# Patient Record
Sex: Male | Born: 1945
Health system: Southern US, Community
[De-identification: ages and names within clinical notes are randomized; demographics above are authoritative.]

## PROBLEM LIST (undated history)

## (undated) DIAGNOSIS — I10 Essential (primary) hypertension: Secondary | ICD-10-CM

## (undated) DIAGNOSIS — R079 Chest pain, unspecified: Secondary | ICD-10-CM

## (undated) DIAGNOSIS — E119 Type 2 diabetes mellitus without complications: Secondary | ICD-10-CM

## (undated) DIAGNOSIS — N529 Male erectile dysfunction, unspecified: Secondary | ICD-10-CM

## (undated) HISTORY — PX: ROTATOR CUFF REPAIR: SHX139

## (undated) HISTORY — DX: Essential (primary) hypertension: I10

## (undated) HISTORY — DX: Chest pain, unspecified: R07.9

## (undated) HISTORY — DX: Male erectile dysfunction, unspecified: N52.9

---
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
## 2011-11-28 DIAGNOSIS — E782 Mixed hyperlipidemia: Secondary | ICD-10-CM | POA: Insufficient documentation

## 2011-11-28 DIAGNOSIS — I10 Essential (primary) hypertension: Secondary | ICD-10-CM | POA: Insufficient documentation

## 2011-11-28 DIAGNOSIS — E1169 Type 2 diabetes mellitus with other specified complication: Secondary | ICD-10-CM | POA: Insufficient documentation

## 2011-11-28 DIAGNOSIS — N529 Male erectile dysfunction, unspecified: Secondary | ICD-10-CM | POA: Insufficient documentation

## 2011-11-28 DIAGNOSIS — E785 Hyperlipidemia, unspecified: Secondary | ICD-10-CM | POA: Insufficient documentation

## 2013-10-21 LAB — HM COLONOSCOPY

## 2013-10-21 LAB — FECAL OCCULT BLOOD, GUAIAC: FECAL OCCULT BLD: NEGATIVE

## 2014-02-04 DIAGNOSIS — Z9181 History of falling: Secondary | ICD-10-CM | POA: Diagnosis not present

## 2014-02-04 DIAGNOSIS — Z1389 Encounter for screening for other disorder: Secondary | ICD-10-CM | POA: Diagnosis not present

## 2014-02-04 DIAGNOSIS — E119 Type 2 diabetes mellitus without complications: Secondary | ICD-10-CM | POA: Diagnosis not present

## 2014-02-04 DIAGNOSIS — Z7189 Other specified counseling: Secondary | ICD-10-CM | POA: Diagnosis not present

## 2014-02-04 DIAGNOSIS — N529 Male erectile dysfunction, unspecified: Secondary | ICD-10-CM | POA: Diagnosis not present

## 2014-03-03 DIAGNOSIS — I10 Essential (primary) hypertension: Secondary | ICD-10-CM | POA: Diagnosis not present

## 2014-03-18 DIAGNOSIS — R079 Chest pain, unspecified: Secondary | ICD-10-CM | POA: Diagnosis not present

## 2014-03-18 DIAGNOSIS — E782 Mixed hyperlipidemia: Secondary | ICD-10-CM | POA: Diagnosis not present

## 2014-03-18 DIAGNOSIS — E119 Type 2 diabetes mellitus without complications: Secondary | ICD-10-CM | POA: Diagnosis not present

## 2014-03-18 DIAGNOSIS — I10 Essential (primary) hypertension: Secondary | ICD-10-CM | POA: Diagnosis not present

## 2014-03-18 LAB — HEMOGLOBIN A1C: HEMOGLOBIN A1C: 8.9 % — AB (ref 4.0–6.0)

## 2014-03-23 ENCOUNTER — Encounter: Payer: Self-pay | Admitting: *Deleted

## 2014-03-24 ENCOUNTER — Ambulatory Visit: Payer: Self-pay | Admitting: Cardiovascular Disease

## 2014-06-23 ENCOUNTER — Ambulatory Visit: Payer: Self-pay | Admitting: Family Medicine

## 2014-07-14 ENCOUNTER — Telehealth: Payer: Self-pay | Admitting: Family Medicine

## 2014-07-14 DIAGNOSIS — E11329 Type 2 diabetes mellitus with mild nonproliferative diabetic retinopathy without macular edema: Secondary | ICD-10-CM | POA: Diagnosis not present

## 2014-07-19 ENCOUNTER — Ambulatory Visit (INDEPENDENT_AMBULATORY_CARE_PROVIDER_SITE_OTHER): Payer: Commercial Managed Care - HMO | Admitting: Family Medicine

## 2014-07-19 ENCOUNTER — Encounter: Payer: Self-pay | Admitting: Family Medicine

## 2014-07-19 VITALS — BP 139/73 | HR 74 | Temp 98.7°F | Resp 16 | Ht 71.0 in | Wt 201.8 lb

## 2014-07-19 DIAGNOSIS — E1165 Type 2 diabetes mellitus with hyperglycemia: Secondary | ICD-10-CM

## 2014-07-19 DIAGNOSIS — E1169 Type 2 diabetes mellitus with other specified complication: Secondary | ICD-10-CM | POA: Insufficient documentation

## 2014-07-19 DIAGNOSIS — I10 Essential (primary) hypertension: Secondary | ICD-10-CM | POA: Diagnosis not present

## 2014-07-19 DIAGNOSIS — IMO0002 Reserved for concepts with insufficient information to code with codable children: Secondary | ICD-10-CM

## 2014-07-19 DIAGNOSIS — E11319 Type 2 diabetes mellitus with unspecified diabetic retinopathy without macular edema: Secondary | ICD-10-CM | POA: Insufficient documentation

## 2014-07-19 DIAGNOSIS — N528 Other male erectile dysfunction: Secondary | ICD-10-CM | POA: Diagnosis not present

## 2014-07-19 DIAGNOSIS — N529 Male erectile dysfunction, unspecified: Secondary | ICD-10-CM

## 2014-07-19 DIAGNOSIS — E785 Hyperlipidemia, unspecified: Secondary | ICD-10-CM

## 2014-07-19 LAB — POCT GLYCOSYLATED HEMOGLOBIN (HGB A1C): Hemoglobin A1C: 9.3

## 2014-07-19 MED ORDER — SILDENAFIL CITRATE 20 MG PO TABS: 20.0000 mg | ORAL_TABLET | Freq: Three times a day (TID) | ORAL | Status: DC

## 2014-07-19 MED ORDER — METFORMIN HCL 1000 MG PO TABS: 1000.0000 mg | ORAL_TABLET | Freq: Two times a day (BID) | ORAL | Status: DC

## 2014-07-19 MED ORDER — PRAVASTATIN SODIUM 10 MG PO TABS: 10.0000 mg | ORAL_TABLET | Freq: Every day | ORAL | Status: DC

## 2014-07-19 MED ORDER — LISINOPRIL-HYDROCHLOROTHIAZIDE 20-12.5 MG PO TABS: 1.0000 | ORAL_TABLET | Freq: Every day | ORAL | Status: DC

## 2014-07-19 MED ORDER — SITAGLIPTIN PHOSPHATE 100 MG PO TABS: 100.0000 mg | ORAL_TABLET | Freq: Every day | ORAL | Status: DC

## 2014-07-19 NOTE — Assessment & Plan Note (Signed)
Pt requesting generic sildenafil. Pt denies CP or shortness of breath. Pt is aware to alert HCP to viagra use if he experiences chest pain.

## 2014-07-19 NOTE — Assessment & Plan Note (Signed)
Check lipid panel today 

## 2014-07-19 NOTE — Progress Notes (Signed)
Subjective:    Patient ID: [name], male    DOB: 1945/09/04, 69 y.o.   MRN: [id number]  HPI: [name] is a 69 y.o. male presenting on 07/19/2014 for Diabetes; Hypertension; and Hyperlipidemia   Diabetes He presents for his follow-up diabetic visit. He has type 2 diabetes mellitus. His disease course has been worsening. There are no hypoglycemic associated symptoms. Pertinent negatives for hypoglycemia include no headaches. Pertinent negatives for diabetes include no blurred vision, no chest pain, no foot paresthesias, no polydipsia, no polyphagia, no polyuria and no visual change. There are no hypoglycemic complications. He is compliant with treatment most of the time. He has not had a previous visit with a dietitian. He monitors blood glucose at home 3-4 x per week. His overall blood glucose range is 140-180 mg/dl. An ACE inhibitor/angiotensin II receptor blocker is being taken. He does not see a podiatrist.Eye exam is current.  Hypertension This is a chronic problem. The problem is controlled. Pertinent negatives include no blurred vision, chest pain, headaches, palpitations, peripheral edema or shortness of breath. Risk factors for coronary artery disease include male gender, diabetes mellitus and dyslipidemia. Past treatments include ACE inhibitors and diuretics. The current treatment provides moderate improvement.  Hyperlipidemia This is a chronic problem. Exacerbating diseases include diabetes. Pertinent negatives include no chest pain, leg pain, myalgias or shortness of breath. Risk factors for coronary artery disease include diabetes mellitus.    Past Medical History  Diagnosis Date  . Hyperlipidemia   . Diabetes mellitus without complication   . Hypertension   . Right-sided chest pain   . Erectile dysfunction     No current outpatient prescriptions on file prior to visit.   No current facility-administered medications on file prior to visit.    Review of Systems   Constitutional: Negative for fever and chills.  Eyes: Negative for blurred vision.  Respiratory: Negative for chest tightness, shortness of breath and wheezing.   Cardiovascular: Negative for chest pain and palpitations.  Gastrointestinal: Negative.   Endocrine: Negative for cold intolerance, heat intolerance, polydipsia, polyphagia and polyuria.  Musculoskeletal: Negative for myalgias.  Neurological: Negative for light-headedness, numbness and headaches.  Psychiatric/Behavioral: Negative.    Per HPI unless specifically indicated above     Objective:    BP 139/73 mmHg  Pulse 74  Temp(Src) 98.7 F (37.1 C) (Oral)  Resp 16  Ht 5\' 11"  (1.803 m)  Wt 201 lb 12.8 oz (91.536 kg)  BMI 28.16 kg/m2  Wt Readings from Last 3 Encounters:  07/19/14 201 lb 12.8 oz (91.536 kg)  03/18/14 204 lb 8 oz (92.761 kg)    Physical Exam  Constitutional: He is oriented to person, place, and time. He appears well-developed and well-nourished. No distress.  Neck: Normal range of motion. Neck supple. No thyromegaly present.  Cardiovascular: Normal rate and regular rhythm.  Exam reveals no gallop and no friction rub.   No murmur heard. Pulmonary/Chest: Effort normal and breath sounds normal.  Abdominal: Soft. Bowel sounds are normal. There is no tenderness. There is no rebound.  Musculoskeletal: Normal range of motion. He exhibits no edema or tenderness.  Lymphadenopathy:    He has no cervical adenopathy.  Neurological: He is alert and oriented to person, place, and time.  Skin: Skin is warm and dry. He is not diaphoretic.   Diabetic Foot Exam - Simple   Simple Foot Form  Diabetic Foot exam was performed with the following findings:  Yes 07/19/2014  4:09 PM  Visual Inspection  No deformities, no ulcerations, no other skin breakdown bilaterally:  Yes  Sensation Testing  Intact to touch and monofilament testing bilaterally:  Yes  Pulse Check  Posterior Tibialis and Dorsalis pulse intact bilaterally:   Yes  Comments      Results for orders placed or performed in visit on 07/19/14  POCT HgB A1C  Result Value Ref Range   Hemoglobin A1C 9.3       Assessment & Plan:   Problem List Items Addressed This Visit      Cardiovascular and Mediastinum   BP (high blood pressure)    Controlled. Continue current regimen. DASH diet reviewed.       Relevant Medications   lisinopril-hydrochlorothiazide (PRINZIDE,ZESTORETIC) 20-12.5 MG per tablet   pravastatin (PRAVACHOL) 10 MG tablet   sildenafil (REVATIO) 20 MG tablet   Other Relevant Orders   Comprehensive Metabolic Panel (CMET)     Genitourinary   ED (erectile dysfunction) of organic origin    Pt requesting generic sildenafil. Pt denies CP or shortness of breath. Pt is aware to alert HCP to viagra use if he experiences chest pain.       Relevant Medications   sildenafil (REVATIO) 20 MG tablet     Other   HLD (hyperlipidemia)    Check lipid panel today.       Relevant Medications   lisinopril-hydrochlorothiazide (PRINZIDE,ZESTORETIC) 20-12.5 MG per tablet   pravastatin (PRAVACHOL) 10 MG tablet   sildenafil (REVATIO) 20 MG tablet   Other Relevant Orders   Lipid Profile   DM (diabetes mellitus), type 2, uncontrolled - Primary    Stop glimepiride, restart Januvia. Pt was controlled in the past. Check BG 2-3 times weekly to gain feedback on how sugars are doing. Encouraged continued lifestyle and diet modifications. Pt had eye exam last week- he will sign records release for the office.  Foot exam done. Check CMP and urine micro      Relevant Medications   lisinopril-hydrochlorothiazide (PRINZIDE,ZESTORETIC) 20-12.5 MG per tablet   metFORMIN (GLUCOPHAGE) 1000 MG tablet   pravastatin (PRAVACHOL) 10 MG tablet   sitaGLIPtin (JANUVIA) 100 MG tablet   Other Relevant Orders   POCT HgB A1C (Completed)   Comprehensive Metabolic Panel (CMET)   Urine Microalbumin w/creat. ratio      Meds ordered this encounter  Medications  .  DISCONTD: naproxen (NAPROSYN) 500 MG tablet    Sig: Take by mouth.  . selenium sulfide (SELSUN) 2.5 % shampoo    Sig: Apply topically.  [name] DISCONTD: glimepiride (AMARYL) 1 MG tablet    Sig: Take by mouth.  . DISCONTD: metFORMIN (GLUCOPHAGE) 1000 MG tablet    Sig: Take by mouth.  . DISCONTD: pravastatin (PRAVACHOL) 40 MG tablet    Sig: Take by mouth.  . DISCONTD: lisinopril-hydrochlorothiazide (PRINZIDE,ZESTORETIC) 10-12.5 MG per tablet    Sig: Take 1 tablet by mouth.  [name] lisinopril-hydrochlorothiazide (PRINZIDE,ZESTORETIC) 20-12.5 MG per tablet    Sig: Take 1 tablet by mouth daily.    Dispense:  90 tablet    Refill:  3    Order Specific Question:  Supervising Provider    Answer:  Janeann Forehand [phone]  . metFORMIN (GLUCOPHAGE) 1000 MG tablet    Sig: Take 1 tablet (1,000 mg total) by mouth 2 (two) times daily with a meal.    Dispense:  90 tablet    Refill:  3    Order Specific Question:  Supervising Provider    Answer:  Janeann Forehand [phone]  .  pravastatin (PRAVACHOL) 10 MG tablet    Sig: Take 1 tablet (10 mg total) by mouth daily.    Dispense:  90 tablet    Refill:  3    Order Specific Question:  Supervising Provider    Answer:  Janeann Forehand [phone]  . sitaGLIPtin (JANUVIA) 100 MG tablet    Sig: Take 1 tablet (100 mg total) by mouth daily.    Dispense:  90 tablet    Refill:  3    Order Specific Question:  Supervising Provider    Answer:  Janeann Forehand [phone]  . sildenafil (REVATIO) 20 MG tablet    Sig: Take 1 tablet (20 mg total) by mouth 3 (three) times daily. Take 1-5 tablets as needed 30 minutes prior to sex.    Dispense:  30 tablet    Refill:  11    Order Specific Question:  Supervising Provider    Answer:  Janeann Forehand [phone]      Follow up plan: Return in about 20 weeks (around 12/06/2014).

## 2014-07-19 NOTE — Assessment & Plan Note (Signed)
Controlled. Continue current regimen. DASH diet reviewed.

## 2014-07-19 NOTE — Patient Instructions (Signed)
Please check your blood glucose Once  daily. If your glucose is < 70 mg/dl or you have symptoms of hypoglycemia confusion, dizziness, jitteriness and sweating please drink 4 oz of juice or soda.  Check blood glucose 15 minutes later. If it has not risen to >100, please seek medical attention. If > 100 please eat a snack containing protein such as peanut butter and crackers.  Please make diet changes to control DM. Watch the carbs in your diet- reduce breads, rice, pasta. Avoid sugar sweetened beverages.  Your goal blood pressure is 140/90. Work on low salt/sodium diet - goal <1.5gm (1,500mg ) per day. Eat a diet high in fruits/vegetables and whole grains.  Look into mediterranean and DASH diet. Goal activity is 16750min/wk of moderate intensity exercise.  This can be split into 30 minute chunks.  If you are not at this level, you can start with smaller 10-15 min increments and slowly build up activity. Look at www.heart.org for more resources

## 2014-07-19 NOTE — Assessment & Plan Note (Signed)
Stop glimepiride, restart Januvia. Pt was controlled in the past. Check BG 2-3 times weekly to gain feedback on how sugars are doing. Encouraged continued lifestyle and diet modifications. Pt had eye exam last week- he will sign records release for the office.  Foot exam done. Check CMP and urine micro

## 2014-08-04 ENCOUNTER — Telehealth: Payer: Self-pay | Admitting: Family Medicine

## 2014-08-04 DIAGNOSIS — IMO0002 Reserved for concepts with insufficient information to code with codable children: Secondary | ICD-10-CM

## 2014-08-04 DIAGNOSIS — E1165 Type 2 diabetes mellitus with hyperglycemia: Secondary | ICD-10-CM

## 2014-08-04 MED ORDER — ONETOUCH DELICA LANCETS 33G MISC: 1.0000 | Freq: Three times a day (TID) | Status: DC

## 2014-08-04 MED ORDER — GLUCOSE BLOOD VI STRP: ORAL_STRIP | Status: DC

## 2014-08-04 NOTE — Telephone Encounter (Signed)
Called pt to inform him that strips and lancets were sent to his pharmacy. The Glimepiride  was d/c'ed at his last visit.

## 2014-11-02 NOTE — Telephone Encounter (Signed)
Error

## 2014-11-14 ENCOUNTER — Other Ambulatory Visit: Payer: Self-pay | Admitting: Family Medicine

## 2014-11-14 DIAGNOSIS — IMO0001 Reserved for inherently not codable concepts without codable children: Secondary | ICD-10-CM

## 2014-11-14 DIAGNOSIS — E1165 Type 2 diabetes mellitus with hyperglycemia: Principal | ICD-10-CM

## 2014-11-14 MED ORDER — METFORMIN HCL 1000 MG PO TABS: 1000.0000 mg | ORAL_TABLET | Freq: Two times a day (BID) | ORAL | Status: DC

## 2014-12-08 ENCOUNTER — Ambulatory Visit (INDEPENDENT_AMBULATORY_CARE_PROVIDER_SITE_OTHER): Payer: Commercial Managed Care - HMO | Admitting: Family Medicine

## 2014-12-08 ENCOUNTER — Encounter: Payer: Self-pay | Admitting: Family Medicine

## 2014-12-08 VITALS — BP 130/72 | HR 76 | Temp 98.2°F | Resp 16 | Ht 71.0 in | Wt 200.6 lb

## 2014-12-08 DIAGNOSIS — E119 Type 2 diabetes mellitus without complications: Secondary | ICD-10-CM | POA: Diagnosis not present

## 2014-12-08 DIAGNOSIS — E785 Hyperlipidemia, unspecified: Secondary | ICD-10-CM

## 2014-12-08 DIAGNOSIS — IMO0001 Reserved for inherently not codable concepts without codable children: Secondary | ICD-10-CM

## 2014-12-08 DIAGNOSIS — I10 Essential (primary) hypertension: Secondary | ICD-10-CM

## 2014-12-08 DIAGNOSIS — Z23 Encounter for immunization: Secondary | ICD-10-CM

## 2014-12-08 DIAGNOSIS — N528 Other male erectile dysfunction: Secondary | ICD-10-CM | POA: Diagnosis not present

## 2014-12-08 DIAGNOSIS — E1165 Type 2 diabetes mellitus with hyperglycemia: Secondary | ICD-10-CM | POA: Diagnosis not present

## 2014-12-08 DIAGNOSIS — E1169 Type 2 diabetes mellitus with other specified complication: Secondary | ICD-10-CM

## 2014-12-08 DIAGNOSIS — N529 Male erectile dysfunction, unspecified: Secondary | ICD-10-CM

## 2014-12-08 LAB — POCT GLYCOSYLATED HEMOGLOBIN (HGB A1C): HEMOGLOBIN A1C: 7.5

## 2014-12-08 MED ORDER — METFORMIN HCL 1000 MG PO TABS: 1000.0000 mg | ORAL_TABLET | Freq: Two times a day (BID) | ORAL | Status: DC

## 2014-12-08 NOTE — Assessment & Plan Note (Signed)
Controlled in office today. DASH diet reviewed.  CMP ordered. ACE for renal protection.  RTC 3 mos.

## 2014-12-08 NOTE — Assessment & Plan Note (Signed)
Continue sildenafil PRN. 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

## 2014-12-08 NOTE — Assessment & Plan Note (Signed)
Lipid panel ordered today.  Encouraged diet and lifestyle changes.

## 2014-12-08 NOTE — Patient Instructions (Signed)
Keep up the good work with your diabetes!  Your A1c is down a lot today. We will re-check in 3 mos.   Your goal blood pressure is 140/90 Work on low salt/sodium diet - goal <2.5gm (2,500mg ) per day. Eat a diet high in fruits/vegetables and whole grains.  Look into mediterranean and DASH diet. Goal activity is 14950min/wk of moderate intensity exercise.  This can be split into 30 minute chunks.  If you are not at this level, you can start with smaller 10-15 min increments and slowly build up activity. Look at www.heart.org for more resources

## 2014-12-08 NOTE — Assessment & Plan Note (Signed)
A1c decreased to 7.5%. Continue Januiva. Continue diet and lifestyle changes. Encouraged regular exercise.  Eye exam UTD. Foot Exam UTD.

## 2014-12-08 NOTE — Progress Notes (Signed)
Subjective:    Patient ID: [name], male    DOB: 09/03/1945, 69 y.o.   MRN: [id number]  HPI: [name] is a 69 y.o. male presenting on 12/08/2014 for Diabetes; Hyperlipidemia; and Hypertension   Diabetes He presents for his follow-up diabetic visit. He has type 2 diabetes mellitus. His disease course has been worsening. There are no hypoglycemic associated symptoms. Pertinent negatives for hypoglycemia include no headaches. Pertinent negatives for diabetes include no chest pain, no fatigue, no foot paresthesias, no polydipsia, no polyphagia, no polyuria and no weakness. There are no diabetic complications. He has not had a previous visit with a dietitian. He participates in exercise intermittently. His overall blood glucose range is 110-130 mg/dl. An ACE inhibitor/angiotensin II receptor blocker is being taken. He does not see a podiatrist.Eye exam is current.    Pt presents for DM follow-up.  CHanged from glimepiride to Januvia last visit for increasing A1c. Not really exercising. 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     Past Medical History  Diagnosis Date  . Hyperlipidemia   . Diabetes mellitus without complication (HCC)   . Hypertension   . Right-sided chest pain   . Erectile dysfunction     Current Outpatient Prescriptions on File Prior to Visit  Medication Sig  . glucose blood test strip Please check blood glucose twice daily  . lisinopril-hydrochlorothiazide (PRINZIDE,ZESTORETIC) 20-12.5 MG per tablet Take 1 tablet by mouth daily.  Letta Pate. ONETOUCH DELICA LANCETS 33G MISC 1 each by Does not apply route 3 (three) times daily.  . pravastatin (PRAVACHOL) 10 MG tablet Take 1 tablet (10 mg total) by mouth daily.  [name]. selenium sulfide (SELSUN) 2.5 % shampoo Apply topically.  . sildenafil (REVATIO) 20 MG tablet Take 1 tablet (20 mg total) by mouth 3 (three) times daily. Take 1-5 tablets as needed 30 minutes prior to sex.  . sitaGLIPtin (JANUVIA) 100 MG tablet Take 1 tablet (100 mg  total) by mouth daily.   No current facility-administered medications on file prior to visit.    Review of Systems  Constitutional: Negative for fever, chills and fatigue.  Respiratory: Negative for chest tightness, shortness of breath and wheezing.   Cardiovascular: Negative for chest pain.  Gastrointestinal: Negative.   Endocrine: Negative for cold intolerance, heat intolerance, polydipsia, polyphagia and polyuria.  Neurological: Negative for weakness, light-headedness, numbness and headaches.  Psychiatric/Behavioral: Negative.    Per HPI unless specifically indicated above     Objective:    BP 130/72 mmHg  Pulse 76  Temp(Src) 98.2 F (36.8 C) (Oral)  Resp 16  Ht 5\' 11"  (1.803 m)  Wt 200 lb 9.6 oz (90.992 kg)  BMI 27.99 kg/m2  Wt Readings from Last 3 Encounters:  12/08/14 200 lb 9.6 oz (90.992 kg)  07/19/14 201 lb 12.8 oz (91.536 kg)  03/18/14 204 lb 8 oz (92.761 kg)    Physical Exam  Constitutional: He is oriented to person, place, and time. He appears well-developed and well-nourished. No distress.  Neck: Normal range of motion. Neck supple. No thyromegaly present.  Cardiovascular: Normal rate and regular rhythm.  Exam reveals no gallop and no friction rub.   No murmur heard. Pulmonary/Chest: Effort normal and breath sounds normal.  Abdominal: Soft. Bowel sounds are normal. There is no tenderness. There is no rebound.  Musculoskeletal: Normal range of motion. He exhibits no edema or tenderness.  Lymphadenopathy:    He has no cervical adenopathy.  Neurological: He is alert and oriented to person, place, and time.  Skin: Skin is warm and dry. He is not diaphoretic.  Skin tag on upper bag. Benign. No bleeding or irritation.    Results for orders placed or performed in visit on 12/08/14  POCT HgB A1C  Result Value Ref Range   Hemoglobin A1C 7.5       Assessment & Plan:   Problem List Items Addressed This Visit      Cardiovascular and Mediastinum   BP (high  blood pressure)    Controlled in office today. DASH diet reviewed.  CMP ordered. ACE for renal protection.  RTC 3 mos.         Endocrine   DM (diabetes mellitus), type 2, uncontrolled (HCC)   Relevant Medications   metFORMIN (GLUCOPHAGE) 1000 MG tablet   Diabetes mellitus (HCC) - Primary    A1c decreased to 7.5%. Continue Januiva. Continue diet and lifestyle changes. Encouraged regular exercise.  Eye exam UTD. Foot Exam UTD.        Relevant Medications   metFORMIN (GLUCOPHAGE) 1000 MG tablet   Other Relevant Orders   Comprehensive metabolic panel   Lipid panel   POCT HgB A1C (Completed)     Genitourinary   ED (erectile dysfunction) of organic origin    Continue sildenafil PRN. 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         Other   HLD (hyperlipidemia)    Lipid panel ordered today.  Encouraged diet and lifestyle changes.        Other Visit Diagnoses    Need for Streptococcus pneumoniae vaccination        Relevant Orders    Pneumococcal conjugate vaccine 13-valent (Completed)       Meds ordered this encounter  Medications  . metFORMIN (GLUCOPHAGE) 1000 MG tablet    Sig: Take 1 tablet (1,000 mg total) by mouth 2 (two) times daily with a meal.    Dispense:  90 tablet    Refill:  3    Patient needs appt before any future refills.    Order Specific Question:  Supervising Provider    Answer:  Janeann Forehand [[id number]]      Follow up plan: Return in about 3 months (around 03/10/2015) for Diabetes.

## 2015-02-22 DIAGNOSIS — E119 Type 2 diabetes mellitus without complications: Secondary | ICD-10-CM | POA: Diagnosis not present

## 2015-02-22 LAB — HM DIABETES EYE EXAM

## 2015-03-16 ENCOUNTER — Ambulatory Visit: Payer: Commercial Managed Care - HMO | Admitting: Family Medicine

## 2015-04-04 ENCOUNTER — Encounter: Payer: Self-pay | Admitting: Family Medicine

## 2015-04-04 ENCOUNTER — Ambulatory Visit (INDEPENDENT_AMBULATORY_CARE_PROVIDER_SITE_OTHER): Payer: Commercial Managed Care - HMO | Admitting: Family Medicine

## 2015-04-04 VITALS — BP 140/76 | HR 68 | Temp 98.5°F | Resp 16 | Ht 71.0 in | Wt 197.0 lb

## 2015-04-04 DIAGNOSIS — E119 Type 2 diabetes mellitus without complications: Secondary | ICD-10-CM | POA: Diagnosis not present

## 2015-04-04 DIAGNOSIS — I10 Essential (primary) hypertension: Secondary | ICD-10-CM | POA: Diagnosis not present

## 2015-04-04 DIAGNOSIS — E785 Hyperlipidemia, unspecified: Secondary | ICD-10-CM

## 2015-04-04 DIAGNOSIS — Z125 Encounter for screening for malignant neoplasm of prostate: Secondary | ICD-10-CM

## 2015-04-04 LAB — POCT GLYCOSYLATED HEMOGLOBIN (HGB A1C): HEMOGLOBIN A1C: 7.8

## 2015-04-04 MED ORDER — GLIMEPIRIDE 1 MG PO TABS: 1.0000 mg | ORAL_TABLET | Freq: Every day | ORAL | Status: DC

## 2015-04-04 NOTE — Assessment & Plan Note (Signed)
-   Recheck lipid panel today

## 2015-04-04 NOTE — Progress Notes (Signed)
Subjective:    Patient ID: [name], male    DOB: October 10, 1945, 70 y.o.   MRN: [id number]  HPI: [name] is a 70 y.o. male presenting on 04/04/2015 for Diabetes   HPI  Pt present for diabetes follow-up.  Started [place] at last visit. Last A1c was 7.5%. Not checking sugars at home. Exercise- refs basketball. Diet has not changed. Eats lot of fish. Eats a little of everything. Tried to avoids sweet. Occasional alcohol and juice HTN: Denies chest pain, SOB, visual changes. Takes BP meds regularly. Does not check blood pressure.  Hyperlipidemia: Taking pravastatin. No chest pain. No muscle aches or cramping. Tries to adhere to heart healthy diet.  Pt desires to screen for prostate cancer today.   Past Medical History  Diagnosis Date  . Hyperlipidemia   . Diabetes mellitus without complication (HCC)   . Hypertension   . Right-sided chest pain   . Erectile dysfunction     Current Outpatient Prescriptions on File Prior to Visit  Medication Sig  . glucose blood test strip Please check blood glucose twice daily  . lisinopril-hydrochlorothiazide (PRINZIDE,ZESTORETIC) 20-12.5 MG per tablet Take 1 tablet by mouth daily.  . metFORMIN (GLUCOPHAGE) 1000 MG tablet Take 1 tablet (1,000 mg total) by mouth 2 (two) times daily with a meal.  . ONETOUCH DELICA LANCETS 33G MISC 1 each by Does not apply route 3 (three) times daily.  . pravastatin (PRAVACHOL) 10 MG tablet Take 1 tablet (10 mg total) by mouth daily.  [name]. selenium sulfide (SELSUN) 2.5 % shampoo Apply topically.  . sildenafil (REVATIO) 20 MG tablet Take 1 tablet (20 mg total) by mouth 3 (three) times daily. Take 1-5 tablets as needed 30 minutes prior to sex.  . sitaGLIPtin (JANUVIA) 100 MG tablet Take 1 tablet (100 mg total) by mouth daily.   No current facility-administered medications on file prior to visit.    Review of Systems  Constitutional: Negative for fever and chills.  HENT: Negative.   Respiratory: Negative for chest  tightness, shortness of breath and wheezing.   Cardiovascular: Negative for chest pain, palpitations and leg swelling.  Gastrointestinal: Negative for nausea, vomiting and abdominal pain.  Endocrine: Negative.  Negative for polydipsia, polyphagia and polyuria.  Genitourinary: Negative for dysuria, urgency, discharge, penile pain and testicular pain.  Musculoskeletal: Negative for back pain, joint swelling and arthralgias.  Skin: Negative.   Neurological: Negative for dizziness, weakness, numbness and headaches.  Psychiatric/Behavioral: Negative for sleep disturbance and dysphoric mood.   Per HPI unless specifically indicated above     Objective:    BP 140/76 mmHg  Pulse 68  Temp(Src) 98.5 F (36.9 C) (Oral)  Resp 16  Ht 5\' 11"  (1.803 m)  Wt 197 lb (89.359 kg)  BMI 27.49 kg/m2  Wt Readings from Last 3 Encounters:  04/04/15 197 lb (89.359 kg)  12/08/14 200 lb 9.6 oz (90.992 kg)  07/19/14 201 lb 12.8 oz (91.536 kg)    Physical Exam  Constitutional: He is oriented to person, place, and time. He appears well-developed and well-nourished. No distress.  HENT:  Head: Normocephalic and atraumatic.  Neck: Neck supple. No thyromegaly present.  Cardiovascular: Normal rate, regular rhythm and normal heart sounds.  Exam reveals no gallop and no friction rub.   No murmur heard. Pulmonary/Chest: Effort normal and breath sounds normal. He has no wheezes.  Abdominal: Soft. Bowel sounds are normal. He exhibits no distension. There is no tenderness. There is no rebound.  Musculoskeletal: Normal range of motion.  He exhibits no edema or tenderness.  Neurological: He is alert and oriented to person, place, and time. He has normal reflexes.  Skin: Skin is warm and dry. No rash noted. No erythema.  Sebaceous cyst on mid back. Non-tender or inflamed.   Psychiatric: He has a normal mood and affect. His behavior is normal. Thought content normal.   Results for orders placed or performed in visit on  04/04/15  POCT HgB A1C  Result Value Ref Range   Hemoglobin A1C 7.8       Assessment & Plan:   Problem List Items Addressed This Visit      Cardiovascular and Mediastinum   BP (high blood pressure)    Controlled continue current regimen. Pt encouraged to get his labwork done to monitor kidney function. Check CMET. On ACE for renal protection. Encouraged dash diet.       Relevant Orders   Comprehensive Metabolic Panel (CMET)     Endocrine   Diabetes mellitus (HCC) - Primary    A1c elevated from last visit. Add back glimepiride for better sugar control. Risks of hypoglycemia reviewed. Encouraged pt to check sugar at least 3 times weekly and monitor when he might feel low. Hypoglycemia protocol reviewed. Encouraged to keep juice/candy with him at all times. Recheck sugars 1 mos.  Foot exam due 3 mos. UA micro done. Eye exam UTD.       Relevant Medications   glimepiride (AMARYL) 1 MG tablet   Other Relevant Orders   POCT HgB A1C (Completed)   POCT UA - Microalbumin     Other   HLD (hyperlipidemia)    Recheck lipid panel today.       Relevant Orders   Lipid Profile    Other Visit Diagnoses    Screening for prostate cancer        Pt would like to screen for prostate cancer. Risks and benefits reviewed.     Relevant Orders    PSA       Meds ordered this encounter  Medications  . glimepiride (AMARYL) 1 MG tablet    Sig: Take 1 tablet (1 mg total) by mouth daily with breakfast.    Dispense:  90 tablet    Refill:  3    Order Specific Question:  Supervising Provider    Answer:  Janeann Forehand [[id number]]      Follow up plan: Return in about 4 weeks (around 05/02/2015) for sugar check.

## 2015-04-04 NOTE — Assessment & Plan Note (Signed)
A1c elevated from last visit. Add back glimepiride for better sugar control. Risks of hypoglycemia reviewed. Encouraged pt to check sugar at least 3 times weekly and monitor when he might feel low. Hypoglycemia protocol reviewed. Encouraged to keep juice/candy with him at all times. Recheck sugars 1 mos.  Foot exam due 3 mos. UA micro done. Eye exam UTD.

## 2015-04-04 NOTE — Patient Instructions (Addendum)
Diabetes- We will add back the glimepiride to see if we can get your A1c closer to 7.0%. This can cause some low blood sugars. Check your blood sugar at least 3 times per week and check if you feel you might be low. Be sure to take the glimepiride with a meal- preferably breakfast.   Avoid sugar sweetened beverages.  I would try to avoid alcohol as well.  Please check your blood glucose 3 times per week. If your glucose is < 70 mg/dl or you have symptoms of hypoglycemia headache, hunger, jitteriness and sweating please drink 4 oz of juice or soda.  Check blood glucose 15 minutes later. If it has not risen to >100, please seek medical attention. If > 100 please eat a snack containing protein such as peanut butter and crackers. 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

## 2015-04-04 NOTE — Assessment & Plan Note (Signed)
Controlled continue current regimen. Pt encouraged to get his labwork done to monitor kidney function. Check CMET. On ACE for renal protection. Encouraged dash diet.

## 2015-04-05 LAB — POCT UA - MICROALBUMIN
ALBUMIN/CREATININE RATIO, URINE, POC: 0
CREATININE, POC: 0 mg/dL
MICROALBUMIN (UR) POC: 0 mg/L

## 2015-05-10 DIAGNOSIS — E785 Hyperlipidemia, unspecified: Secondary | ICD-10-CM | POA: Diagnosis not present

## 2015-05-10 DIAGNOSIS — E1165 Type 2 diabetes mellitus with hyperglycemia: Secondary | ICD-10-CM | POA: Diagnosis not present

## 2015-05-10 DIAGNOSIS — E1142 Type 2 diabetes mellitus with diabetic polyneuropathy: Secondary | ICD-10-CM | POA: Diagnosis not present

## 2015-05-11 ENCOUNTER — Encounter: Payer: Self-pay | Admitting: Family Medicine

## 2015-05-11 ENCOUNTER — Ambulatory Visit (INDEPENDENT_AMBULATORY_CARE_PROVIDER_SITE_OTHER): Payer: Commercial Managed Care - HMO | Admitting: Family Medicine

## 2015-05-11 VITALS — BP 138/80 | HR 71 | Temp 98.7°F | Resp 16 | Ht 71.0 in | Wt 194.6 lb

## 2015-05-11 DIAGNOSIS — T24231A Burn of second degree of right lower leg, initial encounter: Secondary | ICD-10-CM | POA: Diagnosis not present

## 2015-05-11 DIAGNOSIS — E1165 Type 2 diabetes mellitus with hyperglycemia: Secondary | ICD-10-CM

## 2015-05-11 DIAGNOSIS — E1142 Type 2 diabetes mellitus with diabetic polyneuropathy: Secondary | ICD-10-CM

## 2015-05-11 DIAGNOSIS — IMO0002 Reserved for concepts with insufficient information to code with codable children: Secondary | ICD-10-CM

## 2015-05-11 LAB — COMPREHENSIVE METABOLIC PANEL
ALBUMIN: 4.2 g/dL (ref 3.6–4.8)
ALT: 17 IU/L (ref 0–44)
AST: 18 IU/L (ref 0–40)
Albumin/Globulin Ratio: 1.4 (ref 1.2–2.2)
Alkaline Phosphatase: 69 IU/L (ref 39–117)
BUN/Creatinine Ratio: 12 (ref 10–24)
BUN: 11 mg/dL (ref 8–27)
Bilirubin Total: 0.6 mg/dL (ref 0.0–1.2)
CO2: 23 mmol/L (ref 18–29)
Calcium: 9.4 mg/dL (ref 8.6–10.2)
Chloride: 100 mmol/L (ref 96–106)
Creatinine, Ser: 0.95 mg/dL (ref 0.76–1.27)
GFR, EST AFRICAN AMERICAN: 94 mL/min/{1.73_m2} (ref >59)
GFR, EST NON AFRICAN AMERICAN: 81 mL/min/{1.73_m2} (ref >59)
GLOBULIN, TOTAL: 3 g/dL (ref 1.5–4.5)
Glucose: 123 mg/dL — ABNORMAL HIGH (ref 65–99)
POTASSIUM: 4.3 mmol/L (ref 3.5–5.2)
SODIUM: 141 mmol/L (ref 134–144)
Total Protein: 7.2 g/dL (ref 6.0–8.5)

## 2015-05-11 LAB — LIPID PANEL
CHOL/HDL RATIO: 2 ratio (ref 0.0–5.0)
Cholesterol, Total: 204 mg/dL — ABNORMAL HIGH (ref 100–199)
HDL: 101 mg/dL (ref >39)
LDL Calculated: 88 mg/dL (ref 0–99)
Triglycerides: 74 mg/dL (ref 0–149)
VLDL Cholesterol Cal: 15 mg/dL (ref 5–40)

## 2015-05-11 MED ORDER — BACITRACIN 500 UNIT/GM EX OINT: 1.0000 "application " | TOPICAL_OINTMENT | Freq: Two times a day (BID) | CUTANEOUS | Status: DC

## 2015-05-11 MED ORDER — NAPROXEN 500 MG PO TABS: 500.0000 mg | ORAL_TABLET | Freq: Two times a day (BID) | ORAL | Status: DC

## 2015-05-11 NOTE — Patient Instructions (Addendum)
Change the dressing on your leg once daily. Apply bacitracin ointment to the gauze and lay flat on wound.   Apply vaseline gauze daily and wrap with Kerlex and ace bandage. Go to ER for increased redness, drainage, or swelling in the leg.   I want to see you back on Monday to take a look a your leg.

## 2015-05-11 NOTE — Assessment & Plan Note (Signed)
No evidence of hypoglycemia. Pt is not checking sugars regularly. Encouraged to do so. Reviewed S/s hypoglycemia.Encouraged pt to eat regular meals to help control sugars. Recheck 2 mos.
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
## 2015-05-15 ENCOUNTER — Ambulatory Visit (INDEPENDENT_AMBULATORY_CARE_PROVIDER_SITE_OTHER): Payer: Commercial Managed Care - HMO | Admitting: Family Medicine

## 2015-05-15 ENCOUNTER — Encounter: Payer: Self-pay | Admitting: Family Medicine

## 2015-05-15 VITALS — BP 138/85 | HR 62 | Temp 98.6°F | Resp 16 | Ht 71.0 in | Wt 194.0 lb

## 2015-05-15 DIAGNOSIS — T24231A Burn of second degree of right lower leg, initial encounter: Secondary | ICD-10-CM

## 2015-05-15 MED ORDER — SILVER SULFADIAZINE 1 % EX CREA: TOPICAL_CREAM | Freq: Once | CUTANEOUS | Status: DC
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
## 2015-05-15 NOTE — Patient Instructions (Signed)
Keep vaseline gauze on the wound. Apply cream to wound. Wrap in in gauze and keep cover. Call for spreading redness, fevers, foul drainage, or swelling of the leg.   The wound care center will give you a call to set up an appt to take a look at your leg.

## 2015-05-16 ENCOUNTER — Encounter: Payer: Self-pay | Admitting: Family Medicine

## 2015-05-16 ENCOUNTER — Other Ambulatory Visit: Payer: Self-pay | Admitting: Family Medicine

## 2015-05-16 DIAGNOSIS — IMO0002 Reserved for concepts with insufficient information to code with codable children: Secondary | ICD-10-CM

## 2015-05-16 DIAGNOSIS — E1165 Type 2 diabetes mellitus with hyperglycemia: Principal | ICD-10-CM

## 2015-05-16 DIAGNOSIS — E1142 Type 2 diabetes mellitus with diabetic polyneuropathy: Secondary | ICD-10-CM

## 2015-05-16 MED ORDER — SELENIUM SULFIDE 2.5 % EX LOTN: TOPICAL_LOTION | Freq: Every day | CUTANEOUS | Status: DC | PRN

## 2015-05-16 NOTE — Progress Notes (Signed)
FMLA forms completed

## 2015-05-19 ENCOUNTER — Telehealth: Payer: Self-pay | Admitting: Family Medicine

## 2015-05-19 NOTE — Telephone Encounter (Signed)
Wound  Clinic called  [phone]   Pt have appt  Monday at 8:00 am    DX: T24.231A  DR. Britto   NPI # [id number]

## 2015-05-19 NOTE — Telephone Encounter (Signed)
Approved. JH  

## 2015-05-22 ENCOUNTER — Encounter: Payer: Commercial Managed Care - HMO | Attending: Surgery | Admitting: Surgery

## 2015-05-22 DIAGNOSIS — Z79899 Other long term (current) drug therapy: Secondary | ICD-10-CM | POA: Diagnosis not present

## 2015-05-22 DIAGNOSIS — Z7984 Long term (current) use of oral hypoglycemic drugs: Secondary | ICD-10-CM | POA: Insufficient documentation

## 2015-05-22 DIAGNOSIS — I10 Essential (primary) hypertension: Secondary | ICD-10-CM | POA: Diagnosis not present

## 2015-05-22 DIAGNOSIS — T24331A Burn of third degree of right lower leg, initial encounter: Secondary | ICD-10-CM | POA: Diagnosis not present

## 2015-05-22 DIAGNOSIS — T24301A Burn of third degree of unspecified site of right lower limb, except ankle and foot, initial encounter: Secondary | ICD-10-CM | POA: Diagnosis not present

## 2015-05-22 DIAGNOSIS — E11622 Type 2 diabetes mellitus with other skin ulcer: Secondary | ICD-10-CM | POA: Diagnosis not present

## 2015-05-22 DIAGNOSIS — L97212 Non-pressure chronic ulcer of right calf with fat layer exposed: Secondary | ICD-10-CM | POA: Diagnosis not present
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
## 2015-05-22 NOTE — Progress Notes (Signed)
[name], [name] ([id number]) Visit Report for 05/22/2015 Abuse/Suicide Risk Screen Details Patient Name: [name], [name] Date of Service: 05/22/2015 8:00 AM Medical Record Number: [id number] Patient Account Number: [id number] Date of Birth/Sex: 04/26/1945 [age] y.o. Male) Treating RN: Curtis Sitesorthy, Joanna Primary Care Physician: Bjorn PippinKrebs, Amy Other Clinician: Referring Physician: Bjorn PippinKrebs, Amy Treating Physician/Extender: Rudene ReBritto, Errol Weeks in Treatment: 0 Abuse/Suicide Risk Screen Items Answer ABUSE/SUICIDE RISK SCREEN: Has anyone close to you tried to hurt or harm you recentlyo No Do you feel uncomfortable with anyone in your familyo No Has anyone forced you do things that you didnot want to doo No Do you have any thoughts of harming yourselfo No Patient displays signs or symptoms of abuse and/or neglect. No Electronic Signature(s) Signed: 05/22/2015 4:21:47 PM By: Curtis Sitesorthy, Joanna Entered By: Curtis Sitesorthy, Joanna on 05/22/2015 08:20:45 [name], [name] ([id number]) -------------------------------------------------------------------------------- Activities of Daily Living Details Patient Name: [name], [name] Date of Service: 05/22/2015 8:00 AM Medical Record Number: [id number] Patient Account Number: [id number] Date of Birth/Sex: 04/26/1945 [age] y.o. Male) Treating RN: Curtis Sitesorthy, Joanna Primary Care Physician: Bjorn PippinKrebs, Amy Other Clinician: Referring Physician: Bjorn PippinKrebs, Amy Treating Physician/Extender: Rudene ReBritto, Errol Weeks in Treatment: 0 Activities of Daily Living Items Answer Activities of Daily Living (Please select one for each item) Drive Automobile Completely Able Take Medications Completely Able Use Telephone Completely Able Care for Appearance Completely Able Use Toilet Completely Able Bath / Shower Completely Able Dress Self Completely Able Feed Self Completely Able Walk Completely Able Get In / Out Bed Completely Able Housework Completely Able Prepare Meals Completely Able Handle Money Completely  Able Shop for Self Completely Able Electronic Signature(s) Signed: 05/22/2015 4:21:47 PM By: Curtis Sitesorthy, Joanna Entered By: Curtis Sitesorthy, Joanna on 05/22/2015 08:20:59 [name], [name] ([id number]) -------------------------------------------------------------------------------- Education Assessment Details Patient Name: [name], [name] Date of Service: 05/22/2015 8:00 AM Medical Record Number: [id number] Patient Account Number: [id number] Date of Birth/Sex: 04/26/1945 [age] y.o. Male) Treating RN: Curtis Sitesorthy, Joanna Primary Care Physician: Bjorn PippinKrebs, Amy Other Clinician: Referring Physician: Bjorn PippinKrebs, Amy Treating Physician/Extender: Rudene ReBritto, Errol Weeks in Treatment: 0 Primary Learner Assessed: Patient Learning Preferences/Education Level/Primary Language Learning Preference: Explanation, Demonstration Highest Education Level: College or Above Preferred Language: English Cognitive Barrier Assessment/Beliefs Language Barrier: No Translator Needed: No Memory Deficit: No Emotional Barrier: No Cultural/Religious Beliefs Affecting Medical No Care: Physical Barrier Assessment Impaired Vision: No Impaired Hearing: No Decreased Hand dexterity: No Knowledge/Comprehension Assessment Knowledge Level: Medium Comprehension Level: Medium Ability to understand written Medium instructions: Ability to understand verbal Medium instructions: Motivation Assessment Anxiety Level: Calm Cooperation: Cooperative Education Importance: Acknowledges Need Interest in Health Problems: Asks Questions Perception: Coherent Willingness to Engage in Self- Medium Management Activities: Readiness to Engage in Self- Medium Management Activities: Electronic Signature(s) [name], [name] ([id number]) Signed: 05/22/2015 4:21:47 PM By: Curtis Sitesorthy, Joanna Entered By: Curtis Sitesorthy, Joanna on 05/22/2015 08:21:20 [name], [name] ([id number]) -------------------------------------------------------------------------------- Fall Risk Assessment  Details Patient Name: [name], Daymien Date of Service: 05/22/2015 8:00 AM Medical Record Number: [id number] Patient Account Number: [id number] Date of Birth/Sex: 04/26/1945 [age] y.o. Male) Treating RN: Curtis Sitesorthy, Joanna Primary Care Physician: Bjorn PippinKrebs, Amy Other Clinician: Referring Physician: Bjorn PippinKrebs, Amy Treating Physician/Extender: Rudene ReBritto, Errol Weeks in Treatment: 0 Fall Risk Assessment Items Have you had 2 or more falls in the last 12 monthso 0 No Have you had any fall that resulted in injury in the last 12 monthso 0 No FALL RISK ASSESSMENT: History of falling - immediate or within 3 months 0 No Secondary diagnosis 0 No Ambulatory aid None/bed rest/wheelchair/nurse 0 Yes Crutches/cane/walker 0 No Furniture 0 No IV Access/Saline Lock 0 No Gait/Training Normal/bed  rest/immobile 0 Yes Weak 0 No Impaired 0 No Mental Status Oriented to own ability 0 Yes Electronic Signature(s) Signed: 05/22/2015 4:21:47 PM By: Curtis Sites Entered By: Curtis Sites on 05/22/2015 08:21:34 [name] ([id number]) -------------------------------------------------------------------------------- Foot Assessment Details Patient Name: [name] Date of Service: 05/22/2015 8:00 AM Medical Record Number: [id number] Patient Account Number: [id number] Date of Birth/Sex: Oct 08, 1945 (70 y.o. Male) Treating RN: Curtis Sites Primary Care Physician: Bjorn Pippin Other Clinician: Referring Physician: Bjorn Pippin Treating Physician/Extender: Rudene Re in Treatment: 0 Foot Assessment Items Site Locations + = Sensation present, - = Sensation absent, C = Callus, U = Ulcer R = Redness, W = Warmth, M = Maceration, PU = Pre-ulcerative lesion F = Fissure, S = Swelling, D = Dryness Assessment Right: Left: Other Deformity: No No Prior Foot Ulcer: No No Prior Amputation: No No Charcot Joint: No No Ambulatory Status: Ambulatory Without Help Gait: Steady Electronic Signature(s) Signed: 05/22/2015 4:21:47 PM  By: Curtis Sites Entered By: Curtis Sites on 05/22/2015 08:21:58 [name] ([id number]) -------------------------------------------------------------------------------- Nutrition Risk Assessment Details Patient Name: [name] Date of Service: 05/22/2015 8:00 AM Medical Record Number: [id number] Patient Account Number: [id number] Date of Birth/Sex: [birth year] (70 y.o. Male) Treating RN: Curtis Sites Primary Care Physician: Bjorn Pippin Other Clinician: Referring Physician: Bjorn Pippin Treating Physician/Extender: Rudene Re in Treatment: 0 Height (in): Weight (lbs): Body Mass Index (BMI): Nutrition Risk Assessment Items NUTRITION RISK SCREEN: I have an illness or condition that made me change the kind and/or 0 No amount of food I eat I eat fewer than two meals per day 0 No I eat few fruits and vegetables, or milk products 0 No I have three or more drinks of beer, liquor or wine almost every day 0 No I have tooth or mouth problems that make it hard for me to eat 0 No I don't always have enough money to buy the food I need 0 No I eat alone most of the time 0 No I take three or more different prescribed or over-the-counter drugs a 1 Yes day Without wanting to, I have lost or gained 10 pounds in the last six 0 No months I am not always physically able to shop, cook and/or feed myself 0 No Nutrition Protocols Good Risk Protocol 0 No interventions needed Moderate Risk Protocol Electronic Signature(s) Signed: 05/22/2015 4:21:47 PM By: Curtis Sites Entered By: Curtis Sites on 05/22/2015 08:21:44

## 2015-05-25 ENCOUNTER — Telehealth: Payer: Self-pay | Admitting: Family Medicine

## 2015-05-25 MED ORDER — IBUPROFEN 600 MG PO TABS: 600.0000 mg | ORAL_TABLET | Freq: Three times a day (TID) | ORAL | Status: DC | PRN

## 2015-05-25 NOTE — Telephone Encounter (Signed)
I don't know what MG is?  Please call to determine what patient needs. Thank you!
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
## 2015-05-25 NOTE — Telephone Encounter (Signed)
As per Fleet Contrasachel MG is mg (miligrams).

## 2015-05-25 NOTE — Telephone Encounter (Signed)
Pt stopped by states that the medicine naproxen ws not helping for the pain he wanted to know if you would increase the MG. Pt call back # is [phone]

## 2015-05-26 NOTE — Telephone Encounter (Signed)
Patient picked up Ibuprofen on yesterday. He did go to wound care appointment this week, and has another one on Monday of next week. Thanks.

## 2015-05-26 NOTE — Telephone Encounter (Signed)
Left message for patient to return call.

## 2015-05-29 ENCOUNTER — Encounter: Payer: Commercial Managed Care - HMO | Admitting: Surgery

## 2015-05-29 DIAGNOSIS — T24301A Burn of third degree of unspecified site of right lower limb, except ankle and foot, initial encounter: Secondary | ICD-10-CM | POA: Diagnosis not present

## 2015-05-29 DIAGNOSIS — E11622 Type 2 diabetes mellitus with other skin ulcer: Secondary | ICD-10-CM | POA: Diagnosis not present

## 2015-05-29 DIAGNOSIS — L97211 Non-pressure chronic ulcer of right calf limited to breakdown of skin: Secondary | ICD-10-CM | POA: Diagnosis not present

## 2015-05-29 DIAGNOSIS — I10 Essential (primary) hypertension: Secondary | ICD-10-CM | POA: Diagnosis not present

## 2015-05-29 DIAGNOSIS — L97212 Non-pressure chronic ulcer of right calf with fat layer exposed: Secondary | ICD-10-CM | POA: Diagnosis not present

## 2015-05-29 DIAGNOSIS — Z7984 Long term (current) use of oral hypoglycemic drugs: Secondary | ICD-10-CM | POA: Diagnosis not present

## 2015-05-29 DIAGNOSIS — Z79899 Other long term (current) drug therapy: Secondary | ICD-10-CM | POA: Diagnosis not present

## 2015-05-29 NOTE — Progress Notes (Signed)
[name], [name] ([id number]) Visit Report for 05/29/2015 Arrival Information Details Patient Name: [name], [name] Date of Service: 05/29/2015 2:15 PM Medical Record Number: [id number] Patient Account Number: [id number] Date of Birth/Sex: [birth year](70 y.o. Male) Treating RN: Leonard Downing[place], Sendra Primary Care Physician: Bjorn PippinKrebs, Amy Other Clinician: Referring Physician: Bjorn PippinKrebs, Amy Treating Physician/Extender: Rudene ReBritto, Errol Weeks in Treatment: 1 Visit Information History Since Last Visit All ordered tests and consults were completed: No Patient Arrived: Ambulatory Added or deleted any medications: No Arrival Time: 14:11 Any new allergies or adverse reactions: No Accompanied By: self Had a fall or experienced change in No Transfer Assistance: None activities of daily living that may affect Patient Identification Verified: Yes risk of falls: Secondary Verification Process Yes Signs or symptoms of abuse/neglect since last No Completed: visito Patient Has Alerts: Yes Hospitalized since last visit: No Patient Alerts: DMII Has Dressing in Place as Prescribed: Yes Pain Present Now: Yes Electronic Signature(s) Signed: 05/29/2015 3:24:57 PM By: Lucrezia Starch[place], RN, Sendra Entered By: Lucrezia [place]arch[place], RN, Sendra on 05/29/2015 14:12:14 [name], [name] ([id number]) -------------------------------------------------------------------------------- Clinic Level of Care Assessment Details Patient Name: [name], [name] Date of Service: 05/29/2015 2:15 PM Medical Record Number: [id number] Patient Account Number: [id number] Date of Birth/Sex: [birth year](70 y.o. Male) Treating RN: Leonard Downing[place], Sendra Primary Care Physician: Bjorn PippinKrebs, Amy Other Clinician: Referring Physician: Bjorn PippinKrebs, Amy Treating Physician/Extender: Rudene ReBritto, Errol Weeks in Treatment: 1 Clinic Level of Care Assessment Items TOOL 4 Quantity Score X - Use when only an EandM is performed on FOLLOW-UP visit 1 0 ASSESSMENTS - Nursing Assessment / Reassessment X -  Reassessment of Co-morbidities (includes updates in patient status) 1 10 X - Reassessment of Adherence to Treatment Plan 1 5 ASSESSMENTS - Wound and Skin Assessment / Reassessment []  - Simple Wound Assessment / Reassessment - one wound 0 X - Complex Wound Assessment / Reassessment - multiple wounds 2 5 []  - Dermatologic / Skin Assessment (not related to wound area) 0 ASSESSMENTS - Focused Assessment []  - Circumferential Edema Measurements - multi extremities 0 []  - Nutritional Assessment / Counseling / Intervention 0 X - Lower Extremity Assessment (monofilament, tuning fork, pulses) 1 5 []  - Peripheral Arterial Disease Assessment (using hand held doppler) 0 ASSESSMENTS - Ostomy and/or Continence Assessment and Care []  - Incontinence Assessment and Management 0 []  - Ostomy Care Assessment and Management (repouching, etc.) 0 PROCESS - Coordination of Care X - Simple Patient / Family Education for ongoing care 1 15 []  - Complex (extensive) Patient / Family Education for ongoing care 0 X - Staff obtains ChiropractorConsents, Records, Test Results / Process Orders 1 10 []  - Staff telephones HHA, Nursing Homes / Clarify orders / etc 0 []  - Routine Transfer to another Facility (non-emergent condition) 0 [name], [name] ([id number]) []  - Routine Hospital Admission (non-emergent condition) 0 []  - New Admissions / Manufacturing engineernsurance Authorizations / Ordering NPWT, Apligraf, etc. 0 []  - Emergency Hospital Admission (emergent condition) 0 X - Simple Discharge Coordination 1 10 []  - Complex (extensive) Discharge Coordination 0 PROCESS - Special Needs []  - Pediatric / Minor Patient Management 0 []  - Isolation Patient Management 0 []  - Hearing / Language / Visual special needs 0 []  - Assessment of Community assistance (transportation, D/C planning, etc.) 0 []  - Additional assistance / Altered mentation 0 []  - Support Surface(s) Assessment (bed, cushion, seat, etc.) 0 INTERVENTIONS - Wound Cleansing / Measurement []  -  Simple Wound Cleansing - one wound 0 X - Complex Wound Cleansing - multiple wounds 2 5 X - Wound Imaging (photographs - any number of  wounds) 1 5 []  - Wound Tracing (instead of photographs) 0 []  - Simple Wound Measurement - one wound 0 X - Complex Wound Measurement - multiple wounds 2 5 INTERVENTIONS - Wound Dressings X - Small Wound Dressing one or multiple wounds 2 10 []  - Medium Wound Dressing one or multiple wounds 0 []  - Large Wound Dressing one or multiple wounds 0 X - Application of Medications - topical 1 5 []  - Application of Medications - injection 0 INTERVENTIONS - Miscellaneous []  - External ear exam 0 [name], [name] ([id number]) []  - Specimen Collection (cultures, biopsies, blood, body fluids, etc.) 0 []  - Specimen(s) / Culture(s) sent or taken to Lab for analysis 0 []  - Patient Transfer (multiple staff / Michiel Sites Lift / Similar devices) 0 []  - Simple Staple / Suture removal (25 or less) 0 []  - Complex Staple / Suture removal (26 or more) 0 []  - Hypo / Hyperglycemic Management (close monitor of Blood Glucose) 0 []  - Ankle / Brachial Index (ABI) - do not check if billed separately 0 X - Vital Signs 1 5 Has the patient been seen at the hospital within the last three years: Yes Total Score: 120 Level Of Care: New/Established - Level 4 Electronic Signature(s) Signed: 05/29/2015 3:24:57 PM By: Lucrezia Starch, RN, Sendra Entered By: Lucrezia Starch RN, Sendra on 05/29/2015 14:59:40 [name] ([id number]) -------------------------------------------------------------------------------- Encounter Discharge Information Details Patient Name: [name] Date of Service: 05/29/2015 2:15 PM Medical Record Number: [id number] Patient Account Number: [id number] Date of Birth/Sex: 1945-05-07 (70 y.o. Male) Treating RN: Leonard Downing Primary Care Physician: Bjorn Pippin Other Clinician: Referring Physician: Bjorn Pippin Treating Physician/Extender: Rudene Re in Treatment: 1 Encounter  Discharge Information Items Discharge Pain Level: 4 Discharge Condition: Stable Ambulatory Status: Ambulatory Discharge Destination: Home Transportation: Private Auto Accompanied By: self Schedule Follow-up Appointment: Yes Medication Reconciliation completed and provided to Patient/Care Yes [name]: Provided on Clinical Summary of Care: 05/29/2015 Form Type Recipient Paper Patient RM Electronic Signature(s) Signed: 05/29/2015 2:39:09 PM By: Gwenlyn Perking Entered By: Gwenlyn Perking on 05/29/2015 14:39:09 [name] ([id number]) -------------------------------------------------------------------------------- Lower Extremity Assessment Details Patient Name: [name] Date of Service: 05/29/2015 2:15 PM Medical Record Number: [id number] Patient Account Number: [id number] Date of Birth/Sex: [birth year] (70 y.o. Male) Treating RN: Leonard Downing Primary Care Physician: Bjorn Pippin Other Clinician: Referring Physician: Bjorn Pippin Treating Physician/Extender: Rudene Re in Treatment: 1 Edema Assessment Assessed: [Left: No] [Right: No] Edema: [Left: N] [Right: o] Vascular Assessment Pulses: Posterior Tibial Dorsalis Pedis Palpable: [Right:Yes] Extremity colors, hair growth, and conditions: Extremity Color: [Right:Normal] Hair Growth on Extremity: [Right:Yes] Temperature of Extremity: [Right:Warm] Capillary Refill: [Right:< 3 seconds] Dependent Rubor: [Right:No] Blanched when Elevated: [Right:No] Lipodermatosclerosis: [Right:No] Toe Nail Assessment Left: Right: Thick: Yes Discolored: No Deformed: No Improper Length and Hygiene: No Electronic Signature(s) Signed: 05/29/2015 3:24:57 PM By: Lucrezia Starch, RN, Sendra Entered By: Lucrezia Starch RN, Sendra on 05/29/2015 14:26:30 [name] ([id number]) -------------------------------------------------------------------------------- Pain Assessment Details Patient Name: [name] Date of Service: 05/29/2015 2:15  PM Medical Record Number: [id number] Patient Account Number: [id number] Date of Birth/Sex: 1946/01/08 (70 y.o. Male) Treating RN: Leonard Downing Primary Care Physician: Bjorn Pippin Other Clinician: Referring Physician: Bjorn Pippin Treating Physician/Extender: Rudene Re in Treatment: 1 Active Problems Location of Pain Severity and Description of Pain Patient Has Paino Yes Site Locations Pain Location: Pain in Ulcers Rate the pain. Current Pain Level: 5 Pain Management and Medication Current Pain Management: Electronic Signature(s) Signed: 05/29/2015 3:24:57 PM By: Lucrezia Starch, RN, Sendra Entered By: Lucrezia Starch RN, Sendra on  05/29/2015 14:12:33 [name], [name] ([id number]) -------------------------------------------------------------------------------- Patient/Caregiver Education Details Patient Name: [name], [name] Date of Service: 05/29/2015 2:15 PM Medical Record Number: [id number] Patient Account Number: [id number] Date of Birth/Gender: Apr 01, 1945 (70 y.o. Male) Treating RN: Leonard Downing Primary Care Physician: Bjorn Pippin Other Clinician: Referring Physician: Bjorn Pippin Treating Physician/Extender: Rudene Re in Treatment: 1 Education Assessment Education Provided To: Patient Education Topics Provided Wound/Skin Impairment: Handouts: Caring for Your Ulcer, Skin Care Do's and Dont's Methods: Explain/Verbal Responses: State content correctly Electronic Signature(s) Signed: 05/29/2015 3:24:57 PM By: Lucrezia Starch, RN, Sendra Entered By: Lucrezia Starch RN, Sendra on 05/29/2015 14:32:50 [name] ([id number]) -------------------------------------------------------------------------------- Wound Assessment Details Patient Name: [name] Date of Service: 05/29/2015 2:15 PM Medical Record Number: [id number] Patient Account Number: [id number] Date of Birth/Sex: [birth year] (70 y.o. Male) Treating RN: Leonard Downing Primary Care Physician: Bjorn Pippin Other  Clinician: Referring Physician: Bjorn Pippin Treating Physician/Extender: Rudene Re in Treatment: 1 Wound Status Wound Number: 1 Primary Etiology: 3rd degree Burn Wound Location: Right Lower Leg - Medial, Wound Status: Open Proximal Comorbid History: Hypertension, Type II Diabetes Wounding Event: Thermal Burn Date Acquired: 05/06/2015 Weeks Of Treatment: 1 Clustered Wound: No Wound Measurements Length: (cm) 2 Width: (cm) 2 Depth: (cm) 0.1 Area: (cm) 3.142 Volume: (cm) 0.314 % Reduction in Area: 44.8% % Reduction in Volume: 44.8% Epithelialization: Medium (34-66%) Tunneling: No Undermining: No Wound Description Full Thickness Without Classification: Exposed Support Structures Diabetic Severity Grade 1 (Wagner): Wound Margin: Flat and Intact Exudate Amount: Medium Exudate Type: Serous Exudate Color: amber Foul Odor After Cleansing: No Wound Bed Granulation Amount: Large (67-100%) Exposed Structure Granulation Quality: Red, Pink Fascia Exposed: No Necrotic Amount: Small (1-33%) Fat Layer Exposed: No Necrotic Quality: Adherent Slough Tendon Exposed: No Muscle Exposed: No Joint Exposed: No Bone Exposed: No Limited to Skin Breakdown Periwound Skin Texture Texture Color No Abnormalities Noted: No No Abnormalities Noted: No [name], [name] ([id number]) Callus: No Atrophie Blanche: No Crepitus: No Cyanosis: No Excoriation: No Ecchymosis: No Fluctuance: No Erythema: No Friable: No Hemosiderin Staining: No Induration: No Mottled: No Localized Edema: No Pallor: No Rash: No Rubor: No Scarring: No Temperature / Pain Moisture Tenderness on Palpation: Yes No Abnormalities Noted: No Dry / Scaly: No Maceration: No Moist: No Wound Preparation Ulcer Cleansing: Rinsed/Irrigated with Saline Topical Anesthetic Applied: Other: lidocaine 4%, Treatment Notes Wound #1 (Right, Proximal, Medial Lower Leg) 1. Cleansed with: Clean wound with Normal  Saline 2. Anesthetic Topical Lidocaine 4% cream to wound bed prior to debridement 4. Dressing Applied: Prisma Ag 5. Secondary Dressing Applied Gauze and Kerlix/Conform Electronic Signature(s) Signed: 05/29/2015 3:24:57 PM By: Lucrezia Starch, RN, Sendra Entered By: Lucrezia Starch RN, Sendra on 05/29/2015 14:19:56 [name], [name] ([id number]) -------------------------------------------------------------------------------- Wound Assessment Details Patient Name: [name] Date of Service: 05/29/2015 2:15 PM Medical Record Number: [id number] Patient Account Number: [id number] Date of Birth/Sex: November 24, 1945 (70 y.o. Male) Treating RN: Leonard Downing Primary Care Physician: Bjorn Pippin Other Clinician: Referring Physician: Bjorn Pippin Treating Physician/Extender: Rudene Re in Treatment: 1 Wound Status Wound Number: 2 Primary Etiology: 3rd degree Burn Wound Location: Right Lower Leg - Medial, Wound Status: Open Distal Comorbid History: Hypertension, Type II Diabetes Wounding Event: Thermal Burn Date Acquired: 05/06/2015 Weeks Of Treatment: 1 Clustered Wound: No Wound Measurements Length: (cm) 10 Width: (cm) 2 Depth: (cm) 0.1 Area: (cm) 15.708 Volume: (cm) 1.571 % Reduction in Area: -16.5% % Reduction in Volume: -16.5% Epithelialization: Small (1-33%) Tunneling: No Undermining: No Wound Description Full Thickness Without Classification: Exposed Support Structures Diabetic Severity Grade 1 (Wagner): Wound  Margin: Flat and Intact Exudate Amount: Medium Exudate Type: Serous Exudate Color: amber Foul Odor After Cleansing: No Wound Bed Granulation Amount: Large (67-100%) Exposed Structure Granulation Quality: Red, Pink Fascia Exposed: No Necrotic Amount: Small (1-33%) Fat Layer Exposed: No Necrotic Quality: Adherent Slough Tendon Exposed: No Muscle Exposed: No Joint Exposed: No Bone Exposed: No Limited to Skin Breakdown Periwound Skin Texture Texture Color No  Abnormalities Noted: No No Abnormalities Noted: No [name], SLINEY ([id number]) Callus: No Atrophie Blanche: No Crepitus: No Cyanosis: No Excoriation: No Ecchymosis: No Fluctuance: No Erythema: No Friable: No Hemosiderin Staining: No Induration: No Mottled: No Localized Edema: No Pallor: No Rash: No Rubor: No Scarring: No Temperature / Pain Moisture Tenderness on Palpation: Yes No Abnormalities Noted: No Dry / Scaly: No Maceration: No Moist: Yes Wound Preparation Ulcer Cleansing: Rinsed/Irrigated with Saline Topical Anesthetic Applied: Other: lidocaine 4%, Treatment Notes Wound #2 (Right, Distal, Medial Lower Leg) 1. Cleansed with: Clean wound with Normal Saline 2. Anesthetic Topical Lidocaine 4% cream to wound bed prior to debridement 4. Dressing Applied: Prisma Ag 5. Secondary Dressing Applied Gauze and Kerlix/Conform Electronic Signature(s) Signed: 05/29/2015 3:24:57 PM By: Lucrezia Starch, RN, Sendra Entered By: Lucrezia Starch RN, Sendra on 05/29/2015 14:21:11 DESTIN, VINSANT ([id number]) -------------------------------------------------------------------------------- Vitals Details Patient Name: [name] Date of Service: 05/29/2015 2:15 PM Medical Record Number: [id number] Patient Account Number: [id number] Date of Birth/Sex: 06-09-1945 (70 y.o. Male) Treating RN: Leonard Downing Primary Care Physician: Bjorn Pippin Other Clinician: Referring Physician: Bjorn Pippin Treating Physician/Extender: Rudene Re in Treatment: 1 Vital Signs Time Taken: 14:15 Temperature (F): 98.1 Height (in): 71 Pulse (bpm): 63 Weight (lbs): 194 Blood Pressure (mmHg): 130/64 Body Mass Index (BMI): 27.1 Reference Range: 80 - 120 mg / dl Electronic Signature(s) Signed: 05/29/2015 3:24:57 PM By: Lucrezia Starch RN, Sendra Entered By: Lucrezia Starch RN, Sendra on 05/29/2015 14:14:58
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
## 2015-06-05 ENCOUNTER — Encounter: Payer: Commercial Managed Care - HMO | Admitting: Surgery

## 2015-06-05 DIAGNOSIS — L97211 Non-pressure chronic ulcer of right calf limited to breakdown of skin: Secondary | ICD-10-CM | POA: Diagnosis not present

## 2015-06-05 DIAGNOSIS — I10 Essential (primary) hypertension: Secondary | ICD-10-CM | POA: Diagnosis not present

## 2015-06-05 DIAGNOSIS — Z7984 Long term (current) use of oral hypoglycemic drugs: Secondary | ICD-10-CM | POA: Diagnosis not present

## 2015-06-05 DIAGNOSIS — Z79899 Other long term (current) drug therapy: Secondary | ICD-10-CM | POA: Diagnosis not present

## 2015-06-05 DIAGNOSIS — L97212 Non-pressure chronic ulcer of right calf with fat layer exposed: Secondary | ICD-10-CM | POA: Diagnosis not present

## 2015-06-05 DIAGNOSIS — E11622 Type 2 diabetes mellitus with other skin ulcer: Secondary | ICD-10-CM | POA: Diagnosis not present
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
## 2015-06-12 ENCOUNTER — Encounter: Payer: Commercial Managed Care - HMO | Admitting: Surgery

## 2015-06-12 DIAGNOSIS — Z7984 Long term (current) use of oral hypoglycemic drugs: Secondary | ICD-10-CM | POA: Diagnosis not present

## 2015-06-12 DIAGNOSIS — T24331A Burn of third degree of right lower leg, initial encounter: Secondary | ICD-10-CM | POA: Diagnosis not present

## 2015-06-12 DIAGNOSIS — E11622 Type 2 diabetes mellitus with other skin ulcer: Secondary | ICD-10-CM | POA: Diagnosis not present

## 2015-06-12 DIAGNOSIS — L97212 Non-pressure chronic ulcer of right calf with fat layer exposed: Secondary | ICD-10-CM | POA: Diagnosis not present

## 2015-06-12 DIAGNOSIS — I10 Essential (primary) hypertension: Secondary | ICD-10-CM | POA: Diagnosis not present

## 2015-06-12 DIAGNOSIS — Z79899 Other long term (current) drug therapy: Secondary | ICD-10-CM | POA: Diagnosis not present
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
## 2015-06-21 ENCOUNTER — Telehealth: Payer: Self-pay | Admitting: Family Medicine

## 2015-06-21 NOTE — Telephone Encounter (Signed)
Pt called requesting  Refill on ibuprofen 600 pt  Call back # is  [phone]

## 2015-06-22 MED ORDER — IBUPROFEN 600 MG PO TABS: 600.0000 mg | ORAL_TABLET | Freq: Three times a day (TID) | ORAL | Status: DC | PRN

## 2015-06-22 NOTE — Telephone Encounter (Signed)
Sent to pharmacy on file 

## 2015-06-23 ENCOUNTER — Encounter: Payer: Commercial Managed Care - HMO | Attending: Surgery | Admitting: Surgery

## 2015-06-23 DIAGNOSIS — Z7984 Long term (current) use of oral hypoglycemic drugs: Secondary | ICD-10-CM | POA: Insufficient documentation

## 2015-06-23 DIAGNOSIS — T24331D Burn of third degree of right lower leg, subsequent encounter: Secondary | ICD-10-CM | POA: Diagnosis not present

## 2015-06-23 DIAGNOSIS — Z79899 Other long term (current) drug therapy: Secondary | ICD-10-CM | POA: Insufficient documentation

## 2015-06-23 DIAGNOSIS — I10 Essential (primary) hypertension: Secondary | ICD-10-CM | POA: Diagnosis not present

## 2015-06-23 DIAGNOSIS — E11622 Type 2 diabetes mellitus with other skin ulcer: Secondary | ICD-10-CM | POA: Insufficient documentation

## 2015-06-23 DIAGNOSIS — L97212 Non-pressure chronic ulcer of right calf with fat layer exposed: Secondary | ICD-10-CM | POA: Insufficient documentation

## 2015-07-03 ENCOUNTER — Ambulatory Visit: Payer: Commercial Managed Care - HMO | Admitting: Surgery

## 2015-09-19 NOTE — Progress Notes (Signed)
[name] ([id number]) Visit Report for 06/23/2015 Arrival Information Details Patient Name: [name] Date of Service: 06/23/2015 2:15 PM Medical Record Number: [id number] Patient Account Number: [id number] Date of Birth/Sex: Jul 09, 1945 (70 y.o. Male) Treating RN: Huel Coventry Primary Care Physician: Bjorn Pippin Other Clinician: Referring Physician: Bjorn Pippin Treating Physician/Extender: Rudene Re in Treatment: 4 Visit Information History Since Last Visit Added or deleted any medications: No Patient Arrived: Ambulatory Any new allergies or adverse reactions: No Arrival Time: 14:27 Had a fall or experienced change in No Accompanied By: self activities of daily living that may affect Transfer Assistance: None risk of falls: Patient Identification Verified: Yes Signs or symptoms of abuse/neglect since last No Secondary Verification Process Yes visito Completed: Hospitalized since last visit: No Patient Has Alerts: Yes Has Dressing in Place as Prescribed: Yes Patient Alerts: DMII Pain Present Now: No Electronic Signature(s) Signed: 06/23/2015 5:29:00 PM By: Elliot Gurney, RN, BSN, Kim RN, BSN Entered By: Elliot Gurney, RN, BSN, Kim on 06/23/2015 14:27:58 [name] ([id number]) -------------------------------------------------------------------------------- Clinic Level of Care Assessment Details Patient Name: [name] Date of Service: 06/23/2015 2:15 PM Medical Record Number: [id number] Patient Account Number: [id number] Date of Birth/Sex: 1945/10/27 (70 y.o. Male) Treating RN: Huel Coventry Primary Care Physician: Bjorn Pippin Other Clinician: Referring Physician: Bjorn Pippin Treating Physician/Extender: Rudene Re in Treatment: 4 Clinic Level of Care Assessment Items TOOL 4 Quantity Score []  - Use when only an EandM is performed on FOLLOW-UP visit 0 ASSESSMENTS - Nursing Assessment / Reassessment []  - Reassessment of Co-morbidities (includes updates in patient  status) 0 X - Reassessment of Adherence to Treatment Plan 1 5 ASSESSMENTS - Wound and Skin Assessment / Reassessment []  - Simple Wound Assessment / Reassessment - one wound 0 []  - Complex Wound Assessment / Reassessment - multiple wounds 0 []  - Dermatologic / Skin Assessment (not related to wound area) 0 ASSESSMENTS - Focused Assessment []  - Circumferential Edema Measurements - multi extremities 0 []  - Nutritional Assessment / Counseling / Intervention 0 []  - Lower Extremity Assessment (monofilament, tuning fork, pulses) 0 []  - Peripheral Arterial Disease Assessment (using hand held doppler) 0 ASSESSMENTS - Ostomy and/or Continence Assessment and Care []  - Incontinence Assessment and Management 0 []  - Ostomy Care Assessment and Management (repouching, etc.) 0 PROCESS - Coordination of Care []  - Simple Patient / Family Education for ongoing care 0 []  - Complex (extensive) Patient / Family Education for ongoing care 0 []  - Staff obtains Chiropractor, Records, Test Results / Process Orders 0 []  - Staff telephones HHA, Nursing Homes / Clarify orders / etc 0 []  - Routine Transfer to another Facility (non-emergent condition) 0 [name], [name] ([id number]) []  - Routine Hospital Admission (non-emergent condition) 0 []  - New Admissions / Manufacturing engineer / Ordering NPWT, Apligraf, etc. 0 []  - Emergency Hospital Admission (emergent condition) 0 X - Simple Discharge Coordination 1 10 []  - Complex (extensive) Discharge Coordination 0 PROCESS - Special Needs []  - Pediatric / Minor Patient Management 0 []  - Isolation Patient Management 0 []  - Hearing / Language / Visual special needs 0 []  - Assessment of Community assistance (transportation, D/C planning, etc.) 0 []  - Additional assistance / Altered mentation 0 []  - Support Surface(s) Assessment (bed, cushion, seat, etc.) 0 INTERVENTIONS - Wound Cleansing / Measurement X - Simple Wound Cleansing - one wound 1 5 []  - Complex Wound Cleansing -  multiple wounds 0 X - Wound Imaging (photographs - any number of wounds) 1 5 []  - Wound Tracing (instead of photographs)  0 X - Simple Wound Measurement - one wound 1 5 []  - Complex Wound Measurement - multiple wounds 0 INTERVENTIONS - Wound Dressings []  - Small Wound Dressing one or multiple wounds 0 []  - Medium Wound Dressing one or multiple wounds 0 []  - Large Wound Dressing one or multiple wounds 0 []  - Application of Medications - topical 0 []  - Application of Medications - injection 0 INTERVENTIONS - Miscellaneous []  - External ear exam 0 [name], [name] ([id number]) []  - Specimen Collection (cultures, biopsies, blood, body fluids, etc.) 0 []  - Specimen(s) / Culture(s) sent or taken to Lab for analysis 0 []  - Patient Transfer (multiple staff / Michiel Sites Lift / Similar devices) 0 []  - Simple Staple / Suture removal (25 or less) 0 []  - Complex Staple / Suture removal (26 or more) 0 []  - Hypo / Hyperglycemic Management (close monitor of Blood Glucose) 0 []  - Ankle / Brachial Index (ABI) - do not check if billed separately 0 X - Vital Signs 1 5 Has the patient been seen at the hospital within the last three years: Yes Total Score: 35 Level Of Care: New/Established - Level 1 Electronic Signature(s) Signed: 06/23/2015 5:29:00 PM By: Elliot Gurney, RN, BSN, Kim RN, BSN Entered By: Elliot Gurney, RN, BSN, Kim on 06/23/2015 14:48:08 [name] ([id number]) -------------------------------------------------------------------------------- Encounter Discharge Information Details Patient Name: [name] Date of Service: 06/23/2015 2:15 PM Medical Record Number: [id number] Patient Account Number: [id number] Date of Birth/Sex: 03-10-1945 (70 y.o. Male) Treating RN: Huel Coventry Primary Care Physician: Bjorn Pippin Other Clinician: Referring Physician: Bjorn Pippin Treating Physician/Extender: Rudene Re in Treatment: 4 Encounter Discharge Information Items Discharge Pain Level: 0 Discharge Condition:  Stable Ambulatory Status: Ambulatory Discharge Destination: Home Transportation: Private Auto Accompanied By: self Schedule Follow-up Appointment: Yes Medication Reconciliation completed and provided to Patient/Care Yes Maryalice Pasley: Provided on Clinical Summary of Care: 06/23/2015 Form Type Recipient Paper Patient RM Electronic Signature(s) Signed: 06/23/2015 5:29:00 PM By: Elliot Gurney, RN, BSN, Kim RN, BSN Previous Signature: 06/23/2015 2:46:48 PM Version By: Gwenlyn Perking Entered By: Elliot Gurney RN, BSN, Kim on 06/23/2015 14:48:48 [name] ([id number]) -------------------------------------------------------------------------------- Lower Extremity Assessment Details Patient Name: [name] Date of Service: 06/23/2015 2:15 PM Medical Record Number: [id number] Patient Account Number: [id number] Date of Birth/Sex: [birth year] (70 y.o. Male) Treating RN: Huel Coventry Primary Care Physician: Bjorn Pippin Other Clinician: Referring Physician: Bjorn Pippin Treating Physician/Extender: Rudene Re in Treatment: 4 Vascular Assessment Pulses: Posterior Tibial Dorsalis Pedis Palpable: [Left:Yes] Extremity colors, hair growth, and conditions: Extremity Color: [Left:Hyperpigmented] Hair Growth on Extremity: [Left:No] Temperature of Extremity: [Left:Warm] Capillary Refill: [Left:< 3 seconds] Toe Nail Assessment Left: Right: Thick: Yes Discolored: Yes Deformed: No Improper Length and Hygiene: No Electronic Signature(s) Signed: 06/23/2015 5:29:00 PM By: Elliot Gurney, RN, BSN, Kim RN, BSN Entered By: Elliot Gurney, RN, BSN, Kim on 06/23/2015 14:32:03 [name] ([id number]) -------------------------------------------------------------------------------- Multi Wound Chart Details Patient Name: [name] Date of Service: 06/23/2015 2:15 PM Medical Record Number: [id number] Patient Account Number: [id number] Date of Birth/Sex: 04-02-1945 (70 y.o. Male) Treating RN: Huel Coventry Primary Care Physician: Bjorn Pippin Other Clinician: Referring Physician: Bjorn Pippin Treating Physician/Extender: Rudene Re in Treatment: 4 Vital Signs Height(in): 71 Pulse(bpm): 56 Weight(lbs): 194 Blood Pressure 132/76 (mmHg): Body Mass Index(BMI): 27 Temperature(F): 98.3 Respiratory Rate 18 (breaths/min): Photos: [N/A:N/A] Wound Location: Right Lower Leg - Medial, N/A N/A Distal Wounding Event: Thermal Burn N/A N/A Primary Etiology: 3rd degree Burn N/A N/A Comorbid History: Hypertension, Type II N/A N/A Diabetes Date Acquired: 05/06/2015 N/A N/A  Weeks of Treatment: 4 N/A N/A Wound Status: Open N/A N/A Measurements L x W x D 0.2x0.1x0.1 N/A N/A (cm) Area (cm) : 0.016 N/A N/A Volume (cm) : 0.002 N/A N/A % Reduction in Area: 99.90% N/A N/A % Reduction in Volume: 99.90% N/A N/A Classification: Full Thickness Without N/A N/A Exposed Support Structures HBO Classification: Grade 1 N/A N/A Exudate Amount: Medium N/A N/A Exudate Type: Serous N/A N/A Exudate Color: amber N/A N/A Wound Margin: Flat and Intact N/A N/A Christopher PorterMOORE, [name] ([id number]) Granulation Amount: Large (67-100%) N/A N/A Granulation Quality: Red, Pink N/A N/A Necrotic Amount: Small (1-33%) N/A N/A Exposed Structures: Fascia: No N/A N/A Fat: No Tendon: No Muscle: No Joint: No Bone: No Limited to Skin Breakdown Epithelialization: Small (1-33%) N/A N/A Periwound Skin Texture: Edema: No N/A N/A Excoriation: No Induration: No Callus: No Crepitus: No Fluctuance: No Friable: No Rash: No Scarring: No Periwound Skin Dry/Scaly: Yes N/A N/A Moisture: Maceration: No Moist: No Periwound Skin Color: Atrophie Blanche: No N/A N/A Cyanosis: No Ecchymosis: No Erythema: No Hemosiderin Staining: No Mottled: No Pallor: No Rubor: No Tenderness on Yes N/A N/A Palpation: Wound Preparation: Ulcer Cleansing: N/A N/A Rinsed/Irrigated with Saline Topical Anesthetic Applied: Other: lidocaine 4% Treatment Notes Electronic  Signature(s) Signed: 06/23/2015 5:29:00 PM By: Elliot GurneyWoody, RN, BSN, Kim RN, BSN Entered By: Elliot GurneyWoody, RN, BSN, Kim on 06/23/2015 14:47:45 Christopher PorterMOORE, [name] ([id number]) -------------------------------------------------------------------------------- Multi-Disciplinary Care Plan Details Patient Name: Christopher PorterMOORE, [name] Date of Service: 06/23/2015 2:15 PM Medical Record Number: [id number] Patient Account Number: [id number] Date of Birth/Sex: [birth year](70 y.o. Male) Treating RN: Huel CoventryWoody, Kim Primary Care Physician: Bjorn PippinKrebs, Amy Other Clinician: Referring Physician: Bjorn PippinKrebs, Amy Treating Physician/Extender: Rudene ReBritto, Errol Weeks in Treatment: 4 Active Inactive Electronic Signature(s) Signed: 06/23/2015 5:29:00 PM By: Elliot GurneyWoody, RN, BSN, Kim RN, BSN Entered By: Elliot GurneyWoody, RN, BSN, Kim on 06/23/2015 14:46:51 Christopher PorterMOORE, [name] ([id number]) -------------------------------------------------------------------------------- Pain Assessment Details Patient Name: Christopher PorterMOORE, [name] Date of Service: 06/23/2015 2:15 PM Medical Record Number: [id number] Patient Account Number: [id number] Date of Birth/Sex: [birth year](70 y.o. Male) Treating RN: Huel CoventryWoody, Kim Primary Care Physician: Bjorn PippinKrebs, Amy Other Clinician: Referring Physician: Bjorn PippinKrebs, Amy Treating Physician/Extender: Rudene ReBritto, Errol Weeks in Treatment: 4 Active Problems Location of Pain Severity and Description of Pain Patient Has Paino No Site Locations With Dressing Change: No Pain Management and Medication Current Pain Management: Electronic Signature(s) Signed: 06/23/2015 5:29:00 PM By: Elliot GurneyWoody, RN, BSN, Kim RN, BSN Entered By: Elliot GurneyWoody, RN, BSN, Kim on 06/23/2015 14:28:42 Christopher PorterMOORE, [name] ([id number]) -------------------------------------------------------------------------------- Patient/Caregiver Education Details Patient Name: Christopher PorterMOORE, [name] Date of Service: 06/23/2015 2:15 PM Medical Record Number: [id number] Patient Account Number: [id number] Date of Birth/Gender: [birth year](70 y.o.  Male) Treating RN: Huel CoventryWoody, Kim Primary Care Physician: Bjorn PippinKrebs, Amy Other Clinician: Referring Physician: Bjorn PippinKrebs, Amy Treating Physician/Extender: Rudene ReBritto, Errol Weeks in Treatment: 4 Education Assessment Education Provided To: Patient Education Topics Provided Wound/Skin Impairment: Handouts: Caring for Your Ulcer Methods: Demonstration Responses: State content correctly Electronic Signature(s) Signed: 06/23/2015 5:29:00 PM By: Elliot GurneyWoody, RN, BSN, Kim RN, BSN Entered By: Elliot GurneyWoody, RN, BSN, Kim on 06/23/2015 14:48:59 Christopher PorterMOORE, [name] ([id number]) -------------------------------------------------------------------------------- Wound Assessment Details Patient Name: Christopher PorterMOORE, [name] Date of Service: 06/23/2015 2:15 PM Medical Record Number: [id number] Patient Account Number: [id number] Date of Birth/Sex: [birth year](70 y.o. Male) Treating RN: Huel CoventryWoody, Kim Primary Care Physician: Bjorn PippinKrebs, Amy Other Clinician: Referring Physician: Bjorn PippinKrebs, Amy Treating Physician/Extender: Rudene ReBritto, Errol Weeks in Treatment: 4 Wound Status Wound Number: 2 Primary Etiology: 3rd degree Burn Wound Location: Right Lower Leg - Medial, Wound Status: Healed - Epithelialized Distal Comorbid History: Hypertension, Type II  Diabetes Wounding Event: Thermal Burn Date Acquired: 05/06/2015 Weeks Of Treatment: 4 Clustered Wound: No Photos Wound Measurements Length: (cm) 0 % Reduction Width: (cm) 0 % Reduction Depth: (cm) 0 Epithelializ Area: (cm) 0 Tunneling: Volume: (cm) 0 Undermining in Area: 100% in Volume: 100% ation: Small (1-33%) No : No Wound Description Full Thickness Without Classification: Exposed Support Structures Diabetic Severity Grade 1 (Wagner): Wound Margin: Flat and Intact Exudate Amount: Medium Exudate Type: Serous Exudate Color: amber Foul Odor After Cleansing: No Wound Bed Granulation Amount: Large (67-100%) Exposed Structure Granulation Quality: Red, Pink Fascia Exposed: No [name], [name]  ([id number]) Necrotic Amount: Small (1-33%) Fat Layer Exposed: No Tendon Exposed: No Muscle Exposed: No Joint Exposed: No Bone Exposed: No Limited to Skin Breakdown Periwound Skin Texture Texture Color No Abnormalities Noted: No No Abnormalities Noted: No Callus: No Atrophie Blanche: No Crepitus: No Cyanosis: No Excoriation: No Ecchymosis: No Fluctuance: No Erythema: No Friable: No Hemosiderin Staining: No Induration: No Mottled: No Localized Edema: No Pallor: No Rash: No Rubor: No Scarring: No Temperature / Pain Moisture Tenderness on Palpation: Yes No Abnormalities Noted: No Dry / Scaly: Yes Maceration: No Moist: No Wound Preparation Ulcer Cleansing: Rinsed/Irrigated with Saline Topical Anesthetic Applied: Other: lidocaine 4%, Treatment Notes Wound #2 (Right, Distal, Medial Lower Leg) 7. Secured with Self adhesive bandage Electronic Signature(s) Signed: 09/19/2015 1:42:50 PM By: Elliot Gurney, RN, BSN, Kim RN, BSN Previous Signature: 06/23/2015 5:29:00 PM Version By: Elliot Gurney RN, BSN, Kim RN, BSN Entered By: Elliot Gurney, RN, BSN, Kim on 06/30/2015 16:25:19 [name], [name] ([id number]) -------------------------------------------------------------------------------- Vitals Details Patient Name: [name] Date of Service: 06/23/2015 2:15 PM Medical Record Number: [id number] Patient Account Number: [id number] Date of Birth/Sex: [birth year] (70 y.o. Male) Treating RN: Huel Coventry Primary Care Physician: Bjorn Pippin Other Clinician: Referring Physician: Bjorn Pippin Treating Physician/Extender: Rudene Re in Treatment: 4 Vital Signs Time Taken: 14:28 Temperature (F): 98.3 Height (in): 71 Pulse (bpm): 56 Weight (lbs): 194 Respiratory Rate (breaths/min): 18 Body Mass Index (BMI): 27.1 Blood Pressure (mmHg): 132/76 Reference Range: 80 - 120 mg / dl Electronic Signature(s) Signed: 06/23/2015 5:29:00 PM By: Elliot Gurney, RN, BSN, Kim RN, BSN Entered By: Elliot Gurney, RN, BSN, Kim on  06/23/2015 14:29:01

## 2015-10-26 ENCOUNTER — Telehealth: Payer: Self-pay

## 2015-10-26 DIAGNOSIS — N529 Male erectile dysfunction, unspecified: Secondary | ICD-10-CM

## 2015-10-26 MED ORDER — SILDENAFIL CITRATE 100 MG PO TABS: 100.0000 mg | ORAL_TABLET | Freq: Every day | ORAL | 11 refills | Status: DC | PRN

## 2015-10-26 NOTE — Telephone Encounter (Signed)
Patient called and is requesting Viagra called into [facility] [place].

## 2015-10-26 NOTE — Telephone Encounter (Signed)
Sent!

## 2015-10-31 ENCOUNTER — Ambulatory Visit (INDEPENDENT_AMBULATORY_CARE_PROVIDER_SITE_OTHER): Payer: Commercial Managed Care - HMO | Admitting: Family Medicine

## 2015-10-31 ENCOUNTER — Encounter: Payer: Self-pay | Admitting: Family Medicine

## 2015-10-31 ENCOUNTER — Other Ambulatory Visit: Payer: Self-pay | Admitting: Family Medicine

## 2015-10-31 VITALS — BP 139/86 | HR 57 | Temp 98.1°F | Resp 16 | Ht 71.0 in | Wt 200.0 lb

## 2015-10-31 DIAGNOSIS — E1165 Type 2 diabetes mellitus with hyperglycemia: Secondary | ICD-10-CM

## 2015-10-31 DIAGNOSIS — N529 Male erectile dysfunction, unspecified: Secondary | ICD-10-CM | POA: Diagnosis not present

## 2015-10-31 DIAGNOSIS — I10 Essential (primary) hypertension: Secondary | ICD-10-CM

## 2015-10-31 DIAGNOSIS — E785 Hyperlipidemia, unspecified: Secondary | ICD-10-CM

## 2015-10-31 DIAGNOSIS — Z125 Encounter for screening for malignant neoplasm of prostate: Secondary | ICD-10-CM | POA: Diagnosis not present

## 2015-10-31 DIAGNOSIS — E1169 Type 2 diabetes mellitus with other specified complication: Secondary | ICD-10-CM

## 2015-10-31 DIAGNOSIS — Z9189 Other specified personal risk factors, not elsewhere classified: Secondary | ICD-10-CM

## 2015-10-31 DIAGNOSIS — Z1211 Encounter for screening for malignant neoplasm of colon: Secondary | ICD-10-CM | POA: Diagnosis not present

## 2015-10-31 DIAGNOSIS — E1142 Type 2 diabetes mellitus with diabetic polyneuropathy: Secondary | ICD-10-CM

## 2015-10-31 DIAGNOSIS — IMO0002 Reserved for concepts with insufficient information to code with codable children: Secondary | ICD-10-CM

## 2015-10-31 LAB — BASIC METABOLIC PANEL WITH GFR
BUN: 10 mg/dL (ref 7–25)
CALCIUM: 9.4 mg/dL (ref 8.6–10.3)
CO2: 24 mmol/L (ref 20–31)
CREATININE: 0.97 mg/dL (ref 0.70–1.25)
Chloride: 103 mmol/L (ref 98–110)
GFR, EST NON AFRICAN AMERICAN: 79 mL/min (ref >=60)
Glucose, Bld: 141 mg/dL — ABNORMAL HIGH (ref 65–99)
Potassium: 4.2 mmol/L (ref 3.5–5.3)
SODIUM: 138 mmol/L (ref 135–146)

## 2015-10-31 LAB — POCT GLYCOSYLATED HEMOGLOBIN (HGB A1C): HEMOGLOBIN A1C: 8.3

## 2015-10-31 MED ORDER — SILDENAFIL CITRATE 20 MG PO TABS: 20.0000 mg | ORAL_TABLET | Freq: Three times a day (TID) | ORAL | 11 refills | Status: DC

## 2015-10-31 MED ORDER — CANAGLIFLOZIN 100 MG PO TABS: 100.0000 mg | ORAL_TABLET | Freq: Every day | ORAL | 2 refills | Status: DC

## 2015-10-31 NOTE — Assessment & Plan Note (Signed)
Continue statin. LDL at 88. Pt is unwilling to increase strength of statin at this time due to cost concerns- however consider change to moderate intensity for LDL goal <70 in the future.

## 2015-10-31 NOTE — Assessment & Plan Note (Signed)
Pt is concerned about ED. Concerned his diabetes medications could be causing symptoms- reviewed that cause is likely multi-factorial. Will check PSA today. Renewed generic sildenafil. Reviewed diet and lifestyle modifications to help with ED. Consider urology if persistent.

## 2015-10-31 NOTE — Assessment & Plan Note (Signed)
A1c increasing despite medication changes. 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 At this time we will try an SGLT2 as the patient prefers oral medications. Start 100mg  invokana once daily- recheck kidney function in 1 mos and increase to 300mg  dosing. Reviewed risks vs. Benefits.  Stressed the importance of diet and lifestyle changes- encouraged avoidance of high carb foods- specifically fries- which seem a staple in the diet. Offered to refer to Diabetes education- pt declined. Reviewed dietary recommendations. Encouraged a formal exercise program. UA microalbmin is UTD. Foot exam done today.  Eye exam due Feb 2018.  Recheck 1 mos.

## 2015-10-31 NOTE — Progress Notes (Signed)
Subjective:    Patient ID: [name], male    DOB: [birth year], 70 y.o.   MRN: [id number]  HPI: [name] is a 71 y.o. male presenting on 10/31/2015 for Diabetes (highest 170 and lowest 70)   HPI  Pt presents for diabetes follow-up. Last A1c was 7.8%. Currently taking Januvia, Metformin, and Amaryl. Is not checking his sugars at home. Has had a 6lb weight gain since his last visit. 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 Drinks diet soda. Eats mainly fish mainly with hush puppies and slaw on the side. Not eating regular meals. Does not have a regular exercise program. No numbness or tingling in his feet. Checks them regularly. Eye exam Feb 2017. Urine microablumin was March of 2017.  Cologuard: Has never screened for colon cancer.  PSA: Needs today.  Would like to screen for hepatitis C and HIV.  Has had >2 sexual partners in the past year. Would like screening for STI's today. Recent partner reported vaginal discharge after sex but unclear if she was tested for any STIs. Offered GC chlamydia screening but patient was concerned about cost today.  Flu done at the drug store.    Past Medical History:  Diagnosis Date  . Diabetes mellitus without complication (HCC)   . Erectile dysfunction   . Hyperlipidemia   . Hypertension   . Right-sided chest pain     Current Outpatient Prescriptions on File Prior to Visit  Medication Sig  . glucose blood test strip Please check blood glucose twice daily  . ibuprofen (ADVIL,MOTRIN) 600 MG tablet Take 1 tablet (600 mg total) by mouth every 8 (eight) hours as needed.  [name] lisinopril-hydrochlorothiazide (PRINZIDE,ZESTORETIC) 20-12.5 MG per tablet Take 1 tablet by mouth daily.  . metFORMIN (GLUCOPHAGE) 1000 MG tablet Take 1 tablet (1,000 mg total) by mouth 2 (two) times daily with a meal.  . ONETOUCH DELICA LANCETS 33G MISC 1 each by Does not apply route 3 (three) times daily.  . pravastatin (PRAVACHOL) 10 MG tablet Take 1 tablet (10  mg total) by mouth daily.  [name] selenium sulfide (SELSUN) 2.5 % shampoo Apply topically daily as needed for irritation.  . sildenafil (VIAGRA) 100 MG tablet Take 1 tablet (100 mg total) by mouth daily as needed for erectile dysfunction.   No current facility-administered medications on file prior to visit.     Review of Systems  Constitutional: Negative for chills and fever.  HENT: Negative.   Respiratory: Negative for chest tightness, shortness of breath and wheezing.   Cardiovascular: Negative for chest pain, palpitations and leg swelling.  Gastrointestinal: Negative for abdominal pain, nausea and vomiting.  Endocrine: Negative.  Negative for polydipsia, polyphagia and polyuria.  Genitourinary: Negative for discharge, dysuria, penile pain, testicular pain and urgency.       Difficulty getting and maintaining erections.   Musculoskeletal: Negative for arthralgias, back pain and joint swelling.  Skin: Negative.   Neurological: Negative for dizziness, weakness, numbness and headaches.  Psychiatric/Behavioral: Negative for dysphoric mood and sleep disturbance.   Per HPI unless specifically indicated above     Objective:    BP 139/86   Pulse (!) 57   Temp 98.1 F (36.7 C) (Oral)   Resp 16   Ht 5\' 11"  (1.803 m)   Wt 200 lb (90.7 kg)   BMI 27.89 kg/m   Wt Readings from Last 3 Encounters:  10/31/15 200 lb (90.7 kg)  05/15/15 194 lb (88 kg)  05/11/15 194  lb 9.6 oz (88.3 kg)    Physical Exam  Constitutional: He is oriented to person, place, and time. He appears well-developed and well-nourished. No distress.  HENT:  Head: Normocephalic and atraumatic.  Neck: Neck supple. No thyromegaly present.  Cardiovascular: Normal rate, regular rhythm and normal heart sounds.  Exam reveals no gallop and no friction rub.   No murmur heard. Pulmonary/Chest: Effort normal and breath sounds normal. He has no wheezes.  Abdominal: Soft. Bowel sounds are normal. He exhibits no distension. There is  no tenderness. There is no rebound.  Musculoskeletal: Normal range of motion. He exhibits no edema or tenderness.  Neurological: He is alert and oriented to person, place, and time. He has normal reflexes.  Skin: Skin is warm and dry. No rash noted. No erythema.  Psychiatric: He has a normal mood and affect. His behavior is normal. Thought content normal.     Diabetic Foot Exam - Simple   Simple Foot Form Diabetic Foot exam was performed with the following findings:  Yes 10/31/2015  1:31 PM  Visual Inspection No deformities, no ulcerations, no other skin breakdown bilaterally:  Yes Sensation Testing Intact to touch and monofilament testing bilaterally:  Yes Pulse Check Posterior Tibialis and Dorsalis pulse intact bilaterally:  Yes Comments     Results for orders placed or performed in visit on 10/31/15  POCT HgB A1C  Result Value Ref Range   Hemoglobin A1C 8.3       Assessment & Plan:   Problem List Items Addressed This Visit      Cardiovascular and Mediastinum   BP (high blood pressure)    Controlled. Continue current regimen. Will check BMP today.       Relevant Medications   sildenafil (REVATIO) 20 MG tablet     Endocrine   Hyperlipidemia associated with type 2 diabetes mellitus (HCC)    Continue statin. LDL at 88. Pt is unwilling to increase strength of statin at this time due to cost concerns- however consider change to moderate intensity for LDL goal <70 in the future.       Relevant Medications   sildenafil (REVATIO) 20 MG tablet   canagliflozin (INVOKANA) 100 MG TABS tablet   DM (diabetes mellitus), type 2, uncontrolled (HCC) - Primary    A1c increasing despite medication changes. 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 At this time we will try an SGLT2 as the patient prefers oral medications. Start 100mg  invokana once daily- recheck kidney function in 1 mos and increase to 300mg   dosing. Reviewed risks vs. Benefits.  Stressed the importance of diet and lifestyle changes- encouraged avoidance of high carb foods- specifically fries- which seem a staple in the diet. Offered to refer to Diabetes education- pt declined. Reviewed dietary recommendations. Encouraged a formal exercise program. UA microalbmin is UTD. Foot exam done today.  Eye exam due Feb 2018.  Recheck 1 mos.       Relevant Medications   canagliflozin (INVOKANA) 100 MG TABS tablet   Other Relevant Orders   POCT HgB A1C (Completed)   BASIC METABOLIC PANEL WITH GFR     Genitourinary   ED (erectile dysfunction) of organic origin    Pt is concerned about ED. Concerned his diabetes medications could be causing symptoms- reviewed that cause is likely multi-factorial. Will check PSA today. Renewed generic sildenafil. Reviewed diet and lifestyle modifications to help with ED. Consider urology  if persistent.       Relevant Medications   sildenafil (REVATIO) 20 MG tablet    Other Visit Diagnoses    At risk for sexually transmitted disease due to partner with multiple partners       Pt concerned about possible STI. Check Hep C and HIV today. Pt wanted to ensure his insurance would cover GC/chlamydia prior to initiating testing.    Relevant Orders   Hepatitis C Ab Reflex HCV RNA, QUANT   HIV antibody   Screening for prostate cancer       Relevant Orders   PSA   Screening for colon cancer       Relevant Orders   Cologuard      Meds ordered this encounter  Medications  . sildenafil (REVATIO) 20 MG tablet    Sig: Take 1 tablet (20 mg total) by mouth 3 (three) times daily. Take 1-5 tablets as needed 30 minutes prior to sex.    Dispense:  30 tablet    Refill:  11    Order Specific Question:   Supervising Provider    Answer:   Janeann ForehandHAWKINS JR, JAMES H [phone][[id number]]  . canagliflozin (INVOKANA) 100 MG TABS tablet    Sig: Take 1 tablet (100 mg total) by mouth daily before breakfast.    Dispense:  30 tablet     Refill:  2    Order Specific Question:   Supervising Provider    Answer:   Janeann ForehandHAWKINS JR, JAMES H [[id number]]      Follow up plan: Return in about 4 weeks (around 11/28/2015), or if symptoms worsen or fail to improve.

## 2015-10-31 NOTE — Patient Instructions (Signed)
Your A1c was increased. We will plan to change your medications today. Continue to take metformin twice daily. Add invokana 100mg  once daily to your regimen. Please stop the Amaryl (glimepiride) and Januvia.   Really work on reducing the carbs in your diet. Drink only water.  Exercise 10 minutes after each meal.   Please try to meet the goal of 150 minutes of exercise per week.  This is generally 30-40 minutes of moderate activity 3-4 times per week. Consider a calorie goal of 1800 per day. Eat mainly fruits, vegetables, and lean proteins (chicken or fish).  A great resource for healthy living and weight loss is https://hernandez-anderson.info/Choosemyplate.gov   We will check your labwork in 1 mos.

## 2015-10-31 NOTE — Assessment & Plan Note (Signed)
Controlled. Continue current regimen. Will check BMP today.

## 2015-11-01 LAB — HEPATITIS C ANTIBODY: HCV Ab: NEGATIVE

## 2015-11-01 LAB — HIV ANTIBODY (ROUTINE TESTING W REFLEX): HIV 1&2 Ab, 4th Generation: NONREACTIVE

## 2015-11-01 LAB — PSA: PSA: 0.3 ng/mL (ref <=4.0)

## 2015-12-01 ENCOUNTER — Ambulatory Visit: Payer: Commercial Managed Care - HMO | Admitting: Family Medicine

## 2015-12-05 ENCOUNTER — Ambulatory Visit: Payer: Commercial Managed Care - HMO | Admitting: Family Medicine

## 2015-12-06 ENCOUNTER — Ambulatory Visit (INDEPENDENT_AMBULATORY_CARE_PROVIDER_SITE_OTHER): Payer: Commercial Managed Care - HMO | Admitting: Family Medicine

## 2015-12-06 ENCOUNTER — Encounter: Payer: Self-pay | Admitting: Family Medicine

## 2015-12-06 VITALS — BP 136/88 | HR 70 | Temp 98.3°F | Resp 16 | Ht 71.0 in | Wt 194.0 lb

## 2015-12-06 DIAGNOSIS — E1165 Type 2 diabetes mellitus with hyperglycemia: Secondary | ICD-10-CM

## 2015-12-06 DIAGNOSIS — E1142 Type 2 diabetes mellitus with diabetic polyneuropathy: Secondary | ICD-10-CM

## 2015-12-06 DIAGNOSIS — IMO0002 Reserved for concepts with insufficient information to code with codable children: Secondary | ICD-10-CM

## 2015-12-06 DIAGNOSIS — L700 Acne vulgaris: Secondary | ICD-10-CM | POA: Diagnosis not present

## 2015-12-06 NOTE — Assessment & Plan Note (Signed)
Gradual increased A1c over past 1 yr, from 7.8 to 8.3, recent med change to Metformin and added Invokana, now off Januvia and sulfonylurea - Complication with DM neuropathy - Seems resistant to some lifestyle modifications, non-adherent  Plan: 1. No CBG readings today, and no additional feedback, will continue current dose and regimen of Invokana 100mg  daily and Metformin max dose 2. Check BMET to monitor Cr on SGLT2 3. Reviewed lifestyle modification recommendations 4. Continue ACEi, statin 5. Follow-up 2 months for A1c, DM follow-up, if needed will titrate up Invokana at that time

## 2015-12-06 NOTE — Assessment & Plan Note (Signed)
Mild facial nodular acne, seems worse in areas of shaving - Recommendations for OTC trial benzoyl peroxide containing medicine, follow-up as needed, handout on routine acne skin care

## 2015-12-06 NOTE — Patient Instructions (Addendum)
Thank you for coming in to clinic today.  1. Keep taking Invokana, same as you were. No medication changes today. - They had asked you to come back to check chemistry blood work for kidney function on new medication, not the A1c - Please schedule non fasting Lab Only visit anytime in next 1-2 weeks to get blood drawn in morning, we will notify you with results  Try to work on improving diet and lifestyle with regular exercise to control blood sugar  You have refills to last 3 months on new medication  For Acne:  General Recommendations: - While using topical medication, it may take 4 - 6 weeks before you see improvement. - Not every medicine works the same for everyone (different skin types and different acne). If this does not work, at future follow-up apt will discuss alternative options. - Use a mild soap on your face, such as Dove or [place]vory (goal to remove oils and topical medicines, but avoid harsh cleansers that irritate skin) - Recommend to reduce sugar in your diet (less chocolate, candies, sodas) will help clear your face more quickly and keep it clear.  Benzoyl Peroxide (topical): Use once daily, after you have washed your face with soap and water. - It may cause some minor skin irritation initially.    Please schedule a follow-up appointment with Dr. Althea CharonKaramalegos in 2 months after 01/31/16 for Diabetes, A1c  If you have any other questions or concerns, please feel free to call the clinic or send a message through MyChart. You may also schedule an earlier appointment if necessary.  Saralyn PilarAlexander Yasmen Cortner, DO [facility] [place] [facility]

## 2015-12-06 NOTE — Progress Notes (Signed)
Subjective:    Patient ID: [name], male    DOB: [birth year], 70 y.o.   MRN: [id number]  [name] is a 70 y.o. male presenting on 12/06/2015 for Diabetes   HPI   CHRONIC DM, Type 2: Reports last visit 1 month ago for physical and DM, had increased A1c from 7.8 up to 8.3, his medications were changed from Januvia and Amaryl to Invokana, asked to return in 1 month for labs - He has no additional concerns, thinks he is doing well on new med, but does not check CBG regularly and has no additional information, he was expecting A1c but not due since only 1 month later Meds: Metformin 1000mg  BID, Invokana 100mg  daily Reports good compliance. 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  ACNE - Reports concern with recent bumps on face, worse after shaving. He describes occasional pimple with some drainage of pus and resolution. Not using any topical treatments. Never followed by dermatology. - No facial soap cleansing regimen  Social History  Substance Use Topics  . Smoking status: Former Games developermoker  . Smokeless tobacco: Not on file  . Alcohol use Yes    Review of Systems Per HPI unless specifically indicated above     Objective:    BP 136/88   Pulse 70   Temp 98.3 F (36.8 C) (Oral)   Resp 16   Ht 5\' 11"  (1.803 m)   Wt 194 lb (88 kg)   BMI 27.06 kg/m   Wt Readings from Last 3 Encounters:  12/06/15 194 lb (88 kg)  10/31/15 200 lb (90.7 kg)  05/15/15 194 lb (88 kg)    Physical Exam  Constitutional: He appears well-developed and well-nourished. No distress.  Well-appearing, comfortable, cooperative  HENT:  Head: Normocephalic and atraumatic.  Mouth/Throat: Oropharynx is clear and moist.  Cardiovascular: Normal rate, regular rhythm,  normal heart sounds and intact distal pulses.   No murmur heard. Pulmonary/Chest: Effort normal and breath sounds normal. No respiratory distress. He has no wheezes. He has no rales.  Neurological: He is alert.  Skin: Skin is warm and dry. He is not diaphoretic.  Very few small nodular acne bumps on face, mostly on cheeks, one active with minor erythema, no pustules or significant scarring.  Nursing note and vitals reviewed.      Assessment & Plan:   Problem List Items Addressed This Visit    DM (diabetes mellitus), type 2, uncontrolled (HCC) - Primary    Gradual increased A1c over past 1 yr, from 7.8 to 8.3, recent med change to Metformin and added Invokana, now off Januvia and sulfonylurea - Complication with DM neuropathy - Seems resistant to some lifestyle modifications, non-adherent  Plan: 1. No CBG readings today, and no additional feedback, will continue current dose and regimen of Invokana 100mg  daily and Metformin max dose 2. Check BMET to monitor Cr on SGLT2 3. Reviewed lifestyle modification recommendations 4. Continue ACEi, statin 5. Follow-up 2 months for A1c, DM follow-up, if needed will titrate up Invokana at that time      Relevant Orders   BASIC METABOLIC PANEL WITH GFR   Acne vulgaris    Mild facial nodular acne, seems worse in areas of shaving - Recommendations for OTC trial benzoyl peroxide containing medicine, follow-up as needed, handout on routine  acne skin care         No orders of the defined types were placed in this encounter.     Follow up plan: Return in about 2 months (around 02/05/2016) for diabetes.  Saralyn PilarAlexander [name], DO [facility] 12/06/2015, 11:30 PM

## 2016-01-08 ENCOUNTER — Other Ambulatory Visit: Payer: Self-pay | Admitting: Family Medicine

## 2016-01-08 DIAGNOSIS — E785 Hyperlipidemia, unspecified: Secondary | ICD-10-CM

## 2016-01-08 MED ORDER — PRAVASTATIN SODIUM 10 MG PO TABS: 10.0000 mg | ORAL_TABLET | Freq: Every day | ORAL | 3 refills | Status: DC

## 2016-01-09 ENCOUNTER — Other Ambulatory Visit: Payer: Self-pay | Admitting: Family Medicine

## 2016-01-09 DIAGNOSIS — I10 Essential (primary) hypertension: Secondary | ICD-10-CM

## 2016-01-18 ENCOUNTER — Other Ambulatory Visit: Payer: Self-pay | Admitting: Family Medicine

## 2016-01-18 DIAGNOSIS — IMO0001 Reserved for inherently not codable concepts without codable children: Secondary | ICD-10-CM

## 2016-01-18 DIAGNOSIS — E1165 Type 2 diabetes mellitus with hyperglycemia: Principal | ICD-10-CM

## 2016-01-18 MED ORDER — METFORMIN HCL 1000 MG PO TABS: 1000.0000 mg | ORAL_TABLET | Freq: Two times a day (BID) | ORAL | 3 refills | Status: DC

## 2016-02-07 ENCOUNTER — Ambulatory Visit: Payer: Commercial Managed Care - HMO | Admitting: Family Medicine

## 2016-02-09 ENCOUNTER — Encounter: Payer: Self-pay | Admitting: *Deleted

## 2016-02-09 ENCOUNTER — Ambulatory Visit (INDEPENDENT_AMBULATORY_CARE_PROVIDER_SITE_OTHER): Payer: Commercial Managed Care - HMO | Admitting: Family Medicine

## 2016-02-09 VITALS — BP 129/70 | HR 66 | Temp 98.3°F | Resp 16 | Ht 71.0 in | Wt 197.6 lb

## 2016-02-09 DIAGNOSIS — N529 Male erectile dysfunction, unspecified: Secondary | ICD-10-CM | POA: Diagnosis not present

## 2016-02-09 DIAGNOSIS — E785 Hyperlipidemia, unspecified: Secondary | ICD-10-CM | POA: Diagnosis not present

## 2016-02-09 DIAGNOSIS — E1165 Type 2 diabetes mellitus with hyperglycemia: Secondary | ICD-10-CM

## 2016-02-09 DIAGNOSIS — L700 Acne vulgaris: Secondary | ICD-10-CM

## 2016-02-09 DIAGNOSIS — IMO0002 Reserved for concepts with insufficient information to code with codable children: Secondary | ICD-10-CM

## 2016-02-09 DIAGNOSIS — E1169 Type 2 diabetes mellitus with other specified complication: Secondary | ICD-10-CM

## 2016-02-09 DIAGNOSIS — E1142 Type 2 diabetes mellitus with diabetic polyneuropathy: Secondary | ICD-10-CM | POA: Diagnosis not present

## 2016-02-09 LAB — POCT GLYCOSYLATED HEMOGLOBIN (HGB A1C): Hemoglobin A1C: 8.1

## 2016-02-09 MED ORDER — DULAGLUTIDE 0.75 MG/0.5ML ~~LOC~~ SOAJ: 0.7500 mg | SUBCUTANEOUS | 2 refills | Status: DC

## 2016-02-09 NOTE — Patient Instructions (Addendum)
Thank you for coming in to clinic today.  1.  Diabetes, A1c 8.1, last time 8.3, slightly better - Start checking blood sugar more regularly, at least once a day fasting before breakfast, write down numbers, also several times a week, not everyday, check 2 hours after LUNCH OR DINNER, to get more numbers - Bring this sheet to next visit  Medication options: - Farxiga (generic name Dapagliflozin) - pill form same class as Invokana   INJECTABLE once weekly - Trulicity (generic name Dulaglutide) - Bydureon (generic name Exentide)  INJECTABLE once DAILY - same class of medication - Victoza (generic name Liraglutide)  With all the injectables you can get nausea and rarely vomiting early in course, usually first few weeks, if unable to tolerate it then we can try to switch.  ASK PHARMACIST WHICH OF THESE OPTIONS IS MOST COVERED BY INS AND WHAT COST IS, OR YOU CAN CALL [employer] TO CHECK WITH THEM TO SEE COVERAGE   Try to work on improving diet and lifestyle with regular exercise to control blood sugar  If acne is not improved, let me know and we can send in Differin gel topical, anti acne medication. There are not too many other options, and these bumps can be hard to heal. Next step we could try topical antibiotic ointment, and then lastly would be Dermatologist.  You will be due for FASTING BLOOD WORK (no food or drink after midnight before, only water or coffee without cream/sugar on the morning of)  - Please go ahead and schedule a "Lab Only" visit in the morning at the clinic for lab draw in 3 months (after 05/09/16) - Make sure Lab Only appointment is at least 1-2 weeks before your next appointment, so that results will be available  For Lab Results, once available within 2-3 days of blood draw, you can can log in to MyChart online to view your results and a brief explanation. Also, we can discuss results at next follow-up visit.  Please schedule a follow-up appointment with Dr.  Althea CharonKaramalegos in 3 months for Annual Physical, lab results  If you have any other questions or concerns, please feel free to call the clinic or send a message through MyChart. You may also schedule an earlier appointment if necessary.  Saralyn PilarAlexander [name], DO [facility], [place]

## 2016-02-09 NOTE — Progress Notes (Signed)
Subjective:    Patient ID: [name], male    DOB: [birth year], 71 y.o.   MRN: [id number]  [name] is a 71 y.o. male presenting on 02/09/2016 for Diabetes (highest BS 180 and lowest 131)   HPI   CHRONIC DM, Type 2: Last visit with me 3 months ago 11/2015, had increased A1c, he was started on Invokana by prior PCP in 10/2015, and taken off Januvia and sulfonylurea. Last visit he did not have CBG logs, had limited input on his blood sugar readings. 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 - Today reports no significant changes since last visit. He never arrived to get lab only blood draw to check chemistry for Cr while on Invokana prior to potentially increasing dose today. - He states no longer able to afford Invokana now in 2018. First rx would cost >$240, and then it would be back to $47 monthly, he has stopped this med 1 week ago and resume glipzide, he is unsure dose. CBGs: Checks CBG 2-3x weekly, ranging 130-180, variable results Meds: Metformin 1000mg  BID, Glipizide 10mg  Reports good compliance. 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  ACNE, Facial - Last visit with concern of facial acne with bumps along beard line, jaw and chin, has occasional pustule, he was not using any treatment previous. Recommended OTC Benzoyl Peroxide, he tried this without significant improvement.  HYPERLIPIDEMIA: - Reports no concerns. Last lipid panel 04/2015, controlled on pravastatin, tolerating well without myalgias  Erectile Dysfunction: - Chronic problem, worse more recently on multiple  medications for DM / BP over past few years, describes difficulty obtaining / maintaining erection. He is currently taking generic Sildenafil 100mg  tabs, without great improvement. Requesting branded Viagra. Has not seen Urologist. - Admits good sexual desire   Social History  Substance Use Topics  . Smoking status: Former Games developermoker  . Smokeless tobacco: Not on file  . Alcohol use Yes    Review of Systems Per HPI unless specifically indicated above     Objective:    BP 129/70   Pulse 66   Temp 98.3 F (36.8 C) (Oral)   Resp 16   Ht 5\' 11"  (1.803 m)   Wt 197 lb 9.6 oz (89.6 kg)   BMI 27.56 kg/m   Wt Readings from Last 3 Encounters:  02/09/16 197 lb 9.6 oz (89.6 kg)  12/06/15 194 lb (88 kg)  10/31/15 200 lb (90.7 kg)    Physical Exam  Constitutional: He is oriented to person, place, and time. He appears well-developed and well-nourished. No distress.  Well-appearing, comfortable, cooperative  HENT:  Head: Normocephalic and atraumatic.  Mouth/Throat: Oropharynx is clear and moist.  Cardiovascular: Normal rate and intact distal pulses.   Pulmonary/Chest: Effort normal.  Musculoskeletal: He exhibits no edema.  Neurological: He is alert and oriented to person, place, and time.  Skin: Skin is warm and dry. No rash noted. He is not diaphoretic. No erythema.  Stable unchanged, very few small nodular acne bumps on face, mostly on jawline cheeks, R>L without active pustular lesions or erythema, some nodular scarring present  Psychiatric: His behavior is normal.  Nursing  note and vitals reviewed.      Assessment & Plan:   Problem List Items Addressed This Visit    Hyperlipidemia associated with type 2 diabetes mellitus (HCC)    Stable lipids last checked 04/2015 on pravastatin - Re-check future lipids at annual physical 3 mo      Relevant Medications   Dulaglutide (TRULICITY) 0.75 MG/0.5ML SOPN   Other Relevant Orders   Lipid panel   ED (erectile dysfunction) of organic  origin    Stable ED without significant improvement on generic Sildenafil 100mg  PRN. Likely secondary to organic etiology with DM, HTN and medications also likely age related. - Expressing interest in branded Viagra that may work better, advised him today that this is unlikely if not responding to Sildenafil 100mg , and explained it would be very costly, given he has discontinued DM medication recently due to cost, this option is not a good idea - Continue generic PRN, focus on treating DM and if not improved in future, consider Urology referral      DM (diabetes mellitus), type 2, uncontrolled (HCC) - Primary    Stable to slightly improved A1c from 8.3 to 8.1, however multiple med changes has been off [place] 3 months, and tried low dose invokana for few months but now off for 1 week due to cost, and back on glipizide with metformin. 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 Stop Invokana due to cost. Stop Glipizide for now. 2. Continue Metformin 1000mg  BID 3. Given list of alternative medications such as GLP1 injectables and Farxiga other SGLT2, and advised patient to check with [facility] medicare ins to find coverage options. Expressed need to make a significant change with new medication to better control DM - *UPDATE* after visit patient called office with name of med, Trulicity best coverage Tier 3, initial rx $100 then $47 monthly, he likes the weekly injectable option. Start new rx Dulaglutide (Trulicity) 0.75mg  SQ inj weekly, dispense 4 pens + 2 refills 4. Advised dietary modification low carb diet if he cannot exercise regularly 5. Handout given for CBGs, needs to check more often bring log to next visit 6.  Follow-up 3 months for Annual Physical, will get fasting labs and A1c, consider increase Trulicity to 1.5mg  weekly      Relevant Medications   Dulaglutide (TRULICITY) 0.75 MG/0.5ML SOPN   Other Relevant Orders   POCT HgB A1C (Completed)   COMPLETE METABOLIC PANEL WITH GFR   Hemoglobin A1c   Acne vulgaris    Stable mild facial acne with some cystic lesions without active pustules. - Not improved on benozyl peroxide OTC - Consider topical differin gel vs benzaclin antibiotic topical, otherwise future consider dermatology if unsuccessful, counseled today due to cost of topical meds advised start with DM rx first to make sure he can afford then in future notify me if want to try new topical         Meds ordered this encounter  Medications  . Dulaglutide (TRULICITY) 0.75 MG/0.5ML SOPN    Sig: Inject 0.75 mg into the skin once a week. Rotate injection sites weekly.    Dispense:  4 pen    Refill:  2      Follow up plan:  Return in about 3 months (around 05/09/2016) for Annual Physical.  Saralyn Pilar, DO [facility] 02/10/2016, 9:57 AM

## 2016-02-10 ENCOUNTER — Encounter: Payer: Self-pay | Admitting: Family Medicine

## 2016-02-10 NOTE — Assessment & Plan Note (Signed)
Stable mild facial acne with some cystic lesions without active pustules. - Not improved on benozyl peroxide OTC - Consider topical differin gel vs benzaclin antibiotic topical, otherwise future consider dermatology if unsuccessful, counseled today due to cost of topical meds advised start with DM rx first to make sure he can afford then in future notify me if want to try new topical

## 2016-02-10 NOTE — Assessment & Plan Note (Signed)
Stable ED without significant improvement on generic Sildenafil 100mg  PRN. Likely secondary to organic etiology with DM, HTN and medications also likely age related. - Expressing interest in branded Viagra that may work better, advised him today that this is unlikely if not responding to Sildenafil 100mg , and explained it would be very costly, given he has discontinued DM medication recently due to cost, this option is not a good idea - Continue generic PRN, focus on treating DM and if not improved in future, consider Urology referral

## 2016-02-10 NOTE — Assessment & Plan Note (Signed)
Stable to slightly improved A1c from 8.3 to 8.1, however multiple med changes has been off [place] 3 months, and tried low dose invokana for few months but now off for 1 week due to cost, and back on glipizide with metformin. 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 Stop Invokana due to cost. Stop Glipizide for now. 2. Continue Metformin 1000mg  BID 3. Given list of alternative medications such as GLP1 injectables and Farxiga other SGLT2, and advised patient to check with [facility] medicare ins to find coverage options. Expressed need to make a significant change with new medication to better control DM - *UPDATE* after visit patient called office with name of med, Trulicity best coverage Tier 3, initial rx $100 then $47 monthly, he likes the weekly injectable option. Start new rx Dulaglutide (Trulicity) 0.75mg  SQ inj weekly, dispense 4 pens + 2 refills 4. Advised dietary modification low carb diet if he cannot exercise regularly 5. Handout given for CBGs, needs to check more often bring log to next visit 6. Follow-up 3 months for Annual Physical, will get fasting labs and A1c, consider increase Trulicity to 1.5mg  weekly

## 2016-02-10 NOTE — Assessment & Plan Note (Signed)
Stable lipids last checked 04/2015 on pravastatin - Re-check future lipids at annual physical 3 mo

## 2016-02-12 ENCOUNTER — Encounter: Payer: Self-pay | Admitting: Family Medicine

## 2016-02-15 NOTE — Progress Notes (Signed)
FMLA Information Received FMLA paperwork for patient on 02/13/16 for renewal. Last completed by previous PCP Amy Krebs FNP 05/12/2015.  Requested FMLA for intermittent leave for office visit / follow-up for chronic medical condition.  Diagnosis / Indication: Uncontrolled Type 2 Diabetes, with DM neuropathy Symptoms: No active acute symptoms Job limitations: None Most recent office visit: 02/09/16 Hospitalizations: n/a Incapacitated dates: n/a  Intermittent Leave: - Start date 02/09/16 - 01/23/2017 as requested / [x]  condition is lifelong  Additional appointments necessary for treatment: Yes - Estimated treatment schedule, ferquency up to 6 times per year, 1 day per appointment  Flare-ups/Episodes - No  Dates treated - 12/06/15, 02/09/16  Completed, signed, and dated FMLA paperwork 02/13/16 to be faxed and to be scanned into chart.

## 2016-02-20 ENCOUNTER — Other Ambulatory Visit: Payer: Self-pay | Admitting: Family Medicine

## 2016-02-20 DIAGNOSIS — IMO0002 Reserved for concepts with insufficient information to code with codable children: Secondary | ICD-10-CM

## 2016-02-20 DIAGNOSIS — E1165 Type 2 diabetes mellitus with hyperglycemia: Secondary | ICD-10-CM

## 2016-05-21 ENCOUNTER — Encounter: Payer: Commercial Managed Care - HMO | Admitting: Family Medicine

## 2016-05-22 ENCOUNTER — Ambulatory Visit (INDEPENDENT_AMBULATORY_CARE_PROVIDER_SITE_OTHER): Payer: Commercial Managed Care - HMO | Admitting: Family Medicine

## 2016-05-22 ENCOUNTER — Encounter: Payer: Self-pay | Admitting: Family Medicine

## 2016-05-22 VITALS — BP 146/84 | HR 60 | Temp 98.3°F | Resp 16 | Ht 71.5 in | Wt 196.0 lb

## 2016-05-22 DIAGNOSIS — E1169 Type 2 diabetes mellitus with other specified complication: Secondary | ICD-10-CM | POA: Diagnosis not present

## 2016-05-22 DIAGNOSIS — IMO0002 Reserved for concepts with insufficient information to code with codable children: Secondary | ICD-10-CM

## 2016-05-22 DIAGNOSIS — Z0000 Encounter for general adult medical examination without abnormal findings: Secondary | ICD-10-CM

## 2016-05-22 DIAGNOSIS — Z113 Encounter for screening for infections with a predominantly sexual mode of transmission: Secondary | ICD-10-CM | POA: Diagnosis not present

## 2016-05-22 DIAGNOSIS — E785 Hyperlipidemia, unspecified: Secondary | ICD-10-CM

## 2016-05-22 DIAGNOSIS — E1165 Type 2 diabetes mellitus with hyperglycemia: Secondary | ICD-10-CM | POA: Diagnosis not present

## 2016-05-22 DIAGNOSIS — Z125 Encounter for screening for malignant neoplasm of prostate: Secondary | ICD-10-CM | POA: Diagnosis not present

## 2016-05-22 DIAGNOSIS — I10 Essential (primary) hypertension: Secondary | ICD-10-CM

## 2016-05-22 DIAGNOSIS — N529 Male erectile dysfunction, unspecified: Secondary | ICD-10-CM

## 2016-05-22 DIAGNOSIS — E1142 Type 2 diabetes mellitus with diabetic polyneuropathy: Secondary | ICD-10-CM | POA: Diagnosis not present

## 2016-05-22 NOTE — Assessment & Plan Note (Signed)
Stable ED, some improvement on other generic / OTC pills given to him by outside source (3rd party company), unsure name, has pills but no identify on them. - Continue current med PRN, focus on treating DM and if not improved in future, consider Urology referral

## 2016-05-22 NOTE — Assessment & Plan Note (Signed)
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 Discontinued Glipizide  Plan: 1. Continue meds - Trulicity 0.75mg  weekly inj (tolerating well) - await A1c result to see if effective and may need to double dose to 1.5mg  daily as discussed today 2. Continue Metformin  BID 3. Contiue check CBGs, bring record to upcoming lab visit for A1c, chemistry, lipids (did not draw labs before physical) 4. Advised due for DM eye exam, he will schedule with [facility], and have them fax [place] result 5. Continue ACEi, Statin 6. Follow-up 3 months for Annual Physical, will get fasting labs and A1c, consider increase Trulicity to 1.5mg  weekly

## 2016-05-22 NOTE — Progress Notes (Signed)
Subjective:    Patient ID: [name], male    DOB: 04/11/1945, 71 y.o.   MRN: [id number]  [name] is a 71 y.o. male presenting on 05/22/2016 for Annual Exam (Patient here for CPE reports feeling fairly well. Patient report being very active and sleeping well. )   HPI   He has no new concerns today. Here for Annual Physical. However he did not schedule a Lab Only visit ahead of time and has not had labs drawn before visit today.  CHRONIC DM, Type 2: Last visit 01/2016, he had persistently elevated A1c >8, despite several med changes in past few months, failed Januvia, Invokana. His Glipizide was discontinued by me and then he was started on Trulicity - Today he reports doing well on Trulicity 0.75mg  weekly injections, he is getting used to the injections and does not have complications, no local reaction or issues, only minor bleeding. Tolerating it well without nausea vomiting or GI upset CBGs: Does not have log today, left it at home, will drop it off next time for labs. Checks CBG 2-3x weekly, ranging around 130s now. Improved. Meds: Metformin  BID, Trulicity 0.75mg  weekly inj Reports good compliance. Tolerating well w/o side-effects Currently on ACEi, Statin Lifestyle: no significant chances since last visit - Diet (reduced portion size) - Exercise (has not started regular now, but has gym membership, limited time as bus driver) - Last DM eye exam 2017, he plans to go to Dr Clydene Pugh optometry in [place], will send [place] the record Denies hypoglycemia  CHRONIC HTN: Reports does not check BP at home, occasionally. He ran out of meds for 1 week, going today to pick up refill. Current Meds - Lisinopril-HCTZ 20-12.5mg  daily Reports good compliance, did NOT take meds today. Tolerating well, w/o complaints.  HYPERLIPIDEMIA: - Reports no concerns. Last lipid panel 04/2015, controlled on pravastatin, tolerating well without myalgias. - he did not return to get Labs drawn for  cholesterol, no new lab values - Has gained +2 lbs in 6 months, overall stable weight  Erectile Dysfunction: - Reports stable chronic problem. He was unable to afford the brand name Viagra, or generic. He actually received some samples by calling into an add on a radio station, of different medication, has some pills today does not know name of it. - Has not seen Urologist. - Admits good sexual desire  Health Maintenance: - No known personal or family history of prostate cancer. Last PSA 0.3 (10/2015), has not had DRE before by report, also denies history of BPH, and he is asymptomatic - No known personal or family history of colon cancer. Has done prior fecal occult stool cards by chart review, health maintenance incorrectly displaying requirement today, will discuss at future appointment - Due for TDAP declined today  Past Medical History:  Diagnosis Date  . Diabetes mellitus without complication (HCC)   . Erectile dysfunction   . Hyperlipidemia   . Hypertension   . Right-sided chest pain    Past Surgical History:  Procedure Laterality Date  . ROTATOR CUFF REPAIR     Social History   Social History  . Marital status: Single    Spouse name: N/A  . Number of children: N/A  . Years of education: N/A   Occupational History  . Not on file.   Social History Main Topics  . Smoking status: Former Games developer  . Smokeless tobacco: Never Used  . Alcohol use 0.6 oz/week    1 Cans of beer per week  .  Drug use: No  . Sexual activity: Not on file   Other Topics Concern  . Not on file   Social History Narrative  . No narrative on file   Family History  Problem Relation Age of Onset  . Cancer Brother     Throat   Current Outpatient Prescriptions on File Prior to Visit  Medication Sig  . Dulaglutide (TRULICITY) 0.75 MG/0.5ML SOPN Inject 0.75 mg into the skin once a week. Rotate injection sites weekly.  [name] glucose blood (ONE TOUCH ULTRA TEST) test strip Check blood sugar twice  daily. Dx:E11.9  . ibuprofen (ADVIL,MOTRIN) 600 MG tablet Take 1 tablet (600 mg total) by mouth every 8 (eight) hours as needed.  [name] lisinopril-hydrochlorothiazide (PRINZIDE,ZESTORETIC) 20-12.5 MG tablet take 1 tablet by mouth once daily  . metFORMIN (GLUCOPHAGE) 1000 MG tablet Take 1 tablet (1,000 mg total) by mouth 2 (two) times daily with a meal.  . ONETOUCH DELICA LANCETS 33G MISC 1 each by Does not apply route 3 (three) times daily.  . pravastatin (PRAVACHOL) 10 MG tablet Take 1 tablet (10 mg total) by mouth daily.  [name] selenium sulfide (SELSUN) 2.5 % shampoo Apply topically daily as needed for irritation.   No current facility-administered medications on file prior to visit.     Review of Systems  Constitutional: Negative for activity change, appetite change, chills, diaphoresis, fatigue, fever and unexpected weight change.  HENT: Negative for congestion, hearing loss and sinus pressure.   Eyes: Negative for visual disturbance.  Respiratory: Negative for cough, chest tightness, shortness of breath and wheezing.   Cardiovascular: Negative for chest pain, palpitations and leg swelling.  Gastrointestinal: Negative for abdominal pain, constipation, diarrhea, nausea and vomiting.  Endocrine: Negative for cold intolerance and polyuria.  Genitourinary: Negative for decreased urine volume, difficulty urinating, dysuria, frequency, hematuria, penile swelling and testicular pain.  Musculoskeletal: Negative for arthralgias and neck pain.  Skin: Negative for rash.  Allergic/Immunologic: Negative for environmental allergies.  Neurological: Negative for dizziness, weakness, light-headedness, numbness and headaches.  Hematological: Negative for adenopathy.  Psychiatric/Behavioral: Negative for behavioral problems, dysphoric mood and sleep disturbance. The patient is not nervous/anxious.    Per HPI unless specifically indicated above     Objective:    BP (!) 146/84 (BP Location: Left Arm, Cuff  Size: Normal)   Pulse 60   Temp 98.3 F (36.8 C) (Oral)   Resp 16   Ht 5' 11.5" (1.816 m)   Wt 196 lb (88.9 kg)   SpO2 100%   BMI 26.96 kg/m   Wt Readings from Last 3 Encounters:  05/22/16 196 lb (88.9 kg)  02/09/16 197 lb 9.6 oz (89.6 kg)  12/06/15 194 lb (88 kg)    Physical Exam  Constitutional: He is oriented to person, place, and time. He appears well-developed and well-nourished. No distress.  Well-appearing, comfortable, cooperative  HENT:  Head: Normocephalic and atraumatic.  Mouth/Throat: Oropharynx is clear and moist.  Eyes: Conjunctivae and EOM are normal. Pupils are equal, round, and reactive to light.  Neck: Normal range of motion. Neck supple. No thyromegaly present.  No carotid bruits  Cardiovascular: Normal rate, regular rhythm, normal heart sounds and intact distal pulses.   No murmur heard. Pulmonary/Chest: Effort normal and breath sounds normal. No respiratory distress. He has no wheezes. He has no rales.  Abdominal: Soft. Bowel sounds are normal. He exhibits no distension and no mass. There is no tenderness.  Genitourinary:  Genitourinary Comments: Patient declined DRE today  Musculoskeletal: Normal range of motion.  He exhibits no edema or tenderness.  Lymphadenopathy:    He has no cervical adenopathy.  Neurological: He is alert and oriented to person, place, and time.  Distal sensation to light touch intact  Skin: Skin is warm and dry. No rash noted. He is not diaphoretic. No erythema.  Psychiatric: He has a normal mood and affect. His behavior is normal.  Well groomed, good eye contact, normal speech and thoughts  Nursing note and vitals reviewed.  Results for orders placed or performed in visit on 02/09/16  POCT HgB A1C  Result Value Ref Range   Hemoglobin A1C 8.1       Assessment & Plan:   Problem List Items Addressed This Visit    Hyperlipidemia associated with type 2 diabetes mellitus (HCC)    No recent changes, did not come in for lab only  for new fasting lipids as asked. Last lipids controlled on pravastatin Awaiting fasting lab results, should return 1-2 weeks Continue pravastatin, no changes today      Essential hypertension    Mild elevated BP today, out of BP meds for past 1 week. Slight improvement on manual re-check. No known complications  Plan: 1. Continue current med - Lisinopril-HCTZ 20-12.5mg  daily, pick up refill today 2. Encourage start regular exercise, low sodium diet 3. Start checking BP at home with cuff or pharmacy, keep track of readings, if >140/90 notify office 4. Advised him to schedule Lab Only apt 1-2 weeks for labs, did not get chemistry for this annual physical today 5. Follow-up 3 months HTN      ED (erectile dysfunction) of organic origin    Stable ED, some improvement on other generic / OTC pills given to him by outside source (3rd party company), unsure name, has pills but no identify on them. 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 Discontinued Glipizide  Plan: 1. Continue meds - Trulicity 0.75mg  weekly inj (tolerating well) - await A1c result to see if effective and may need to double dose to 1.5mg  daily as discussed today 2. Continue Metformin  BID 3. Contiue check CBGs, bring record to upcoming lab visit for A1c, chemistry,  lipids (did not draw labs before physical) 4. Advised due for DM eye exam, he will schedule with [facility], and have them fax [place] result 5. Continue ACEi, Statin 6. Follow-up 3 months for Annual Physical, will get fasting labs and A1c, consider increase Trulicity to 1.5mg  weekly       Other Visit Diagnoses    Annual physical exam    -  Primary   Relevant Orders   PSA, Total with Reflex to PSA, Free   Screening PSA (prostate specific antigen)       Add PSA screening to upcoming labs. Last 0.3 (10/2015).   Relevant Orders   PSA,  Total with Reflex to PSA, Free   Screen for STD (sexually transmitted disease)       Patient request for routine STD screening via bloodwork, will add urine GC/Chlamydia next visit if requests, currently asymptomatic   Relevant Orders   HIV antibody   RPR      No orders of the defined types were placed in this encounter.    Follow up plan: Return in about 3 months (around 08/22/2016) for diabetes, blood pressure.  Saralyn Pilar, DO [facility] 05/22/2016, 3:00 PM

## 2016-05-22 NOTE — Patient Instructions (Signed)
Thank you for coming to the clinic today.  1. No changes today Keep up good work with medicines Plan to start regular exercise at gym Improve hydration and continue reduced portion size of food, limit sugar and carbs  2. You will be due for FASTING BLOOD WORK (no food or drink after midnight before, only water or coffee without cream/sugar on the morning of)  - Please go ahead and schedule a "Lab Only" visit in the morning at the clinic for lab draw in 1-2 weeks  Please schedule a follow-up appointment with Dr. Althea Charon in 3 months DM A1c, HTN  If you have any other questions or concerns, please feel free to call the clinic or send a message through MyChart. You may also schedule an earlier appointment if necessary.  Saralyn Pilar, DO [facility], [place]

## 2016-05-22 NOTE — Assessment & Plan Note (Signed)
Mild elevated BP today, out of BP meds for past 1 week. Slight improvement on manual re-check. No known complications  Plan: 1. Continue current med - Lisinopril-HCTZ 20-12.5mg  daily, pick up refill today 2. Encourage start regular exercise, low sodium diet 3. Start checking BP at home with cuff or pharmacy, keep track of readings, if >140/90 notify office 4. Advised him to schedule Lab Only apt 1-2 weeks for labs, did not get chemistry for this annual physical today 5. Follow-up 3 months HTN

## 2016-05-22 NOTE — Assessment & Plan Note (Signed)
No recent changes, did not come in for lab only for new fasting lipids as asked. Last lipids controlled on pravastatin Awaiting fasting lab results, should return 1-2 weeks Continue pravastatin, no changes today

## 2016-06-03 ENCOUNTER — Other Ambulatory Visit: Payer: Commercial Managed Care - HMO

## 2016-06-03 DIAGNOSIS — Z125 Encounter for screening for malignant neoplasm of prostate: Secondary | ICD-10-CM | POA: Diagnosis not present

## 2016-06-03 DIAGNOSIS — E113293 Type 2 diabetes mellitus with mild nonproliferative diabetic retinopathy without macular edema, bilateral: Secondary | ICD-10-CM | POA: Diagnosis not present

## 2016-06-03 DIAGNOSIS — Z113 Encounter for screening for infections with a predominantly sexual mode of transmission: Secondary | ICD-10-CM | POA: Diagnosis not present

## 2016-06-03 DIAGNOSIS — H018 Other specified inflammations of eyelid: Secondary | ICD-10-CM | POA: Diagnosis not present

## 2016-06-03 DIAGNOSIS — H2513 Age-related nuclear cataract, bilateral: Secondary | ICD-10-CM | POA: Diagnosis not present

## 2016-06-03 DIAGNOSIS — Z0000 Encounter for general adult medical examination without abnormal findings: Secondary | ICD-10-CM | POA: Diagnosis not present

## 2016-06-03 LAB — HM DIABETES EYE EXAM

## 2016-06-04 LAB — PSA, TOTAL WITH REFLEX TO PSA, FREE: PSA, Total: 0.3 ng/mL (ref <=4.0)

## 2016-06-04 LAB — RPR

## 2016-06-04 LAB — HIV ANTIBODY (ROUTINE TESTING W REFLEX): HIV: NONREACTIVE

## 2016-06-05 ENCOUNTER — Encounter: Payer: Commercial Managed Care - HMO | Admitting: Family Medicine

## 2016-06-18 ENCOUNTER — Encounter: Payer: Self-pay | Admitting: Family Medicine

## 2016-06-18 ENCOUNTER — Telehealth: Payer: Self-pay | Admitting: Family Medicine

## 2016-06-18 NOTE — Telephone Encounter (Signed)
Pt.called states that trulicity 0.75 was costing 2? ? that was to expensive  Wanted to  Know if something else cold be called in.

## 2016-06-18 NOTE — Telephone Encounter (Signed)
Called patient today 5/29, to review the following with him, he understands and is a little confused by the medicare coverage, he will call insurance next, will send him mychart message with this in writing first. ------------------  Patient has been on Trulicity since 01/2016, after he checked with his insurance to see which of these medicines was the preferred option. Initially the price was affordable.  I called the [facility] pharmacy to clarify this, and they report that he is in the Medicare "Coverage Gap" or "donut hole", this is reason his med is not being covered and cost is >$300. There are no other medication options in this class of meds that will be cheaper.  I do not have many alternative options. I do know that he was unable to get a different medication covered in the past (Invokana). Perhaps, he can call his insurance company to check the coverage and cost of the following other diabetes meds:  1. Farxiga (Dapagliflozin) 2. Jardiance (Empagliflozin)  Otherwise, one other option may be to refer him to an Endocrinologist office that offers samples or other medication assistance. I do not know if [facility] or [facility] offers this option.  Saralyn PilarAlexander [name], DO [facility] 06/18/2016, 5:35 PM

## 2016-07-22 ENCOUNTER — Other Ambulatory Visit: Payer: Self-pay | Admitting: Family Medicine

## 2016-07-22 ENCOUNTER — Telehealth: Payer: Self-pay | Admitting: Family Medicine

## 2016-07-22 DIAGNOSIS — IMO0002 Reserved for concepts with insufficient information to code with codable children: Secondary | ICD-10-CM

## 2016-07-22 DIAGNOSIS — E1142 Type 2 diabetes mellitus with diabetic polyneuropathy: Secondary | ICD-10-CM

## 2016-07-22 DIAGNOSIS — E1165 Type 2 diabetes mellitus with hyperglycemia: Principal | ICD-10-CM

## 2016-07-22 NOTE — Telephone Encounter (Signed)
I am not familiar with this product. If he finds any specific brand or version of this from pharmacy, they could send us a prescription request, or he may contact insurance to determine the cost.  Saralyn PilarAlexander Christena Sunderlin, DO [facility] 07/22/2016, 5:09 PM

## 2016-07-22 NOTE — Telephone Encounter (Signed)
Pt asked about glucose meter that just touch your skin and not prick finger.  His call back number is [phone]

## 2016-07-23 NOTE — Telephone Encounter (Signed)
Pt.notified

## 2016-08-02 ENCOUNTER — Encounter: Payer: Self-pay | Admitting: Family Medicine

## 2016-08-02 DIAGNOSIS — E11319 Type 2 diabetes mellitus with unspecified diabetic retinopathy without macular edema: Secondary | ICD-10-CM | POA: Insufficient documentation

## 2016-08-07 ENCOUNTER — Other Ambulatory Visit: Payer: Self-pay | Admitting: Family Medicine

## 2016-08-07 MED ORDER — NAPROXEN 500 MG PO TABS: 500.0000 mg | ORAL_TABLET | Freq: Two times a day (BID) | ORAL | 1 refills | Status: DC

## 2016-08-07 NOTE — Telephone Encounter (Signed)
Received fax on prior naproxen, request refill, [facility]  Saralyn PilarAlexander [name], DO [facility] 08/07/2016, 5:21 PM

## 2016-08-14 ENCOUNTER — Telehealth: Payer: Self-pay | Admitting: Family Medicine

## 2016-08-14 DIAGNOSIS — E1142 Type 2 diabetes mellitus with diabetic polyneuropathy: Secondary | ICD-10-CM

## 2016-08-14 DIAGNOSIS — E1165 Type 2 diabetes mellitus with hyperglycemia: Principal | ICD-10-CM

## 2016-08-14 DIAGNOSIS — IMO0002 Reserved for concepts with insufficient information to code with codable children: Secondary | ICD-10-CM

## 2016-08-14 MED ORDER — DULAGLUTIDE 0.75 MG/0.5ML ~~LOC~~ SOAJ: SUBCUTANEOUS | 5 refills | Status: DC

## 2016-08-14 NOTE — Telephone Encounter (Signed)
Pt needs a refill on trulicity sent to [facility] [place].  Please note the pharmacy change.  His call back number is [phone]

## 2016-08-14 NOTE — Telephone Encounter (Signed)
[facility] to [facility] as [facility]  Saralyn PilarAlexander Aiven Kampe, DO [facility] 08/14/2016, 12:02 PM

## 2016-08-15 ENCOUNTER — Other Ambulatory Visit: Payer: Self-pay

## 2016-08-15 ENCOUNTER — Telehealth: Payer: Self-pay | Admitting: Family Medicine

## 2016-08-15 DIAGNOSIS — E1142 Type 2 diabetes mellitus with diabetic polyneuropathy: Secondary | ICD-10-CM

## 2016-08-15 DIAGNOSIS — E1165 Type 2 diabetes mellitus with hyperglycemia: Principal | ICD-10-CM

## 2016-08-15 DIAGNOSIS — IMO0002 Reserved for concepts with insufficient information to code with codable children: Secondary | ICD-10-CM

## 2016-08-15 MED ORDER — DULAGLUTIDE 0.75 MG/0.5ML ~~LOC~~ SOAJ: SUBCUTANEOUS | 5 refills | Status: DC

## 2016-08-15 NOTE — Telephone Encounter (Signed)
Send Rx pt is aware.

## 2016-08-15 NOTE — Telephone Encounter (Signed)
Pt called requesting a refill on trulicity called into   [facility] in [place]

## 2016-08-30 ENCOUNTER — Ambulatory Visit: Payer: Commercial Managed Care - HMO | Admitting: Family Medicine

## 2016-08-30 ENCOUNTER — Ambulatory Visit (INDEPENDENT_AMBULATORY_CARE_PROVIDER_SITE_OTHER): Payer: Medicare HMO | Admitting: Family Medicine

## 2016-08-30 ENCOUNTER — Encounter: Payer: Self-pay | Admitting: Family Medicine

## 2016-08-30 ENCOUNTER — Other Ambulatory Visit: Payer: Self-pay | Admitting: Family Medicine

## 2016-08-30 VITALS — BP 135/78 | HR 59 | Temp 98.4°F | Resp 16 | Ht 71.0 in | Wt 194.0 lb

## 2016-08-30 DIAGNOSIS — E1142 Type 2 diabetes mellitus with diabetic polyneuropathy: Secondary | ICD-10-CM | POA: Diagnosis not present

## 2016-08-30 DIAGNOSIS — E1165 Type 2 diabetes mellitus with hyperglycemia: Principal | ICD-10-CM

## 2016-08-30 DIAGNOSIS — L918 Other hypertrophic disorders of the skin: Secondary | ICD-10-CM

## 2016-08-30 DIAGNOSIS — E785 Hyperlipidemia, unspecified: Secondary | ICD-10-CM

## 2016-08-30 DIAGNOSIS — I10 Essential (primary) hypertension: Secondary | ICD-10-CM | POA: Diagnosis not present

## 2016-08-30 DIAGNOSIS — E1169 Type 2 diabetes mellitus with other specified complication: Secondary | ICD-10-CM

## 2016-08-30 DIAGNOSIS — N529 Male erectile dysfunction, unspecified: Secondary | ICD-10-CM

## 2016-08-30 DIAGNOSIS — R799 Abnormal finding of blood chemistry, unspecified: Secondary | ICD-10-CM

## 2016-08-30 DIAGNOSIS — IMO0002 Reserved for concepts with insufficient information to code with codable children: Secondary | ICD-10-CM

## 2016-08-30 DIAGNOSIS — E113293 Type 2 diabetes mellitus with mild nonproliferative diabetic retinopathy without macular edema, bilateral: Secondary | ICD-10-CM

## 2016-08-30 LAB — POCT GLYCOSYLATED HEMOGLOBIN (HGB A1C): Hemoglobin A1C: 7.8 — AB (ref ?–5.7)

## 2016-08-30 MED ORDER — DULAGLUTIDE 1.5 MG/0.5ML ~~LOC~~ SOAJ: 1.5000 mg | SUBCUTANEOUS | 11 refills | Status: DC

## 2016-08-30 NOTE — Assessment & Plan Note (Signed)
Stable ED, seems dependent on generic Viagra 100mg  (mail order, no active rx) Offered other generic PDE5 inhibitor options, due to cost he will continue with mail order Offer urology referral, declined at this time

## 2016-08-30 NOTE — Progress Notes (Signed)
Subjective:    Patient ID: [name], male    DOB: Sep 27, 1945, 71 y.o.   MRN: [id number]  [name] is a 71 y.o. male presenting on 08/30/2016 for Hypertension and Diabetes (highest 200 and lowest 124 )   HPI   CHRONIC DM, Type 2: Last visit 05/22/16, he had improved A1c, he has been adhering mostly to Trulicity. Explains financial difficulty with limited cost/coverage from insurance [employer] medicare due to "donut hole", he was paying up to $250 per 1 month supply and then he had to skip some months but is back on it. CBGs: Does not have log today. Checks CBG 2-3x weekly, ranging around 120-140s Meds: Metformin 9379KW BID, Trulicity 4.09BD weekly inj - has 3-4 more pens left on current supply Reports good compliance. Tolerating well w/o side-effects Currently on ACEi, Statin Lifestyle: no significant chances since last visit - Diet (tries to eat healthier, limit portions, not always following DM diet) - Exercise (has free gym membership but not going regularly, limited time as bus driver) - Followed by Dr [name] for routine DM Eye Exams, has history of retinopathy Denies hypoglycemia, worsening burning numbness or tingling in feet  CHRONIC HTN: Reports no concerns, has not run out of meds Current Meds - Lisinopril-HCTZ 20-12.17m daily   Reports good compliance, took meds today. Tolerating well, w/o complaints. Denies CP, dyspnea, HA, edema, dizziness / lightheadedness  Erectile Dysfunction: - Last visit with me 05/22/16, for discussed ED, he has continued with mail order generic Viagra, see prior notes for background information. - Today patient reports concern that if he does not take Viagra (sildenafil 1012m he has erectile problems, does not seem improved, he is asking about other options and cost. He is not interested in other generic viagra since his cost is fine with the mail order supply. - Has never seen Urologist - Admits good sexual desire  Health Maintenance: - Due  for colon CA screening. Last had fecal occult cards done in 2015. Previously with prior PCP Amy Krebs 10/2015, ordered Cologuard and he has kit at home but did not collect sample, he is not sure if it is still good. He can contact company. Denies any new GI symptoms - Due for TDap, defers - Due for Flu Shot in Fall 2018  Social History  Substance Use Topics  . Smoking status: Current Some Day Smoker  . Smokeless tobacco: Current User  . Alcohol use 0.6 oz/week    1 Cans of beer per week    Review of Systems Per HPI unless specifically indicated above     Objective:    BP 135/78   Pulse (!) 59   Temp 98.4 F (36.9 C) (Oral)   Resp 16   Ht 5' 11"  (1.803 m)   Wt 194 lb (88 kg)   BMI 27.06 kg/m   Wt Readings from Last 3 Encounters:  08/30/16 194 lb (88 kg)  05/22/16 196 lb (88.9 kg)  02/09/16 197 lb 9.6 oz (89.6 kg)    Physical Exam  Constitutional: He is oriented to person, place, and time. He appears well-developed and well-nourished. No distress.  Well-appearing, comfortable, cooperative  HENT:  Head: Normocephalic and atraumatic.  Mouth/Throat: Oropharynx is clear and moist.  Eyes: Conjunctivae are normal. Right eye exhibits no discharge. Left eye exhibits no discharge.  Neck: Normal range of motion. Neck supple. No thyromegaly present.  Cardiovascular: Normal rate, regular rhythm, normal heart sounds and intact distal pulses.   No murmur heard. Pulmonary/Chest: Effort  normal and breath sounds normal. No respiratory distress. He has no wheezes. He has no rales.  Musculoskeletal: Normal range of motion. He exhibits no edema.  Lymphadenopathy:    He has no cervical adenopathy.  Neurological: He is alert and oriented to person, place, and time.  Skin: Skin is warm and dry. No rash noted. He is not diaphoretic. No erythema.  Upper back midline 1 cm soft mobile skin tag, non tender  Psychiatric: He has a normal mood and affect. His behavior is normal.  Well groomed, good  eye contact, normal speech and thoughts  Nursing note and vitals reviewed.   Recent Labs  10/31/15 1429 02/09/16 1400 08/30/16 1434  HGBA1C 8.3 8.1 7.8*    Results for orders placed or performed in visit on 08/30/16  POCT HgB A1C  Result Value Ref Range   Hemoglobin A1C 7.8 (A) 5.7      Assessment & Plan:   Problem List Items Addressed This Visit    Essential hypertension    Improved BP control, no adhering to meds - Home BP readings none  No known complications   Plan:  1. Continue current BP regimen - Lisinopril-HCTZ 20-12.62m daily 2. Encourage improved lifestyle - low sodium diet, needs to restart regular exercise 3. Start monitor BP outside office, bring readings to next visit, if persistently >140/90 or new symptoms notify office sooner 4. Follow-up 3 months, labs      ED (erectile dysfunction) of organic origin    Stable ED, seems dependent on generic Viagra 1022m(mail order, no active rx) Offered other generic PDE5 inhibitor options, due to cost he will continue with mail order Offer urology referral, declined at this time      DM (diabetes mellitus), type 2, uncontrolled ([place] [facility]    Improved control DM with A1c 7.8 (from prior 8.5.1>8.8Complications - DM retinopathy  Plan: 1. Increase Trulicity from 0.4.16SAeekly to 1.75m80meekly inj - printed rx, cost should be same patient to check before filling, also can check with ins for other GLP1 options for better cost option, he is financially limited and in medicare donut hole, also given handout info for MAP (through [facility]) for financial assistance 2. Encourage improved lifestyle - low carb, low sugar diet, reduce portion size, start regular exercise 3. Check CBG, bring log to next visit for review 4. Continue ASA, ACEi, Statin 5. Next visit due for DM Foot exam done / UTD Dm eye exam 6. Follow-up 3 months - labs including A1c      Relevant Medications   Dulaglutide 1.5 MG/0.5ML SOPN   Other Relevant  Orders   POCT HgB A1C (Completed)   Diabetic retinopathy (HCC)    Improved A1c Followed by Dr WooEllin Mayhewr retinopathy monitoring      Relevant Medications   Dulaglutide 1.5 MG/0.5ML SOPN    Other Visit Diagnoses    Skin tag       Benign appearing upper midline back skin tag, no history of ulceratino or bleeding. Monitor for now, consider shave biopsy in future and send specimen if needed      Meds ordered this encounter  Medications  . Dulaglutide 1.5 MG/0.5ML SOPN    Sig: Inject 1.5 mg into the skin once a week.    Dispense:  4 pen    Refill:  11   Follow up plan: Return in about 3 months (around 11/30/2016) for Medicare Physical CPE.  AleNobie PutnamO [facility]  08/30/2016, 2:56 PM

## 2016-08-30 NOTE — Assessment & Plan Note (Signed)
Improved A1c Followed by Dr Clydene PughWoodard for retinopathy monitoring

## 2016-08-30 NOTE — Patient Instructions (Addendum)
Thank you for coming to the clinic today.  1.  Printed rx for STRONGER dose Trulicity 1.5 (instead of 0.75) same instructions, once weekly, finish old medicine first. Cost should not be more, if it is and unaffordable, then notify office if you need a new refill on the [place] ask about coverage for the following 2 injectable meds INSTEAD of Trulcity  1. Bydureon BCise (Exenatide ER) - once weekly - this is my preference, very good medicine well tolerated, less side effects of nausea, upset stomach. No dose changes. Cost and coverage is the problem, but we may be able to get it with the coupon card  2. Victoza (Liraglutide) - once DAILY - 3 dose changes 0.6, 1.2 and 1.8, side effects nausea, upset stomach higher on this one but it is still very effective medicine  ------------------------------------------- You may meet the criteria for the Medication Assistance Program (MAP) through [facility]. Here is the website to read more about it:   F[employer]de   [address], [place], [place] [address] Phone: [phone]  Try reaching out to the [facility] through [facility] first, and complete their application, discuss your options. If this works, you can notify me when ready and usually there is some paperwork for [place] to complete and we re-print your prescriptions and submit them back, there is some delay with this, but if it works they may be covered.   DUE for FASTING BLOOD WORK (no food or drink after midnight before the lab appointment, only water or coffee without cream/sugar on the morning of)  SCHEDULE "Lab Only" visit in the morning at the clinic for lab draw in 3 months  - Make sure Lab Only appointment is at about 1 week before your next appointment, so that results will be  available  For Lab Results, once available within 2-3 days of blood draw, you can can log in to MyChart online to view your results and a brief explanation. Also, we can discuss results at next follow-up visit.  Colon Cancer Screening: - For all adults age 49+ routine colon cancer screening is highly recommended.     - Recent guidelines from [place] recommend starting age of 18 - Early detection of colon cancer is important, because often there are no warning signs or symptoms, also if found early usually it can be cured. Late stage is hard to treat.  - If you are not interested in Colonoscopy screening (if done and normal you could be cleared for 5 to 10 years until next due), then Cologuard is an excellent alternative for screening test for Colon Cancer. It is highly sensitive for detecting DNA of colon cancer from even the earliest stages. Also, there is NO bowel prep required. - If Cologuard is NEGATIVE, then it is good for 3 years before next due - If Cologuard is POSITIVE, then it is strongly advised to get a Colonoscopy, which allows the GI doctor to locate the source of the cancer or polyp (even very early stage) and treat it by removing it. ------------------------- If you would like to proceed with Cologuard (stool DNA test) - FIRST, call your insurance company and tell them you want to check cost of Cologuard tell them CPT Code [phone] (it may be completely covered and you could get for no cost, OR max cost without any coverage is about $600). Also, keep in mind if you do  NOT open the kit, and decide not to do the test, you will NOT be charged, you should contact the company if you decide not to do the test. - If you want to proceed, you can notify us (phone message, [place], or at next visit) and we will order it for you. The test kit will be delivered to you house within about 1 week. Follow instructions to collect sample, you may call the company for any help or  questions, 24/7 telephone support at [phone].  Please schedule a Follow-up Appointment to: Return in about 3 months (around 11/30/2016) for Medicare Physical CPE.  If you have any other questions or concerns, please feel free to call the clinic or send a message through [place]. You may also schedule an earlier appointment if necessary.  Additionally, you may be receiving a survey about your experience at our clinic within a few days to 1 week by e-mail or mail. We value your feedback.  Nobie Putnam, DO [place]

## 2016-08-30 NOTE — Assessment & Plan Note (Addendum)
Improved BP control, no adhering to meds - Home BP readings none  No known complications   Plan:  1. Continue current BP regimen - Lisinopril-HCTZ 20-12.5mg  daily 2. Encourage improved lifestyle - low sodium diet, needs to restart regular exercise 3. Start monitor BP outside office, bring readings to next visit, if persistently >140/90 or new symptoms notify office sooner 4. Follow-up 3 months, labs

## 2016-08-30 NOTE — Assessment & Plan Note (Signed)
Improved control DM with A1c 7.8 (from prior 8.3>8.1) Complications - DM retinopathy  Plan: 1. Increase Trulicity from 0.75mg  weekly to 1.5mg  weekly inj - printed rx, cost should be same patient to check before filling, also can check with ins for other GLP1 options for better cost option, he is financially limited and in medicare donut hole, also given handout info for MAP (through [facility]) for financial assistance 2. Encourage improved lifestyle - low carb, low sugar diet, reduce portion size, start regular exercise 3. Check CBG, bring log to next visit for review 4. Continue ASA, ACEi, Statin 5. Next visit due for DM Foot exam done / UTD Dm eye exam 6. Follow-up 3 months - labs including A1c

## 2016-10-24 ENCOUNTER — Other Ambulatory Visit: Payer: Self-pay | Admitting: Family Medicine

## 2016-10-24 DIAGNOSIS — E11319 Type 2 diabetes mellitus with unspecified diabetic retinopathy without macular edema: Secondary | ICD-10-CM

## 2016-10-24 DIAGNOSIS — IMO0002 Reserved for concepts with insufficient information to code with codable children: Secondary | ICD-10-CM

## 2016-10-24 DIAGNOSIS — E1165 Type 2 diabetes mellitus with hyperglycemia: Principal | ICD-10-CM

## 2016-10-24 MED ORDER — GLIMEPIRIDE 2 MG PO TABS: 2.0000 mg | ORAL_TABLET | Freq: Every day | ORAL | 3 refills | Status: DC

## 2016-11-07 ENCOUNTER — Other Ambulatory Visit: Payer: Self-pay | Admitting: Family Medicine

## 2016-11-07 ENCOUNTER — Telehealth: Payer: Self-pay

## 2016-11-07 DIAGNOSIS — E1165 Type 2 diabetes mellitus with hyperglycemia: Principal | ICD-10-CM

## 2016-11-07 DIAGNOSIS — IMO0001 Reserved for inherently not codable concepts without codable children: Secondary | ICD-10-CM

## 2016-11-07 NOTE — Telephone Encounter (Signed)
Patient called requesting a Freestyle Libre be [facility] [place].

## 2016-11-08 ENCOUNTER — Other Ambulatory Visit: Payer: Self-pay | Admitting: Family Medicine

## 2016-11-08 NOTE — Telephone Encounter (Signed)
Called patient advised that verbal was given to [facility] pharmacy.

## 2016-11-08 NOTE — Telephone Encounter (Signed)
Patient called back wanting to know if this was completed.  Please call patient when done.  Thank you

## 2016-12-02 ENCOUNTER — Encounter: Payer: Self-pay | Admitting: Family Medicine

## 2016-12-02 ENCOUNTER — Ambulatory Visit (INDEPENDENT_AMBULATORY_CARE_PROVIDER_SITE_OTHER): Payer: Medicare HMO | Admitting: Family Medicine

## 2016-12-02 VITALS — BP 130/80 | HR 55 | Temp 97.5°F | Resp 16 | Ht 71.5 in | Wt 205.0 lb

## 2016-12-02 DIAGNOSIS — I10 Essential (primary) hypertension: Secondary | ICD-10-CM

## 2016-12-02 DIAGNOSIS — E1165 Type 2 diabetes mellitus with hyperglycemia: Secondary | ICD-10-CM

## 2016-12-02 DIAGNOSIS — E1169 Type 2 diabetes mellitus with other specified complication: Secondary | ICD-10-CM

## 2016-12-02 DIAGNOSIS — IMO0002 Reserved for concepts with insufficient information to code with codable children: Secondary | ICD-10-CM

## 2016-12-02 DIAGNOSIS — E11319 Type 2 diabetes mellitus with unspecified diabetic retinopathy without macular edema: Secondary | ICD-10-CM | POA: Diagnosis not present

## 2016-12-02 DIAGNOSIS — E785 Hyperlipidemia, unspecified: Secondary | ICD-10-CM | POA: Diagnosis not present

## 2016-12-02 MED ORDER — PRAVASTATIN SODIUM 10 MG PO TABS: 10.0000 mg | ORAL_TABLET | Freq: Every day | ORAL | 3 refills | Status: DC

## 2016-12-02 NOTE — Patient Instructions (Addendum)
Thank you for coming to the clinic today.  1.   CALL [employer] ask about coverage for the following  - Farxiga - Jardiance - Invokana  COLONOSCOPY  Let me know if covered. These are 1 pill daily meds to lower sugar out in the urine. Need to stay well hydrated if taking these pills  For now continue Glimepiride 69m once daily and Metformin 10062mtwice daily  2.  Please check status of your existing Cologuard and if it still is good then proceed with the test  Colon Cancer Screening: - For all adults age 70+outine colon cancer screening is highly recommended.     - Recent guidelines [place] age [place] 70 Early detection of colon cancer is important, because often there are no warning signs or symptoms, also if found early usually it can be cured. Late stage is hard to treat. ------------------------- If you would like to proceed with Cologuard (stool DNA test) - FIRST, call your insurance company and tell them you want to check cost of Cologuard tell them CPT Code [phone] may be completely covered and you could get for no cost, OR max cost without any coverage is about $600). Also, keep in mind if you do NOT open the kit, and decide not to do the test, you will NOT be charged, you should contact the company if you decide not to do the test. - If you want to proceed, you can notify [place] message, [place] at next visit) and we will order it for you. The test kit will be delivered to you house within about 1 week. Follow instructions to collect sample, you may call the company for any help or questions, 24/7 telephone support at [phone] DUE for FASTING BLOOD WORK (no food or drink after midnight before the lab appointment, only water or coffee without cream/sugar on the morning of)  SCHEDULE "Lab Only" visit in the morning at the clinic for lab draw in 1 WEEKS  - Make sure Lab Only appointment is at about 1 week before your  next appointment, so that results will be available  For Lab Results, once available within 2-3 days of blood draw, you can can log in to MyChart online to view your results and a brief explanation. Also, we can discuss results at next follow-up visit.  Please schedule a Follow-up Appointment to: Return in about 3 months (around 03/04/2017) for DM A1c.  If you have any other questions or concerns, please feel free to call the clinic or send a message through [place] may also schedule an earlier appointment if necessary.  Additionally, you may be receiving a survey about your experience at our clinic within a few days to 1 week by e-mail or mail. We value your feedback.  AlNobie PutnamDO [facility]

## 2016-12-02 NOTE — Progress Notes (Signed)
Subjective:    Patient ID: [name], male    DOB: April 17, 1945, 71 y.o.   MRN: [id number]  [name] is a 71 y.o. male presenting on 12/02/2016 for Annual Exam and Diabetes   HPI   Here for Annual Physical. He did not get lab work completed before visit.  CHRONIC DM, Type 2: Reports concern cannot afford Trulicity in donut hole through medicare, has been off for few months (>2-3) cannot pay $250, he is unsure if will be covered in 2019. No other GLP1 were option last time he checked. Also looked into MAP but states he "does not qualify" due to income. CBGs: Avg 140, Low 120, High < 175. Checks CBGs most days Meds: Metformin 1020m BID, Glimepiride 274mdaily (he had additional from before) - HOLDING Trulicity 1.5 [place] weekly Reports good compliance. Tolerating well w/o side-effects Currently on ACEi Lifestyle: - Diet (Trying to improve diet, not always following)  - Exercise (Limited exercise due to time, has gym membership but not going regularly) - Dr WoEllin Mayhewast DM Eye exam 05/2016, history of DM retinopathy Denies hypoglycemia  CHRONIC HTN: Reports no new concern. Has some fluctuating BP Current Meds - Lisinopril-HCTZ 20-12.2m33maily Reports good compliance, took meds today. Tolerating well, w/o complaints.  HYPERLIPIDEMIA: - Reports concern that he ran out of Pravastatin 51m84mily despite having refill listed. Last lipid panel 04/2015, - Previously taking Pravastatin 51mg59mly without myalgia  Sebaceous Cyst, Back Reports additional concern today with "knot" on middle of low back, unsure duration, wanted it checked out, not followed by Dermatologist. Has skin tag of upper back  Health Maintenance: - UTD Flu Shot 11/07/16 - UTD Prostate CA Screening, last PSA 05/2016, negative. - Colon CA Screening: Never had colonoscopy. He was given Cologuard order before, but non adherent to it and has kit at home just never sent it. Currently asymptomatic. No known family history of  colon CA. Last screening was FOBT cards in 2015 negative. Due for screening test he will reconsider cologuard or check cost coverage of colonoscopy with insurance and notify us [place] ready for GI referral  Depression screen [facility]11/12/2016 05/22/2016 02/09/2016  Decreased Interest 0 0 0  Down, Depressed, Hopeless 0 0 0  PHQ - 2 Score 0 0 0  Altered sleeping - 0 -  Tired, decreased energy - 0 -  Change in appetite - 0 -  Feeling bad or failure about yourself  - 0 -  Trouble concentrating - 0 -  Moving slowly or fidgety/restless - 0 -  Suicidal thoughts - 0 -  PHQ-9 Score - 0 -  Difficult doing work/chores - Not difficult at all -    Past Medical History:  Diagnosis Date  . Erectile dysfunction   . Hyperlipidemia   . Hypertension   . Right-sided chest pain    Past Surgical History:  Procedure Laterality Date  . ROTATOR CUFF REPAIR     Social History   Socioeconomic History  . Marital status: Single    Spouse name: Not on file  . Number of children: Not on file  . Years of education: Not on file  . Highest education level: Not on file  Social Needs  . Financial resource strain: Not on file  . Food insecurity - worry: Not on file  . Food insecurity - inability: Not on file  . Transportation needs - medical: Not on file  . Transportation needs - non-medical: Not on file  Occupational History  .  Not on file  Tobacco Use  . Smoking status: Current Some Day Smoker  . Smokeless tobacco: Current User  Substance and Sexual Activity  . Alcohol use: Yes    Alcohol/week: 0.6 oz    Types: 1 Cans of beer per week  . Drug use: No  . Sexual activity: Not on file  Other Topics Concern  . Not on file  Social History Narrative  . Not on file   Family History  Problem Relation Age of Onset  . Cancer Brother        Throat   Current Outpatient Medications on File Prior to Visit  Medication Sig  . glimepiride (AMARYL) 2 MG tablet Take 1 tablet (2 mg total) by mouth daily before  breakfast.  . ibuprofen (ADVIL,MOTRIN) 600 MG tablet Take 1 tablet (600 mg total) by mouth every 8 (eight) hours as needed.  [name] lisinopril-hydrochlorothiazide (PRINZIDE,ZESTORETIC) 20-12.5 MG tablet take 1 tablet by mouth once daily  . metFORMIN (GLUCOPHAGE) 1000 MG tablet TAKE 1 TABLET BY MOUTH TWICE A DAY WITH MEALS  . naproxen (NAPROSYN) 500 MG tablet Take 1 tablet (500 mg total) by mouth 2 (two) times daily with a meal. For 2-4 weeks then as needed  . selenium sulfide (SELSUN) 2.5 % shampoo Apply topically daily as needed for irritation.  [name] glucose blood (ONE TOUCH ULTRA TEST) test strip Check blood sugar twice daily. Dx:E11.9 (Patient not taking: Reported on 12/02/2016)  . ONETOUCH DELICA LANCETS 00P MISC 1 each by Does not apply route 3 (three) times daily. (Patient not taking: Reported on 12/02/2016)   No current facility-administered medications on file prior to visit.     Review of Systems  Constitutional: Negative for activity change, appetite change, chills, diaphoresis, fatigue and fever.  HENT: Negative for congestion and hearing loss.   Eyes: Negative for visual disturbance.  Respiratory: Negative for apnea, cough, choking, chest tightness, shortness of breath and wheezing.   Cardiovascular: Negative for chest pain, palpitations and leg swelling.  Gastrointestinal: Negative for abdominal distention, abdominal pain, anal bleeding, blood in stool, constipation, diarrhea, nausea and vomiting.  Endocrine: Negative for cold intolerance and polyuria.  Genitourinary: Positive for urgency (occasional). Negative for decreased urine volume, difficulty urinating, dysuria, frequency, hematuria, scrotal swelling and testicular pain.  Musculoskeletal: Negative for arthralgias, back pain and neck pain.  Skin: Negative for rash.  Allergic/Immunologic: Negative for environmental allergies.  Neurological: Negative for dizziness, weakness, light-headedness, numbness and headaches.  Hematological:  Negative for adenopathy.  Psychiatric/Behavioral: Negative for behavioral problems, dysphoric mood and sleep disturbance.   Per HPI unless specifically indicated above     Objective:    BP 130/80   Pulse (!) 55   Temp (!) 97.5 F (36.4 C) (Oral)   Resp 16   Ht 5' 11.5" (1.816 m)   Wt 205 lb (93 kg)   BMI 28.19 kg/m   Wt Readings from Last 3 Encounters:  12/02/16 205 lb (93 kg)  08/30/16 194 lb (88 kg)  05/22/16 196 lb (88.9 kg)    Physical Exam  Constitutional: He is oriented to person, place, and time. He appears well-developed and well-nourished. No distress.  Well-appearing, comfortable, cooperative  HENT:  Head: Normocephalic and atraumatic.  Mouth/Throat: Oropharynx is clear and moist.  Eyes: Conjunctivae and EOM are normal. Pupils are equal, round, and reactive to light. Right eye exhibits no discharge. Left eye exhibits no discharge.  Neck: Normal range of motion. Neck supple. No thyromegaly present.  Cardiovascular: Normal rate, regular rhythm,  normal heart sounds and intact distal pulses.  No murmur heard. Pulmonary/Chest: Effort normal and breath sounds normal. No respiratory distress. He has no wheezes. He has no rales.  Abdominal: Soft. Bowel sounds are normal. He exhibits no distension and no mass. There is no tenderness.  Musculoskeletal: Normal range of motion. He exhibits no edema or tenderness.  Upper / Lower Extremities: - Normal muscle tone, strength bilateral upper extremities 5/5, lower extremities 5/5  Lymphadenopathy:    He has no cervical adenopathy.  Neurological: He is alert and oriented to person, place, and time.  Distal sensation intact to light touch all extremities  Skin: Skin is warm and dry. No rash noted. He is not diaphoretic. No erythema.  Some thickening of bilateral great toenails  Midline lower thoracic spine with central approx 2 x 2 cm raised firm but mobile sebaceous cyst with central pore, non tender, no drainage, no erythema    Psychiatric: He has a normal mood and affect. His behavior is normal.  Well groomed, good eye contact, normal speech and thoughts  Nursing note and vitals reviewed.  Diabetic Foot Exam - Simple   Simple Foot Form Diabetic Foot exam was performed with the following findings:  Yes 12/02/2016  6:42 PM  Visual Inspection No deformities, no ulcerations, no other skin breakdown bilaterally:  Yes Sensation Testing Intact to touch and monofilament testing bilaterally:  Yes Pulse Check Posterior Tibialis and Dorsalis pulse intact bilaterally:  Yes Comments     Results for orders placed or performed in visit on 08/30/16  POCT HgB A1C  Result Value Ref Range   Hemoglobin A1C 7.8 (A) 5.7      Assessment & Plan:   Problem List Items Addressed This Visit    Essential hypertension    Mild elevated BP but improved on manual re-check - Home BP readings none No known complications   Plan:  1. Continue current BP regimen - Lisinopril-HCTZ 20-12.'5mg'$  daily 2. Encourage improved lifestyle - low sodium diet, needs to restart regular exercise 3. Start monitor BP outside office, bring readings to next visit, if persistently >140/90 or new symptoms notify office sooner 4. Follow-up 3 months      Relevant Medications   pravastatin (PRAVACHOL) 10 MG tablet   Hyperlipidemia associated with type 2 diabetes mellitus ([place])    Off Statin for few weeks ran out, despite having refills New rx sent Pravastatin '10mg'$  daily Due for repeat lipids, last 04/2015, orders placed      Relevant Medications   pravastatin (PRAVACHOL) 10 MG tablet   Uncontrolled type 2 diabetes mellitus with diabetic retinopathy ([place]) - Primary    Due for repeat A1c, previously improved control DM with A1c 7.8 (from prior 1.0>6.2) Complications - DM retinopathy - Limited due to cost off Trulicity x 2-3 months (donut hole)  Plan: 1. HOLD Trulicity temporarily still while in donut hole - Switch back to Glimepiride '2mg'$  daily -  may need to adjust dose - Continue Metformin '1000mg'$  BID - Handout given patient to check cost coverage of SGLT2 meds per AVS, notify office sooner if able to get covered, can switch instead of GLP1 2. Encourage improved lifestyle - low carb, low sugar diet, reduce portion size, start regular exercise 3. Check CBG, bring log to next visit for review 4. Continue ASA, ACEi, resumed Statin 5. Completed DM Foot / UTD on DM EYe 6. Follow-up 3 months - labs including A1c  Future considerations - not qualify for MAP, may be able to get  Trulicity covered early next year 2019 if out of donut hole, and may have to take it only 6 months out of the year. Otherwise, possibly SGLT2 may be better. Or can consider switch to local Endocrinology if have samples or other program      Relevant Medications   pravastatin (PRAVACHOL) 10 MG tablet      Meds ordered this encounter  Medications  . pravastatin (PRAVACHOL) 10 MG tablet    Sig: Take 1 tablet (10 mg total) daily by mouth.    Dispense:  90 tablet    Refill:  3   Follow up plan: Return in about 3 months (around 03/04/2017) for DM A1c.  Future fasting labs ordered for 1 week, since he did not schedule last lab apt. Then return in 3 months.  Nobie Putnam, [facility] 12/03/2016, 12:17 AM

## 2016-12-03 ENCOUNTER — Other Ambulatory Visit: Payer: Self-pay | Admitting: Family Medicine

## 2016-12-03 ENCOUNTER — Encounter: Payer: Self-pay | Admitting: Family Medicine

## 2016-12-03 DIAGNOSIS — I10 Essential (primary) hypertension: Secondary | ICD-10-CM

## 2016-12-03 DIAGNOSIS — E11319 Type 2 diabetes mellitus with unspecified diabetic retinopathy without macular edema: Secondary | ICD-10-CM

## 2016-12-03 DIAGNOSIS — E1169 Type 2 diabetes mellitus with other specified complication: Secondary | ICD-10-CM

## 2016-12-03 DIAGNOSIS — E1165 Type 2 diabetes mellitus with hyperglycemia: Principal | ICD-10-CM

## 2016-12-03 DIAGNOSIS — E785 Hyperlipidemia, unspecified: Secondary | ICD-10-CM

## 2016-12-03 DIAGNOSIS — IMO0002 Reserved for concepts with insufficient information to code with codable children: Secondary | ICD-10-CM

## 2016-12-03 NOTE — Assessment & Plan Note (Signed)
Due for repeat A1c, previously improved control DM with A1c 7.8 (from prior 8.3>8.1) Complications - DM retinopathy - Limited due to cost off Trulicity x 2-3 months (donut hole)  Plan: 1. HOLD Trulicity temporarily still while in donut hole - Switch back to Glimepiride 2mg  daily - may need to adjust dose - Continue Metformin 1000mg  BID - Handout given patient to check cost coverage of SGLT2 meds per AVS, notify office sooner if able to get covered, can switch instead of GLP1 2. Encourage improved lifestyle - low carb, low sugar diet, reduce portion size, start regular exercise 3. Check CBG, bring log to next visit for review 4. Continue ASA, ACEi, resumed Statin 5. Completed DM Foot / UTD on DM EYe 6. Follow-up 3 months - labs including A1c  Future considerations - not qualify for MAP, may be able to get Trulicity covered early next year 2019 if out of donut hole, and may have to take it only 6 months out of the year. Otherwise, possibly SGLT2 may be better. Or can consider switch to local Endocrinology if have samples or other program

## 2016-12-03 NOTE — Assessment & Plan Note (Signed)
Mild elevated BP but improved on manual re-check - Home BP readings none No known complications   Plan:  1. Continue current BP regimen - Lisinopril-HCTZ 20-12.5mg  daily 2. Encourage improved lifestyle - low sodium diet, needs to restart regular exercise 3. Start monitor BP outside office, bring readings to next visit, if persistently >140/90 or new symptoms notify office sooner 4. Follow-up 3 months

## 2016-12-03 NOTE — Assessment & Plan Note (Signed)
Off Statin for few weeks ran out, despite having refills New rx sent Pravastatin 10mg  daily Due for repeat lipids, last 04/2015, orders placed

## 2016-12-11 DIAGNOSIS — Z0000 Encounter for general adult medical examination without abnormal findings: Secondary | ICD-10-CM | POA: Diagnosis not present

## 2016-12-11 DIAGNOSIS — Z125 Encounter for screening for malignant neoplasm of prostate: Secondary | ICD-10-CM | POA: Diagnosis not present

## 2016-12-13 LAB — PSA, TOTAL WITH REFLEX TO PSA, FREE: PSA, TOTAL: 0.3 ng/mL (ref <=4.0)

## 2016-12-24 ENCOUNTER — Telehealth: Payer: Self-pay | Admitting: Family Medicine

## 2016-12-24 NOTE — Telephone Encounter (Signed)
Pt advised.

## 2016-12-24 NOTE — Telephone Encounter (Signed)
Last seen in 11/2016, patient was asked to check insurance coverage of SGLT2 medications, and he had previously checked coverage of GLP1 in past after no longer able to afford Trulicity due to medicare donut hole. He states now that all SGLT2 meds are >$300 cost jardiance, invokana, farxiga. Same with GLP1.  He is asking what else can he do.  I would recommend continue with Metformin and INCREASE Glimepiride from 2mg  daily before breakfast to 2 pills or 4mg  daily, at next visit we can adjust dose further if needed.  Lastly if still unable to get him any medication assistance, we may have to consider referral to Endocrinology Clinic to see if they have any med assistance, or study he can enroll in for covered medications.  Saralyn PilarAlexander Jaree Dwight, DO [facility] 12/24/2016, 11:57 AM

## 2017-01-01 ENCOUNTER — Other Ambulatory Visit: Payer: Self-pay

## 2017-01-01 ENCOUNTER — Telehealth: Payer: Self-pay | Admitting: Family Medicine

## 2017-01-01 DIAGNOSIS — E11319 Type 2 diabetes mellitus with unspecified diabetic retinopathy without macular edema: Secondary | ICD-10-CM

## 2017-01-01 DIAGNOSIS — IMO0002 Reserved for concepts with insufficient information to code with codable children: Secondary | ICD-10-CM

## 2017-01-01 DIAGNOSIS — E1165 Type 2 diabetes mellitus with hyperglycemia: Principal | ICD-10-CM

## 2017-01-01 MED ORDER — GLIMEPIRIDE 2 MG PO TABS: 4.0000 mg | ORAL_TABLET | Freq: Every day | ORAL | 3 refills | Status: DC

## 2017-01-01 NOTE — Telephone Encounter (Signed)
error 

## 2017-02-28 ENCOUNTER — Other Ambulatory Visit: Payer: Self-pay | Admitting: Family Medicine

## 2017-02-28 ENCOUNTER — Ambulatory Visit: Payer: Medicare HMO | Admitting: Family Medicine

## 2017-02-28 DIAGNOSIS — I10 Essential (primary) hypertension: Secondary | ICD-10-CM

## 2017-03-05 ENCOUNTER — Encounter: Payer: Self-pay | Admitting: Family Medicine

## 2017-03-05 ENCOUNTER — Ambulatory Visit (INDEPENDENT_AMBULATORY_CARE_PROVIDER_SITE_OTHER): Payer: Medicare HMO | Admitting: Family Medicine

## 2017-03-05 ENCOUNTER — Other Ambulatory Visit: Payer: Self-pay | Admitting: Family Medicine

## 2017-03-05 VITALS — BP 148/82 | HR 58 | Temp 98.6°F | Resp 16 | Ht 71.5 in | Wt 213.6 lb

## 2017-03-05 DIAGNOSIS — I10 Essential (primary) hypertension: Secondary | ICD-10-CM

## 2017-03-05 DIAGNOSIS — Z0000 Encounter for general adult medical examination without abnormal findings: Secondary | ICD-10-CM

## 2017-03-05 DIAGNOSIS — E785 Hyperlipidemia, unspecified: Secondary | ICD-10-CM | POA: Diagnosis not present

## 2017-03-05 DIAGNOSIS — E113293 Type 2 diabetes mellitus with mild nonproliferative diabetic retinopathy without macular edema, bilateral: Secondary | ICD-10-CM

## 2017-03-05 DIAGNOSIS — E1169 Type 2 diabetes mellitus with other specified complication: Secondary | ICD-10-CM

## 2017-03-05 DIAGNOSIS — Z125 Encounter for screening for malignant neoplasm of prostate: Secondary | ICD-10-CM

## 2017-03-05 DIAGNOSIS — N529 Male erectile dysfunction, unspecified: Secondary | ICD-10-CM

## 2017-03-05 LAB — POCT GLYCOSYLATED HEMOGLOBIN (HGB A1C): HEMOGLOBIN A1C: 7.4 — AB (ref ?–5.7)

## 2017-03-05 MED ORDER — LISINOPRIL-HYDROCHLOROTHIAZIDE 20-12.5 MG PO TABS: 1.0000 | ORAL_TABLET | Freq: Every day | ORAL | 3 refills | Status: DC

## 2017-03-05 MED ORDER — DULAGLUTIDE 0.75 MG/0.5ML ~~LOC~~ SOAJ: 0.7500 mg | SUBCUTANEOUS | 2 refills | Status: DC

## 2017-03-05 MED ORDER — GLIMEPIRIDE 4 MG PO TABS: 4.0000 mg | ORAL_TABLET | Freq: Every day | ORAL | 3 refills | Status: DC

## 2017-03-05 NOTE — Assessment & Plan Note (Signed)
Mild elevated BP but improved on manual re-check still elevated - off BP med today - Home BP readings none No known complications    Plan:  1. Continue current BP regimen - Lisinopril-HCTZ 20-12.5mg  daily - refilled 2. Encourage improved lifestyle - low sodium diet, needs to restart regular exercise - again reviewed importance of lifestyle 3. Again start monitor BP outside office, bring readings to next visit, if persistently >140/90 or new symptoms notify office sooner 4. Follow-up 3 months

## 2017-03-05 NOTE — Assessment & Plan Note (Signed)
Improved A1c control 7.4 from prior 7.8, improved lifestyle and adherence to meds Remains off Trulicity (d/t cost, coverage, donut hole) Complications - DM retinopathy - SGLT2 meds are same cost as GLP1  Plan: 1. Discussion on benefits of GLP1 medicine vs sulfonylurea - advised that since now new year 2019 he may have better coverage on Trulicity, agree to send rx again to see if he can afford w/ coverage, Trulicity 0.75mg  [place] weekly inj - reduce back to starting dose - advised if sugar runs stable, perhaps he can hold off on it but purchase it now while he can afford it and save if needed - otherwise at next visit if able to get med may discuss reducing Glimepiride again and resuming Trulicity, would help weight and Bp as well - Reorder Glimepiride 4mg  pills once daily (instead of x 2 of 2mg ) - Continue Metformin 1000mg  BID 2. Encourage improved lifestyle - low carb, low sugar diet, reduce portion size, start regular exercise 3. Check CBG, bring log to next visit for review 4. Continue ASA, ACEi, resumed Statin 5. Due for DM Eye Dr Clydene PughWoodard 05/2017 6. Follow-up 3 months - Annual + labs A1c

## 2017-03-05 NOTE — Patient Instructions (Addendum)
Thank you for coming to the office today.   1. A1c 7.4 improved from 7.8  CONTINUE same regimen - sent in Glimepiride 20m daily one pill 90 day supply instead of 2 of the 2 mg tabs  Continue Metformin 10010mBID  If you can get Trulicity covered I would pick this med up for future use  2.   Cologuard kit at home - call for help or questions, 24/7 telephone support at [phone] Ask if still good. Then see if you can send it in to complete screening  DUE for FASTING BLOOD WORK (no food or drink after midnight before the lab appointment, only water or coffee without cream/sugar on the morning of)  SCHEDULE "Lab Only" visit in the morning at the clinic for lab draw in 3 MONTHS   - Make sure Lab Only appointment is at about 1 week before your next appointment, so that results will be available  For Lab Results, once available within 2-3 days of blood draw, you can can log in to MyChart online to view your results and a brief explanation. Also, we can discuss results at next follow-up visit.   Please schedule a Follow-up Appointment to: Return in about 3 months (around 06/02/2017) for Annual Physical.    If you have any other questions or concerns, please feel free to call the office or send a message through MyResacaYou may also schedule an earlier appointment if necessary.  Additionally, you may be receiving a survey about your experience at our office within a few days to 1 week by e-mail or mail. We value your feedback.  AlNobie PutnamDO [facility]

## 2017-03-05 NOTE — Progress Notes (Signed)
Subjective:    Patient ID: [name], male    DOB: November 02, 1945, 72 y.o.   MRN: [id number]  [name] is a 72 y.o. male presenting on 03/05/2017 for Diabetes (highest 200 and lowest 98)   HPI   CHRONIC DM, Type 2: Last visit 12/02/16, prior A1c 7.8, interval update he was not able to afford Trulicity 1.5 when in donut hole end of last year due to medicare, and he stopped this, and resumed Glimepiride. Today reports doing well back on Glimepiride CBGs: Avg 140, Low 120, High < 175. Checks CBGs most days Meds: Metformin 1048m BID, Glimepiride 425mdaily (taking 49m59m 2 tabs) - Remains off Trulicity 1.56.2MB weekly inj Reports good compliance. Tolerating well w/o side-effects Currently on ACEi Lifestyle: - Diet (Trying to improve diet) - Exercise (Limited exercise due to time, has gym membership but still not regularly going) - Christopher WooEllin Mayhewst DM Eye exam 05/2016, history of DM retinopathy Denies hypoglycemia, weakness, tingling, numbness  CHRONIC HTN: Reports has some fluctuating BP, but not checking regularly. Ran out of BP med for 1-2 weeks, due to pick up new med refill Current Meds - Lisinopril-HCTZ 20-12.5mg26mily Reports good compliance, took meds today. Tolerating well, w/o complaints Denies CP, dyspnea, HA, edema, dizziness / lightheadedness  HYPERLIPIDEMIA: - Reports no concerns. Last lipid panel 2018,  - Currently taking Pravastatin 10mg51mlerating well without side effects or myalgias   Health Maintenance: Due colon cancer screening - he has cologuard kit at home, unopened, he was told insurance covers 100%, has not submitted yet.  Depression screen PHQ 2[facility]2/13/2019 12/02/2016 05/22/2016  Decreased Interest 0 0 0  Down, Depressed, Hopeless 0 0 0  PHQ - 2 Score 0 0 0  Altered sleeping - - 0  Tired, decreased energy - - 0  Change in appetite - - 0  Feeling bad or failure about yourself  - - 0  Trouble concentrating - - 0  Moving slowly or fidgety/restless - -  0  Suicidal thoughts - - 0  PHQ-9 Score - - 0  Difficult doing work/chores - - Not difficult at all    Social History   Tobacco Use  . Smoking status: Current Some Day Smoker  . Smokeless tobacco: Current User  Substance Use Topics  . Alcohol use: Yes    Alcohol/week: 0.6 oz    Types: 1 Cans of beer per week  . Drug use: No    Review of Systems Per HPI unless specifically indicated above     Objective:    BP (!) 148/82 (BP Location: Left Arm, Cuff Size: Normal)   Pulse (!) 58   Temp 98.6 F (37 C) (Oral)   Resp 16   Ht 5' 11.5" (1.816 m)   Wt 213 lb 9.6 oz (96.9 kg)   BMI 29.38 kg/m   Wt Readings from Last 3 Encounters:  03/05/17 213 lb 9.6 oz (96.9 kg)  12/02/16 205 lb (93 kg)  08/30/16 194 lb (88 kg)    Physical Exam  Constitutional: He is oriented to person, place, and time. He appears well-developed and well-nourished. No distress.  Well-appearing, comfortable, cooperative  HENT:  Head: Normocephalic and atraumatic.  Mouth/Throat: Oropharynx is clear and moist.  Eyes: Conjunctivae are normal. Right eye exhibits no discharge. Left eye exhibits no discharge.  Neck: Normal range of motion. Neck supple. No thyromegaly present.  Cardiovascular: Normal rate, regular rhythm, normal heart sounds and intact distal pulses.  No murmur heard. Pulmonary/Chest:  Effort normal and breath sounds normal. No respiratory distress. He has no wheezes. He has no rales.  Musculoskeletal: Normal range of motion. He exhibits no edema.  Lymphadenopathy:    He has no cervical adenopathy.  Neurological: He is alert and oriented to person, place, and time.  Skin: Skin is warm and dry. No rash noted. He is not diaphoretic. No erythema.  Psychiatric: He has a normal mood and affect. His behavior is normal.  Well groomed, good eye contact, normal speech and thoughts  Nursing note and vitals reviewed.  Results for orders placed or performed in visit on 03/05/17  POCT HgB A1C  Result  Value Ref Range   Hemoglobin A1C 7.4 (A) 5.7   Recent Labs    08/30/16 1434 03/05/17 0818  HGBA1C 7.8* 7.4*       Assessment & Plan:   Problem List Items Addressed This Visit    Controlled type 2 diabetes mellitus with retinopathy ([place]) - Primary    Improved A1c control 7.4 from prior 7.8, improved lifestyle and adherence to meds Remains off Trulicity (d/t cost, coverage, donut hole) Complications - DM retinopathy - SGLT2 meds are same cost as GLP1  Plan: 1. Discussion on benefits of GLP1 medicine vs sulfonylurea - advised that since now new year 2019 he may have better coverage on Trulicity, agree to send rx again to see if he can afford w/ coverage, Trulicity 0.35KK [place] weekly inj - reduce back to starting dose - advised if sugar runs stable, perhaps he can hold off on it but purchase it now while he can afford it and save if needed - otherwise at next visit if able to get med may discuss reducing Glimepiride again and resuming Trulicity, would help weight and Bp as well - Reorder Glimepiride 1m pills once daily (instead of x 2 of 212m - Continue Metformin 100039mID 2. Encourage improved lifestyle - low carb, low sugar diet, reduce portion size, start regular exercise 3. Check CBG, bring log to next visit for review 4. Continue ASA, ACEi, resumed Statin 5. Due for DM Eye Christopher WooEllin Mayhew2019 6. Follow-up 3 months - Annual + labs A1c      Relevant Medications   Dulaglutide (TRULICITY) 0.79.38/HW/2.9HBPN   lisinopril-hydrochlorothiazide (PRINZIDE,ZESTORETIC) 20-12.5 MG tablet   glimepiride (AMARYL) 4 MG tablet   Other Relevant Orders   POCT HgB A1C (Completed)   Essential hypertension    Mild elevated BP but improved on manual re-check still elevated - off BP med today - Home BP readings none No known complications    Plan:  1. Continue current BP regimen - Lisinopril-HCTZ 20-12.5mg28mily - refilled 2. Encourage improved lifestyle - low sodium diet, needs to restart regular  exercise - again reviewed importance of lifestyle 3. Again start monitor BP outside office, bring readings to next visit, if persistently >140/90 or new symptoms notify office sooner 4. Follow-up 3 months      Relevant Medications   lisinopril-hydrochlorothiazide (PRINZIDE,ZESTORETIC) 20-12.5 MG tablet   Hyperlipidemia associated with type 2 diabetes mellitus (HCC)    Stable Back on Pravastatin 10mg53mly Re-check lipids in 3 months with annual      Relevant Medications   Dulaglutide (TRULICITY) 0.75 7.16.RC/7.8LF   lisinopril-hydrochlorothiazide (PRINZIDE,ZESTORETIC) 20-12.5 MG tablet   glimepiride (AMARYL) 4 MG tablet      Meds ordered this encounter  Medications  . Dulaglutide (TRULICITY) 0.75 8.10.FB/5.1WC    Sig: Inject 0.75 mg into the skin once a week.  Dispense:  4 pen    Refill:  2  . lisinopril-hydrochlorothiazide (PRINZIDE,ZESTORETIC) 20-12.5 MG tablet    Sig: Take 1 tablet by mouth daily.    Dispense:  90 tablet    Refill:  3    Keep refills on file  . glimepiride (AMARYL) 4 MG tablet    Sig: Take 1 tablet (4 mg total) by mouth daily before breakfast.    Dispense:  90 tablet    Refill:  3      Follow up plan: Return in about 3 months (around 06/02/2017) for Annual Physical.  Future labs ordered for 05/29/17  Recently completed FMLA paperwork, he has picked this up today.  Nobie Putnam, [facility] 03/05/2017, 1:01 PM

## 2017-03-05 NOTE — Assessment & Plan Note (Signed)
Stable Back on Pravastatin 10mg  daily Re-check lipids in 3 months with annual

## 2017-04-09 DIAGNOSIS — H5203 Hypermetropia, bilateral: Secondary | ICD-10-CM | POA: Diagnosis not present

## 2017-04-09 DIAGNOSIS — H524 Presbyopia: Secondary | ICD-10-CM | POA: Diagnosis not present

## 2017-04-09 DIAGNOSIS — Z0100 Encounter for examination of eyes and vision without abnormal findings: Secondary | ICD-10-CM | POA: Diagnosis not present

## 2017-04-15 ENCOUNTER — Encounter: Payer: Self-pay | Admitting: Family Medicine

## 2017-04-15 ENCOUNTER — Telehealth: Payer: Self-pay | Admitting: Family Medicine

## 2017-04-15 NOTE — Telephone Encounter (Signed)
Letter written as requested, if needs to be changed let me know. He may pick it up whenever starting 04/16/17. Or can be mailed, please notify patient.  Saralyn PilarAlexander Dravon Nott, DO [facility] 04/15/2017, 5:14 PM

## 2017-04-15 NOTE — Telephone Encounter (Signed)
Pt. Called wanting to know if you would write a letter stating those shoes( I emailed )you, states they are comfortable on his feet for work. Pt. Call back # is [phone]

## 2017-05-15 ENCOUNTER — Other Ambulatory Visit: Payer: Self-pay | Admitting: Family Medicine

## 2017-05-15 DIAGNOSIS — E1165 Type 2 diabetes mellitus with hyperglycemia: Principal | ICD-10-CM

## 2017-05-15 DIAGNOSIS — IMO0001 Reserved for inherently not codable concepts without codable children: Secondary | ICD-10-CM

## 2017-05-15 MED ORDER — METFORMIN HCL 1000 MG PO TABS: 1000.0000 mg | ORAL_TABLET | Freq: Two times a day (BID) | ORAL | 3 refills | Status: DC

## 2017-05-15 NOTE — Telephone Encounter (Signed)
Pt. Called requesting a refill on metformin  [facility]ed into [facility]  [place]

## 2017-05-16 ENCOUNTER — Telehealth: Payer: Self-pay | Admitting: Family Medicine

## 2017-05-16 MED ORDER — SELENIUM SULFIDE 2.5 % EX LOTN: TOPICAL_LOTION | Freq: Every day | CUTANEOUS | 11 refills | Status: DC | PRN

## 2017-05-16 NOTE — Telephone Encounter (Signed)
Pt. Called requesting a refill on  Selenium 2.[facility]  [place]

## 2017-05-29 ENCOUNTER — Other Ambulatory Visit: Payer: Medicare HMO

## 2017-05-29 DIAGNOSIS — Z125 Encounter for screening for malignant neoplasm of prostate: Secondary | ICD-10-CM

## 2017-05-29 DIAGNOSIS — N529 Male erectile dysfunction, unspecified: Secondary | ICD-10-CM

## 2017-05-29 DIAGNOSIS — E1169 Type 2 diabetes mellitus with other specified complication: Secondary | ICD-10-CM | POA: Diagnosis not present

## 2017-05-29 DIAGNOSIS — I10 Essential (primary) hypertension: Secondary | ICD-10-CM | POA: Diagnosis not present

## 2017-05-29 DIAGNOSIS — Z0000 Encounter for general adult medical examination without abnormal findings: Secondary | ICD-10-CM | POA: Diagnosis not present

## 2017-05-29 DIAGNOSIS — E113293 Type 2 diabetes mellitus with mild nonproliferative diabetic retinopathy without macular edema, bilateral: Secondary | ICD-10-CM | POA: Diagnosis not present

## 2017-05-29 DIAGNOSIS — E785 Hyperlipidemia, unspecified: Secondary | ICD-10-CM

## 2017-05-30 LAB — COMPLETE METABOLIC PANEL WITH GFR
AG RATIO: 1.4 (calc) (ref 1.0–2.5)
ALBUMIN MSPROF: 4.2 g/dL (ref 3.6–5.1)
ALT: 22 U/L (ref 9–46)
AST: 22 U/L (ref 10–35)
Alkaline phosphatase (APISO): 60 U/L (ref 40–115)
BUN: 14 mg/dL (ref 7–25)
CALCIUM: 9.4 mg/dL (ref 8.6–10.3)
CHLORIDE: 101 mmol/L (ref 98–110)
CO2: 26 mmol/L (ref 20–32)
CREATININE: 1.17 mg/dL (ref 0.70–1.18)
GFR, Est African American: 72 mL/min/{1.73_m2} (ref >=60)
GFR, Est Non African American: 62 mL/min/{1.73_m2} (ref >=60)
GLOBULIN: 3.1 g/dL (ref 1.9–3.7)
Glucose, Bld: 123 mg/dL — ABNORMAL HIGH (ref 65–99)
Potassium: 4.2 mmol/L (ref 3.5–5.3)
Sodium: 136 mmol/L (ref 135–146)
TOTAL PROTEIN: 7.3 g/dL (ref 6.1–8.1)
Total Bilirubin: 0.6 mg/dL (ref 0.2–1.2)

## 2017-05-30 LAB — PSA, TOTAL WITH REFLEX TO PSA, FREE: PSA, Total: 0.2 ng/mL (ref <=4.0)

## 2017-05-30 LAB — LIPID PANEL
CHOL/HDL RATIO: 2.6 (calc) (ref <5.0)
CHOLESTEROL: 184 mg/dL (ref <200)
HDL: 71 mg/dL (ref >40)
LDL Cholesterol (Calc): 92 mg/dL (calc)
Non-HDL Cholesterol (Calc): 113 mg/dL (calc) (ref <130)
Triglycerides: 112 mg/dL (ref <150)

## 2017-05-30 LAB — CBC WITH DIFFERENTIAL/PLATELET
BASOS ABS: 42 {cells}/uL (ref 0–200)
Basophils Relative: 1 %
EOS ABS: 59 {cells}/uL (ref 15–500)
EOS PCT: 1.4 %
HCT: 44.6 % (ref 38.5–50.0)
HEMOGLOBIN: 15.2 g/dL (ref 13.2–17.1)
Lymphs Abs: 1945 cells/uL (ref 850–3900)
MCH: 30.6 pg (ref 27.0–33.0)
MCHC: 34.1 g/dL (ref 32.0–36.0)
MCV: 89.7 fL (ref 80.0–100.0)
MPV: 9.3 fL (ref 7.5–12.5)
Monocytes Relative: 10.5 %
NEUTROS ABS: 1714 {cells}/uL (ref 1500–7800)
Neutrophils Relative %: 40.8 %
Platelets: 197 10*3/uL (ref 140–400)
RBC: 4.97 10*6/uL (ref 4.20–5.80)
RDW: 12.2 % (ref 11.0–15.0)
TOTAL LYMPHOCYTE: 46.3 %
WBC mixed population: 441 cells/uL (ref 200–950)
WBC: 4.2 10*3/uL (ref 3.8–10.8)

## 2017-05-30 LAB — HEMOGLOBIN A1C
HEMOGLOBIN A1C: 6.7 %{Hb} — AB (ref <5.7)
Mean Plasma Glucose: 146 (calc)
eAG (mmol/L): 8.1 (calc)

## 2017-06-04 ENCOUNTER — Encounter: Payer: Self-pay | Admitting: Family Medicine

## 2017-06-04 ENCOUNTER — Ambulatory Visit (INDEPENDENT_AMBULATORY_CARE_PROVIDER_SITE_OTHER): Payer: Medicare HMO | Admitting: Family Medicine

## 2017-06-04 VITALS — BP 130/78 | HR 58 | Temp 98.4°F | Resp 16 | Ht 71.5 in | Wt 207.0 lb

## 2017-06-04 DIAGNOSIS — N529 Male erectile dysfunction, unspecified: Secondary | ICD-10-CM

## 2017-06-04 DIAGNOSIS — I10 Essential (primary) hypertension: Secondary | ICD-10-CM | POA: Diagnosis not present

## 2017-06-04 DIAGNOSIS — E785 Hyperlipidemia, unspecified: Secondary | ICD-10-CM

## 2017-06-04 DIAGNOSIS — E1169 Type 2 diabetes mellitus with other specified complication: Secondary | ICD-10-CM

## 2017-06-04 DIAGNOSIS — E113293 Type 2 diabetes mellitus with mild nonproliferative diabetic retinopathy without macular edema, bilateral: Secondary | ICD-10-CM | POA: Diagnosis not present

## 2017-06-04 MED ORDER — DULAGLUTIDE 1.5 MG/0.5ML ~~LOC~~ SOAJ: 1.5000 mg | SUBCUTANEOUS | 5 refills | Status: DC

## 2017-06-04 NOTE — Progress Notes (Signed)
Subjective:    Patient ID: [name], male    DOB: March 18, 1945, 72 y.o.   MRN: [id number]  [name] is a 72 y.o. male presenting on 06/04/2017 for Diabetes and Hypertension   HPI   CHRONIC DM, Type 2 w/ Retinopathy Last visit 02/2017, he was restarted back on Trulicity 1.43 after he was off for few months due to medicare donut hole unable to afford. He had elevated A1c, now it is improved again back on injection medicine Today reports doing well back on both meds CBGs: Avg 120-140, Low 89 (asymptomatic), High< 175. Checks CBGsmost days Meds:Trulicity 8.88LN weekly injection, Metformin 1049m BID, Glimepiride 45mdaily Reports good compliance. Tolerating well w/o side-effects Currently on ACEi Lifestyle: - Diet (Trying to improve diet - not always adhering) - Exercise (Limited exercise due to time, plans to resume some gym exercise) - Dr WoEllin Mayhewast DM Eye exam 05/2016, history of DM retinopathy - he has not scheduled apt for 05/2017 yet Denies hypoglycemia  Erectile Dysfunction History of ED, he has tried medicines before some mixed result, he has recently ordered Sildenafil 10074mhas not tried it yet. He is asking about injections for erectile function and how these work, has not seen [facility].  CHRONIC HTN: Reports no new concern, he checks BP occasionally usually it is better, it is higher in office. Current Meds -Lisinopril-HCTZ 20-12.5mg24mily Reports good compliance, took meds today. Tolerating well, w/o complaints  HYPERLIPIDEMIA: - Reports no concerns. Last lipid panel 05/2017 - normal results - Currently taking Pravastatin 10mg91mlerating well without side effects or myalgias   Health Maintenance: Due colon cancer screening - he has cologuard kit at home, unopened, he was told insurance covers 100%, has not submitted yet - he is still waiting on this one and has not completed it yet, he will again try to call to make sure kit is still good and send  it in as requested  Prostate CA Screening: Last PSA 0.2 (05/2017). Currently asymptomatic. No known family history of prostate CA.   Depression screen PHQ 2[facility]/15/2019 03/05/2017 12/02/2016  Decreased Interest 0 0 0  Down, Depressed, Hopeless 0 0 0  PHQ - 2 Score 0 0 0  Altered sleeping - - -  Tired, decreased energy - - -  Change in appetite - - -  Feeling bad or failure about yourself  - - -  Trouble concentrating - - -  Moving slowly or fidgety/restless - - -  Suicidal thoughts - - -  PHQ-9 Score - - -  Difficult doing work/chores - - -    Past Medical History:  Diagnosis Date  . Erectile dysfunction   . Hypertension   . Right-sided chest pain    Past Surgical History:  Procedure Laterality Date  . ROTATOR CUFF REPAIR     Social History   Socioeconomic History  . Marital status: Single    Spouse name: Not on file  . Number of children: Not on file  . Years of education: Not on file  . Highest education level: Not on file  Occupational History  . Not on file  Social Needs  . Financial resource strain: Not on file  . Food insecurity:    Worry: Not on file    Inability: Not on file  . Transportation needs:    Medical: Not on file    Non-medical: Not on file  Tobacco Use  . Smoking status: Current Some Day Smoker  . Smokeless tobacco: Current  User  Substance and Sexual Activity  . Alcohol use: Yes    Alcohol/week: 0.6 oz    Types: 1 Cans of beer per week  . Drug use: No  . Sexual activity: Not on file  Lifestyle  . Physical activity:    Days per week: Not on file    Minutes per session: Not on file  . Stress: Not on file  Relationships  . Social connections:    Talks on phone: Not on file    Gets together: Not on file    Attends religious service: Not on file    Active member of club or organization: Not on file    Attends meetings of clubs or organizations: Not on file    Relationship status: Not on file  . Intimate partner violence:    Fear of  current or ex partner: Not on file    Emotionally abused: Not on file    Physically abused: Not on file    Forced sexual activity: Not on file  Other Topics Concern  . Not on file  Social History Narrative  . Not on file   Family History  Problem Relation Age of Onset  . Cancer Brother        Throat   Current Outpatient Medications on File Prior to Visit  Medication Sig  . glucose blood (ONE TOUCH ULTRA TEST) test strip Check blood sugar twice daily. Dx:E11.9  . lisinopril-hydrochlorothiazide (PRINZIDE,ZESTORETIC) 20-12.5 MG tablet Take 1 tablet by mouth daily.  . metFORMIN (GLUCOPHAGE) 1000 MG tablet Take 1 tablet (1,000 mg total) by mouth 2 (two) times daily with a meal.  . naproxen (NAPROSYN) 500 MG tablet Take 1 tablet (500 mg total) by mouth 2 (two) times daily with a meal. For 2-4 weeks then as needed  . ONETOUCH DELICA LANCETS 76E MISC 1 each by Does not apply route 3 (three) times daily.  . pravastatin (PRAVACHOL) 10 MG tablet Take 1 tablet (10 mg total) daily by mouth.  . selenium sulfide (SELSUN) 2.5 % shampoo Apply topically daily as needed for irritation.  . sildenafil (VIAGRA) 100 MG tablet Take 100 mg by mouth daily as needed.   No current facility-administered medications on file prior to visit.     Review of Systems  Constitutional: Negative for activity change, appetite change, chills, diaphoresis, fatigue and fever.  HENT: Negative for congestion and hearing loss.   Eyes: Negative for visual disturbance.  Respiratory: Negative for apnea, cough, choking, chest tightness, shortness of breath and wheezing.   Cardiovascular: Negative for chest pain, palpitations and leg swelling.  Gastrointestinal: Negative for abdominal pain, anal bleeding, blood in stool, constipation, diarrhea, nausea and vomiting.  Endocrine: Negative for cold intolerance and polyuria.  Genitourinary: Negative for decreased urine volume, difficulty urinating, dysuria, frequency, hematuria,  testicular pain and urgency.       Erectile dysfunction  Musculoskeletal: Negative for arthralgias, back pain and neck pain.  Skin: Negative for rash.  Allergic/Immunologic: Negative for environmental allergies.  Neurological: Negative for dizziness, weakness, light-headedness, numbness and headaches.  Hematological: Negative for adenopathy.  Psychiatric/Behavioral: Negative for behavioral problems, dysphoric mood and sleep disturbance. The patient is not nervous/anxious.    Per HPI unless specifically indicated above     Objective:    BP 130/78 (BP Location: Left Arm, Cuff Size: Normal)   Pulse (!) 58   Temp 98.4 F (36.9 C) (Oral)   Resp 16   Ht 5' 11.5" (1.816 m)   Wt 207 lb (93.9  kg)   BMI 28.47 kg/m   Wt Readings from Last 3 Encounters:  06/04/17 207 lb (93.9 kg)  03/05/17 213 lb 9.6 oz (96.9 kg)  12/02/16 205 lb (93 kg)    Physical Exam  Constitutional: He is oriented to person, place, and time. He appears well-developed and well-nourished. No distress.  Well-appearing, comfortable, cooperative  HENT:  Head: Normocephalic and atraumatic.  Mouth/Throat: Oropharynx is clear and moist.  Eyes: Pupils are equal, round, and reactive to light. Conjunctivae and EOM are normal. Right eye exhibits no discharge. Left eye exhibits no discharge.  Neck: Normal range of motion. Neck supple. No thyromegaly present.  Cardiovascular: Normal rate, regular rhythm, normal heart sounds and intact distal pulses.  No murmur heard. Pulmonary/Chest: Effort normal and breath sounds normal. No respiratory distress. He has no wheezes. He has no rales.  Abdominal: Soft. Bowel sounds are normal. He exhibits no distension and no mass. There is no tenderness.  Musculoskeletal: Normal range of motion. He exhibits no edema or tenderness.  Upper / Lower Extremities: - Normal muscle tone, strength bilateral upper extremities 5/5, lower extremities 5/5  Lymphadenopathy:    He has no cervical adenopathy.    Neurological: He is alert and oriented to person, place, and time.  Distal sensation intact to light touch all extremities  Skin: Skin is warm and dry. No rash noted. He is not diaphoretic. No erythema.  Psychiatric: He has a normal mood and affect. His behavior is normal.  Well groomed, good eye contact, normal speech and thoughts  Nursing note and vitals reviewed.    Recent Labs    08/30/16 1434 03/05/17 0818 05/29/17 0812  HGBA1C 7.8* 7.4* 6.7*    Results for orders placed or performed in visit on 05/29/17  PSA, Total with Reflex to PSA, Free  Result Value Ref Range   PSA, Total 0.2 < OR = 4.0 ng/mL  CBC with Differential/Platelet  Result Value Ref Range   WBC 4.2 3.8 - 10.8 Thousand/uL   RBC 4.97 4.20 - 5.80 Million/uL   Hemoglobin 15.2 13.2 - 17.1 g/dL   HCT 44.6 38.5 - 50.0 %   MCV 89.7 80.0 - 100.0 fL   MCH 30.6 27.0 - 33.0 pg   MCHC 34.1 32.0 - 36.0 g/dL   RDW 12.2 11.0 - 15.0 %   Platelets 197 140 - 400 Thousand/uL   MPV 9.3 7.5 - 12.5 fL   Neutro Abs 1,714 1,500 - 7,800 cells/uL   Lymphs Abs 1,945 850 - 3,900 cells/uL   WBC mixed population 441 200 - 950 cells/uL   Eosinophils Absolute 59 15 - 500 cells/uL   Basophils Absolute 42 0 - 200 cells/uL   Neutrophils Relative % 40.8 %   Total Lymphocyte 46.3 %   Monocytes Relative 10.5 %   Eosinophils Relative 1.4 %   Basophils Relative 1.0 %  Hemoglobin A1c  Result Value Ref Range   Hgb A1c MFr Bld 6.7 (H) <5.7 % of total Hgb   Mean Plasma Glucose 146 (calc)   eAG (mmol/L) 8.1 (calc)  Lipid panel  Result Value Ref Range   Cholesterol 184 <200 mg/dL   HDL 71 >40 mg/dL   Triglycerides 112 <150 mg/dL   LDL Cholesterol (Calc) 92 mg/dL (calc)   Total CHOL/HDL Ratio 2.6 <5.0 (calc)   Non-HDL Cholesterol (Calc) 113 <130 mg/dL (calc)  COMPLETE METABOLIC PANEL WITH GFR  Result Value Ref Range   Glucose, Bld 123 (H) 65 - 99 mg/dL   BUN  14 7 - 25 mg/dL   Creat 1.17 0.70 - 1.18 mg/dL   GFR, Est Non African  American 62 > OR = 60 mL/min/1.875m   GFR, Est African American 72 > OR = 60 mL/min/1.765m  BUN/Creatinine Ratio NOT APPLICABLE 6 - 22 (calc)   Sodium 136 135 - 146 mmol/L   Potassium 4.2 3.5 - 5.3 mmol/L   Chloride 101 98 - 110 mmol/L   CO2 26 20 - 32 mmol/L   Calcium 9.4 8.6 - 10.3 mg/dL   Total Protein 7.3 6.1 - 8.1 g/dL   Albumin 4.2 3.6 - 5.1 g/dL   Globulin 3.1 1.9 - 3.7 g/dL (calc)   AG Ratio 1.4 1.0 - 2.5 (calc)   Total Bilirubin 0.6 0.2 - 1.2 mg/dL   Alkaline phosphatase (APISO) 60 40 - 115 U/L   AST 22 10 - 35 U/L   ALT 22 9 - 46 U/L      Assessment & Plan:   Problem List Items Addressed This Visit    Controlled type 2 diabetes [place]    Improved A1c control again down to 6.7 from prior 7.4 - 7.8 - remains back on medicine including Trulicity History of temporary offTrulicity (d/t cost, coverage, donut hole - winter time) Complications - DM retinopathy   Plan: 1. INCREASE dose Trulicity from 0.0.76o 1.2.2QJeekly [place] injection - new rx sent, finish last dose of 0.75 then next week start higher dose - In future if in medicare donut hole - we may be able to get him medication samples of Trulicity temporary - When start higher dose Trulicity - may HOLD or STOP Glimepiride 75m32maily - due to hypoglycemia risk and may not be needed - Continue Metformin 1000m41mD 2. Encourage improved lifestyle - low carb, low sugar diet, reduce portion size, start regular exercise 3. Check CBG, bring log to next visit for review 4. Continue ASA, ACEi, resumed Statin 5. Due for DM Eye Dr WoodEllin Mayhew019 6. Follow-up 4 months DM A1c - med adjust      Relevant Medications   Dulaglutide (TRULICITY) 1.5 MG/0FH/5.4TGN   Diabetic retinopathy (HCC)    Improved A1c now again down to 6.7, on current regimen Followed by Dr WoodEllin Mayhew retinopathy monitoring He is overdue for next DM eye exam this month 05/2017 - request to schedule and fax report      Relevant  Medications   Dulaglutide (TRULICITY) 1.5 MG/0YB/6.3SLN   ED (erectile dysfunction) of organic origin    Stable ED, seems dependent on generic Viagra 100mg68mil order, no active rx) Offered other generic PDE5 inhibitor options, due to cost he will continue with mail order Offer urology referral, declined at this time - discussed injection as option, advised I do not rx it but if viagra doesn't work we can refer him      Essential hypertension    Mild elevated BP but improved on manual re-check improved at goal - Home BP readings infrequent but improved No known complications    Plan:  1. Continue current BP regimen - Lisinopril-HCTZ 20-12.5mg d66my 2. Encourage improved lifestyle - low sodium diet, needs to restart regular exercise - again reviewed importance of lifestyle 3. Continue monitor BP outside office, bring readings to next visit, if persistently >140/90 or new symptoms notify office sooner 4. Follow-up 4 months      Relevant Medications   sildenafil (VIAGRA) 100 MG tablet   Hyperlipidemia associated with type  2 diabetes mellitus ([place])    Controlled cholesterol on statin and lifestyle Last lipid panel 05/2017 ASCVD risk score is elevated as Diabetic, and age 53 male  Plan: 1. Continue current meds - Pravastatin 40m daily 2. Future may reconsider ASA 863mfor primary ASCVD risk reduction 3. Encourage improved lifestyle - low carb/cholesterol, reduce portion size, continue improving regular exercise 4. Follow-up yearly lipid      Relevant Medications   sildenafil (VIAGRA) 100 MG tablet   Dulaglutide (TRULICITY) 1.5 MGFR/4.3QWOPN      Meds ordered this encounter  Medications  . Dulaglutide (TRULICITY) 1.5 MGQV/7.9KCOPN    Sig: Inject 1.5 mg into the skin once a week.    Dispense:  4 pen    Refill:  5    Dose increase from 0.75 to 1.5    Follow up plan: Return in about 4 months (around 10/05/2017) for Diabetes A1c, med adjust.  AlNobie PutnamDO [facility] 06/04/2017, 11:19 AM

## 2017-06-04 NOTE — Assessment & Plan Note (Signed)
Improved A1c now again down to 6.7, on current regimen Followed by Dr Clydene Pugh for retinopathy monitoring He is overdue for next DM eye exam this month 05/2017 - request to schedule and fax report

## 2017-06-04 NOTE — Assessment & Plan Note (Signed)
Controlled cholesterol on statin and lifestyle Last lipid panel 05/2017 ASCVD risk score is elevated as Diabetic, and age 72 male  Plan: 1. Continue current meds - Pravastatin  daily 2. Future may reconsider ASA  for primary ASCVD risk reduction 3. Encourage improved lifestyle - low carb/cholesterol, reduce portion size, continue improving regular exercise 4. Follow-up yearly lipid

## 2017-06-04 NOTE — Assessment & Plan Note (Signed)
Stable ED, seems dependent on generic Viagra  (mail order, no active rx) Offered other generic PDE5 inhibitor options, due to cost he will continue with mail order Offer urology referral, declined at this time - discussed injection as option, advised I do not rx it but if viagra doesn't work we can refer him

## 2017-06-04 NOTE — Assessment & Plan Note (Signed)
Improved A1c control again down to 6.7 from prior 7.4 - 7.8 - remains back on medicine including Trulicity History of temporary offTrulicity (d/t cost, coverage, donut hole - winter time) Complications - DM retinopathy   Plan: 1. INCREASE dose Trulicity from 0.75 to 1.5mg  weekly [place] injection - new rx sent, finish last dose of 0.75 then next week start higher dose - In future if in medicare donut hole - we may be able to get him medication samples of Trulicity temporary - When start higher dose Trulicity - may HOLD or STOP Glimepiride  daily - due to hypoglycemia risk and may not be needed - Continue Metformin  BID 2. Encourage improved lifestyle - low carb, low sugar diet, reduce portion size, start regular exercise 3. Check CBG, bring log to next visit for review 4. Continue ASA, ACEi, resumed Statin 5. Due for DM Eye Dr Clydene Pugh 05/2017 6. Follow-up 4 months DM A1c - med adjust

## 2017-06-04 NOTE — Patient Instructions (Addendum)
Thank you for coming to the office today.  For Diabetes A1c is 6.7 - down from previous 7.4 to 7.8  FINISH current Trulicity 0.75 (low dose) weekly - then sent new rx HIGHER DOSE Trulicity 1.5 weekly - start this and when you start the new injection you may STOP GLIMEPIRIDE  pill daily  Hopefully we will get samples of the injection later this year, if you are on donut hole and cannot afford  In future if sugars are consistently too elevated >150 fasting in morning, we may be able to restart the Glimepiride at lower dose  Try the Viagra medication that you have ordered, if not improving or need to consider injections for this, call me and we can refer you to Urologist  ------------- You are due for annual diabetic eye exam. Please have them send [place] a copy of your report by fax  [facility]   Address: [address], [place] [address] Phone: [phone]  Website: [email or web address]  COLOGUARD Check to see if it is still good to use and send back to them Follow instructions to collect sample, you may call the company for any help or questions, 24/7 telephone support at [phone].   Please schedule a Follow-up Appointment to: Return in about 4 months (around 10/05/2017) for Diabetes A1c, med adjust.  If you have any other questions or concerns, please feel free to call the office or send a message through MyChart. You may also schedule an earlier appointment if necessary.  Additionally, you may be receiving a survey about your experience at our office within a few days to 1 week by e-mail or mail. We value your feedback.  Saralyn Pilar, DO [facility], [place]

## 2017-06-04 NOTE — Assessment & Plan Note (Signed)
Mild elevated BP but improved on manual re-check improved at goal - Home BP readings infrequent but improved No known complications    Plan:  1. Continue current BP regimen - Lisinopril-HCTZ 20-12.5mg  daily 2. Encourage improved lifestyle - low sodium diet, needs to restart regular exercise - again reviewed importance of lifestyle 3. Continue monitor BP outside office, bring readings to next visit, if persistently >140/90 or new symptoms notify office sooner 4. Follow-up 4 months

## 2017-06-07 ENCOUNTER — Other Ambulatory Visit: Payer: Self-pay | Admitting: Family Medicine

## 2017-06-07 DIAGNOSIS — IMO0002 Reserved for concepts with insufficient information to code with codable children: Secondary | ICD-10-CM

## 2017-06-07 DIAGNOSIS — E1165 Type 2 diabetes mellitus with hyperglycemia: Secondary | ICD-10-CM

## 2017-06-09 ENCOUNTER — Other Ambulatory Visit: Payer: Self-pay | Admitting: Family Medicine

## 2017-06-09 DIAGNOSIS — IMO0002 Reserved for concepts with insufficient information to code with codable children: Secondary | ICD-10-CM

## 2017-06-09 DIAGNOSIS — E1165 Type 2 diabetes mellitus with hyperglycemia: Secondary | ICD-10-CM

## 2017-06-09 MED ORDER — ONETOUCH DELICA LANCETS 33G MISC: 1.0000 | Freq: Three times a day (TID) | 11 refills | Status: DC

## 2017-06-19 DIAGNOSIS — E113293 Type 2 diabetes mellitus with mild nonproliferative diabetic retinopathy without macular edema, bilateral: Secondary | ICD-10-CM | POA: Diagnosis not present

## 2017-06-19 DIAGNOSIS — H02883 Meibomian gland dysfunction of right eye, unspecified eyelid: Secondary | ICD-10-CM | POA: Diagnosis not present

## 2017-06-19 DIAGNOSIS — H02886 Meibomian gland dysfunction of left eye, unspecified eyelid: Secondary | ICD-10-CM | POA: Diagnosis not present

## 2017-06-19 DIAGNOSIS — H2513 Age-related nuclear cataract, bilateral: Secondary | ICD-10-CM | POA: Diagnosis not present

## 2017-06-19 LAB — HM DIABETES EYE EXAM

## 2017-06-27 ENCOUNTER — Encounter: Payer: Self-pay | Admitting: Family Medicine

## 2017-07-29 ENCOUNTER — Telehealth: Payer: Self-pay | Admitting: Family Medicine

## 2017-07-29 DIAGNOSIS — N529 Male erectile dysfunction, unspecified: Secondary | ICD-10-CM

## 2017-07-29 NOTE — Telephone Encounter (Signed)
Pt.called  requesting that you call him. He have question about a referral. He would not discuss with me. Pt call back  # is  [phone]

## 2017-07-29 NOTE — Telephone Encounter (Signed)
Last visit 05/2017 we discussed Erectile Dysfunction. He has been on Sildenafil in past. Now he is interested in other options and to discuss further with Urologist. Requesting referral to [facility].  Placed referral today.  Saralyn PilarAlexander [name], DO [facility] 07/29/2017, 12:11 PM

## 2017-08-05 ENCOUNTER — Ambulatory Visit (INDEPENDENT_AMBULATORY_CARE_PROVIDER_SITE_OTHER): Payer: Medicare HMO

## 2017-08-05 VITALS — BP 142/78 | HR 62 | Temp 98.4°F | Resp 16 | Ht 72.0 in | Wt 206.0 lb

## 2017-08-05 DIAGNOSIS — Z0000 Encounter for general adult medical examination without abnormal findings: Secondary | ICD-10-CM | POA: Diagnosis not present

## 2017-08-05 DIAGNOSIS — Z1211 Encounter for screening for malignant neoplasm of colon: Secondary | ICD-10-CM

## 2017-08-05 NOTE — Patient Instructions (Addendum)
[name] , Thank you for taking time to come for your Medicare Wellness Visit. I appreciate your ongoing commitment to your health goals. Please review the following plan we discussed and let me know if I can assist you in the future.   Screening recommendations/referrals: Colonoscopy: cologuard ordered  Recommended yearly ophthalmology/optometry visit for glaucoma screening and checkup Recommended yearly dental visit for hygiene and checkup  Vaccinations: Influenza vaccine: due 09/2017 Pneumococcal vaccine: completed series Tdap vaccine: not eligible Shingles vaccine: shingrix eligible, check with your Insurance company for coverage   Advanced directives: Advance directive discussed with you today. I have provided a copy for you to complete at home and have notarized. Once this is complete please bring a copy in to our office so we can scan it into your chart.  Conditions/risks identified: Recommend drinking at least 6-8 glasses of water a day.  Next appointment: Follow up in one year for your annual wellness exam.   Preventive Care 65 Years and Older, Male Preventive care refers to lifestyle choices and visits with your health care provider that can promote health and wellness. What does preventive care include?  A yearly physical exam. This is also called an annual well check.  Dental exams once or twice a year.  Routine eye exams. Ask your health care provider how often you should have your eyes checked.  Personal lifestyle choices, including:  Daily care of your teeth and gums.  Regular physical activity.  Eating a healthy diet.  Avoiding tobacco and drug use.  Limiting alcohol use.  Practicing safe sex.  Taking low doses of aspirin every day.  Taking vitamin and mineral supplements as recommended by your health care provider. What happens during an annual well check? The services and screenings done by your health care provider during your annual well check will  depend on your age, overall health, lifestyle risk factors, and family history of disease. Counseling  Your health care provider may ask you questions about your:  Alcohol use.  Tobacco use.  Drug use.  Emotional well-being.  Home and relationship well-being.  Sexual activity.  Eating habits.  History of falls.  Memory and ability to understand (cognition).  Work and work Astronomerenvironment. Screening  You may have the following tests or measurements:  Height, weight, and BMI.  Blood pressure.  Lipid and cholesterol levels. These may be checked every 5 years, or more frequently if you are over 71 years old.  Skin check.  Lung cancer screening. You may have this screening every year starting at age 71 if you have a 30-pack-year history of smoking and currently smoke or have quit within the past 15 years.  Fecal occult blood test (FOBT) of the stool. You may have this test every year starting at age 71.  Flexible sigmoidoscopy or colonoscopy. You may have a sigmoidoscopy every 5 years or a colonoscopy every 10 years starting at age 71.  Prostate cancer screening. Recommendations will vary depending on your family history and other risks.  Hepatitis C blood test.  Hepatitis B blood test.  Sexually transmitted disease (STD) testing.  Diabetes screening. This is done by checking your blood sugar (glucose) after you have not eaten for a while (fasting). You may have this done every 1-3 years.  Abdominal aortic aneurysm (AAA) screening. You may need this if you are a current or former smoker.  Osteoporosis. You may be screened starting at age 570 if you are at high risk. Talk with your health care provider  about your test results, treatment options, and if necessary, the need for more tests. Vaccines  Your health care provider may recommend certain vaccines, such as:  Influenza vaccine. This is recommended every year.  Tetanus, diphtheria, and acellular pertussis (Tdap,  Td) vaccine. You may need a Td booster every 10 years.  Zoster vaccine. You may need this after age 7.  Pneumococcal 13-valent conjugate (PCV13) vaccine. One dose is recommended after age 65.  Pneumococcal polysaccharide (PPSV23) vaccine. One dose is recommended after age 69. Talk to your health care provider about which screenings and vaccines you need and how often you need them. This information is not intended to replace advice given to you by your health care provider. Make sure you discuss any questions you have with your health care provider. Document Released: 02/03/2015 Document Revised: 09/27/2015 Document Reviewed: 11/08/2014 Elsevier Interactive Patient Education  2017 [employer] Prevention in the Home Falls can cause injuries. They can happen to people of all ages. There are many things you can do to make your home safe and to help prevent falls. What can I do on the outside of my home?  Regularly fix the edges of walkways and driveways and fix any cracks.  Remove anything that might make you trip as you walk through a door, such as a raised step or threshold.  Trim any bushes or trees on the path to your home.  Use bright outdoor lighting.  Clear any walking paths of anything that might make someone trip, such as rocks or tools.  Regularly check to see if handrails are loose or broken. Make sure that both sides of any steps have handrails.  Any raised decks and porches should have guardrails on the edges.  Have any leaves, snow, or ice cleared regularly.  Use sand or salt on walking paths during winter.  Clean up any spills in your garage right away. This includes oil or grease spills. What can I do in the bathroom?  Use night lights.  Install grab bars by the toilet and in the tub and shower. Do not use towel bars as grab bars.  Use non-skid mats or decals in the tub or shower.  If you need to sit down in the shower, use a plastic, non-slip  stool.  Keep the floor dry. Clean up any water that spills on the floor as soon as it happens.  Remove soap buildup in the tub or shower regularly.  Attach bath mats securely with double-sided non-slip rug tape.  Do not have throw rugs and other things on the floor that can make you trip. What can I do in the bedroom?  Use night lights.  Make sure that you have a light by your bed that is easy to reach.  Do not use any sheets or blankets that are too big for your bed. They should not hang down onto the floor.  Have a firm chair that has side arms. You can use this for support while you get dressed.  Do not have throw rugs and other things on the floor that can make you trip. What can I do in the kitchen?  Clean up any spills right away.  Avoid walking on wet floors.  Keep items that you use a lot in easy-to-reach places.  If you need to reach something above you, use a strong step stool that has a grab bar.  Keep electrical cords out of the way.  Do not use floor polish or  wax that makes floors slippery. If you must use wax, use non-skid floor wax.  Do not have throw rugs and other things on the floor that can make you trip. What can I do with my stairs?  Do not leave any items on the stairs.  Make sure that there are handrails on both sides of the stairs and use them. Fix handrails that are broken or loose. Make sure that handrails are as long as the stairways.  Check any carpeting to make sure that it is firmly attached to the stairs. Fix any carpet that is loose or worn.  Avoid having throw rugs at the top or bottom of the stairs. If you do have throw rugs, attach them to the floor with carpet tape.  Make sure that you have a light switch at the top of the stairs and the bottom of the stairs. If you do not have them, ask someone to add them for you. What else can I do to help prevent falls?  Wear shoes that:  Do not have high heels.  Have rubber bottoms.  Are  comfortable and fit you well.  Are closed at the toe. Do not wear sandals.  If you use a stepladder:  Make sure that it is fully opened. Do not climb a closed stepladder.  Make sure that both sides of the stepladder are locked into place.  Ask someone to hold it for you, if possible.  Clearly mark and make sure that you can see:  Any grab bars or handrails.  First and last steps.  Where the edge of each step is.  Use tools that help you move around (mobility aids) if they are needed. These include:  Canes.  Walkers.  Scooters.  Crutches.  Turn on the lights when you go into a dark area. Replace any light bulbs as soon as they burn out.  Set up your furniture so you have a clear path. Avoid moving your furniture around.  If any of your floors are uneven, fix them.  If there are any pets around you, be aware of where they are.  Review your medicines with your doctor. Some medicines can make you feel dizzy. This can increase your chance of falling. Ask your doctor what other things that you can do to help prevent falls. This information is not intended to replace advice given to you by your health care provider. Make sure you discuss any questions you have with your health care provider. Document Released: 11/03/2008 Document Revised: 06/15/2015 Document Reviewed: 02/11/2014 Elsevier Interactive Patient Education  2017 [employer].

## 2017-08-05 NOTE — Progress Notes (Signed)
Subjective:   [name] is a 72 y.o. male who presents for Medicare Annual/Subsequent preventive examination.  Review of Systems:   Cardiac Risk Factors include: diabetes mellitus;dyslipidemia;hypertension;male gender;advanced age (>53men, >47 women);smoking/ tobacco exposure     Objective:    Vitals: BP (!) 142/78 (BP Location: Left Arm, Cuff Size: Normal)   Pulse 62   Temp 98.4 F (36.9 C) (Oral)   Resp 16   Ht 6' (1.829 m)   Wt 206 lb (93.4 kg)   BMI 27.94 kg/m   Body mass index is 27.94 kg/m.  Advanced Directives 08/05/2017 05/22/2016 12/08/2014  Does Patient Have a Medical Advance Directive? No No No  Would patient like information on creating a medical advance directive? Yes (MAU/Ambulatory/Procedural Areas - Information given) - No - patient declined information    Tobacco Social History   Tobacco Use  Smoking Status Current Some Day Smoker  . Types: Cigarettes  Smokeless Tobacco Never Used  Tobacco Comment   pack of cigarettes last 1-2 months - doesnt inhale     Ready to quit: No Counseling given: Yes Comment: pack of cigarettes last 1-2 months - doesnt inhale   Clinical Intake:  Pre-visit preparation completed: Yes  Pain : No/denies pain     Nutritional Status: BMI 25 -29 Overweight Nutritional Risks: None Diabetes: No  How often do you need to have someone help you when you read instructions, pamphlets, or other written materials from your doctor or pharmacy?: 1 - Never What is the last grade level you completed in school?: 12th grade  Interpreter Needed?: No  Information entered by :: Asra Gambrel,LPN   Past Medical History:  Diagnosis Date  . Diabetes mellitus without complication (HCC)   . Erectile dysfunction   . Hypertension   . Right-sided chest pain    Past Surgical History:  Procedure Laterality Date  . ROTATOR CUFF REPAIR     Family History  Problem Relation Age of Onset  . Cancer Brother        Throat   Social History    Socioeconomic History  . Marital status: Divorced    Spouse name: Not on file  . Number of children: Not on file  . Years of education: Not on file  . Highest education level: Not on file  Occupational History  . Not on file  Social Needs  . Financial resource strain: Not hard at all  . Food insecurity:    Worry: Never true    Inability: Never true  . Transportation needs:    Medical: No    Non-medical: No  Tobacco Use  . Smoking status: Current Some Day Smoker    Types: Cigarettes  . Smokeless tobacco: Never Used  . Tobacco comment: pack of cigarettes last 1-2 months - doesnt inhale  Substance and Sexual Activity  . Alcohol use: Yes    Alcohol/week: 0.6 oz    Types: 1 Cans of beer per week    Comment: 1-2 beers week   . Drug use: No  . Sexual activity: Not on file  Lifestyle  . Physical activity:    Days per week: 0 days    Minutes per session: 0 min  . Stress: Not at all  Relationships  . Social connections:    Talks on phone: More than three times a week    Gets together: More than three times a week    Attends religious service: Never    Active member of club or organization: No  Attends meetings of clubs or organizations: Never    Relationship status: Divorced  Other Topics Concern  . Not on file  Social History Narrative   Working 1-2 days a week .     Outpatient Encounter Medications as of 08/05/2017  Medication Sig  . Dulaglutide (TRULICITY) 1.5 MG/0.5ML SOPN Inject 1.5 mg into the skin once a week.  [name]. glucose blood (ONE TOUCH ULTRA TEST) test strip TEST TWICE DAILY  . lisinopril-hydrochlorothiazide (PRINZIDE,ZESTORETIC) 20-12.5 MG tablet Take 1 tablet by mouth daily.  . metFORMIN (GLUCOPHAGE) 1000 MG tablet Take 1 tablet (1,000 mg total) by mouth 2 (two) times daily with a meal.  . naproxen (NAPROSYN) 500 MG tablet Take 1 tablet (500 mg total) by mouth 2 (two) times daily with a meal. For 2-4 weeks then as needed  . ONETOUCH DELICA LANCETS 33G MISC  1 each by Does not apply route 3 (three) times daily.  . pravastatin (PRAVACHOL) 10 MG tablet Take 1 tablet (10 mg total) daily by mouth.  . selenium sulfide (SELSUN) 2.5 % shampoo Apply topically daily as needed for irritation.  . sildenafil (VIAGRA) 100 MG tablet Take 100 mg by mouth daily as needed.   No facility-administered encounter medications on file as of 08/05/2017.     Activities of Daily Living In your present state of health, do you have any difficulty performing the following activities: 08/05/2017 06/04/2017  Hearing? N N  Vision? N N  Difficulty concentrating or making decisions? N N  Walking or climbing stairs? N N  Dressing or bathing? N N  Doing errands, shopping? N N  Preparing Food and eating ? N -  Using the Toilet? N -  In the past six months, have you accidently leaked urine? N -  Do you have problems with loss of bowel control? N -  Managing your Medications? N -  Managing your Finances? N -  Housekeeping or managing your Housekeeping? N -  Some recent data might be hidden    Patient Care Team: [name], Alexander J, DO as PCP - General (Family Medicine)   Assessment:   This is a routine wellness examination for [name].  Exercise Activities and Dietary recommendations Current Exercise Habits: The patient does not participate in regular exercise at present, Exercise limited by: None identified  Goals    . DIET - INCREASE WATER INTAKE     Recommend drinking at least 6-8 glasses of water a day        Fall Risk Fall Risk  08/05/2017 06/04/2017 03/05/2017 12/02/2016 05/22/2016  Falls in the past year? No No No No No   Is the patient's home free of loose throw rugs in walkways, pet beds, electrical cords, etc?   yes      Grab bars in the bathroom? no      Handrails on the stairs?   yes      Adequate lighting?   yes  Timed Get Up and Go Performed: Completed in 8 seconds with no use of assistive devices, steady gait. No intervention needed at this time.     Depression Screen PHQ 2/9 Scores 08/05/2017 06/04/2017 03/05/2017 12/02/2016  PHQ - 2 Score 0 0 0 0  PHQ- 9 Score - - - -    Cognitive Function     6CIT Screen 08/05/2017 05/22/2016  What Year? 0 points 0 points  What month? 0 points 0 points  What time? 0 points 0 points  Count back from 20 0 points 0 points  Months in reverse 0 points 0 points  Repeat phrase 0 points 4 points  Total Score 0 4    Immunization History  Administered Date(s) Administered  . Influenza, High Dose Seasonal PF 11/07/2016  . Influenza-Unspecified 12/21/2013, 09/06/2015, 10/22/2015  . Pneumococcal Conjugate-13 12/08/2014  . Pneumococcal Polysaccharide-23 05/06/2012  . Tdap 02/04/2014    Qualifies for Shingles Vaccine? Yes, discussed shingrix vaccine   Screening Tests Health Maintenance  Topic Date Due  . Fecal DNA (Cologuard)  03/08/1996  . TETANUS/TDAP  07/20/2021 (Originally 03/08/1964)  . INFLUENZA VACCINE  08/21/2017  . HEMOGLOBIN A1C  11/29/2017  . FOOT EXAM  12/02/2017  . OPHTHALMOLOGY EXAM  06/20/2018  . Hepatitis C Screening  Completed  . PNA vac Low Risk Adult  Completed   Cancer Screenings: Lung: Low Dose CT Chest recommended if Age 86-80 years, 30 pack-year currently smoking OR have quit w/in 15years. Patient does not qualify. Colorectal: cologuard re-ordered, gave instructions on cologuard as well.   Additional Screenings:  Hepatitis C Screening:not indicated       Plan:    I have personally reviewed and addressed the Medicare Annual Wellness questionnaire and have noted the following in the patient's chart:  A. Medical and social history B. Use of alcohol, tobacco or illicit drugs  C. Current medications and supplements D. Functional ability and status E.  Nutritional status F.  Physical activity G. Advance directives H. List of other physicians I.  Hospitalizations, surgeries, and ER visits in previous 12 months J.  Vitals K. Screenings such as hearing and vision if  needed, cognitive and depression L. Referrals and appointments   In addition, I have reviewed and discussed with patient certain preventive protocols, quality metrics, and best practice recommendations. A written personalized care plan for preventive services as well as general preventive health recommendations were provided to patient.   Signed,  Marin Roberts, LPN Nurse Health Advisor   Nurse Notes:none

## 2017-08-18 DIAGNOSIS — Z1211 Encounter for screening for malignant neoplasm of colon: Secondary | ICD-10-CM | POA: Diagnosis not present

## 2017-08-18 DIAGNOSIS — Z1212 Encounter for screening for malignant neoplasm of rectum: Secondary | ICD-10-CM | POA: Diagnosis not present

## 2017-08-18 LAB — COLOGUARD: COLOGUARD: NEGATIVE

## 2017-09-29 ENCOUNTER — Telehealth: Payer: Self-pay | Admitting: Family Medicine

## 2017-09-29 ENCOUNTER — Encounter: Payer: Self-pay | Admitting: Urology

## 2017-09-29 ENCOUNTER — Other Ambulatory Visit: Payer: Self-pay

## 2017-09-29 ENCOUNTER — Ambulatory Visit: Payer: Medicare HMO | Admitting: Urology

## 2017-09-29 VITALS — BP 130/80 | HR 66 | Ht 71.0 in | Wt 204.6 lb

## 2017-09-29 DIAGNOSIS — N5201 Erectile dysfunction due to arterial insufficiency: Secondary | ICD-10-CM

## 2017-09-29 MED ORDER — TADALAFIL 20 MG PO TABS: 20.0000 mg | ORAL_TABLET | Freq: Every day | ORAL | 0 refills | Status: DC | PRN

## 2017-09-29 NOTE — Telephone Encounter (Signed)
Pt called requesting a prescription for Cialis 20 mg  per pt he saw  Urology this morning and  Recommend this medication. Pt call back # is [phone]

## 2017-09-29 NOTE — Telephone Encounter (Signed)
Notified, and reviewed chart, this was rx by Urology already today.  Saralyn Pilar, DO [facility] 09/29/2017, 1:06 PM

## 2017-09-29 NOTE — Progress Notes (Signed)
09/29/2017 1:04 PM   [name] 1945-02-10 [id number]  Referring provider: [name], DO [address] [place], [place] [address]  Chief Complaint  Patient presents with  . Erectile Dysfunction    HPI: 72 year old male presents for evaluation of erectile dysfunction.  He discuss ED with Dr. Althea Charon in May 2019.  He had obtained sildenafil 100 mg however states it was not effective.  He presently has partial erections which he states are firm enough for penetration proximally 50% of the time however he has difficulty maintaining the erection.  Denies pain or curvature with erections.  Organic risk factors include diabetes, hyper cholesterolemia, hypertension and antihypertensive medications.  He is a current tobacco user and estimates a pack of cigarettes last him 1 to 2 months.  He wanted to discuss other options today.  He has no bothersome lower urinary tract symptoms.  Recent PSA was 0.2.   PMH: Past Medical History:  Diagnosis Date  . Diabetes mellitus without complication (HCC)   . Erectile dysfunction   . Hypertension   . Right-sided chest pain     Surgical History: Past Surgical History:  Procedure Laterality Date  . ROTATOR CUFF REPAIR      Home Medications:  Allergies as of 09/29/2017      Reactions   2,4-d Dimethylamine (amisol) Other (See Comments)   Tetanus Toxoid Nausea And Vomiting   Tetanus-diphtheria Toxoids Td Other (See Comments)      Medication List        Accurate as of 09/29/17  1:04 PM. Always use your most recent med list.          Dulaglutide 1.5 MG/0.5ML Sopn Inject 1.5 mg into the skin once a week.   glucose blood test strip TEST TWICE DAILY   lisinopril-hydrochlorothiazide 20-12.5 MG tablet Commonly known as:  PRINZIDE,ZESTORETIC Take 1 tablet by mouth daily.   metFORMIN 1000 MG tablet Commonly known as:  GLUCOPHAGE Take 1 tablet (1,000 mg total) by mouth 2 (two) times daily with a meal.   naproxen 500 MG  tablet Commonly known as:  NAPROSYN Take 1 tablet (500 mg total) by mouth 2 (two) times daily with a meal. For 2-4 weeks then as needed   ONETOUCH DELICA LANCETS 33G Misc 1 each by Does not apply route 3 (three) times daily.   pravastatin 10 MG tablet Commonly known as:  PRAVACHOL Take 1 tablet (10 mg total) daily by mouth.   selenium sulfide 2.5 % shampoo Commonly known as:  SELSUN Apply topically daily as needed for irritation.   sildenafil 100 MG tablet Commonly known as:  VIAGRA Take 100 mg by mouth daily as needed.       Allergies:  Allergies  Allergen Reactions  . 2,4-D Dimethylamine (Amisol) Other (See Comments)  . Tetanus Toxoid Nausea And Vomiting  . Tetanus-Diphtheria Toxoids Td Other (See Comments)    Family History: Family History  Problem Relation Age of Onset  . Cancer Brother        Throat    Social History:  reports that he has been smoking cigarettes. He has never used smokeless tobacco. He reports that he drinks about 1.0 standard drinks of alcohol per week. He reports that he does not use drugs.  ROS: UROLOGY Frequent Urination?: No Hard to postpone urination?: No Burning/pain with urination?: No Get up at night to urinate?: No Leakage of urine?: No Urine stream starts and stops?: No Trouble starting stream?: No Do you have to strain to urinate?:  No Blood in urine?: No Urinary tract infection?: No Sexually transmitted disease?: No Injury to kidneys or bladder?: No Painful intercourse?: No Weak stream?: No Erection problems?: Yes Penile pain?: No  Gastrointestinal Nausea?: No Vomiting?: No Indigestion/heartburn?: No Diarrhea?: No Constipation?: No  Constitutional Fever: No Night sweats?: No Weight loss?: No Fatigue?: No  Skin Skin rash/lesions?: No Itching?: No  Eyes Blurred vision?: No Double vision?: No  Ears/Nose/Throat Sore throat?: No Sinus problems?: No  Hematologic/Lymphatic Swollen glands?: No Easy  bruising?: No  Cardiovascular Leg swelling?: No Chest pain?: No  Respiratory Cough?: No Shortness of breath?: No  Endocrine Excessive thirst?: No  Musculoskeletal Back pain?: No Joint pain?: No  Neurological Headaches?: No Dizziness?: No  Psychologic Depression?: No Anxiety?: No  Physical Exam: BP 130/80   Pulse 66   Ht 5\' 11"  (1.803 m)   Wt 204 lb 9.6 oz (92.8 kg)   BMI 28.54 kg/m   Constitutional:  Alert and oriented, No acute distress. HEENT: [place] AT, moist mucus membranes.  Trachea midline, no masses. Cardiovascular: No clubbing, cyanosis, or edema. Respiratory: Normal respiratory effort, no increased work of breathing. GI: Abdomen is soft, nontender, nondistended, no abdominal masses GU: No CVA tenderness Lymph: No cervical or inguinal lymphadenopathy. Skin: No rashes, bruises or suspicious lesions. Neurologic: Grossly intact, no focal deficits, moving all 4 extremities. Psychiatric: Normal mood and affect.   Assessment & Plan:   72 year old male with erectile dysfunction.  He was informed that PDE 5 inhibitors as class typically have equal efficacy.  I discussed vacuum erection devices and intracavernosal injections and he was provided literature on these therapies.  He wanted to read over the literature.  I did offer him a trial of generic tadalafil and he was interested in this option prior to pursuing other therapies.  He will call back regarding efficacy.   Return if symptoms worsen or fail to improve.  Riki Altes, MD  [facility] [address], [address] [place], [place] [address] [phone]

## 2017-10-08 ENCOUNTER — Ambulatory Visit: Payer: Medicare HMO | Admitting: Family Medicine

## 2017-10-14 ENCOUNTER — Ambulatory Visit (INDEPENDENT_AMBULATORY_CARE_PROVIDER_SITE_OTHER): Payer: Medicare HMO | Admitting: Family Medicine

## 2017-10-14 ENCOUNTER — Encounter: Payer: Self-pay | Admitting: Family Medicine

## 2017-10-14 VITALS — BP 137/66 | HR 81 | Temp 98.7°F | Resp 15 | Ht 71.0 in | Wt 205.0 lb

## 2017-10-14 DIAGNOSIS — I10 Essential (primary) hypertension: Secondary | ICD-10-CM | POA: Diagnosis not present

## 2017-10-14 DIAGNOSIS — N529 Male erectile dysfunction, unspecified: Secondary | ICD-10-CM

## 2017-10-14 DIAGNOSIS — K644 Residual hemorrhoidal skin tags: Secondary | ICD-10-CM

## 2017-10-14 DIAGNOSIS — E113293 Type 2 diabetes mellitus with mild nonproliferative diabetic retinopathy without macular edema, bilateral: Secondary | ICD-10-CM | POA: Diagnosis not present

## 2017-10-14 LAB — POCT GLYCOSYLATED HEMOGLOBIN (HGB A1C): HEMOGLOBIN A1C: 6.5 % — AB (ref 4.0–5.6)

## 2017-10-14 MED ORDER — DULAGLUTIDE 1.5 MG/0.5ML ~~LOC~~ SOAJ
1.5000 mg | SUBCUTANEOUS | 0 refills | Status: DC
Start: 2017-10-14 — End: 2017-10-14

## 2017-10-14 NOTE — Assessment & Plan Note (Addendum)
Stable ED, likely multifactorial likely some sexual desire factors as well Improved per Urology on Cialis PRN Declined other interventions.

## 2017-10-14 NOTE — Assessment & Plan Note (Signed)
Improved A1c control 6.5 Followed by Dr Clydene PughWoodard - last DM ophtho 05/2017, stable DM retinopathy, monitored not require treatment

## 2017-10-14 NOTE — Progress Notes (Signed)
Subjective:    Patient ID: [name], male    DOB: Jun 11, 1945, 72 y.o.   MRN: [id number]  [name] is a 73 y.o. male presenting on 10/14/2017 for Diabetes   HPI   CHRONIC DM, Type 2 w/ Retinopathy Today doing well. Pleased with improved A1c 6.5. He is on meds intermittently Trulicity due to cost. CBGs: Avg 120-140, Low 89 (asymptomatic), High< 175. Checks CBGsmost days Meds:Trulicity 1.5mg  weekly injection (misses about every other dose spacing out due to meds, currently out of med), Metformin 1000mg  BID - Remains off Glimepiride Reports good compliance. Tolerating well w/o side-effects Currently on ACEi Lifestyle: - Diet (Trying to improve diet) - Exercise (Limited exercise due to time - gradually improving) - Dr Clydene Pugh last DM Eye exam 05/2017 w/ DM retinopathy, requiring monitoring Denies hypoglycemia  CHRONIC HTN: Reports no new concern, checks BP occasional Current Meds -Lisinopril-HCTZ 20-12.5mg  daily Reports good compliance, took meds today. Tolerating well, w/o complaints Denies CP, dyspnea, HA, edema, dizziness / lightheadedness  Erectile Dysfunction Followed by [facility] - They offered alternative options with local injection self admin or suction device, and he has opted for change of med for Cialis with some good results, takes intermittently. He has failed prior Sildenafil 100mg . - He also admits some difficulty with sexual desire at times  Rectal Irritation / Rectal Pain He noticed some discomfort with irritation at times. With prolonged sitting. He does not have constipation or rectal bleeding. It is episodic at times. He used an alcohol wipe and it "burned". No documented prior hemorrhoids or treatment.  Health Maintenance: Due for Flu vaccine, no high dose in stock, return  Depression screen [facility] 2/9 10/14/2017 08/05/2017 06/04/2017  Decreased Interest 0 0 0  Down, Depressed, Hopeless 0 0 0  PHQ - 2 Score 0 0 0  Altered sleeping - - -  Tired,  decreased energy - - -  Change in appetite - - -  Feeling bad or failure about yourself  - - -  Trouble concentrating - - -  Moving slowly or fidgety/restless - - -  Suicidal thoughts - - -  PHQ-9 Score - - -  Difficult doing work/chores - - -    Social History   Tobacco Use  . Smoking status: Current Some Day Smoker    Types: Cigarettes  . Smokeless tobacco: Current User  . Tobacco comment: pack of cigarettes last 1-2 months - doesnt inhale  Substance Use Topics  . Alcohol use: Yes    Alcohol/week: 1.0 standard drinks    Types: 1 Cans of beer per week    Comment: 1-2 beers week   . Drug use: No    Review of Systems Per HPI unless specifically indicated above     Objective:    BP 137/66   Pulse 81   Temp 98.7 F (37.1 C) (Oral)   Resp 15   Ht 5\' 11"  (1.803 m)   Wt 205 lb (93 kg)   BMI 28.59 kg/m   Wt Readings from Last 3 Encounters:  10/14/17 205 lb (93 kg)  09/29/17 204 lb 9.6 oz (92.8 kg)  08/05/17 206 lb (93.4 kg)    Physical Exam  Constitutional: He is oriented to person, place, and time. He appears well-developed and well-nourished. No distress.  Well-appearing, comfortable, cooperative  HENT:  Head: Normocephalic and atraumatic.  Mouth/Throat: Oropharynx is clear and moist.  Eyes: Conjunctivae are normal. Right eye exhibits no discharge. Left eye exhibits no discharge.  Neck: Normal  range of motion. Neck supple. No thyromegaly present.  Cardiovascular: Normal rate, regular rhythm, normal heart sounds and intact distal pulses.  No murmur heard. Pulmonary/Chest: Effort normal and breath sounds normal. No respiratory distress. He has no wheezes. He has no rales.  Genitourinary:  Genitourinary Comments: Rectal/DRE: External exam rectum with inferior aspect 6 o clock large residual skin tag without obvious active anal fissure and some palpable deeper swollen tissue on early DRE some tender and agree to defer full DRE exam given findings. No protruding  external hemorrhoids  Musculoskeletal: Normal range of motion. He exhibits no edema.  Lymphadenopathy:    He has no cervical adenopathy.  Neurological: He is alert and oriented to person, place, and time.  Skin: Skin is warm and dry. No rash noted. He is not diaphoretic. No erythema.  Psychiatric: He has a normal mood and affect. His behavior is normal.  Well groomed, good eye contact, normal speech and thoughts  Nursing note and vitals reviewed.    Diabetic Foot Exam - Simple   Simple Foot Form Diabetic Foot exam was performed with the following findings:  Yes 10/14/2017  3:11 PM  Visual Inspection See comments:  Yes Sensation Testing See comments:  Yes Pulse Check Posterior Tibialis and Dorsalis pulse intact bilaterally:  Yes Comments Mild dry skin bilateral. Callus formation R>L heel and some forefoot. Mild reduced monofilament R heel otherwise overall sensation intact bilateral plantar and dorsal.     Recent Labs    03/05/17 0818 05/29/17 0812 10/14/17 1457  HGBA1C 7.4* 6.7* 6.5*  '  Results for orders placed or performed in visit on 10/14/17  POCT HgB A1C  Result Value Ref Range   Hemoglobin A1C 6.5 (A) 4.0 - 5.6 %      Assessment & Plan:   Problem List Items Addressed This Visit    Controlled type 2 diabetes mellitus with retinopathy (HCC) - Primary    Further continued improved A1c control down to 6.5, steady decline with good results on GLP1, limited use by cost / misses some doses Complications - DM retinopathy and Hyperlipidemia - increases risk of vascular complication Remain OFF Glimepiride  Plan: 1. CONTINUE Trulicity 1.5mg  weekly [place] injection - has active rx - Provided patient with 4 sample pens Trulicity 1.5 (2 boxes, 2 pen each) due to medicare donut hole cost limitation. He may still alternate dosing using every other week if he prefers due to financial situation - may notify us again within 1-2 months for another month supply samples - Continue  Metformin 1000mg  BID 2. Encourage improved lifestyle - low carb, low sugar diet, reduce portion size, start regular exercise 3. Check CBG, bring log to next visit for review 4. Continue ASA, ACEi, resumed Statin 5. Completed DM Foot exam today. UTD DM Eye Dr Clydene PughWoodard 05/2017 for retinopathy 6. Follow-up 6 months DM A1c      Relevant Orders   POCT HgB A1C (Completed)   Diabetic retinopathy (HCC)    Improved A1c control 6.5 Followed by Dr Clydene PughWoodard - last DM ophtho 05/2017, stable DM retinopathy, monitored not require treatment      ED (erectile dysfunction) of organic origin    Stable ED, likely multifactorial likely some sexual desire factors as well Improved per Urology on Cialis PRN Declined other interventions.      Essential hypertension    Controlled BP - Home BP readings infrequent but improved No known complications    Plan:  1. Continue current BP regimen - Lisinopril-HCTZ 20-12.5mg  daily  2. Encourage improved lifestyle - low sodium diet,  Improve exercise 3. Continue monitor BP outside office, bring readings to next visit, if persistently >140/90 or new symptoms notify office sooner      External hemorrhoid    Acute inferior 6 o clock position internal vs external hemorrhoid without thrombosed or protrusion, seems not flared up at this time. No active bleeding. Likely provoked prolong sitting and pressure / constipation Unable to appreciate deeper internal hemorrhoids limited exam today. Skin tag likely from anal fissure previously No sign of erythema or cellulitis. - Inadequate conservative therapy   Plan: 1. Start OTC topical Preparation H steroid cream - If not effective - can call for rx Anusol-HC hydrocortisone 25mg  suppository BID for 7 days, given 1 refill if need repeat course 2. Start Sitz Baths or warm bathtub soaks to help resolve flare 3. Avoid constipation and straining, recommend high fiber diet, improve hydration, miralax PRN 4. May take NSAID PRN 5.  Caution with prolonged sitting - needs cushion / change position if possible 6. Reviewed return criteria if not improving         Additionally - Patient has history of prior sebaceous cyst midline lower thoracic and also complains of similar mobile sebaceous cyst upper back / neck. Advised reassurance on these without requiring intervention unless bothersome, painful, infected or requested for cosmetic reason - could refer him to General Surgery as needed in future for these.  Meds ordered this encounter  Medications  . DISCONTD: Dulaglutide (TRULICITY) 1.5 MG/0.5ML SOPN    Sig: Inject 1.5 mg into the skin once a week.    Dispense:  4 pen    Refill:  0  THIS WAS A SAMPLE RX LOGGED IN EHR TO DOCUMENT GIVEN TO PATIENT, NOW REMOVED FROM HIS MED LIST AS IT IS A DUPLICATE.    Follow up plan: Return in about 6 months (around 04/14/2018) for DM A1c, HTN.  Saralyn Pilar, DO [facility] 10/14/2017, 7:19 PM

## 2017-10-14 NOTE — Assessment & Plan Note (Signed)
Controlled BP - Home BP readings infrequent but improved No known complications    Plan:  1. Continue current BP regimen - Lisinopril-HCTZ 20-12.5mg daily 2. Encourage improved lifestyle - low sodium diet,  Improve exercise 3. Continue monitor BP outside office, bring readings to next visit, if persistently >140/90 or new symptoms notify office sooner 

## 2017-10-14 NOTE — Assessment & Plan Note (Signed)
Further continued improved A1c control down to 6.5, steady decline with good results on GLP1, limited use by cost / misses some doses Complications - DM retinopathy and Hyperlipidemia - increases risk of vascular complication Remain OFF Glimepiride  Plan: 1. CONTINUE Trulicity 1.5mg  weekly [place] injection - has active rx - Provided patient with 4 sample pens Trulicity 1.5 (2 boxes, 2 pen each) due to medicare donut hole cost limitation. He may still alternate dosing using every other week if he prefers due to financial situation - may notify us again within 1-2 months for another month supply samples - Continue Metformin 1000mg  BID 2. Encourage improved lifestyle - low carb, low sugar diet, reduce portion size, start regular exercise 3. Check CBG, bring log to next visit for review 4. Continue ASA, ACEi, resumed Statin 5. Completed DM Foot exam today. UTD DM Eye Dr Clydene PughWoodard 05/2017 for retinopathy 6. Follow-up 6 months DM A1c

## 2017-10-14 NOTE — Assessment & Plan Note (Signed)
Acute inferior 6 o clock position internal vs external hemorrhoid without thrombosed or protrusion, seems not flared up at this time. No active bleeding. Likely provoked prolong sitting and pressure / constipation Unable to appreciate deeper internal hemorrhoids limited exam today. Skin tag likely from anal fissure previously No sign of erythema or cellulitis. - Inadequate conservative therapy   Plan: 1. Start OTC topical Preparation H steroid cream - If not effective - can call for rx Anusol-HC hydrocortisone 25mg  suppository BID for 7 days, given 1 refill if need repeat course 2. Start Sitz Baths or warm bathtub soaks to help resolve flare 3. Avoid constipation and straining, recommend high fiber diet, improve hydration, miralax PRN 4. May take NSAID PRN 5. Caution with prolonged sitting - needs cushion / change position if possible 6. Reviewed return criteria if not improving

## 2017-10-14 NOTE — Patient Instructions (Addendum)
Thank you for coming to the office today.  Please schedule and return for a NURSE ONLY VISIT for VACCINE - Approximately 1-3 weeks in October 2019 - Need High Dose Flu Vaccine  A1c improved to 6.5  - Keep up the great work. Overall, gave samples of Trulicity 1.5 - we can get more in the future [facility] needed [facility]. November 2019 we can have some additional samples ready for you to pick up as well.  You have a non external hemorrhoid, which involves swollen veins on your rectum, it is very sensitive and causes your severe pain. You may experience worsening pain and bleeding with bright red blood if the hemorrhoid develops a superficial blood clot.   IF FLARE - Start with OTC Preparation H (topical hemorrhoid cream) usually 1-2 times a day for about 1 week to reduce inflammation and swelling and pain. - If not improved - can call us and request a rx Anusol Suppository (placed in rectum) twice a day for a week  - Recommend also try the warm bathtub soak 1-2 times daily for next week if you can, or can [facility] for just your bottom - Try to stay well hydrated, avoid constipation and straining, eat a high fiber diet  If you get significant worsening pain, rectal bleeding, or not responding to treatment, please notify our office and we will anticipate on an urgent referral to General Surgery office  Please schedule a Follow-up Appointment to: Return in about 6 months (around 04/14/2018) for DM A1c, HTN.  If you have any other questions or concerns, please feel free to call the office or send a message through MyChart. You may also schedule an earlier appointment if necessary.  Additionally, you may be receiving a survey about your experience at our office within a few days to 1 week by e-mail or mail. We value your feedback.  Christopher PilarAlexander Druanne Bosques, DO [facility]

## 2017-10-28 ENCOUNTER — Ambulatory Visit (INDEPENDENT_AMBULATORY_CARE_PROVIDER_SITE_OTHER): Payer: Medicare HMO

## 2017-10-28 DIAGNOSIS — Z23 Encounter for immunization: Secondary | ICD-10-CM | POA: Diagnosis not present

## 2018-03-11 ENCOUNTER — Telehealth: Payer: Self-pay | Admitting: Family Medicine

## 2018-03-11 NOTE — Telephone Encounter (Signed)
Pt needs most recent A1C for new job,  His call back number is [phone]

## 2018-03-11 NOTE — Telephone Encounter (Signed)
Printed and given to the patient

## 2018-03-13 ENCOUNTER — Encounter: Payer: Self-pay | Admitting: Family Medicine

## 2018-03-13 ENCOUNTER — Other Ambulatory Visit: Payer: Self-pay

## 2018-03-13 ENCOUNTER — Ambulatory Visit: Payer: Medicare HMO | Admitting: Family Medicine

## 2018-03-13 ENCOUNTER — Ambulatory Visit (INDEPENDENT_AMBULATORY_CARE_PROVIDER_SITE_OTHER): Payer: Medicare HMO | Admitting: Family Medicine

## 2018-03-13 VITALS — BP 124/80 | HR 70 | Temp 98.6°F | Resp 16 | Ht 71.0 in | Wt 206.0 lb

## 2018-03-13 DIAGNOSIS — E113293 Type 2 diabetes mellitus with mild nonproliferative diabetic retinopathy without macular edema, bilateral: Secondary | ICD-10-CM

## 2018-03-13 MED ORDER — DULAGLUTIDE 1.5 MG/0.5ML ~~LOC~~ SOAJ: 1.5000 mg | SUBCUTANEOUS | 0 refills | Status: DC

## 2018-03-13 NOTE — Progress Notes (Signed)
Subjective:    Patient ID: [name], male    DOB: Mar 02, 1945, 73 y.o.   MRN: [id number]  [name] is a 73 y.o. male presenting on 03/13/2018 for Diabetes   HPI   CHRONIC DM, Type 2w/ Retinopathy Today doing well. Previously improved A1c on Trulicity. He had difficulty with financial cost at end of 2019 due to medicare donut hole. He was given samples at that time, now he has not been able to get medicine in past >2 months, he has been out of Trulicity.  He will need A1c < 8% for his DOT requirement for work, deadline is 03/19/18  CBGs:Avg 140-150,Low >90 (asymptomatic), High< 175. Checks CBGsmost days Meds: - Metformin 1000mg  BID - Not taking but still has Glimepiride 4mg  daily w/ breakfast - CURRENTLY OUT OF - Trulicity 1.5mg  weekly inj Reports good compliance. Tolerating well w/o side-effects Currently on ACEi Lifestyle: - Diet (Trying to improve diet but not always able to adhere) - Exercise (Limited exercise due to time - gradually improving) - Dr Clydene Pugh last DM Eye exam 05/2017 w/ DM retinopathy, requiring monitoring Denies hypoglycemia, polyuria, visual changes, numbness or tingling.   Depression screen [facility] 2/9 10/14/2017 08/05/2017 06/04/2017  Decreased Interest 0 0 0  Down, Depressed, Hopeless 0 0 0  PHQ - 2 Score 0 0 0  Altered sleeping - - -  Tired, decreased energy - - -  Change in appetite - - -  Feeling bad or failure about yourself  - - -  Trouble concentrating - - -  Moving slowly or fidgety/restless - - -  Suicidal thoughts - - -  PHQ-9 Score - - -  Difficult doing work/chores - - -    Social History   Tobacco Use  . Smoking status: Current Some Day Smoker    Types: Cigarettes  . Smokeless tobacco: Current User  . Tobacco comment: pack of cigarettes last 1-2 months - doesnt inhale  Substance Use Topics  . Alcohol use: Yes    Alcohol/week: 1.0 standard drinks    Types: 1 Cans of beer per week    Comment: 1-2 beers week   . Drug use: No      Review of Systems Per HPI unless specifically indicated above     Objective:    BP 124/80   Pulse 70   Temp 98.6 F (37 C) (Oral)   Resp 16   Ht 5\' 11"  (1.803 m)   Wt 206 lb (93.4 kg)   BMI 28.73 kg/m   Wt Readings from Last 3 Encounters:  03/13/18 206 lb (93.4 kg)  10/14/17 205 lb (93 kg)  09/29/17 204 lb 9.6 oz (92.8 kg)    Physical Exam Vitals signs and nursing note reviewed.  Constitutional:      General: He is not in acute distress.    Appearance: He is well-developed. He is not diaphoretic.     Comments: Well-appearing, comfortable, cooperative  HENT:     Head: Normocephalic and atraumatic.  Eyes:     General:        Right eye: No discharge.        Left eye: No discharge.     Conjunctiva/sclera: Conjunctivae normal.  Cardiovascular:     Rate and Rhythm: Normal rate.  Pulmonary:     Effort: Pulmonary effort is normal.  Skin:    General: Skin is warm and dry.     Findings: No erythema or rash.  Neurological:     Mental Status: He  is alert and oriented to person, place, and time.  Psychiatric:        Behavior: Behavior normal.     Comments: Well groomed, good eye contact, normal speech and thoughts      Recent Labs    05/29/17 0812 10/14/17 1457  HGBA1C 6.7* 6.5*    Results for orders placed or performed in visit on 10/14/17  POCT HgB A1C  Result Value Ref Range   Hemoglobin A1C 6.5 (A) 4.0 - 5.6 %      Assessment & Plan:   Problem List Items Addressed This Visit    Controlled type 2 diabetes mellitus with retinopathy (HCC) - Primary    Due for A1c now, he will return for serum lab draw A1c on Monday April 06, 2022 before his upcoming DOT Deadline 2/27, goal is < 8% Previously well controlled on meds, now off meds for >2 months due to financial barrier Complications - DM retinopathy and Hyperlipidemia - increases risk of vascular complication  Plan: 1. RESTART Trulicity 1.5mg  weekly [place] injection - sample x 4 pen given for 1 month supply - We will  re order his Trulicity 1.5 weekly rx once ready - I will refer him to [facility] CM for pharmacy assistance in financial aid application - Continue Metformin 1000mg  BID - RESTART Glimepiride 4mg  1-2 per day for next 1 week to transition back to Trulicity then reduce back to 1 a day or may consider off in future again, but needs to adhere to trulicity to come off med 2. Encourage improved lifestyle - low carb, low sugar diet, reduce portion size, start regular exercise 3. Check CBG, bring log to next visit for review 4. Continue ASA, ACEi, resumed Statin 5. Follow-up 3 months annual / labs  Return Monday April 06, 2022 for A1c serum lab draw - will notify patient of result, print copy for him to turn in to DOT, goal remain < 8%      Relevant Medications   glimepiride (AMARYL) 4 MG tablet   Dulaglutide (TRULICITY) 1.5 MG/0.5ML SOPN   Other Relevant Orders   Hemoglobin A1c   AMB Referral to [facility] Care Management      Meds ordered this encounter  Medications  . Dulaglutide (TRULICITY) 1.5 MG/0.5ML SOPN    Sig: Inject 1.5 mg into the skin once a week.    Dispense:  4 pen    Refill:  0   Orders Placed This Encounter  Procedures  . Hemoglobin A1c    Standing Status:   Future    Standing Expiration Date:   03/22/2018  . AMB Referral to [facility] Care Management    Referral Priority:   Routine    Referral Type:   Consultation    Referral Reason:   [facility]-Care Management    Number of Visits Requested:   1    Follow up plan: Return in about 3 months (around 06/11/2018) for Annual Physical.  Future labs ordered for Monday 2022/04/06 for A1c serum lab draw.  A total of 27 minutes was spent face-to-face with this patient. Greater than 50% of this time (approximately 15 minutes) was spent in counseling on diabetes management, medication adherence and adjusting regimen, financial assistance and coordinating referral to [facility] for financial assistance.  Saralyn Pilar, DO [facility] 03/13/2018, 11:22 AM

## 2018-03-13 NOTE — Patient Instructions (Addendum)
Thank you for coming to the office today.  START back on Trulicity 1.5mg  weekly injection - samples today  We will reach out for financial assistance help through our [employer]. Stay tuned for this call and application.  Continue Metformin 1000mg  twice a day  INCREASE Glimepiride 4mg  - to TWO pills a day - one with breakfast and one with lunch - just temporary on twice a day,k once you are back on track with injections go back to one a day.  Ordered repeat A1c check lab draw for MONDAY  Please schedule a Follow-up Appointment to: Return in about 3 months (around 06/11/2018) for Annual Physical.  If you have any other questions or concerns, please feel free to call the office or send a message through MyChart. You may also schedule an earlier appointment if necessary.  Additionally, you may be receiving a survey about your experience at our office within a few days to 1 week by e-mail or mail. We value your feedback.  Saralyn Pilar, DO [facility], [place]

## 2018-03-13 NOTE — Assessment & Plan Note (Signed)
Due for A1c now, he will return for serum lab draw A1c on Monday 2022/03/28 before his upcoming DOT Deadline 2/27, goal is < 8% Previously well controlled on meds, now off meds for >2 months due to financial barrier Complications - DM retinopathy and Hyperlipidemia - increases risk of vascular complication  Plan: 1. RESTART Trulicity 1.5mg  weekly [place] injection - sample x 4 pen given for 1 month supply - We will re order his Trulicity 1.5 weekly rx once ready - I will refer him to [facility] CM for pharmacy assistance in financial aid application - Continue Metformin 1000mg  BID - RESTART Glimepiride 4mg  1-2 per day for next 1 week to transition back to Trulicity then reduce back to 1 a day or may consider off in future again, but needs to adhere to trulicity to come off med 2. Encourage improved lifestyle - low carb, low sugar diet, reduce portion size, start regular exercise 3. Check CBG, bring log to next visit for review 4. Continue ASA, ACEi, resumed Statin 5. Follow-up 3 months annual / labs  Return Monday 03/28/2022 for A1c serum lab draw - will notify patient of result, print copy for him to turn in to DOT, goal remain < 8%

## 2018-03-13 NOTE — Patient Outreach (Signed)
[facility]) Care Management  03/13/2018  Miqueas Rackers 04-24-1945 [id number]   Telephone Screen  Referral Date:03/13/2018 Referral Source: MD Office Referral Reason: " pharmacist help-financial assistance for Trulicity" Insurance:[employer]    Outreach attempt # 1 to patient. No answer. RN CM left HIPAA compliant voicemail message along with contact info.     Plan: RN CM will make outreach attempt to patient within 3-4 business days. RN CM will send unsuccessful outreach letter to patient.   Antionette Fairy, RN,BSN,CCM [facility] Care Management Telephonic Care Management Coordinator Direct Phone: [phone] Toll Free: [phone] Fax: [phone]

## 2018-03-16 ENCOUNTER — Ambulatory Visit: Payer: Medicare HMO | Admitting: Family Medicine

## 2018-03-16 ENCOUNTER — Other Ambulatory Visit: Payer: Self-pay

## 2018-03-16 ENCOUNTER — Other Ambulatory Visit: Payer: Medicare HMO

## 2018-03-16 DIAGNOSIS — E113293 Type 2 diabetes mellitus with mild nonproliferative diabetic retinopathy without macular edema, bilateral: Secondary | ICD-10-CM | POA: Diagnosis not present

## 2018-03-16 NOTE — Patient Outreach (Signed)
[facility]) Care Management  03/16/2018  [name] 06-24-1945 [id number]   Telephone Screen  Referral Date:03/13/2018 Referral Source: MD Office Referral Reason: " pharmacist help-financial assistance for Trulicity" Insurance:[employer] Medicare   Outreach attempt #2 to patient. No answer. RN CM left HIPAA compliant voicemail message along with contact info.     Plan: RN CN will make outreach attempt to patient within 3-4 business days.  Antionette Fairy, RN,BSN,CCM [facility] Care Management Telephonic Care Management Coordinator Direct Phone: [phone] Toll Free: [phone] Fax: [phone]

## 2018-03-17 ENCOUNTER — Encounter: Payer: Self-pay | Admitting: Family Medicine

## 2018-03-17 LAB — HEMOGLOBIN A1C
HEMOGLOBIN A1C: 8.7 %{Hb} — AB (ref <5.7)
MEAN PLASMA GLUCOSE: 203 (calc)
eAG (mmol/L): 11.2 (calc)

## 2018-03-19 ENCOUNTER — Other Ambulatory Visit: Payer: Self-pay

## 2018-03-19 NOTE — Patient Outreach (Signed)
[facility]) Care Management  03/19/2018  [name] 17-May-1945 [id number]   Telephone Screen  Referral Date:03/13/2018 Referral Source:MD Office Referral Reason:" pharmacist help-financial assistance for Trulicity" Insurance:[employer] Medicare   Outreach attempt #3 to patient. No answer at present.     Plan: RN CM will close case if no response from letter mailed to patient.   Antionette Fairy, RN,BSN,CCM [facility] Care Management Telephonic Care Management Coordinator Direct Phone: [phone] Toll Free: [phone] Fax: [phone]

## 2018-03-27 ENCOUNTER — Other Ambulatory Visit: Payer: Self-pay

## 2018-03-27 NOTE — Patient Outreach (Signed)
[facility]) Care Management  03/27/2018  [name] April 05, 1945 [id number]   Telephone Screen  Referral Date:03/13/2018 Referral Source:MD Office Referral Reason:" pharmacist help-financial assistance for Trulicity" Insurance:[employer] Medicare   Multiple attempts to establish contact with patient without success. No response from letter mailed to patient. Case is being closed at this time.     Plan: RN CM will close case at this time. RN CM will send MD case closure letter.  Antionette Fairy, RN,BSN,CCM [facility] Care Management Telephonic Care Management Coordinator Direct Phone: [phone] Toll Free: [phone] Fax: [phone]

## 2018-04-12 ENCOUNTER — Other Ambulatory Visit: Payer: Self-pay | Admitting: Family Medicine

## 2018-04-12 DIAGNOSIS — E113293 Type 2 diabetes mellitus with mild nonproliferative diabetic retinopathy without macular edema, bilateral: Secondary | ICD-10-CM

## 2018-04-13 ENCOUNTER — Other Ambulatory Visit: Payer: Self-pay | Admitting: Family Medicine

## 2018-04-13 DIAGNOSIS — E785 Hyperlipidemia, unspecified: Secondary | ICD-10-CM

## 2018-04-13 DIAGNOSIS — E1169 Type 2 diabetes mellitus with other specified complication: Secondary | ICD-10-CM

## 2018-04-13 DIAGNOSIS — E113293 Type 2 diabetes mellitus with mild nonproliferative diabetic retinopathy without macular edema, bilateral: Secondary | ICD-10-CM

## 2018-04-13 NOTE — Telephone Encounter (Signed)
Pt. Called requesting refill on  Glimepiride 4 mg, pravastatin   10mg . Pt  Call back # is [phone]

## 2018-04-14 ENCOUNTER — Ambulatory Visit: Payer: Medicare HMO | Admitting: Family Medicine

## 2018-04-14 MED ORDER — GLIMEPIRIDE 4 MG PO TABS: 4.0000 mg | ORAL_TABLET | Freq: Every day | ORAL | 0 refills | Status: DC

## 2018-04-14 MED ORDER — PRAVASTATIN SODIUM 10 MG PO TABS: 10.0000 mg | ORAL_TABLET | Freq: Every day | ORAL | 0 refills | Status: DC

## 2018-04-21 ENCOUNTER — Ambulatory Visit (INDEPENDENT_AMBULATORY_CARE_PROVIDER_SITE_OTHER): Payer: Medicare HMO | Admitting: Pharmacist

## 2018-04-21 DIAGNOSIS — E113293 Type 2 diabetes mellitus with mild nonproliferative diabetic retinopathy without macular edema, bilateral: Secondary | ICD-10-CM

## 2018-04-21 DIAGNOSIS — I10 Essential (primary) hypertension: Secondary | ICD-10-CM | POA: Diagnosis not present

## 2018-04-21 DIAGNOSIS — E785 Hyperlipidemia, unspecified: Secondary | ICD-10-CM | POA: Diagnosis not present

## 2018-04-21 DIAGNOSIS — IMO0002 Reserved for concepts with insufficient information to code with codable children: Secondary | ICD-10-CM

## 2018-04-21 DIAGNOSIS — E11319 Type 2 diabetes mellitus with unspecified diabetic retinopathy without macular edema: Secondary | ICD-10-CM

## 2018-04-21 DIAGNOSIS — E1165 Type 2 diabetes mellitus with hyperglycemia: Secondary | ICD-10-CM

## 2018-04-21 DIAGNOSIS — E1169 Type 2 diabetes mellitus with other specified complication: Secondary | ICD-10-CM | POA: Diagnosis not present

## 2018-04-21 NOTE — Chronic Care Management (AMB) (Signed)
Chronic Care Management   Note  04/21/2018 Name: [name] MRN: [id number] DOB: 08/25/1945   Subjective:   [name] is a 73 y.o. year old male who is a primary care patient of [name], DO. The CM team was consulted for assistance with chronic disease management and care coordination.   I reached out to [name] by phone today.   Mr. Peel was given information about Chronic Care Management services today including:  1. CCM service includes personalized support from designated clinical staff supervised by his physician, including individualized plan of care and coordination with other care providers 2. 24/7 contact phone numbers for assistance for urgent and routine care needs. 3. Service will only be billed when office clinical staff spend 20 minutes or more in a month to coordinate care. 4. Only one practitioner may furnish and bill the service in a calendar month. 5. The patient may stop CCM services at any time (effective at the end of the month) by phone call to the office staff. 6. The patient will be responsible for cost sharing (co-pay) of up to 20% of the service fee (after annual deductible is met).  Patient agreed to services and verbal consent obtained.   Review of patient status, including review of consultants reports, laboratory and other test data, was performed as part of comprehensive evaluation and provision of chronic care management services.   Objective: Lab Results  Component Value Date   CREATININE 1.17 05/29/2017   CREATININE 0.97 10/31/2015   CREATININE 0.95 05/10/2015    Lab Results  Component Value Date   HGBA1C 8.7 (H) 03/16/2018    Lipid Panel     Component Value Date/Time   CHOL 184 05/29/2017 0812   CHOL 204 (H) 05/10/2015 1609   TRIG 112 05/29/2017 0812   HDL 71 05/29/2017 0812   HDL 101 05/10/2015 1609   CHOLHDL 2.6 05/29/2017 0812   LDLCALC 92 05/29/2017 0812    BP Readings from Last 3 Encounters:   03/13/18 124/80  10/14/17 137/66  09/29/17 130/80    Allergies  Allergen Reactions  . 2,4-D Dimethylamine (Amisol) Other (See Comments)  . Tetanus Toxoid Nausea And Vomiting  . Tetanus-Diphtheria Toxoids Td Other (See Comments)    Medications Reviewed Today    Reviewed by [name], [place] (Pharmacist) on 04/21/18 at [place] List Status: <None>  Medication Order Taking? Sig Documenting Provider Last Dose Status Informant  Dulaglutide (TRULICITY) 1.5 MW/4.1LK SOPN [id number] Yes Inject 1.5 mg into the skin once a week. [name], DO Taking Active            Med Note [name], [name]   Tue Apr 21, 2018  2:49 PM) On Mondays  glimepiride (AMARYL) 4 MG tablet [id number] Yes Take 1 tablet (4 mg total) by mouth daily with breakfast. [name], DO Taking Active   glucose blood (ONE TOUCH ULTRA TEST) test strip [id number]  TEST TWICE DAILY [name], DO  Active   lisinopril-hydrochlorothiazide (PRINZIDE,ZESTORETIC) 20-12.5 MG tablet [id number] Yes Take 1 tablet by mouth daily. [name], DO Taking Active   metFORMIN (GLUCOPHAGE) 1000 MG tablet [id number] Yes Take 1 tablet (1,000 mg total) by mouth 2 (two) times daily with a meal. [name], DO Taking Active   [facility] LANCETS 01S MISC [id number]  1 each by Does not apply route 3 (three) times daily. [name], DO  Active   pravastatin (PRAVACHOL) 10 MG tablet [id number] Yes  Take 1 tablet (10 mg total) by mouth daily. [name], DO Taking Active   selenium sulfide (SELSUN) 2.5 % shampoo [id number] No Apply topically daily as needed for irritation.  Patient not taking:  Reported on 04/21/2018   [name], NP Not Taking Active   tadalafil (ADCIRCA/CIALIS) 20 MG tablet [id number] Yes Take 1 tablet (20 mg total) by mouth daily as needed for erectile dysfunction. [name], MD Taking Active            Assessment:    Goals Addressed            This Visit's Progress   . "I want to get my A1C down" (pt-stated)       Current Barriers:  [name] Medication Adherence  o Reports occasionally missing evening doses of metformin or taking twice as much in the morning due to fear of missing evening dose. o Reports not currently taking glimepiride 4 mg once daily as directed. - Reports continuing to take glimpiride 4 mg - 2 tablets (8 mg total) daily even after resuming Trulicity . Financial Barriers . Knowledge deficits related to diet  Pharmacist Clinical Goal(s):  [name] Over the next 30 days, patient will work with PharmD to address needs related to address needs related to diabetes management.  Interventions: . Comprehensive medication review performed. . Advised patient: o To use weekly pillbox and medication alarms to improve adherence o On signs of low blood sugars and how to treat low blood sugars - Patient reports two low blood sugars (each around 53 mg/dL) last week, each occurring in the afternoon after work.  o About the importance of eating regular meals  o To carry a quick-acting 15-gram source of sugar (3-4 butterscotch candies) with him, particularly when at work o Take his metformin and glimepiride as directed. . Will collaborate with [facility] CPhT to assist patient with patient assistance application for Trulicity . Provide patient with contact information for [employer] OTC benefit for cost savings . Discussed plans with patient for ongoing care management follow up and provided patient with direct contact information for care management team . 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  Patient Self Care Activities:  . Self administers medications as prescribed . Checks blood sugars regularly and keeps log . Pick up pravastatin prescription from pharmacy . Calls provider office for new concerns or  questions, particularly if he has future low blood sugars.  Initial goal documentation        Plan:  1) Telephone follow up appointment with CCM team member scheduled for: Monday, 04/27/2018 at 2 pm  2) I will route patient assistance letter to [place] technician who will coordinate patient assistance program application process for medications listed above. [facility] pharmacy technician will assist with obtaining all required documents from both patient and provider and submit application once completed.    Harlow Asa, PharmD, [place] [employer] [phone]

## 2018-04-21 NOTE — Patient Instructions (Signed)
Visit Information  Goals Addressed            This Visit's Progress   . "I want to get my A1C down" (pt-stated)       Current Barriers:  [name] Medication Adherence  o Reports occasionally missing evening doses of metformin or taking twice as much in the morning due to fear of missing evening dose. o Reports not currently taking glimepiride 4 mg once daily as directed. - Reports continuing to take glimpiride 4 mg - 2 tablets (8 mg total) daily even after resuming Trulicity . Financial Barriers . Knowledge deficits related to diet  Pharmacist Clinical Goal(s):  [name] Over the next 30 days, patient will work with PharmD to address needs related to address needs related to diabetes management.  Interventions: . Comprehensive medication review performed. . Advised patient: o To use weekly pillbox and medication alarms to improve adherence o On signs of low blood sugars and how to treat low blood sugars - Patient reports two low blood sugars (each around 53 mg/dL) last week, each occurring in the afternoon after work.  o About the importance of eating regular meals  o To carry a quick-acting 15-gram source of sugar (3-4 butterscotch candies) with him, particularly when at work o Take his metformin and glimepiride as directed. . Will collaborate with [facility] CPhT to assist patient with patient assistance application for Trulicity . Provide patient with contact information for [employer] OTC benefit for cost savings . Discussed plans with patient for ongoing care management follow up and provided patient with direct contact information for care management team . 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  Patient Self Care Activities:  . Self administers medications as prescribed . Checks blood sugars regularly and keeps log . Pick up pravastatin prescription from pharmacy . Calls provider office for new  concerns or questions, particularly if he has future low blood sugars.  Initial goal documentation        Mr. Holliman was given information about Chronic Care Management services today including:  1. CCM service includes personalized support from designated clinical staff supervised by his physician, including individualized plan of care and coordination with other care providers 2. 24/7 contact phone numbers for assistance for urgent and routine care needs. 3. Service will only be billed when office clinical staff spend 20 minutes or more in a month to coordinate care. 4. Only one practitioner may furnish and bill the service in a calendar month. 5. The patient may stop CCM services at any time (effective at the end of the month) by phone call to the office staff. 6. The patient will be responsible for cost sharing (co-pay) of up to 20% of the service fee (after annual deductible is met).  Patient agreed to services and verbal consent obtained.   The patient verbalized understanding of instructions provided today and declined a print copy of patient instruction materials.   Telephone follow up appointment with CCM team member scheduled for: 04/27/2018  Harlow Asa, PharmD, [facility] [phone]

## 2018-04-23 ENCOUNTER — Other Ambulatory Visit: Payer: Self-pay | Admitting: Pharmacy Technician

## 2018-04-23 NOTE — Patient Outreach (Signed)
[facility]) Care Management  04/23/2018  [name] 15-Jul-1945 [id number]                           Medication Assistance Referral  Referral From: [facility] RPh Vallery Sa [facility] RPh)  Medication/Company: [name] / [name] Patient application portion:  Mailed Provider application portion: Faxed  to Dr. Althea Charon    Follow up:  Will follow up with patient in 5-10 business days to confirm application(s) have been received.  [name] P. Teagan Heidrick, CPhT [facility] Care Management [phone]

## 2018-04-27 ENCOUNTER — Ambulatory Visit: Payer: Self-pay | Admitting: Pharmacist

## 2018-04-27 DIAGNOSIS — E785 Hyperlipidemia, unspecified: Secondary | ICD-10-CM

## 2018-04-27 DIAGNOSIS — I10 Essential (primary) hypertension: Secondary | ICD-10-CM

## 2018-04-27 DIAGNOSIS — E1169 Type 2 diabetes mellitus with other specified complication: Secondary | ICD-10-CM

## 2018-04-27 DIAGNOSIS — E1142 Type 2 diabetes mellitus with diabetic polyneuropathy: Secondary | ICD-10-CM

## 2018-04-27 DIAGNOSIS — IMO0002 Reserved for concepts with insufficient information to code with codable children: Secondary | ICD-10-CM

## 2018-04-27 DIAGNOSIS — E1165 Type 2 diabetes mellitus with hyperglycemia: Principal | ICD-10-CM

## 2018-04-27 NOTE — Chronic Care Management (AMB) (Signed)
Chronic Care Management   Follow Up Note   04/27/2018 Name: Clydell Mahannah MRN: [id number] DOB: Sep 13, 1945  Referred by: [name], DO Reason for referral : Chronic Care Management (Patient Telephone Outreach)   Lakhi Dils is a 73 y.o. year old male who is a primary care patient of [name], DO. The CCM team was consulted for assistance with chronic disease management and care coordination needs.    I reached out to [name] by phone today.   Review of patient status, including review of consultants reports, relevant laboratory and other test results, and collaboration with appropriate care team members and the patient's provider was performed as part of comprehensive patient evaluation and provision of chronic care management services.    Goals Addressed            This Visit's Progress   . "I want to get my A1C down" (pt-stated)       Current Barriers:  [name] Medication Adherence  o Reports occasionally missing evening doses of metformin or taking twice as much in the morning due to fear of missing evening dose. o Reports not currently taking glimepiride 4 mg once daily as directed. - Reports that he believes that he had been taking the glimepiride 4mg  once daily as directed following our last conversation and then forgot and started back to taking the 2 tablets (8 mg) once daily again . Financial Barriers . Knowledge deficits related to diet  Pharmacist Clinical Goal(s):  [name] Over the next 30 days, patient will work with PharmD to address needs related to address needs related to diabetes management.   Over the next 30 days, patient will demonstrate Improved medication adherence as evidenced by patient report of adherence  Interventions: . Comprehensive medication review performed. o Patient has not yet picked up his pravastatin refill from the pharmacy . Counseled patient on the importance of medication adherence . Advised patient: o To use  weekly pillbox and medication alarms to improve adherence o Take his metformin and glimepiride as directed. o As he writes himself a note with his glimepiride to remind him to take it only as directed.  Counseled patient on management of low blood sugars and the importance of eating regular meals  o Importance of eating before starting work.  [name] Collaborate with [facility] to assist patient with patient assistance application for Trulicity o Application mailed by [facility] on 04/23/18. Patient reports not having received this yet. o Advised patient to check mail again today. . Again provide patient with contact information for [employer] OTC benefit for cost savings . Discussed plans with patient for ongoing care management follow up and provided patient with direct contact information for care management team  Patient Self Care Activities:  . Currently unable to consistently self administers all medications as prescribed . Checks blood sugars and keeps log Date Fasting Morning  2 hours after Lunch 2 hours After Supper  31- March 71    1- April 125    2- April  140   3- April     4- April     5- April   87  6- April 65     . Calls provider office for new concerns or questions, particularly if he has future low blood sugars. . Patient to eat regular meals o Start having peanut butter crackers each morning before work.   Please see past updates related to this goal by clicking on the "Past Updates" button in the selected  goal         Plan   Telephone follow up appointment with CCM team member scheduled for: Wednesday, 04/29/2018 at 3 pm  Patient to take glimepiride 4 mg -1 tablet once daily each morning as directed.   Patient to start having peanut butter crackers each morning before work in order to have a morning meal.  [facility]  CCM Nurse Care Manager follow up with patient in the next 7 days.  Duanne MoronElisabeth Estevan Kersh,  PharmD, [facility] Clinical Pharmacist [facility] [phone]

## 2018-04-27 NOTE — Patient Instructions (Signed)
Visit Information  Goals Addressed            This Visit's Progress   . "I want to get my A1C down" (pt-stated)       Current Barriers:  [name] Medication Adherence  o Reports occasionally missing evening doses of metformin or taking twice as much in the morning due to fear of missing evening dose. o Reports not currently taking glimepiride 4 mg once daily as directed. - Reports that he believes that he had been taking the glimepiride 4mg  once daily as directed following our last conversation and then forgot and started back to taking the 2 tablets (8 mg) once daily again . Financial Barriers . Knowledge deficits related to diet  Pharmacist Clinical Goal(s):  [name] Over the next 30 days, patient will work with PharmD to address needs related to address needs related to diabetes management.   Over the next 30 days, patient will demonstrate Improved medication adherence as evidenced by patient report of adherence  Interventions: . Comprehensive medication review performed. o Patient has not yet picked up his pravastatin refill from the pharmacy . Counseled patient on the importance of medication adherence . Advised patient: o To use weekly pillbox and medication alarms to improve adherence o Take his metformin and glimepiride as directed. o As he writes himself a note with his glimepiride to remind him to take it only as directed.  Counseled patient on management of low blood sugars and the importance of eating regular meals  o Importance of eating before starting work.  [name] Collaborate with [facility] to assist patient with patient assistance application for Trulicity o Application mailed by [facility] on 04/23/18. Patient reports not having received this yet. o Advised patient to check mail again today. . Again provide patient with contact information for [employer] OTC benefit for cost savings . Discussed plans with patient for ongoing care management follow up and provided patient with direct  contact information for care management team  Patient Self Care Activities:  . Currently unable to consistently self administers all medications as prescribed . Checks blood sugars and keeps log Date Fasting Morning  2 hours after Lunch 2 hours After Supper  31- March 71    1- April 125    2- April  140   3- April     4- April     5- April   87  6- April 65     . Calls provider office for new concerns or questions, particularly if he has future low blood sugars. . Patient to eat regular meals o Start having peanut butter crackers each morning before work.   Please see past updates related to this goal by clicking on the "Past Updates" button in the selected goal         The patient verbalized understanding of instructions provided today and declined a print copy of patient instruction materials.   Telephone follow up appointment with CCM team member scheduled for: 04/29/2018  [name], PharmD, [facility] Clinical Pharmacist [facility] [phone]

## 2018-04-29 ENCOUNTER — Ambulatory Visit: Payer: Self-pay | Admitting: Pharmacist

## 2018-04-29 ENCOUNTER — Telehealth: Payer: Self-pay | Admitting: Family Medicine

## 2018-04-29 DIAGNOSIS — E1165 Type 2 diabetes mellitus with hyperglycemia: Principal | ICD-10-CM

## 2018-04-29 DIAGNOSIS — E1169 Type 2 diabetes mellitus with other specified complication: Secondary | ICD-10-CM

## 2018-04-29 DIAGNOSIS — E1142 Type 2 diabetes mellitus with diabetic polyneuropathy: Secondary | ICD-10-CM

## 2018-04-29 DIAGNOSIS — IMO0002 Reserved for concepts with insufficient information to code with codable children: Secondary | ICD-10-CM

## 2018-04-29 DIAGNOSIS — E785 Hyperlipidemia, unspecified: Secondary | ICD-10-CM

## 2018-04-29 NOTE — Telephone Encounter (Signed)
Patient asked question to [name] [facility] to request that he could speak to me regarding his work situation - he is asking if he is high risk and if he needs to stop working due to [employer].  Could you please call patient to clarify what his concerns and questions are? And if he wants to have longer discussion with me and possibly consider any work restrictions - then he should be scheduled for a telephone virtual visit Thursday or Friday.  If he just has brief questions - but does not want any work restrictions - then we do not need a Virtual Visit - it can just be telephone note in chart.  Saralyn Pilar, DO [facility] 04/29/2018, 4:54 PM

## 2018-04-29 NOTE — Patient Instructions (Signed)
Visit Information  Goals Addressed            This Visit's Progress   . "I want to get my A1C down" (pt-stated)       Current Barriers:  [name] Medication Adherence  - Reports now taking medications, including glimepiride, metformin and pravastatin as directed. - Reports having a weekly pillbox, but has not yet set it up. . Financial Barriers . Knowledge deficits related to diet  Pharmacist Clinical Goal(s):  [name] Over the next 30 days, patient will work with PharmD to address needs related to address needs related to diabetes management.   Over the next 30 days, patient will demonstrate Improved medication adherence as evidenced by patient report of adherence  Interventions: [name] Medication review performed o Patient confirms that he has picked up his pravastatin yesterday and resumed taking it.  o Reports that he injected his Trulicity on Monday as planned, but wasted a dose due to an administration error. . Counsel patient on the importance of medication adherence o Advise patient to use weekly pillbox improve adherence   Counseled patient on the importance of eating regular meals  . Collaborate with [facility] to assist patient with patient assistance application for Trulicity o Patient confirms that he received assistance application in the mail and is working on completing it. . Counsel patient again about COVID-19 prevention, including handwashing, social distancing and face making recommended by CDC. 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 - Collaborate with patient's PCP for his recommendation for patient.  Patient Self Care Activities:  . Self administers all medications as prescribed . Checks blood sugars and keeps log:  Date Morning Fasting After Lunch  7-April 107  132  8- April  119        o Denies any signs/symptoms of  low blood sugars since last call. . Calls provider office for new concerns or questions, particularly if he has future low blood sugars. . Patient to setup medication pillbox and start filling weekly as medication adherence tool . Patient to eat regular meals o Consistently eat breakfast each morning before work.   Please see past updates related to this goal by clicking on the "Past Updates" button in the selected goal         The patient verbalized understanding of instructions provided today and declined a print copy of patient instruction materials.   The CM team will reach out to the patient again over the next 7 days.   [name], PharmD, [facility] Clinical Pharmacist [facility] [employer] [phone]

## 2018-04-29 NOTE — Chronic Care Management (AMB) (Signed)
  Chronic Care Management   Follow Up Note   04/29/2018 Name: [name] MRN: [id number] DOB: [birth year]  Referred by: [name], DO Reason for referral : Chronic Care Management (Patient Telephone Outreach)   [name] is a 72 y.o. year old male who is a primary care patient of [name], DO. The CCM team was consulted for assistance with chronic disease management and care coordination needs.    I reached out to [name] by phone today for follow up regarding medication adherence and medication assistance.   Review of patient status, including review of consultants reports, relevant laboratory and other test results, and collaboration with appropriate care team members and the patient's provider was performed as part of comprehensive patient evaluation and provision of chronic care management services.    Goals Addressed            This Visit's Progress   . "I want to get my A1C down" (pt-stated)       Current Barriers:  [name] Medication Adherence  - Reports now taking medications, including glimepiride, metformin and pravastatin as directed. - Reports having a weekly pillbox, but has not yet set it up. . Financial Barriers . Knowledge deficits related to diet  Pharmacist Clinical Goal(s):  [name] Over the next 30 days, patient will work with PharmD to address needs related to address needs related to diabetes management.   Over the next 30 days, patient will demonstrate Improved medication adherence as evidenced by patient report of adherence  Interventions: [name] Medication review performed o Patient confirms that he has picked up his pravastatin yesterday and resumed taking it.  o Reports that he injected his Trulicity on Monday as planned, but wasted a dose due to an administration error. . Counsel patient on the importance of medication adherence o Advise patient to use weekly pillbox improve adherence   Counseled patient on the importance of  eating regular meals  . Collaborate with [facility] to assist patient with patient assistance application for Trulicity o Patient confirms that he received assistance application in the mail and is working on completing it. . Counsel patient again about COVID-19 prevention, including handwashing, social distancing and face making recommended by CDC. 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 - Collaborate with patient's PCP for his recommendation for patient.  Patient Self Care Activities:  . Self administers all medications as prescribed . Checks blood sugars and keeps log:  Date Morning Fasting After Lunch  7-April 107  132  8- April  119    o Denies any signs/symptoms of low blood sugars since last call. . Calls provider office for new concerns or questions, particularly if he has future low blood sugars. . Patient to setup medication pillbox and start filling weekly as medication adherence tool . Patient to eat regular meals o Consistently eat breakfast each morning before work.   Please see past updates related to this goal by clicking on the "Past Updates" button in the selected goal         Plan  The CM team will reach out to the patient again over the next 7 days.   [name], PharmD, [facility] Clinical Pharmacist [facility] [employer] [phone]

## 2018-04-30 NOTE — Telephone Encounter (Signed)
Patient advised to either fax or drop off the paperwork and he has appointment scheduled for tomorrow 05/01/18  virtual visit. patient states that "he wants FMLA to filled out due to health condition regarding his HTN and diabetes and not safe for him to work. 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 "

## 2018-04-30 NOTE — Telephone Encounter (Signed)
Left message

## 2018-05-01 ENCOUNTER — Encounter: Payer: Self-pay | Admitting: Family Medicine

## 2018-05-01 ENCOUNTER — Other Ambulatory Visit: Payer: Self-pay

## 2018-05-01 ENCOUNTER — Ambulatory Visit (INDEPENDENT_AMBULATORY_CARE_PROVIDER_SITE_OTHER): Payer: Medicare HMO | Admitting: Family Medicine

## 2018-05-01 DIAGNOSIS — I10 Essential (primary) hypertension: Secondary | ICD-10-CM

## 2018-05-01 DIAGNOSIS — E113293 Type 2 diabetes mellitus with mild nonproliferative diabetic retinopathy without macular edema, bilateral: Secondary | ICD-10-CM | POA: Diagnosis not present

## 2018-05-01 NOTE — Assessment & Plan Note (Addendum)
Previously A1c above goal at 8.7, now back on medicine, w/ financial assistance pending Previously well controlled on meds, now off meds for >2 months due to financial barrier Complications - DM retinopathy and Hyperlipidemia - increases risk of vascular complication  Plan: 1. Continue Trulicity 5.6YB weekly Maxwell injection - sample x 4 pen given for 1 month supply - again today, as he is pending financial assistance application as of last contact with CCM pharmacy 04/29/18 - patient was notified and he came to office met our staff in parking lot to receive the samples - Continue Metformin 1048m BID - May REDUCE Glimepiride down to only 1 pill or 485mdaily with meal and taper down in future 2. Encourage improved lifestyle - low carb, low sugar diet, reduce portion size, start regular exercise 3. Check CBG, bring log to next visit for review 4. Continue ASA, ACEi, resumed Statin

## 2018-05-01 NOTE — Patient Instructions (Signed)
We will complete your paperwork as soon as we can and submit it by next week.  If you have any other questions or concerns, please feel free to call the office or send a message through MyChart. You may also schedule an earlier appointment if necessary.  Additionally, you may be receiving a survey about your experience at our office within a few days to 1 week by e-mail or mail. We value your feedback.  Saralyn Pilar, DO [facility], [place]

## 2018-05-01 NOTE — Addendum Note (Signed)
Addended by: [name] on: 05/01/2018 12:14 PM   Modules accepted: Level of Service

## 2018-05-01 NOTE — Assessment & Plan Note (Signed)
Controlled BP - Home BP readings infrequent but improved No known complications    Plan:  1. Continue current BP regimen - Lisinopril-HCTZ 20-12.5mg  daily 2. Encourage improved lifestyle - low sodium diet,  Improve exercise 3. Continue monitor BP outside office, bring readings to next visit, if persistently >140/90 or new symptoms notify office sooner

## 2018-05-01 NOTE — Progress Notes (Addendum)
Virtual Visit via Telephone The purpose of this virtual visit is to provide medical care while limiting exposure to the novel coronavirus (COVID19) for both patient and office staff.  Consent was obtained for phone visit:  Yes. Answered questions that patient had about telehealth interaction:  Yes.   I discussed the limitations, risks, security and privacy concerns of performing an evaluation and management service by telephone. I also discussed with the patient that there may be a patient responsible charge related to this service. The patient expressed understanding and agreed to proceed.  Patient Location: Home Provider Location: [facility])  ---------------------------------------------------------------------- Chief Complaint  Patient presents with  . Diabetes  . Hypertension    S: Reviewed CMA telephone note below. I have called patient and gathered additional HPI as follows:  CCM Pharmacy  CHRONIC DM, Type 2w/ Retinopathy - Last visit with me for same problem 02/2018, treated with restarted Trulicity GLP1, and referral to Case Management / Pharmacy for financial assistance, due to prior med non adherence due to financial, see prior notes for background information. - Interval he is still waiting on completing the Financial Assistance Application for Trulicity - as per CCM - Today patient reports no new concerns. He is doing well back on Trulicity, able to get assistance now - not yet due for new A1c CBGs:Avg improved 120-140,Low >90 (asymptomatic), High< 170. Checks CBGsmost days Meds: - Trulicity 1.'5mg'$  weekly injection - he did mess up one dosage admin error, now out early, request a sample - Metformin '1000mg'$  BID - Taking Glimepiride '4mg'$  daily w/ breakfast Reports good compliance. Tolerating well w/o side-effects Currently on ACEi Lifestyle: - Diet (Trying to improve diet but not always able to adhere) - Exercise (Limited exercise due to time-  gradually improving) - Dr [name] last DM Eye exam5/2019 w/ DM retinopathy, requiring monitoring Denies hypoglycemia, polyuria, visual changes, numbness or tingling.  CHRONIC HTN: Reports no new concerns. Seems well controlled. Occasionally has flare up elevated BP and headache. Current Meds - Lisinopril-HCTZ 20-12.'5mg'$  once daily   Reports good compliance, took meds today. Tolerating well, w/o complaints. Denies CP, dyspnea, HA, edema, dizziness / lightheadedness   Patient is currently working for [employer] - he is asking about FMLA for routine intermittent leave to be renew his previous FMLA was from 2019 - from different company now.  Denies any high risk travel to areas of current concern for COVID19. Denies any known or suspected exposure to person with or possibly with COVID19.  Denies any fevers, chills, sweats, body ache, cough, shortness of breath, sinus pain or pressure, headache, abdominal pain, diarrhea  Past Medical History:  Diagnosis Date  . Erectile dysfunction   . Right-sided chest pain    Social History   Tobacco Use  . Smoking status: Current Some Day Smoker    Types: Cigarettes  . Smokeless tobacco: Current User  . Tobacco comment: pack of cigarettes last 1-2 months - doesnt inhale  Substance Use Topics  . Alcohol use: Yes    Alcohol/week: 1.0 standard drinks    Types: 1 Cans of beer per week    Comment: 1-2 beers week   . Drug use: No    Current Outpatient Medications:  .  Dulaglutide (TRULICITY) 1.5 ZO/1.0RU SOPN, Inject 1.5 mg into the skin once a week., Disp: 4 pen, Rfl: 0 .  glimepiride (AMARYL) 4 MG tablet, Take 1 tablet (4 mg total) by mouth daily with breakfast., Disp: 90 tablet, Rfl: 0 .  glucose  blood (ONE TOUCH ULTRA TEST) test strip, TEST TWICE DAILY, Disp: 100 each, Rfl: 5 .  lisinopril-hydrochlorothiazide (PRINZIDE,ZESTORETIC) 20-12.5 MG tablet, Take 1 tablet by mouth daily., Disp: 90 tablet, Rfl: 3 .  metFORMIN (GLUCOPHAGE) 1000 MG  tablet, Take 1 tablet (1,000 mg total) by mouth 2 (two) times daily with a meal., Disp: 180 tablet, Rfl: 3 .  ONETOUCH DELICA LANCETS 95A MISC, 1 each by Does not apply route 3 (three) times daily., Disp: 100 each, Rfl: 11 .  pravastatin (PRAVACHOL) 10 MG tablet, Take 1 tablet (10 mg total) by mouth daily., Disp: 90 tablet, Rfl: 0 .  selenium sulfide (SELSUN) 2.5 % shampoo, Apply topically daily as needed for irritation., Disp: 118 mL, Rfl: 11 .  tadalafil (ADCIRCA/CIALIS) 20 MG tablet, Take 1 tablet (20 mg total) by mouth daily as needed for erectile dysfunction., Disp: 30 tablet, Rfl: 0  Depression screen [facility] 2/9 05/01/2018 10/14/2017 08/05/2017  Decreased Interest 0 0 0  Down, Depressed, Hopeless 0 0 0  PHQ - 2 Score 0 0 0  Altered sleeping - - -  Tired, decreased energy - - -  Change in appetite - - -  Feeling bad or failure about yourself  - - -  Trouble concentrating - - -  Moving slowly or fidgety/restless - - -  Suicidal thoughts - - -  PHQ-9 Score - - -  Difficult doing work/chores - - -    No flowsheet data found.  -------------------------------------------------------------------------- O: No physical exam performed due to remote telephone encounter.  Lab results reviewed.  Recent Labs    05/29/17 0812 10/14/17 1457 03/16/18 1137  HGBA1C 6.7* 6.5* 8.7*     Recent Results (from the past 2160 hour(s))  Hemoglobin A1c     Status: Abnormal   Collection Time: 03/16/18 11:37 AM  Result Value Ref Range   Hgb A1c MFr Bld 8.7 (H) <5.7 % of total Hgb    Comment: For someone without known diabetes, a hemoglobin A1c value of 6.5% or greater indicates that they may have  diabetes and this should be confirmed with a follow-up  test. . For someone with known diabetes, a value <7% indicates  that their diabetes is well controlled and a value  greater than or equal to 7% indicates suboptimal  control. A1c targets should be individualized based on  duration of diabetes, age,  comorbid conditions, and  other considerations. . Currently, no consensus exists regarding use of hemoglobin A1c for diagnosis of diabetes for children. .    Mean Plasma Glucose 203 (calc)   eAG (mmol/L) 11.2 (calc)    -------------------------------------------------------------------------- A&P:  Problem List Items Addressed This Visit    Controlled type 2 diabetes mellitus with retinopathy ([place]) - Primary    Previously A1c above goal at 8.7, now back on medicine, w/ financial assistance pending Previously well controlled on meds, now off meds for >2 months due to financial barrier Complications - DM retinopathy and Hyperlipidemia - increases risk of vascular complication  Plan: 1. Continue Trulicity 2.1HY weekly [place] injection - sample x 4 pen given for 1 month supply - again today, as he is pending financial assistance application as of last contact with CCM pharmacy 04/29/18 - patient was notified and he came to office met our staff in parking lot to receive the samples - Continue Metformin 1048m BID - May REDUCE Glimepiride down to only 1 pill or 472mdaily with meal and taper down in future 2. Encourage improved lifestyle - low carb,  low sugar diet, reduce portion size, start regular exercise 3. Check CBG, bring log to next visit for review 4. Continue ASA, ACEi, resumed Statin      Essential hypertension    Controlled BP - Home BP readings infrequent but improved No known complications    Plan:  1. Continue current BP regimen - Lisinopril-HCTZ 20-12.67m daily 2. Encourage improved lifestyle - low sodium diet,  Improve exercise 3. Continue monitor BP outside office, bring readings to next visit, if persistently >140/90 or new symptoms notify office sooner         Patient requested FMLA but did not provide [place] forms or any means to complete this, this is a recert with new company of his previous FMLA for intermittent leave PRN with his chronic health conditions and  for doctors appointments. Our office has notified [place] awaiting paperwork, will complete when received within 5 days - and then notify patient when completed, may take up to 1-2 weeks turn around for this process since we do not have forms yet. - anticipate - approx 1 flare up per 3 months, 1-2 days per flare up - HA HTN, Hyperglycemia. Also will request up to 6 doctors visit apt time off per year, 1 day per apt  No orders of the defined types were placed in this encounter.   Follow-up: - Return in 4 weeks for DM as scheduled  Patient verbalizes understanding with the above medical recommendations including the limitation of remote medical advice.  Specific follow-up and call-back criteria were given for patient to follow-up or seek medical care more urgently if needed.   - Time spent in direct consultation with patient on phone: 11 minutes  ANobie Putnam [facility] 05/01/2018, 8:12 AM

## 2018-05-04 ENCOUNTER — Other Ambulatory Visit: Payer: Self-pay | Admitting: Pharmacy Technician

## 2018-05-04 ENCOUNTER — Ambulatory Visit: Payer: Self-pay | Admitting: *Deleted

## 2018-05-04 NOTE — Chronic Care Management (AMB) (Addendum)
  Chronic Care Management   Note  05/04/2018 Name: [name] MRN: [id number] DOB: 1945/08/09  Placed a call to [name] patient of [name], DO today to discuss goals for managing their chronic condition of Diabetes. Unable to reach patient at this time, was able to leave a HIPPA compliant voice mail and requested a return call.   Follow up plan: The CM team will reach out to the patient again over the next 7 days.   Ma Rings Tamir Wallman RN, BSN Nurse Case Education officer, community Family Practice/[facility] Care Management  [phone]) Business Mobile

## 2018-05-04 NOTE — Patient Outreach (Signed)
[facility]) Care Management  05/04/2018  [name] Oct 14, 1945 [id number]   Unsuccessful outreach attempt placed to patient in regards to [name] application for Trulicity.  Unfortunately patient did not answer the phone. A voicemail box never picked up so a message was unable to be left. The phone rang between 10-15 times with no answer.  Was calling patient to inquire if he had received the application that was mailed on 04/23/2018.  Will followup with 2nd outreach attempt in 3-5 business days.  Daniella Dewberry P. Castella Lerner, CPhT Musician Care Management [phone]

## 2018-05-06 ENCOUNTER — Ambulatory Visit: Payer: Self-pay | Admitting: Pharmacist

## 2018-05-06 DIAGNOSIS — E785 Hyperlipidemia, unspecified: Secondary | ICD-10-CM

## 2018-05-06 DIAGNOSIS — E1169 Type 2 diabetes mellitus with other specified complication: Secondary | ICD-10-CM

## 2018-05-06 DIAGNOSIS — E1165 Type 2 diabetes mellitus with hyperglycemia: Principal | ICD-10-CM

## 2018-05-06 DIAGNOSIS — E1142 Type 2 diabetes mellitus with diabetic polyneuropathy: Secondary | ICD-10-CM

## 2018-05-06 DIAGNOSIS — IMO0002 Reserved for concepts with insufficient information to code with codable children: Secondary | ICD-10-CM

## 2018-05-06 NOTE — Chronic Care Management (AMB) (Signed)
Chronic Care Management   Follow Up Note   05/06/2018 Name: [name] MRN: [id number] DOB: Dec 31, 1945  Referred by: [name], Alexander J, DO Reason for referral : Chronic Care Management (Patient Phone Call)   Christopher PorterRobert Presswood is a 73 y.o. year old male who is a primary care patient of [name], Alexander J, DO. The CCM team was consulted for assistance with chronic disease management and care coordination needs.    I reached out to [name] by phone today to follow up regarding medication adherence, medication managment and diabetes education.  Review of patient status, including review of consultants reports, relevant laboratory and other test results, and collaboration with appropriate care team members and the patient's provider was performed as part of comprehensive patient evaluation and provision of chronic care management services.    Goals Addressed            This Visit's Progress   . "I want to get my A1C down" (pt-stated)       Current Barriers:  [name]. Medication Adherence  - Reports taking medications as directed. - Weekly pillbox not setup . Financial Barriers . Knowledge deficits related to diet  Pharmacist Clinical Goal(s):  [name]. Over the next 30 days, patient will work with PharmD to address needs related to address needs related to diabetes management.   Over the next 30 days, patient will demonstrate Improved medication adherence as evidenced by patient report of adherence  Interventions: [name]. Medication review performed o Reports that he injected his Trulicity on Monday as planned without issue o Identify that patient is in need of a refill of his glimepiride o Identify that patient is in need of test strips, only #10 remaining . Collaborate with [facility]'s [facility] to have glimepiride refilled . Collaborate with [facility]'s [facility] Medicare insurance plan to confirm covered meters o Per Judeth CornfieldStephanie with [facility] customer service, Accu-Chek Aviva and  Accu-Chek Guide meters and supplies are $0 copayment through Part B coverage at [employer] mail order AND local standard network pharmacies . Counsel patient on the importance of medication adherence o Advise patient to use weekly pillbox improve adherence   Counsel patient on importance of checking his blood sugar regularly and keeping log  Counseled patient on the importance of eating regular meals  . Collaborate with [facility] C[facility]T to assist patient with patient assistance application for Trulicity  Patient Self Care Activities:  . Self administers all medications as prescribed . Currently unable to blood sugars daily and keep log: Date Fasting Blood Glucose Before Supper Comments  9 - April 91    10 - April     11 - April     12 - April     13 - April     14 - April  62* *Felt jittery & treated low  15 - April 113     Reports that he forgot to check his blood sugar from 4/10 to 4/13 . Calls provider office for new concerns or questions, particularly if he has future low blood sugars. . Patient to setup medication pillbox and start filling weekly as medication adherence tool . Patient to eat regular meals . [facility]   Please see past updates related to this goal by clicking on the "Past Updates" button in the selected goal         PLAN  1) Patient to pick up glimepiride prescription from pharmacy tomorrow. 2) Will send InBasket message to PCP requesting new  prescription for covered glucometer be sent to patient's [facility]. 3) Telephone follow up appointment with CCM Pharmacist scheduled for: 05/07/18 at 2 pm to follow up regarding blood sugars, testing supplies, medication adherence and medication assistance.  [name], PharmD, [facility] Clinical Pharmacist [facility] [employer] [phone]

## 2018-05-06 NOTE — Patient Instructions (Signed)
Thank you allowing the Chronic Care Management Team to be a part of your care! It was a pleasure speaking with you today!     CCM (Chronic Care Management) Team    Janci Minor RN, BSN Nurse Care Coordinator  [phone]   [name] PharmD  Clinical Pharmacist  [phone]   Dickie La LCSW Clinical Social Worker [phone]  Visit Information  Goals Addressed            This Visit's Progress   . "I want to get my A1C down" (pt-stated)       Current Barriers:  [name] Medication Adherence  - Reports taking medications as directed. - Weekly pillbox not setup . Financial Barriers . Knowledge deficits related to diet  Pharmacist Clinical Goal(s):  [name] Over the next 30 days, patient will work with PharmD to address needs related to address needs related to diabetes management.   Over the next 30 days, patient will demonstrate Improved medication adherence as evidenced by patient report of adherence  Interventions: [name] Medication review performed o Reports that he injected his Trulicity on Monday as planned without issue o Identify that patient is in need of a refill of his glimepiride o Identify that patient is in need of test strips, only #10 remaining . Collaborate with [facility]'s [facility] to have glimepiride refilled . Collaborate with patient's [facility] insurance plan to confirm covered meters o Per Judeth Cornfield with [facility] customer service, Accu-Chek Aviva and Accu-Chek Guide meters and supplies are $0 copayment through Part B coverage at [employer] order AND local standard network pharmacies . Counsel patient on the importance of medication adherence o Advise patient to use weekly pillbox improve adherence   Counsel patient on importance of checking his blood sugar regularly and keeping log  Counseled patient on the importance of eating regular meals  . Collaborate with [facility] C[facility] to assist patient with patient assistance application for  Trulicity  Patient Self Care Activities:  . Self administers all medications as prescribed . Currently unable to blood sugars daily and keep log: Date Fasting Blood Glucose Before Supper Comments  9 - April 91    10 - April     11 - April     12 - April     13 - April     14 - April  62* *Felt jittery & treated low  15 - April 113     Reports that he forgot to check his blood sugar from 4/10 to 4/13 . Calls provider office for new concerns or questions, particularly if he has future low blood sugars. . Patient to setup medication pillbox and start filling weekly as medication adherence tool . Patient to eat regular meals . Patient to pick up glimepiride prescription from [facility] tomorrow.   Please see past updates related to this goal by clicking on the "Past Updates" button in the selected goal         The patient verbalized understanding of instructions provided today and declined a print copy of patient instruction materials.   Telephone follow up appointment with CCM team member scheduled for: 05/07/18 at 2 pm  [name], PharmD, [facility] Clinical Pharmacist [facility] [employer] [phone]

## 2018-05-07 ENCOUNTER — Encounter: Payer: Self-pay | Admitting: Pharmacist

## 2018-05-07 ENCOUNTER — Ambulatory Visit (INDEPENDENT_AMBULATORY_CARE_PROVIDER_SITE_OTHER): Payer: Medicare HMO | Admitting: Pharmacist

## 2018-05-07 ENCOUNTER — Other Ambulatory Visit: Payer: Self-pay | Admitting: Family Medicine

## 2018-05-07 DIAGNOSIS — E785 Hyperlipidemia, unspecified: Secondary | ICD-10-CM | POA: Diagnosis not present

## 2018-05-07 DIAGNOSIS — E1169 Type 2 diabetes mellitus with other specified complication: Secondary | ICD-10-CM

## 2018-05-07 DIAGNOSIS — E113293 Type 2 diabetes mellitus with mild nonproliferative diabetic retinopathy without macular edema, bilateral: Secondary | ICD-10-CM | POA: Diagnosis not present

## 2018-05-07 DIAGNOSIS — E1142 Type 2 diabetes mellitus with diabetic polyneuropathy: Secondary | ICD-10-CM | POA: Diagnosis not present

## 2018-05-07 DIAGNOSIS — I10 Essential (primary) hypertension: Secondary | ICD-10-CM | POA: Diagnosis not present

## 2018-05-07 DIAGNOSIS — E1165 Type 2 diabetes mellitus with hyperglycemia: Secondary | ICD-10-CM

## 2018-05-07 DIAGNOSIS — IMO0002 Reserved for concepts with insufficient information to code with codable children: Secondary | ICD-10-CM

## 2018-05-07 MED ORDER — ACCU-CHEK AVIVA VI STRP: ORAL_STRIP | 3 refills | Status: DC

## 2018-05-07 MED ORDER — ACCU-CHEK AVIVA PLUS W/DEVICE KIT: PACK | 0 refills | Status: AC

## 2018-05-07 MED ORDER — ACCU-CHEK FASTCLIX LANCETS MISC: 3 refills | Status: AC

## 2018-05-08 NOTE — Chronic Care Management (AMB) (Signed)
Chronic Care Management   Follow Up Note   05/08/2018 Name: [name] MRN: [id number] DOB: Jun 12, 1945  Referred by: [name], DO Reason for referral : Chronic Care Management (Patient Phone Call) and Care Coordination (Pharmacy Phone Call)   [name] is a 73 y.o. year old male who is a primary care patient of [name], DO. The CCM team was consulted for assistance with chronic disease management and care coordination needs.    I reached out to [name] by phone today to follow up regarding medication adherence, medication management and medication assistance.  Review of patient status, including review of consultants reports, relevant laboratory and other test results, and collaboration with appropriate care team members and the patient's provider was performed as part of comprehensive patient evaluation and provision of chronic care management services.    Goals Addressed            This Visit's Progress   . "I want to get my A1C down"       Current Barriers:  [name] Medication Adherence  . Financial Barriers . Knowledge deficits related to diet  Pharmacist Clinical Goal(s):  [name] Over the next 30 days, patient will work with PharmD to address needs related to address needs related to diabetes management.   Over the next 30 days, patient will demonstrate Improved medication adherence as evidenced by patient report of adherence  Interventions: . Collaborate with [facility] to assist with meter billing issues o Patient is able to pick up his glimepiride and Accu-Chek Aviva Plus meter and supplies  Counsel patient step-by-step through how to use his Accu-chek Aviva Plus meter and supplies. Troubleshoot with patient as he uses this new meter for the first time to check his blood sugar  Patient successfully checks his blood sugar with the new meter, reading is 86 mg/dL (>2 hours post lunch) . Counsel patient on the importance  of medication adherence . Verbally support patient as he fills his weekly pillbox for his morning medication  o Patient reports that he forgot to pick up the additional weekly pillbox from his pharmacy today for his evening medication (metformin evening dose) o Reports that he will keep the metformin bottle on his dining table to aid him with remembering to take this every evening with supper.  Counsel patient on importance of checking his blood sugar regularly and keeping log  Counseled patient on the importance of eating regular meals  . Collaborate with [facility] C[facility] to assist patient with patient assistance application for Trulicity o Patient states that he has started filling out the application o Remind patient about need for supporting financial document o Counsel patient on how to return application to [facility]  Patient Self Care Activities:  . Self administers all medications as prescribed . Currently unable to consistently check blood sugars daily and keep log. . Calls provider office for new concerns or questions, particularly if he has future low blood sugars. . Patient to eat regular meals   Please see past updates related to this goal by clicking on the "Past Updates" button in the selected goal         PLAN  Telephone follow up appointment with CCM team member scheduled for: 05/11/18 at 3 pm for:  Patient to report blood sugar records  Call [facility] with patient to arrange for patient to receive future refills of testing supplies.  Follow up regarding patient assistance application.  [name], PharmD, [facility] Clinical Pharmacist [place]  [facility] [employer] [phone]

## 2018-05-08 NOTE — Patient Instructions (Signed)
Thank you allowing the Chronic Care Management Team to be a part of your care! It was a pleasure speaking with you today!     CCM (Chronic Care Management) Team    Janci Minor RN, BSN Nurse Care Coordinator  [phone]   [name] PharmD  Clinical Pharmacist  [phone]   Dickie La LCSW Clinical Social Worker [phone]  Visit Information  Goals Addressed            This Visit's Progress   . "I want to get my A1C down"       Current Barriers:  [name] Medication Adherence  . Financial Barriers . Knowledge deficits related to diet  Pharmacist Clinical Goal(s):  [name] Over the next 30 days, patient will work with PharmD to address needs related to address needs related to diabetes management.   Over the next 30 days, patient will demonstrate Improved medication adherence as evidenced by patient report of adherence  Interventions: . Collaborate with patient's [facility] to assist with meter billing issues o Patient is able to pick up his glimepiride and Accu-Chek Aviva Plus meter and supplies  Counsel patient step-by-step through how to use his Accu-chek Aviva Plus meter and supplies. Troubleshoot with patient as he uses this new meter for the first time to check his blood sugar  Patient successfully checks his blood sugar with the new meter, reading is 86 mg/dL (>2 hours post lunch) . Counsel patient on the importance of medication adherence . Verbally support patient as he fills his weekly pillbox for his morning medication  o Patient reports that he forgot to pick up the additional weekly pillbox from his pharmacy today for his evening medication (metformin evening dose) o Reports that he will keep the metformin bottle on his dining table to aid him with remembering to take this every evening with supper.  Counsel patient on importance of checking his blood sugar regularly and keeping log  Counseled patient on the importance of eating regular meals   . Collaborate with [facility] to assist patient with patient assistance application for Trulicity o Patient states that he has started filling out the application o Remind patient about need for supporting financial document o Counsel patient on how to return application to [facility]  Patient Self Care Activities:  . Self administers all medications as prescribed . Currently unable to consistently check blood sugars daily and keep log. . Calls provider office for new concerns or questions, particularly if he has future low blood sugars. . Patient to eat regular meals   Please see past updates related to this goal by clicking on the "Past Updates" button in the selected goal         The patient verbalized understanding of instructions provided today and declined a print copy of patient instruction materials.   Telephone follow up appointment with CCM team member scheduled for: 05/11/18  [name], PharmD, [facility] Clinical Pharmacist [facility] [phone]

## 2018-05-11 ENCOUNTER — Ambulatory Visit: Payer: Self-pay | Admitting: Pharmacist

## 2018-05-11 ENCOUNTER — Other Ambulatory Visit: Payer: Self-pay | Admitting: Pharmacy Technician

## 2018-05-11 DIAGNOSIS — E113293 Type 2 diabetes mellitus with mild nonproliferative diabetic retinopathy without macular edema, bilateral: Secondary | ICD-10-CM

## 2018-05-11 NOTE — Patient Outreach (Signed)
[facility]) Care Management  05/11/2018  [name] Feb 23, 1945 [id number]   ADDENDUM  Incoming call received from patient in regards to [name] application for Trulicity.  Spoke to patient, HIPAA identifiers verified. Patient informed that he had received the applications but did not think he would qualify based on his income. Reviewed income requirements with patient. Patient informed he would complete the application. Informed patient he could either mail the application into me with the envelope that I enclosed with his packet or he could drop the application off to [employer] whichever was most convenient for him.  Will followup with patient in 10-14 business days if application is not received back.  [name] P. Tavius Turgeon, CPhT Musician Care Management [phone]

## 2018-05-11 NOTE — Patient Outreach (Signed)
[facility]) Care Management  05/11/2018  [name] 09-10-1945 [id number]  Unsuccessful outreach call placed to patient in regards to [name] application for Trulicity.  This was my 2nd call. Unfortunately patient did not answer the phone. HIPAA compliant voicemail left. Was calling patient to inquire if he had received the application that was mailed to him on 04/23/2018.  Will followup with 3rd call attempt in 3-5 business days.  [name] P. Lucero Ide, CPhT Musician Care Management [phone]

## 2018-05-11 NOTE — Chronic Care Management (AMB) (Signed)
  Chronic Care Management   Note  05/11/2018 Name: Jamespatrick Chartrand MRN: [id number] DOB: May 22, 1945   Outreach call to Mr. Clinkscales today, as scheduled. Mr. Prout states that he is currently still taking his nap, since returning home from work. Patient asks that we reschedule for tomorrow afternoon.  Note that per chart, [facility] Noreene Larsson Simcox reached out again to patient regarding his patient assistance application and left him a Engineer, technical sales. Ask Mr. Ravenscroft to give Noreene Larsson a call after he wakes up. Patient is agreeable to this plan.   Follow up plan: Telephone follow up appointment with CCM Pharmacist scheduled for: 05/12/18  [name], PharmD, [facility] Clinical Pharmacist [facility] Care Management [phone]

## 2018-05-12 ENCOUNTER — Other Ambulatory Visit: Payer: Self-pay

## 2018-05-12 ENCOUNTER — Ambulatory Visit: Payer: Medicare HMO | Admitting: Pharmacist

## 2018-05-12 DIAGNOSIS — E1169 Type 2 diabetes mellitus with other specified complication: Secondary | ICD-10-CM | POA: Diagnosis not present

## 2018-05-12 DIAGNOSIS — E113293 Type 2 diabetes mellitus with mild nonproliferative diabetic retinopathy without macular edema, bilateral: Secondary | ICD-10-CM | POA: Diagnosis not present

## 2018-05-12 DIAGNOSIS — I10 Essential (primary) hypertension: Secondary | ICD-10-CM | POA: Diagnosis not present

## 2018-05-12 DIAGNOSIS — E785 Hyperlipidemia, unspecified: Secondary | ICD-10-CM | POA: Diagnosis not present

## 2018-05-12 DIAGNOSIS — E1165 Type 2 diabetes mellitus with hyperglycemia: Secondary | ICD-10-CM | POA: Diagnosis not present

## 2018-05-12 DIAGNOSIS — E1142 Type 2 diabetes mellitus with diabetic polyneuropathy: Secondary | ICD-10-CM | POA: Diagnosis not present

## 2018-05-12 NOTE — Patient Instructions (Signed)
Thank you allowing the Chronic Care Management Team to be a part of your care! It was a pleasure speaking with you today!     CCM (Chronic Care Management) Team    Janci Minor RN, BSN Nurse Care Coordinator  [phone]   [name] PharmD  Clinical Pharmacist  [phone]   Dickie La LCSW Clinical Social Worker [phone]  Visit Information  Goals Addressed            This Visit's Progress   . "I want to get my A1C down"       Current Barriers:  [name] Medication Adherence  . Financial Barriers . Knowledge deficits related to diet  Pharmacist Clinical Goal(s):  [name] Over the next 30 days, patient will work with PharmD to address needs related to address needs related to diabetes management.   Over the next 30 days, patient will demonstrate Improved medication adherence as evidenced by patient report of adherence  Interventions: . Call [facility] with patient on the line, speak with representative Crystal o Make request for patient to receive future diabetes testing supplies (Accu-chek Aviva Plus test strips and Softclix lancets) directly from [facility] to ensure that these will be free to the patient - paperwork to be faxed to Dr. Althea Charon for his approval. o Confirm that patient has a quarterly [employer] Over the Counter benefit and request that patient be mailed a catalog for this benefit. [name] Counsel patient on the importance of medication adherence o Patient reviews his weekly pillbox for morning medications and denies any missed doses since starting using pillbox o Verbally support patient as he fills his weekly pillbox for his evening medication o Reports taking Trulicity injection today   Counsel patient on importance of checking his blood sugar regularly and keeping log  Counseled patient on the importance of eating regular meals   Patient states that he has been eating breakfast, often [place] sausages with peanut butter  crackers or chicken salad  . Collaborate with [facility] to assist patient with patient assistance application for Trulicity o Answer patient's questions about this application o Patient states that he will bring this completed application and the required income document to the office tomorrow to have it faxed to [facility] CPhT o Collaborate with Nisha in the office to confirm the hours that the office will be open and provide the fax number for [facility] CPhT, [phone]  Patient Self Care Activities:  . Self administers all medications as prescribed . Currently unable to consistently check blood sugars daily and keep log. Date Fasting Blood Glucose Before Lunch Comments  4/16  98   4/17  71   4/18     4/19  80   4/20  105   4/21 173* 114 *not sure why high   . Calls provider office for new concerns or questions, particularly if he has future low blood sugars. . Patient to eat regular meals  Please see past updates related to this goal by clicking on the "Past Updates" button in the selected goal        The patient verbalized understanding of instructions provided today and declined a print copy of patient instruction materials.   Telephone follow up appointment with CCM team member scheduled for: 05/20/18 at 3 pm  [name], PharmD, [facility] Clinical Pharmacist [facility] [employer] [phone]

## 2018-05-12 NOTE — Chronic Care Management (AMB) (Signed)
Chronic Care Management   Follow Up Note   05/12/2018 Name: [name] MRN: [id number] DOB: 08/02/1945  Referred by: [name], DO Reason for referral : Chronic Care Management (Patient Phone Call) and Care Coordination   [name] is a 73 y.o. year old male who is a primary care patient of [name], DO. The CCM team was consulted for assistance with chronic disease management and care coordination needs.    I reached out to [name] by phone today regarding medication adherence, medication assistance and diabetes management.  Review of patient status, including review of consultants reports, relevant laboratory and other test results, and collaboration with appropriate care team members and the patient's provider was performed as part of comprehensive patient evaluation and provision of chronic care management services.    Goals Addressed            This Visit's Progress   . "I want to get my A1C down"       Current Barriers:  [name] Medication Adherence  . Financial Barriers . Knowledge deficits related to diet  Pharmacist Clinical Goal(s):  [name] Over the next 30 days, patient will work with PharmD to address needs related to address needs related to diabetes management.   Over the next 30 days, patient will demonstrate Improved medication adherence as evidenced by patient report of adherence  Interventions: . Call [facility] with patient on the line, speak with representative Crystal o Make request for patient to receive future diabetes testing supplies (Accu-chek Aviva Plus test strips and Softclix lancets) directly from [facility] Order to ensure that these will be free to the patient - paperwork to be faxed to Dr. Althea Charon for his approval. o Confirm that patient has a quarterly [employer] Over the Counter benefit and request that patient be mailed a catalog for this benefit. [name] Counsel patient on the importance of medication  adherence o Patient reviews his weekly pillbox for morning medications and denies any missed doses since starting using pillbox o Verbally support patient as he fills his weekly pillbox for his evening medication o Reports taking Trulicity injection today   Counsel patient on importance of checking his blood sugar regularly and keeping log  Counseled patient on the importance of eating regular meals   Patient states that he has been eating breakfast, often [place] sausages with peanut butter crackers or chicken salad  . Collaborate with [facility] to assist patient with patient assistance application for Trulicity o Answer patient's questions about this application o Patient states that he will bring this completed application and the required income document to the office tomorrow to have it faxed to [facility] CPhT o Collaborate with Nisha in the office to confirm the hours that the office will be open and provide the fax number for [facility] CPhT, [phone]  Patient Self Care Activities:  . Self administers all medications as prescribed . Currently unable to consistently check blood sugars daily and keep log. Date Fasting Blood Glucose Before Lunch Comments  4/16  98   4/17  71   4/18     4/19  80   4/20  105   4/21 173* 114 *not sure why high   . Calls provider office for new concerns or questions, particularly if he has future low blood sugars. . Patient to eat regular meals  Please see past updates related to this goal by clicking on the "Past Updates" button in the selected goal  Plan  Telephone follow up appointment with CCM team member scheduled for: 05/20/18 at 3 pm  Duanne MoronElisabeth Passion Lavin, PharmD, [facility] Clinical Pharmacist [facility] [phone]

## 2018-05-14 ENCOUNTER — Other Ambulatory Visit: Payer: Self-pay | Admitting: Pharmacy Technician

## 2018-05-14 NOTE — Patient Outreach (Signed)
[facility]) Care Management  05/14/2018  [name] 1945-12-16 [id number]    Received completed application along with all documents and signatures from both patient and provider for [name] application for Trulicity.  Submitted completed application to [employer] and [employer] via fax.  Will followup with company in 3-5 business days to inquire on status of application.  Khamryn Calderone P. Kameryn Tisdel, CPhT Musician Care Management [phone]

## 2018-05-18 ENCOUNTER — Ambulatory Visit: Payer: Self-pay | Admitting: *Deleted

## 2018-05-18 NOTE — Chronic Care Management (AMB) (Signed)
  Chronic Care Management   Note  05/18/2018 Name: [name]. MRN: [id number] DOB: 06-Mar-1945  Placed a call to [name], DO today to discuss goals for managing their chronic condition of DM. Unable to reach patient at this time, was able to leave a HIPPA compliant voice mail and requested a return call.     Follow up plan: A HIPPA compliant phone message was left for the patient providing contact information and requesting a return call.  The CM team will reach out to the patient again over the next 14 days.   Ma Rings Keshaun Dubey RN, BSN Nurse Case Health and safety inspector [facility] Care Management  [phone]) Business Mobile

## 2018-05-19 ENCOUNTER — Ambulatory Visit: Payer: Self-pay | Admitting: *Deleted

## 2018-05-19 ENCOUNTER — Other Ambulatory Visit: Payer: Self-pay | Admitting: Pharmacy Technician

## 2018-05-19 DIAGNOSIS — E1165 Type 2 diabetes mellitus with hyperglycemia: Secondary | ICD-10-CM | POA: Diagnosis not present

## 2018-05-19 DIAGNOSIS — E1142 Type 2 diabetes mellitus with diabetic polyneuropathy: Secondary | ICD-10-CM | POA: Diagnosis not present

## 2018-05-19 DIAGNOSIS — E113293 Type 2 diabetes mellitus with mild nonproliferative diabetic retinopathy without macular edema, bilateral: Secondary | ICD-10-CM | POA: Diagnosis not present

## 2018-05-19 DIAGNOSIS — E1169 Type 2 diabetes mellitus with other specified complication: Secondary | ICD-10-CM | POA: Diagnosis not present

## 2018-05-19 DIAGNOSIS — E785 Hyperlipidemia, unspecified: Secondary | ICD-10-CM | POA: Diagnosis not present

## 2018-05-19 DIAGNOSIS — I10 Essential (primary) hypertension: Secondary | ICD-10-CM | POA: Diagnosis not present

## 2018-05-19 NOTE — Patient Outreach (Signed)
[facility]) Care Management  05/19/2018  Phinneas Moga. [birth year] [id number]    Care coordination call placed to [facility] in regards to patient's application for Trulicity.  Spoke to [place] who said patient had been APPROVED 05/14/2018-01/21/2019. Galvin Proffer informed that the delivery status said in progress as of 05/15/2018. [place] transferred me to [facility]. Spoke to to [place] at [facility] who informed they were in need of a prescription to be faxed of [phone].   Refaxed prescription to [facility] as requested.  Will followup with [name] in 2-3 business days to inquire about delivery status. Patient was previously made aware to be expecting a phone call to set up delivery to his home.  Simra Fiebig P. Masiah Lewing, CPhT Musician Care Management [phone]

## 2018-05-19 NOTE — Chronic Care Management (AMB) (Signed)
  Chronic Care Management   Follow Up Note   05/19/2018 Name: [name]. MRN: [id number] DOB: 10/29/1945  Referred by: [name], DO Reason for referral : Chronic Care Management (DM )   [name]. is a 73 y.o. year old male who is a primary care patient of [name], DO. The CCM team was consulted for assistance with chronic disease management and care coordination needs.    Review of patient status, including review of consultants reports, relevant laboratory and other test results, and collaboration with appropriate care team members and the patient's provider was performed as part of comprehensive patient evaluation and provision of chronic care management services.    Goals Addressed            This Visit's Progress   . I need help with my diabetic diet (pt-stated)       Current Barriers:  [name] Knowledge Deficits related to basic Diabetes pathophysiology and self care/management  . Diabetic diet  Case Manager Clinical Goal(s):  Over the next 90 days, patient will demonstrate improved adherence to prescribed treatment plan for diabetes self care/management as evidenced by:  . adherence to ADA/ carb modified diet  Interventions:  . Provided education to patient about basic DM disease process . Discussed plans with patient for ongoing care management follow up and provided patient with direct contact information for care management team  . Reviewed basics of ADA/Carb modified diet . Plan to mail patient written education about following diabetic diet  Patient Self Care Activities:  . UNABLE to independently adhere to carb modified diabetic diet  Initial goal documentation          The CM team will reach out to the patient again over the next 30 days.    Christopher Rings [name] RN, BSN Nurse Case Health and safety inspector [facility] Care Management  [phone]) Business [employer]

## 2018-05-19 NOTE — Patient Instructions (Signed)
Thank you allowing the Chronic Care Management Team to be a part of your care! It was a pleasure speaking with you today!  1. Review education provided via the mail related to diabetic diet  CCM (Chronic Care Management) Team   Costella Hatcher RN, BSN Nurse Care Coordinator  [phone]  [name] PharmD  Clinical Pharmacist  [phone]  Dickie La LCSW Clinical Social Worker [phone]  Goals Addressed            This Visit's Progress   . I need help with my diabetic diet (pt-stated)       Current Barriers:  [name] Knowledge Deficits related to basic Diabetes pathophysiology and self care/management  . Diabetic diet  Case Manager Clinical Goal(s):  Over the next 90 days, patient will demonstrate improved adherence to prescribed treatment plan for diabetes self care/management as evidenced by:  . adherence to ADA/ carb modified diet  Interventions:  . Provided education to patient about basic DM disease process . Discussed plans with patient for ongoing care management follow up and provided patient with direct contact information for care management team  . Reviewed basics of ADA/Carb modified diet . Plan to mail patient written education about following diabetic diet  Patient Self Care Activities:  . UNABLE to independently adhere to carb modified diabetic diet  Initial goal documentation         The patient verbalized understanding of instructions provided today and declined a print copy of patient instruction materials.   The CM team will reach out to the patient again over the next 30 days.

## 2018-05-20 ENCOUNTER — Other Ambulatory Visit: Payer: Self-pay

## 2018-05-20 ENCOUNTER — Ambulatory Visit: Payer: Self-pay | Admitting: Pharmacist

## 2018-05-20 DIAGNOSIS — IMO0002 Reserved for concepts with insufficient information to code with codable children: Secondary | ICD-10-CM

## 2018-05-20 DIAGNOSIS — E1142 Type 2 diabetes mellitus with diabetic polyneuropathy: Secondary | ICD-10-CM | POA: Diagnosis not present

## 2018-05-20 DIAGNOSIS — E1165 Type 2 diabetes mellitus with hyperglycemia: Secondary | ICD-10-CM | POA: Diagnosis not present

## 2018-05-20 DIAGNOSIS — E785 Hyperlipidemia, unspecified: Secondary | ICD-10-CM | POA: Diagnosis not present

## 2018-05-20 DIAGNOSIS — E1169 Type 2 diabetes mellitus with other specified complication: Secondary | ICD-10-CM | POA: Diagnosis not present

## 2018-05-20 DIAGNOSIS — I10 Essential (primary) hypertension: Secondary | ICD-10-CM | POA: Diagnosis not present

## 2018-05-20 DIAGNOSIS — E113293 Type 2 diabetes mellitus with mild nonproliferative diabetic retinopathy without macular edema, bilateral: Secondary | ICD-10-CM | POA: Diagnosis not present

## 2018-05-20 NOTE — Chronic Care Management (AMB) (Signed)
  Chronic Care Management   Follow Up Note   05/20/2018 Name: [name]. MRN: [id number] DOB: 11-30-1945  Referred by: [name], DO Reason for referral : Chronic Care Management (Patient Phone Call)   [name]. is a 73 y.o. year old male who is a primary care patient of [name], DO. The CCM team was consulted for assistance with chronic disease management and care coordination needs.    I reached out to [name] by phone today regarding medication adherence, medication assistance and diabetes management.  Review of patient status, including review of consultants reports, relevant laboratory and other test results, and collaboration with appropriate care team members and the patient's provider was performed as part of comprehensive patient evaluation and provision of chronic care management services.    Goals Addressed            This Visit's Progress   . "I want to get my A1C down"       Current Barriers:  [name] Medication Adherence  . Financial Barriers . Knowledge deficits related to diet . Lack of blood sugar results for clinic team  Pharmacist Clinical Goal(s):  [name] Over the next 30 days, patient will work with PharmD to address needs related to address needs related to diabetes management.   Over the next 30 days, patient will demonstrate Improved medication adherence as evidenced by patient report of adherence  Interventions: . Counsel patient on the importance of medication adherence o Patient reviews his weekly pillboxes and denies any missed doses. Reports that he fills his pillbox each Sunday. o Reports taking Trulicity injection on Monday.  Counsel patient on importance of checking his blood sugar daily as directed and keeping log  Counseled patient on the importance of eating regular meals   Review low blood sugar signs and management  Patient denies any blood sugars <70 mg/dL this week.   Remind patient to keep  quick-acting 15-gram source of sugar (3-4 butterscotch candies) with him, particularly when at work . Counsel patient again about COVID-19 prevention, particularly social distancing as possible at work . Collaborate with [facility] to assist patient with patient assistance application for Trulicity o Per notes from [facility], patient has been approved for assistance program  Patient Self Care Activities:  . Self administers all medications as prescribed . Currently unable to consistently check blood sugars daily and keep log. Date Fasting Blood Glucose Before Lunch After Lunch Before Supper  23 - April   103   24 - April      25 - April      26 - April 137     27  - April    72  28 - April  75    29 - April 113  114    . Calls provider office for new concerns or questions, particularly if he has future low blood sugars. . Patient to eat regular meals  Please see past updates related to this goal by clicking on the "Past Updates" button in the selected goal        Plan  1) Follow up appointment with PCP scheduled for 05/25/2018 at 2:20 pm 2) Telephone follow up appointment with CCM Pharmacist scheduled for: 05/28/2018 at 3 pm  [name], PharmD, [facility] Clinical Pharmacist [facility] [phone]

## 2018-05-20 NOTE — Patient Instructions (Addendum)
Thank you allowing the Chronic Care Management Team to be a part of your care! It was a pleasure speaking with you today!     CCM (Chronic Care Management) Team    Janci Minor RN, BSN Nurse Care Coordinator  [phone]   [name] PharmD  Clinical Pharmacist  [phone]   Dickie La LCSW Clinical Social Worker [phone]  Visit Information  Goals Addressed            This Visit's Progress   . "I want to get my A1C down"       Current Barriers:  [name] Medication Adherence  . Financial Barriers . Knowledge deficits related to diet . Lack of blood sugar results for clinic team  Pharmacist Clinical Goal(s):  [name] Over the next 30 days, patient will work with PharmD to address needs related to address needs related to diabetes management.   Over the next 30 days, patient will demonstrate Improved medication adherence as evidenced by patient report of adherence  Interventions: . Counsel patient on the importance of medication adherence o Patient reviews his weekly pillboxes and denies any missed doses. Reports that he fills his pillbox each Sunday. o Reports taking Trulicity injection on Monday.  Counsel patient on importance of checking his blood sugar daily as directed and keeping log  Counseled patient on the importance of eating regular meals   Review low blood sugar signs and management  Patient denies any blood sugars <70 mg/dL this week.   Remind patient to keep quick-acting 15-gram source of sugar (3-4 butterscotch candies) with him, particularly when at work . Counsel patient again about COVID-19 prevention, particularly social distancing as possible at work . Collaborate with [facility] to assist patient with patient assistance application for Trulicity o Per notes from [facility], patient has been approved for assistance program  Patient Self Care Activities:  . Self administers all medications as prescribed . Currently unable to consistently  check blood sugars daily and keep log. Date Fasting Blood Glucose Before Lunch After Lunch Before Supper  23 - April   103   24 - April      25 - April      26 - April 137     27  - April    72  28 - April  75    29 - April 113  114    . Calls provider office for new concerns or questions, particularly if he has future low blood sugars. . Patient to eat regular meals  Please see past updates related to this goal by clicking on the "Past Updates" button in the selected goal        The patient verbalized understanding of instructions provided today and declined a print copy of patient instruction materials.   Telephone follow up appointment with CCM team member scheduled for: 05/26/2018 at 3 pm  [name], PharmD, [facility] Clinical Pharmacist [facility] [employer] [phone]

## 2018-05-22 ENCOUNTER — Ambulatory Visit: Payer: Self-pay | Admitting: Pharmacist

## 2018-05-22 ENCOUNTER — Other Ambulatory Visit: Payer: Self-pay | Admitting: Pharmacy Technician

## 2018-05-22 DIAGNOSIS — E113293 Type 2 diabetes mellitus with mild nonproliferative diabetic retinopathy without macular edema, bilateral: Secondary | ICD-10-CM

## 2018-05-22 NOTE — Chronic Care Management (AMB) (Signed)
  Chronic Care Management   Follow Up Note   05/22/2018 Name: [name]. MRN: [id number] DOB: 1945-02-20  Referred by: [name], DO Reason for referral : Chronic Care Management (Patient Phone Call)   Christopher Puca. is a 73 y.o. year old male who is a primary care patient of [name], DO. The CCM team was consulted for assistance with chronic disease management and care coordination needs.    Receive a call from [facility]Ph[facility] Pattricia Boss, she asks me to reach out to patient regarding and issue that he is having with his weekly pillbox.  I reached out to [name]. by phone today. Patient reports difficulty with keeping the top closed of the pillbox that he uses for his evening metformin dose. Advise patient about the cost of obtaining a replacement pillbox. [name] states that he will obtain a replacement.  Review of patient status, including review of consultants reports, relevant laboratory and other test results, and collaboration with appropriate care team members and the patient's provider was performed as part of comprehensive patient evaluation and provision of chronic care management services.    Goals Addressed            This Visit's Progress   . "I want to get my A1C down"       Current Barriers:  [name] Medication Adherence  . Financial Barriers . Knowledge deficits related to diet . Lack of blood sugar results for clinic team  Pharmacist Clinical Goal(s):  [name] Over the next 30 days, patient will work with PharmD to address needs related to address needs related to diabetes management.   Over the next 30 days, patient will demonstrate Improved medication adherence as evidenced by patient report of adherence  Interventions:  Counsel on importance of medication adherence and continuing to use weekly pillbox as tool to aid with adherence.  Patient reports that he will get a new weekly pillbox for his evening medications.  Counsel  patient on importance of checking his blood sugar daily as directed and keeping log . Collaborate with [facility] to assist patient with patient assistance application for Trulicity o Per notes from [facility], patient has been approved for assistance program and patient reports that he has called to schedule delivery of the medication to his home.  Patient Self Care Activities:  . Self administers all medications as prescribed . Currently unable to consistently check blood sugars daily and keep log. Date Fasting Blood Glucose Before Lunch After Lunch Before Supper Bedtime  23 - April   103    24 - April       25 - April       26 - April 137      27 - April    72   28 - April  75     29 - April 113  114    30 - April     78  1 - May 120       . Calls provider office for new concerns or questions, particularly if he has future low blood sugars. . Patient to eat regular meals  Please see past updates related to this goal by clicking on the "Past Updates" button in the selected goal        Plan  Will follow up with [name] via telephone on 05/27/2018 as scheduled.  [name], PharmD, [facility] Clinical Pharmacist [facility] [employer] [phone]

## 2018-05-22 NOTE — Patient Outreach (Signed)
[facility]) Care Management  05/22/2018  [name]. 10/03/1945 [id number]  ADDENDUM  Successful outreach call placed to patient in regards to [name] application for Trulicity.  Spoke to patient, HIPAA identifiers verified.  Informed patient that he would need to call [name] to set up shipment for his Trulicity. Provided patient phone number to [employer]. Patient verbalized understanding.  Will followup with patient in 5-7 business days to confirm receipt of medication.  Markeise Mathews P. Darwin Rothlisberger, CPhT [facility] [phone]

## 2018-05-22 NOTE — Patient Outreach (Signed)
[facility]) Care Management  05/22/2018  [name]. 02-Nov-1945 [id number]   ADDENDUM  Incoming call received from patient, HIPAA identifiers verified.  Mr. Beato called to inform me that he had called [name] and scheduled delivery of the Trulicity for Tuesday. He informed me and the representative at [facility] that he may not be home at the time of delivery as he works from 6am-1230pm every day. Informed Mr. Misiaszek I would followup with him on Tuesday to see if he was able to take ownership of the delivery. If not then we would contact [name] for a 2nd delivery attempt. Patient verbalized understanding.  Mr. Wurtzel also informed me that the pill box that [facility] RPh [name] had gotten for him will not stay shut and his medications fall out. Will route note to [facility] RPh [name] about this concern.  Will followup with patient in 2-3 business days to inquire if he received Trulicity.  Emmett Bracknell P. Thera Basden, CPhT [facility] Management [phone]

## 2018-05-22 NOTE — Patient Outreach (Signed)
[facility]) Care Management  05/22/2018  [name]. 02/12/1945 [id number]   Care coordination call placed to [name] in regards to Trulicity application.  Spoke to [name] who informed the shipment status was pending. She called over to [employer] and spoke to [name] who informed the product was ready to ship for delivery but that they had tried to get in touch with patient concerning delivery date. She advised to have patient call [name] to set up shipment.  Will outreach patient.  [name] P. Baptiste Littler, CPhT [facility] Management [phone]

## 2018-05-22 NOTE — Patient Instructions (Signed)
Thank you allowing the Chronic Care Management Team to be a part of your care! It was a pleasure speaking with you today!     CCM (Chronic Care Management) Team    Janci Minor RN, BSN Nurse Care Coordinator  [phone]   [name] PharmD  Clinical Pharmacist  [phone]   Dickie La LCSW Clinical Social Worker [phone]  Visit Information  Goals Addressed            This Visit's Progress   . "I want to get my A1C down"       Current Barriers:  [name] Medication Adherence  . Financial Barriers . Knowledge deficits related to diet . Lack of blood sugar results for clinic team  Pharmacist Clinical Goal(s):  [name] Over the next 30 days, patient will work with PharmD to address needs related to address needs related to diabetes management.   Over the next 30 days, patient will demonstrate Improved medication adherence as evidenced by patient report of adherence  Interventions:  Counsel on importance of medication adherence and continuing to use weekly pillbox as tool to aid with adherence.  Patient reports that he will get a new weekly pillbox for his evening medications.  Counsel patient on importance of checking his blood sugar daily as directed and keeping log . Collaborate with [facility] to assist patient with patient assistance application for Trulicity o Per notes from [facility]Ph[facility], patient has been approved for assistance program and patient reports that he has called to schedule delivery of the medication to his home.  Patient Self Care Activities:  . Self administers all medications as prescribed . Currently unable to consistently check blood sugars daily and keep log. Date Fasting Blood Glucose Before Lunch After Lunch Before Supper Bedtime  23 - April   103    24 - April       25 - April       26 - April 137      27 - April    72   28 - April  75     29 - April 113  114    30 - April     78  1 - May 120       . Calls provider office for new  concerns or questions, particularly if he has future low blood sugars. . Patient to eat regular meals  Please see past updates related to this goal by clicking on the "Past Updates" button in the selected goal        The patient verbalized understanding of instructions provided today and declined a print copy of patient instruction materials.   Telephone follow up appointment with CCM team member scheduled for: 05/27/2018  [name], PharmD, [facility] Clinical Pharmacist [facility] [phone]

## 2018-05-25 ENCOUNTER — Ambulatory Visit (INDEPENDENT_AMBULATORY_CARE_PROVIDER_SITE_OTHER): Payer: Medicare HMO | Admitting: Family Medicine

## 2018-05-25 ENCOUNTER — Ambulatory Visit: Payer: Medicare HMO | Admitting: Family Medicine

## 2018-05-25 ENCOUNTER — Encounter: Payer: Self-pay | Admitting: Family Medicine

## 2018-05-25 ENCOUNTER — Other Ambulatory Visit: Payer: Self-pay

## 2018-05-25 DIAGNOSIS — E113293 Type 2 diabetes mellitus with mild nonproliferative diabetic retinopathy without macular edema, bilateral: Secondary | ICD-10-CM | POA: Diagnosis not present

## 2018-05-25 DIAGNOSIS — I10 Essential (primary) hypertension: Secondary | ICD-10-CM

## 2018-05-25 MED ORDER — LISINOPRIL-HYDROCHLOROTHIAZIDE 20-12.5 MG PO TABS: 1.0000 | ORAL_TABLET | Freq: Every day | ORAL | 3 refills | Status: DC

## 2018-05-25 NOTE — Assessment & Plan Note (Signed)
Previously elevated A1c >8, now significantly improved CBG readings back on Trulicity Not yet due for next A1c Followed by [facility] for financial assistance w/ Trulicity / testing supplies Complications - DM retinopathy and Hyperlipidemia - increases risk of vascular complication  Plan: - DISCONTINUE Glimepiride (Amaryl) 4mg  daily- due to occasional hypoglycemia 60s - he is hesitant to stop this med, but explained that given his recent readings, and now he has supply of trulicity he can take it longer term, he should not need this sulfonylurea medicine due to risk of hypoglycemia  - Continue Trulicity 1.5mg  weekly [place] injection (now has his own medicine from financial asst app) - Continue Metformin 1000mg  BID  - Encourage improved lifestyle - low carb, low sugar diet, reduce portion size, start regular exercise - Check CBG, bring log to next visit for review - pending new testing supplies ordered - Continue ASA, ACEi, resumed Statin Needs update DM Eye Dr Clydene Pugh 05/2018 or soonest available Follow-up 3 months

## 2018-05-25 NOTE — Patient Instructions (Addendum)
As discussed today - STOP taking Glimepiride (Amaryl) - 4mg  - we are discontinuing this medicine for now, due to risk of low blood sugars.  In the future - we can reconsider this medicine if it is truly needed - but I believe that it is not needed anymore.  CONTINUE Trulicity 1.5mg  weekly injection  CONTINUE Metformin 1000mg  twice a day  Refilled blood pressure medicine. Keep track of your BP and your sugar. Keep up the good work.   Please schedule a Follow-up Appointment to: Return in about 3 months (around 08/25/2018) for DM A1c.  If you have any other questions or concerns, please feel free to call the office or send a message through MyChart. You may also schedule an earlier appointment if necessary.  Additionally, you may be receiving a survey about your experience at our office within a few days to 1 week by e-mail or mail. We value your feedback.  Saralyn Pilar, DO [facility], [place]

## 2018-05-25 NOTE — Progress Notes (Signed)
Virtual Visit via Telephone The purpose of this virtual visit is to provide medical care while limiting exposure to the novel coronavirus (COVID19) for both patient and office staff.  Consent was obtained for phone visit:  Yes.   Answered questions that patient had about telehealth interaction:  Yes.   I discussed the limitations, risks, security and privacy concerns of performing an evaluation and management service by telephone. I also discussed with the patient that there may be a patient responsible charge related to this service. The patient expressed understanding and agreed to proceed.  Patient Location: Home Provider Location: [facility])  ---------------------------------------------------------------------- Chief Complaint  Patient presents with  . Diabetes    fasting blood sugars averaging 78-100 mcg     S: Reviewed CMA documentation. I have called patient and gathered additional HPI as follows:  CHRONIC DM, Type 2w/ Retinopathy - Last visit with me 05/01/18, for same problem, treated with samples again of Trulicity to restart after he had higher A1c >8, and reduced recently restarted Glimepiride from 71m x 2 down to x 1 a day but advised may reduce again in future once on Trulicity regularly, and referral to get assistance from our Case Management/Pharmacist to arrange financial assistance for med, see prior notes for background information. - Interval update with he has been connected with Chronic Care Management Pharmacist and has now completed and received his Trulicity with financial assistance, he is awaiting new testing supplies as well but these will now be covered too  - Today patient reports he is doing very well overall. No new major concerns. He does admit to having x 2 readings of hypoglycemia, range of 60s on his sugar in past few weeks, none recently. He has several CBG readings available and many are available on chart from last contact with  [facility], avg CBG around 100, he has frequent readings in 70s as well. No high reading >>009Meds: - Trulicity 13.8HWweekly injection - he did mess up one dosage admin error, now out early, request a sample - Metformin 10088mBID - Taking Glimepiride 62m32maily w/ breakfast - still taking this med as of today Reports good compliance. Tolerating well w/o side-effects Currently on ACEi Lifestyle: - Diet (Trying to improve diet) - Exercise (Limited exercise due to time- gradually improving) - Dr WooEllin Mayhewst DM Eye exam5/2019 w/ DM retinopathy, requiring monitoring Admits occasional hypoglycemia Denies polyuria, visual changes, numbness or tingling.  CHRONIC HTN: Reports no new concerns. Seems well controlled. Occasionally has flare up elevated BP and headache. Current Meds - Lisinopril-HCTZ 20-12.5mg67mce daily  - needs refill today Reports good compliance, took meds today. Tolerating well, w/o complaints. Denies CP, dyspnea, HA, edema, dizziness / lightheadedness  Denies any high risk travel to areas of current concern for COVID19. Denies any known or suspected exposure to person with or possibly with COVID19.  Denies any fevers, chills, sweats, body ache, cough, shortness of breath, sinus pain or pressure, headache, abdominal pain, diarrhea  Past Medical History:  Diagnosis Date  . Erectile dysfunction   . Right-sided chest pain    Social History   Tobacco Use  . Smoking status: Current Some Day Smoker    Types: Cigarettes  . Smokeless tobacco: Current User  . Tobacco comment: pack of cigarettes last 1-2 months - doesnt inhale  Substance Use Topics  . Alcohol use: Yes    Alcohol/week: 1.0 standard drinks    Types: 1 Cans of beer per week  Comment: 1-2 beers week   . Drug use: No    Current Outpatient Medications:  .  ACCU-CHEK AVIVA test strip, Check sugar up to 2 x daily, Disp: 100 each, Rfl: 3 .  Accu-Chek FastClix Lancets MISC, Check sugar up to 2 x daily,  Disp: 102 each, Rfl: 3 .  Blood Glucose Monitoring Suppl (ACCU-CHEK AVIVA PLUS) w/Device KIT, Use to check blood sugar up to x 2 daily, Disp: 1 kit, Rfl: 0 .  Dulaglutide (TRULICITY) 1.5 ZH/2.9JM SOPN, Inject 1.5 mg into the skin once a week., Disp: 4 pen, Rfl: 0 .  lisinopril-hydrochlorothiazide (ZESTORETIC) 20-12.5 MG tablet, Take 1 tablet by mouth daily., Disp: 90 tablet, Rfl: 3 .  metFORMIN (GLUCOPHAGE) 1000 MG tablet, Take 1 tablet (1,000 mg total) by mouth 2 (two) times daily with a meal., Disp: 180 tablet, Rfl: 3 .  pravastatin (PRAVACHOL) 10 MG tablet, Take 1 tablet (10 mg total) by mouth daily., Disp: 90 tablet, Rfl: 0 .  tadalafil (ADCIRCA/CIALIS) 20 MG tablet, Take 1 tablet (20 mg total) by mouth daily as needed for erectile dysfunction., Disp: 30 tablet, Rfl: 0 .  selenium sulfide (SELSUN) 2.5 % shampoo, Apply topically daily as needed for irritation. (Patient not taking: Reported on 05/25/2018), Disp: 118 mL, Rfl: 11  Depression screen [facility] 2/9 05/01/2018 10/14/2017 08/05/2017  Decreased Interest 0 0 0  Down, Depressed, Hopeless 0 0 0  PHQ - 2 Score 0 0 0  Altered sleeping - - -  Tired, decreased energy - - -  Change in appetite - - -  Feeling bad or failure about yourself  - - -  Trouble concentrating - - -  Moving slowly or fidgety/restless - - -  Suicidal thoughts - - -  PHQ-9 Score - - -  Difficult doing work/chores - - -    No flowsheet data found.  -------------------------------------------------------------------------- O: No physical exam performed due to remote telephone encounter.  Lab results reviewed.  Recent Labs    05/29/17 0812 10/14/17 1457 03/16/18 1137  HGBA1C 6.7* 6.5* 8.7*     Recent Results (from the past 2160 hour(s))  Hemoglobin A1c     Status: Abnormal   Collection Time: 03/16/18 11:37 AM  Result Value Ref Range   Hgb A1c MFr Bld 8.7 (H) <5.7 % of total Hgb    Comment: For someone without known diabetes, a hemoglobin A1c value of 6.5% or  greater indicates that they may have  diabetes and this should be confirmed with a follow-up  test. . For someone with known diabetes, a value <7% indicates  that their diabetes is well controlled and a value  greater than or equal to 7% indicates suboptimal  control. A1c targets should be individualized based on  duration of diabetes, age, comorbid conditions, and  other considerations. . Currently, no consensus exists regarding use of hemoglobin A1c for diagnosis of diabetes for children. .    Mean Plasma Glucose 203 (calc)   eAG (mmol/L) 11.2 (calc)    -------------------------------------------------------------------------- A&P:  Problem List Items Addressed This Visit    Controlled type 2 diabetes mellitus with retinopathy ([place]) - Primary    Previously elevated A1c >8, now significantly improved CBG readings back on Trulicity Not yet due for next A1c Followed by [place] for financial assistance w/ Trulicity / testing supplies Complications - DM retinopathy and Hyperlipidemia - increases risk of vascular complication  Plan: - DISCONTINUE Glimepiride (Amaryl) 20m daily- due to occasional hypoglycemia 60s - he is hesitant to  stop this med, but explained that given his recent readings, and now he has supply of trulicity he can take it longer term, he should not need this sulfonylurea medicine due to risk of hypoglycemia  - Continue Trulicity 7.2JL weekly [place] injection (now has his own medicine from financial asst app) - Continue Metformin 1063m BID  - Encourage improved lifestyle - low carb, low sugar diet, reduce portion size, start regular exercise - Check CBG, bring log to next visit for review - pending new testing supplies ordered - Continue ASA, ACEi, resumed Statin Needs update DM Eye Dr WEllin Mayhew5/2020 or soonest available Follow-up 3 months      Relevant Medications   lisinopril-hydrochlorothiazide (ZESTORETIC) 20-12.5 MG tablet   Essential hypertension     Controlled BP - Home BP readings infrequent but improved No known complications    Plan:  1. Continue current BP regimen - Lisinopril-HCTZ 20-12.525mdaily - refill today 2. Encourage improved lifestyle - low sodium diet,  Improve exercise 3. Continue monitor BP outside office, bring readings to next visit, if persistently >140/90 or new symptoms notify office sooner      Relevant Medications   lisinopril-hydrochlorothiazide (ZESTORETIC) 20-12.5 MG tablet      Meds ordered this encounter  Medications  . lisinopril-hydrochlorothiazide (ZESTORETIC) 20-12.5 MG tablet    Sig: Take 1 tablet by mouth daily.    Dispense:  90 tablet    Refill:  3    Follow-up: - Return in 3 months for Diabetes A1c  Patient verbalizes understanding with the above medical recommendations including the limitation of remote medical advice.  Specific follow-up and call-back criteria were given for patient to follow-up or seek medical care more urgently if needed.   - Time spent in direct consultation with patient on phone: 12 minutes   AlNobie PutnamDOLaconaroup 05/25/2018, 2:33 PM

## 2018-05-25 NOTE — Assessment & Plan Note (Signed)
Controlled BP - Home BP readings infrequent but improved No known complications    Plan:  1. Continue current BP regimen - Lisinopril-HCTZ 20-12.5mg  daily - refill today 2. Encourage improved lifestyle - low sodium diet,  Improve exercise 3. Continue monitor BP outside office, bring readings to next visit, if persistently >140/90 or new symptoms notify office sooner

## 2018-05-26 ENCOUNTER — Other Ambulatory Visit: Payer: Self-pay | Admitting: Pharmacy Technician

## 2018-05-26 ENCOUNTER — Telehealth: Payer: Self-pay

## 2018-05-26 NOTE — Patient Outreach (Signed)
[facility]) Care Management  05/26/2018  Eagle Kilduff. November 21, 1945 [id number]    Successful outreach call placed to patient in regards to [name] application for Trulicity.  Spoke to patient, HIPAA identifiers verified.  Patient informed he missed [employer] this morning. He called the number on the sticky note that they had left him. He arranged with [employer] to leave the package with his neighbor and he informed they will attempt to deliver again tomorrow.  Will followup with patient in 1-2 business days to inquire if medication was received.  Hayle Parisi P. Dewanna Hurston, CPhT [facility] Care Management [phone]

## 2018-05-26 NOTE — Patient Outreach (Signed)
[facility]) Care Management  05/26/2018  [name]. 09-22-1945 [id number]    ADDENDUM  Incoming call received from patient in regards to [name] application for Trulicity.  Spoke to patient, HIPAA identifiers verified.  Patient called to inform me that [employer] brought back by his Trulicity right after speaking to me. Patient informed he received 3 boxes of the medication. Discussed with patient how to obtain his refills and provided him with the phone number to [name] in case patient is down to a 2 week supply and has not heard from them. Patient verbalized understanding in how to obtain his refills. Confirmed patient had my name and number for future reference if any issues arise with patient assistance. Patient verbalized he had no other questions or concerns relating to patient assistance at this time.  Will route note to [facility] RPh [name] for case closure and remove myself from care team as patient assistance has been completed.  Bristol Osentoski P. Maize Brittingham, CPhT Musician Care Management [phone]

## 2018-05-27 ENCOUNTER — Telehealth: Payer: Self-pay

## 2018-05-28 ENCOUNTER — Telehealth: Payer: Self-pay

## 2018-05-28 ENCOUNTER — Ambulatory Visit: Payer: Self-pay | Admitting: Pharmacist

## 2018-05-28 DIAGNOSIS — E113293 Type 2 diabetes mellitus with mild nonproliferative diabetic retinopathy without macular edema, bilateral: Secondary | ICD-10-CM

## 2018-05-28 NOTE — Progress Notes (Signed)
This encounter was created in error - please disregard.

## 2018-05-29 ENCOUNTER — Telehealth: Payer: Self-pay

## 2018-05-29 NOTE — Patient Instructions (Signed)
Thank you allowing the Chronic Care Management Team to be a part of your care! It was a pleasure speaking with you today!     CCM (Chronic Care Management) Team    Janci Minor RN, BSN Nurse Care Coordinator  [phone]   [name] PharmD  Clinical Pharmacist  [phone]   Dickie La LCSW Clinical Social Worker [phone]  Visit Information  Goals Addressed            This Visit's Progress   . COMPLETED: "I want to get my A1C down"       Current Barriers:  [name] Medication Adherence  . Financial Barriers . Knowledge deficits related to diet . Lack of blood sugar results for clinic team  Pharmacist Clinical Goal(s):  [name] Over the next 30 days, patient will work with PharmD to address needs related to address needs related to diabetes management.   Over the next 30 days, patient will demonstrate Improved medication adherence as evidenced by patient report of adherence  Interventions:  Counsel on importance of medication adherence and continuing to use weekly pillbox as tool to aid with adherence.  Patient reports that he will get a new weekly pillbox for his evening medications.  Counsel patient on importance of checking his blood sugar daily as directed and keeping log . Collaborate with [facility] CPhT to assist patient with patient assistance application for Trulicity o Per notes from [facility] CPhT, patient has been approved for assistance program and patient reports that he has called to schedule delivery of the medication to his home.  Patient Self Care Activities:  . Self administers all medications as prescribed . Currently unable to consistently check blood sugars daily and keep log. Date Fasting Blood Glucose Before Lunch After Lunch Before Supper Bedtime  23 - April   103    24 - April       25 - April       26 - April 137      27 - April    72   28 - April  75     29 - April 113  114    30 - April     78  1 - May 120       . Calls provider office  for new concerns or questions, particularly if he has future low blood sugars. . Patient to eat regular meals  Please see past updates related to this goal by clicking on the "Past Updates" button in the selected goal     . I want to get my A1C down (pt-stated)       Current Barriers:  [name] Medication Adherence  . Financial Barriers . Knowledge deficits related to diet  Pharmacist Clinical Goal(s):  [name] Over the next 30 days, patient will continue to work with PharmD to address needs related to address needs related to diabetes management.   Over the next 30 days, patient will demonstrate improved medication adherence as evidenced by patient report of adherence  Interventions:  Perform chart review:   During visit with PCP on 05/25/18, provider discontinued patient's glimepiride due to risk of hypoglycemia.  Counsel on importance of medication adherence and continuing to use weekly pillbox as tool to aid with adherence.  Patient reports having continued to take his glimepiride, despite being advised by PCP to stop.  Patient reports a low blood sugar yesterday evening before supper (59 mg/dL)  Counsel patient about importance of following PCP direction to stop glimepiride. Patient  verbalizes understanding and states that he has now removed the glimepiride from his pillbox.  Patient reports that he obtained a new weekly pillbox for his evening medications.  Reports that he likes using the pillboxes, "I think that they really help me"  Reiterate with patient how to prevent and manage low blood sugars.  Counsel on importance of eating regular balanced meals.  Counsel patient on importance of checking his blood sugar daily as directed and keeping log  Patient Self Care Activities:  . Self administers all medications as prescribed . Currently unable to consistently check blood sugars daily and keep log. Date Fasting Blood Glucose After Lunch Before Supper  4 - May 118 102   5 - May  117 78   6 - May 117  59*  7 - May 145 115    . Calls provider office for new concerns or questions. . Patient to eat regular meals  Initial goal documentation        The patient verbalized understanding of instructions provided today and declined a print copy of patient instruction materials.   The CM team will reach out to the patient again over the next 14 days.   Duanne MoronElisabeth Pam Vanalstine, PharmD, [facility]ACP Clinical Pharmacist [facility] [phone]

## 2018-05-29 NOTE — Chronic Care Management (AMB) (Addendum)
  Chronic Care Management   Follow Up Note   05/29/2018 Name: [name]. MRN: [id number] DOB: Sep 27, 1945  Referred by: [name], DO Reason for referral : Chronic Care Management (Patient Phone Call)   [name]. is a 73 y.o. year old male who is a primary care patient of [name], DO. The CCM team was consulted for assistance with chronic disease management and care coordination needs.  Mr. [name] has a past medical history including but not limited to: hypertension, type 2 diabetes and hyperlipidemia  I reached out to [name] by phone today regarding medication adherence and diabetes management.  Review of patient status, including review of consultants reports, relevant laboratory and other test results, and collaboration with appropriate care team members and the patient's provider was performed as part of comprehensive patient evaluation and provision of chronic care management services.    Goals Addressed    . I want to get my A1C down (pt-stated)       Current Barriers:  [name] Medication Adherence  . Financial Barriers . Knowledge deficits related to diet  Pharmacist Clinical Goal(s):  [name] Over the next 30 days, patient will continue to work with PharmD to address needs related to address needs related to diabetes management.   Over the next 30 days, patient will demonstrate improved medication adherence as evidenced by patient report of adherence  Interventions:  Perform chart review:   During visit with PCP on 05/25/18, provider discontinued patient's glimepiride due to risk of hypoglycemia.  Counsel on importance of medication adherence and continuing to use weekly pillbox as tool to aid with adherence.  Patient reports having continued to take his glimepiride, despite being advised by PCP to stop.  Patient reports a low blood sugar yesterday evening before supper (59 mg/dL)  Counsel patient about importance of following PCP  direction to stop glimepiride. Patient verbalizes understanding and states that he has now removed the glimepiride from his pillbox.  Patient reports that he obtained a new weekly pillbox for his evening medications.  Reports that he likes using the pillboxes, "I think that they really help me"  Reiterate with patient how to prevent and manage low blood sugars.  Counsel on importance of eating regular balanced meals.  Counsel patient on importance of checking his blood sugar daily as directed and keeping log  Patient Self Care Activities:  . Self administers all medications as prescribed . Currently unable to consistently check blood sugars daily and keep log. Date Fasting Blood Glucose After Lunch Before Supper  4 - May 118 102   5 - May 117 78   6 - May 117  59*  7 - May 145 115    . Calls provider office for new concerns or questions. . Patient to eat regular meals  Initial goal documentation        Plan  The CM team will reach out to the patient again over the next 14 days.   [name], PharmD, [facility] Clinical Pharmacist [facility] [employer] [phone]

## 2018-05-31 ENCOUNTER — Other Ambulatory Visit: Payer: Self-pay | Admitting: Family Medicine

## 2018-05-31 DIAGNOSIS — IMO0001 Reserved for inherently not codable concepts without codable children: Secondary | ICD-10-CM

## 2018-06-01 ENCOUNTER — Other Ambulatory Visit: Payer: Self-pay | Admitting: Family Medicine

## 2018-06-01 ENCOUNTER — Telehealth: Payer: Self-pay

## 2018-06-01 DIAGNOSIS — IMO0001 Reserved for inherently not codable concepts without codable children: Secondary | ICD-10-CM

## 2018-06-02 ENCOUNTER — Other Ambulatory Visit: Payer: Self-pay | Admitting: Family Medicine

## 2018-06-02 DIAGNOSIS — IMO0001 Reserved for inherently not codable concepts without codable children: Secondary | ICD-10-CM

## 2018-06-02 MED ORDER — METFORMIN HCL 1000 MG PO TABS: ORAL_TABLET | ORAL | 3 refills | Status: DC

## 2018-06-02 NOTE — Telephone Encounter (Signed)
Pt  Called requesting refill on metformin called into  [facility]

## 2018-06-04 ENCOUNTER — Other Ambulatory Visit: Payer: Self-pay | Admitting: Pharmacy Technician

## 2018-06-04 ENCOUNTER — Ambulatory Visit: Payer: Self-pay | Admitting: Pharmacist

## 2018-06-04 DIAGNOSIS — E113293 Type 2 diabetes mellitus with mild nonproliferative diabetic retinopathy without macular edema, bilateral: Secondary | ICD-10-CM

## 2018-06-04 NOTE — Patient Outreach (Signed)
[facility]) Care Management  06/04/2018  [name]. 1946/01/12 [id number]    Incoming call received from patient in regards to his blood sugar reading.  Spoke to patient, HIPAA identifiers verified.  Patient called to inform that to him his blood sugar readings have been running high since stopping the Glimepiride. He reported that in the morning before breakfast his blood sugar the past 2 mornings have been 141, 155 and the afternoon readings have been 136, 155. He informed he would like to discuss this with someone. He informed that he would be attending the funeral of his brother at 1230pm today. He informed his brother passed away from COVID19.  Will reach out to [facility] and also inbasket her this note to inquire if she could reach out to the patient.  [name], CPhT Musician Care Management [phone]

## 2018-06-04 NOTE — Chronic Care Management (AMB) (Signed)
  Chronic Care Management   Note  06/04/2018 Name: [name]. MRN: [id number] DOB: 1945/12/17   Receive call from [facility] CPhT Noreene Larsson [place] letting me know that she spoke with [name] today and patient requested a return call regarding his blood sugar readings running high since stopping his glimepiride.  Outreach telephone call to [name]. Leave a HIPAA compliant voicemail asking patient to return my call.  Receive a call back from [name]. Patient reports that he is not home now to review his blood sugars, but that his readings have been running in the 140s to 150s. Schedule telephone appointment for tomorrow to follow up with patient regarding diabetes management.  Follow up plan: Telephone follow up appointment with CM Pharmacist scheduled for: 5/15 at 3 pm.   [name], PharmD, [facility] Clinical Pharmacist [facility] [phone]

## 2018-06-05 ENCOUNTER — Ambulatory Visit: Payer: Self-pay | Admitting: Pharmacist

## 2018-06-05 DIAGNOSIS — E113293 Type 2 diabetes mellitus with mild nonproliferative diabetic retinopathy without macular edema, bilateral: Secondary | ICD-10-CM

## 2018-06-05 NOTE — Chronic Care Management (AMB) (Signed)
Chronic Care Management   Follow Up Note   06/05/2018 Name: Christopher BruinsRobert Bosque Jr. MRN: [id number] DOB: 1945-01-23  Referred by: [name], Alexander J, DO Reason for referral : No chief complaint on file.   Christopher BruinsRobert Samford Jr. is a 73 y.o. year old male who is a primary care patient of [name], Alexander J, DO. The CCM team was consulted for assistance with chronic disease management and care coordination needs.  [name] has a past medical history including but not limited to: hypertension, type 2 diabetes and hyperlipidemia  I reached out to Christopher Bruinsobert Ouellet Jr. by phone today as scheduled to discuss his concerns about his blood sugar.  Review of patient status, including review of consultants reports, relevant laboratory and other test results, and collaboration with appropriate care team members and the patient's provider was performed as part of comprehensive patient evaluation and provision of chronic care management services.    Goals Addressed            This Visit's Progress    I want to get my A1C down (pt-stated)       Current Barriers:   Medication Adherence   Financial Barriers  Knowledge deficits related to diet  Pharmacist Clinical Goal(s):   Over the next 30 days, patient will continue to work with PharmD to address needs related to address needs related to diabetes management.   Over the next 30 days, patient will demonstrate improved medication adherence as evidenced by patient report of adherence  Interventions:    Inquire about patient's recent blood sugar results (see below)  [name] expresses concern about recent fasting blood sugars being higher than he was used to (141-162 mg/dL) prior to stopping glimepiride.  Counsel on importance of medication adherence and continuing to use weekly pillbox as tool to aid with adherence.  Counsel patient on the importance of taking his medication only as directed. Not restarting glimepiride as this was  discontinued by PCP due to risk of hypoglycemia  Counsel on importance of eating regular balanced carb modified diet to help with blood sugar control  Note that patient is working with CM Nurse Case Manager on adherence to ADA/ carb modified diet  Confirm patient's email address and send patient the following Emmi education:  Diabetes: Nutrition and Health Eating (video)  Diabetes Diet (handout)  Counsel patient on importance of checking his blood sugar daily as directed and keeping log  Will mail patient a blood sugar log to help him keep accurate track of his blood sugars.  Patient reports running low on testing strips. Review pharmacy claim data for test strip prescription filled on 05/07/18. Filled for #100 (50 day supply).  Advise patient to search for second vial of test strips from box. Patient is able to locate.  Remind [facility] to [facility] to confirm that mail order will ship next supply of test strips and lancets to patient at $0 copayment.  Patient Self Care Activities:   Self administers all medications as prescribed  To check blood sugars daily to twice daily and keep log.  To contact pharmacy to order refills of medication o [facility] weeks to confirm shipment of testing supplies Date Fasting Blood Glucose Before Lunch Before Supper  10 - May   114  11 - May  136   12 - May 154 151   13 - May 141    14 -May 155  15- May 162      Calls provider office for new concerns or questions.  Patient to eat regular meals  Please see past updates related to this goal by clicking on the "Past Updates" button in the selected goal         Plan  The CM team will reach out to the patient again over the next 7 days.   [name], PharmD, [facility] Clinical Pharmacist [facility] [employer] [phone]

## 2018-06-05 NOTE — Patient Instructions (Signed)
Thank you allowing the Chronic Care Management Team to be a part of your care! It was a pleasure speaking with you today!     CCM (Chronic Care Management) Team    Christopher Minor RN, BSN Nurse Care Coordinator  [phone]   Duanne MoronElisabeth Jonatha Mclaughlin PharmD  Clinical Pharmacist  [phone]   Dickie LaBrooke Joyce LCSW Clinical Social Worker [phone]  Visit Information  Goals Addressed            This Visit's Progress   . I want to get my A1C down (pt-stated)       Current Barriers:  [name]. Medication Adherence  . Financial Barriers . Knowledge deficits related to diet  Pharmacist Clinical Goal(s):  [name]. Over the next 30 days, patient will continue to work with PharmD to address needs related to address needs related to diabetes management.   Over the next 30 days, patient will demonstrate improved medication adherence as evidenced by patient report of adherence  Interventions:    Inquire about patient's recent blood sugar results (see below)  [name] expresses concern about recent fasting blood sugars being higher than he was used to (141-162 mg/dL) prior to stopping glimepiride.  Counsel on importance of medication adherence and continuing to use weekly pillbox as tool to aid with adherence.  Counsel patient on the importance of taking his medication only as directed. Not restarting glimepiride as this was discontinued by PCP due to risk of hypoglycemia  Counsel on importance of eating regular balanced carb modified diet to help with blood sugar control  Note that patient is working with CM Nurse Case Manager on adherence to ADA/ carb modified diet  Confirm patient's email address and send patient the following Emmi education:  Diabetes: Nutrition and Health Eating (video)  Diabetes Diet (handout)  Counsel patient on importance of checking his blood sugar daily as directed and keeping log  Will mail patient a blood sugar log to help him keep accurate track of his blood  sugars.  Patient reports running low on testing strips. Review pharmacy claim data for test strip prescription filled on 05/07/18. Filled for #100 (50 day supply).  Advise patient to search for second vial of test strips from box. Patient is able to locate.  Remind patient to [facility] in 2 weeks to confirm that mail order will ship next supply of test strips and lancets to patient at $0 copayment.  Patient Self Care Activities:  . Self administers all medications as prescribed . To check blood sugars daily to twice daily and keep log. . To contact pharmacy to order refills of medication o [facility] weeks to confirm shipment of testing supplies Date Fasting Blood Glucose Before Lunch Before Supper  10 - May   114  11 - May  136   12 - May 154 151   13 - May 141    14 -May 155    15- May 162     . Calls provider office for new concerns or questions. . Patient to eat regular meals  Please see past updates related to this goal by clicking on the "Past Updates" button in the selected goal         The patient verbalized understanding of instructions provided today and declined a print copy of patient instruction materials.   The CM team will reach out to the patient again over the next 7 days.   Gentry FitzElisabeth  De Nurse, PharmD, [facility] Clinical Pharmacist [facility] [employer] [phone]

## 2018-06-08 ENCOUNTER — Telehealth: Payer: Self-pay

## 2018-06-11 ENCOUNTER — Telehealth: Payer: Self-pay

## 2018-06-17 ENCOUNTER — Ambulatory Visit (INDEPENDENT_AMBULATORY_CARE_PROVIDER_SITE_OTHER): Payer: Medicare HMO | Admitting: Pharmacist

## 2018-06-17 DIAGNOSIS — I10 Essential (primary) hypertension: Secondary | ICD-10-CM

## 2018-06-17 DIAGNOSIS — E113293 Type 2 diabetes mellitus with mild nonproliferative diabetic retinopathy without macular edema, bilateral: Secondary | ICD-10-CM

## 2018-06-17 NOTE — Chronic Care Management (AMB) (Signed)
Chronic Care Management   Follow Up Note   06/17/2018 Name: Christopher BruinsRobert Rathert Jr. MRN: [id number] DOB: April 27, 1945  Referred by: [name], Alexander J, DO Reason for referral : Chronic Care Management (Patient Phone Call)   Christopher BruinsRobert Steppe Jr. is a 73 y.o. year old male who is a primary care patient of [name], Alexander J, DO. The CCM team was consulted for assistance with chronic disease management and care coordination needs.  [name] has a past medical history including but not limited to: hypertension, type 2 diabetes and hyperlipidemia  I reached out to [name] by phone today regarding medication adherence and diabetes management.  Review of patient status, including review of consultants reports, relevant laboratory and other test results, and collaboration with appropriate care team members and the patient's provider was performed as part of comprehensive patient evaluation and provision of chronic care management services.    Goals Addressed            This Visit's Progress   . I want to get my A1C down (pt-stated)       Current Barriers:  [name]. Medication Adherence  . Financial Barriers . Knowledge deficits related to diet  Pharmacist Clinical Goal(s):  [name]. Over the next 30 days, patient will continue to work with PharmD to address needs related to address needs related to diabetes management.  . Over the next 30 days, patient will demonstrate improved medication adherence as evidenced by patient report of adherence  Interventions:    Inquire about patient's recent blood sugar results (see below)  [name] expresses concern about blood sugars being higher than he was used to prior to stopping glimepiride.  Counsel on importance of medication adherence and continuing to use weekly pillbox as tool to aid with adherence.  Counsel on importance of eating regular balanced carb modified diet to help with blood sugar control  Note that patient is working with CM Nurse  Case Manager on adherence to ADA/ carb modified diet  Patient reports that he has not yet viewed the following Emmi education. Advise patient that this education is in his email. Patient states that he will locate and review this material  Diabetes: Nutrition and Healthy Eating (video)  Diabetes Diet (handout)  Counsel patient on importance of checking his blood sugar daily as directed and keeping log  Reports difficulty with focusing on his own health over the past few weeks due to the loss of several family members including his brother and his wife. Offer patient support. [name] expresses that the loss has been difficult for him, but that he is doing [place].  Reports using blood sugar log that I mailed him to help him keep accurate track of his blood sugars.  Patient Self Care Activities:  . Self administers all medications as prescribed . To check blood sugars daily to twice daily and keep log. Date Fasting Blood Glucose Before Supper After Supper  20 - May 153  157  21- May     22 - May 154    23 - May 144  158  24 - May     25 - May     26 - May  132   27 - May -     . To contact pharmacy to order refills of medication o Patient to call [facility] Mail order pharmacy to confirm shipment of testing supplies . Calls provider office for new concerns or questions. . Patient to eat regular meals  Please see past updates  related to this goal by clicking on the "Past Updates" button in the selected goal         Plan  CM Pharmacist will reach out to the patient again over the next 14 days.   Duanne Mclaughlin, PharmD, [facility] Clinical Pharmacist [facility] [employer] [phone]

## 2018-06-17 NOTE — Patient Instructions (Signed)
Thank you allowing the Chronic Care Management Team to be a part of your care! It was a pleasure speaking with you today!     CCM (Chronic Care Management) Team    Janci Minor RN, BSN Nurse Care Coordinator  [phone]   [name] PharmD  Clinical Pharmacist  [phone]   Dickie La LCSW Clinical Social Worker [phone]  Visit Information  Goals Addressed            This Visit's Progress   . I want to get my A1C down (pt-stated)       Current Barriers:  [name] Medication Adherence  . Financial Barriers . Knowledge deficits related to diet  Pharmacist Clinical Goal(s):  [name] Over the next 30 days, patient will continue to work with PharmD to address needs related to address needs related to diabetes management.  . Over the next 30 days, patient will demonstrate improved medication adherence as evidenced by patient report of adherence  Interventions:    Inquire about patient's recent blood sugar results (see below)  Mr. Shia expresses concern about blood sugars being higher than he was used to prior to stopping glimepiride.  Counsel on importance of medication adherence and continuing to use weekly pillbox as tool to aid with adherence.  Counsel on importance of eating regular balanced carb modified diet to help with blood sugar control  Note that patient is working with CM Nurse Case Manager on adherence to ADA/ carb modified diet  Patient reports that he has not yet viewed the following Emmi education. Advise patient that this education is in his email. Patient states that he will locate and review this material  Diabetes: Nutrition and Healthy Eating (video)  Diabetes Diet (handout)  Counsel patient on importance of checking his blood sugar daily as directed and keeping log  Reports using blood sugar log that I mailed him to help him keep accurate track of his blood sugars.  Patient Self Care Activities:  . Self administers all medications as  prescribed . To check blood sugars daily to twice daily and keep log. Date Fasting Blood Glucose Before Supper After Supper  20 - May 153  157  21- May     22 - May 154    23 - May 144  158  24 - May     25 - May     26 - May  132   27 - May -     . To contact pharmacy to order refills of medication o Patient to call [facility] order pharmacy to confirm shipment of testing supplies . Calls provider office for new concerns or questions. . Patient to eat regular meals  Please see past updates related to this goal by clicking on the "Past Updates" button in the selected goal         The patient verbalized understanding of instructions provided today and declined a print copy of patient instruction materials.   The CM team will reach out to the patient again over the next 14 days.   [name], PharmD, [facility] Clinical Pharmacist [facility] [employer] [phone]

## 2018-06-25 ENCOUNTER — Other Ambulatory Visit: Payer: Self-pay | Admitting: Nurse Practitioner

## 2018-07-01 ENCOUNTER — Ambulatory Visit: Payer: Medicare HMO | Admitting: Pharmacist

## 2018-07-01 ENCOUNTER — Telehealth: Payer: Self-pay | Admitting: Family Medicine

## 2018-07-01 DIAGNOSIS — E113293 Type 2 diabetes mellitus with mild nonproliferative diabetic retinopathy without macular edema, bilateral: Secondary | ICD-10-CM

## 2018-07-01 MED ORDER — GLIPIZIDE 5 MG PO TABS: 2.5000 mg | ORAL_TABLET | Freq: Every day | ORAL | 1 refills | Status: DC

## 2018-07-01 NOTE — Patient Instructions (Signed)
Thank you allowing the Chronic Care Management Team to be a part of your care! It was a pleasure speaking with you today!     CCM (Chronic Care Management) Team    Janci Minor RN, BSN Nurse Care Coordinator  [phone]   Harlow Asa PharmD  Clinical Pharmacist  [phone]   Eula Fried LCSW Clinical Social Worker [phone]  Visit Information  Goals Addressed            This Visit's Progress   . I want to get my A1C down (pt-stated)       Current Barriers:  [name] Medication Adherence  . Financial Barriers . Knowledge deficits related to diet  Pharmacist Clinical Goal(s):  [name] Over the next 30 days, patient will continue to work with PharmD to address needs related to address needs related to diabetes management.  . Over the next 30 days, patient will demonstrate improved medication adherence as evidenced by patient report of adherence  Interventions:    Inquire about patient's recent blood sugar results (see below)  Mr. Hamon expresses concern about blood sugars being higher than he was used to prior to stopping glimepiride.  Counsel on importance of medication adherence and continuing to use weekly pillbox as tool to aid with adherence.  Mr. Codispoti denies missing doses of his morning medications or Trulicity and states that he rarely misses a dose of his evening metformin  Counsel on importance of eating regular balanced carb modified diet to help with blood sugar control  Note that patient is working with CM Nurse Case Manager on adherence to ADA/ carb modified diet  Patient reports that he has not yet viewed the following Emmi education. Let Mr. Negron know that I will resend this material:  Diabetes: Nutrition and Healthy Eating (video)  Diabetes Diet (handout)  Patient Self Care Activities:  . Self administers all medications as prescribed . To check blood sugars daily to twice daily and keep log. Date Fasting Blood Glucose Before Supper  4 -  June 158 162  5 - June 143 138  6 - June 146   7 - June 155 164  8 - June 135 162  9 - June 145   10 - June 146   Average 147 156   . To contact pharmacy to order refills of medication o Patient to call [facility] Mail order pharmacy to confirm shipment of testing supplies; provide patient with phone number . Calls provider office for new concerns or questions. . Patient to eat regular meals  Please see past updates related to this goal by clicking on the "Past Updates" button in the selected goal         The patient verbalized understanding of instructions provided today and declined a print copy of patient instruction materials.   The care management team will reach out to the patient again over the next 7 days.   Harlow Asa, PharmD, [place] [employer] [phone]

## 2018-07-01 NOTE — Chronic Care Management (AMB) (Signed)
  Chronic Care Management   Follow Up Note   07/01/2018 Name: [name]. MRN: [id number] DOB: [birth year]  Referred by: [name] Reason for referral : Chronic Care Management (Patient Phone Call)   [name]. is a 73 y.o. year old male who is a primary care patient of [name]. The CCM team was consulted for assistance with chronic disease management and care coordination needs.  [name] has a past medical history including but not limited to: hypertension, type 2 diabetes and hyperlipidemia  I reached out to [name] by phone today regarding medication adherence and diabetes management.  Review of patient status, including review of consultants reports, relevant laboratory and other test results, and collaboration with appropriate care team members and the patient's provider was performed as part of comprehensive patient evaluation and provision of chronic care management services.    Goals Addressed            This Visit's Progress   . I want to get my A1C down (pt-stated)       Current Barriers:  [name] Medication Adherence  . Financial Barriers . Knowledge deficits related to diet  Pharmacist Clinical Goal(s):  [name] Over the next 30 days, patient will continue to work with PharmD to address needs related to address needs related to diabetes management.  . Over the next 30 days, patient will demonstrate improved medication adherence as evidenced by patient report of adherence  Interventions:    Inquire about patient's recent blood sugar results (see below)  [name] expresses concern about blood sugars being higher than he was used to prior to stopping glimepiride.  Counsel on importance of medication adherence and continuing to use weekly pillbox as tool to aid with adherence.  [name] denies missing doses of his morning medications or Trulicity and states that he rarely misses a dose of his evening metformin  Counsel on  importance of eating regular balanced carb modified diet to help with blood sugar control  Note that patient is working with CM Nurse Case Manager on adherence to ADA/ carb modified diet  Patient reports that he has not yet viewed the following Emmi education. Let [name] know that I will resend this material:  Diabetes: Nutrition and Healthy Eating (video)  Diabetes Diet (handout)  Patient Self Care Activities:  . Self administers all medications as prescribed . To check blood sugars daily to twice daily and keep log. Date Fasting Blood Glucose Before Supper  4 - June 158 162  5 - June 143 138  6 - June 146   7 - June 155 164  8 - June 135 162  9 - June 145   10 - June 146   Average 147 156   . To contact [facility] to order refills of medication o Patient to call [name] to confirm shipment of testing supplies; provide patient with phone number . Calls provider office for new concerns or questions. . Patient to eat regular meals  Please see past updates related to this goal by clicking on the "Past Updates" button in the selected goal         Plan  The care management team will reach out to the patient again over the next 7 days.  Follow up with provider re: patient's blood glucose numbers  [name], PharmD, [place] [employer] [phone]

## 2018-07-01 NOTE — Telephone Encounter (Signed)
-----   Message from [name], [facility] sent at 07/01/2018  4:49 PM EDT ----- Dr. Parks Ranger,  Mr. Boomhower is reporting good adherence to his Trulicity 1.5 mg weekly and metformin 1000 mg twice weekly. Over the past week, his fasting blood sugar average was 147 (range of 135-158 mg/dL). He is interested in getting his blood sugar under tighter control. You discontinued his glimepiride 4 mg QAM on 5/4 due to his lower fasting range at the time that had included a couple of lows.   Based on his reported income, Mr. Tooker would not qualify for the patient assistance programs for the SGLT2-inhibitors, due to lower income limits. Mr. Conover states that he would afford the tier 3 copayment if he needed to take a brand name medication, but cost remains a concern. Would you consider adding glipizide 2.5 mg once daily to his regimen, as it is a shorter acting sulfonylurea/safer option within the class?   Thank you!  Grayland Ormond

## 2018-07-01 NOTE — Telephone Encounter (Signed)
Agree with recommendations. Will add back sulfonylurea, now lower dose Glipizide 2.5mg  daily with meal, for shorter acting agent. Improve glucose control and avoid high copay for him.  Nobie Putnam, [facility] 07/01/2018, 5:51 PM

## 2018-07-02 ENCOUNTER — Ambulatory Visit: Payer: Self-pay | Admitting: Pharmacist

## 2018-07-02 DIAGNOSIS — E113293 Type 2 diabetes mellitus with mild nonproliferative diabetic retinopathy without macular edema, bilateral: Secondary | ICD-10-CM

## 2018-07-02 NOTE — Chronic Care Management (AMB) (Signed)
  Chronic Care Management   Follow Up Note   07/02/2018 Name: [name]. MRN: [id number] DOB: 11/08/1945  Referred by: [name], DO Reason for referral : Chronic Care Management (Patient Phone Call)   [name]. is a 73 y.o. year old male who is a primary care patient of [name], DO. The CCM team was consulted for assistance with chronic disease management and care coordination needs.  [name] has a past medical history including but not limited to: hypertension, type 2 diabetes and hyperlipidemia  I reached out to [name] by phone today for follow up regarding diabetes medication management.  Review of patient status, including review of consultants reports, relevant laboratory and other test results, and collaboration with appropriate care team members and the patient's provider was performed as part of comprehensive patient evaluation and provision of chronic care management services.    Goals Addressed            This Visit's Progress   . I want to get my A1C down (pt-stated)       Current Barriers:  [name] Medication Adherence  . Financial Barriers . Knowledge deficits related to diet  Pharmacist Clinical Goal(s):  [name] Over the next 30 days, patient will continue to work with PharmD to address needs related to address needs related to diabetes management.  . Over the next 30 days, patient will demonstrate improved medication adherence as evidenced by patient report of adherence  Interventions:    Collaborate with PCP regarding patient's recent blood sugar results and recommend addition of glipizide 2.5 mg once daily 30 minutes before breakfast. Dr. Parks Ranger agrees with this addition and sends prescription to patient's [place].  Call to follow up with patient to counsel on new start of glipizide 2.5 mg - Counsel patient on name, dose, administration and monitoring for glipizide. Confirm that patient has pill cutter for  splitting tablet for 2.5 mg dose.  Counsel on importance of medication adherence and continuing to use weekly pillbox as tool to aid with adherence.  Patient Self Care Activities:  . Self administers all medications as prescribed . To check blood sugars daily to twice daily and keep log. Date Fasting Blood Glucose Before Supper  4 - June 158 162  5 - June 143 138  6 - June 146   7 - June 155 164  8 - June 135 162  9 - June 145   10 - June 146   11 - June 146    . To contact pharmacy to order refills of medication o Patient to call [facility] order pharmacy to confirm shipment of testing supplies . Calls provider office for new concerns or questions. . Patient to eat regular meals  Please see past updates related to this goal by clicking on the "Past Updates" button in the selected goal         Plan  The care management team will reach out to the patient again over the next 7 days.   Harlow Asa, PharmD, [place] [employer] [phone]

## 2018-07-02 NOTE — Patient Instructions (Signed)
Thank you allowing the Chronic Care Management Team to be a part of your care! It was a pleasure speaking with you today!     CCM (Chronic Care Management) Team    Janci Minor RN, BSN Nurse Care Coordinator  [phone]   Harlow Asa PharmD  Clinical Pharmacist  [phone]   Eula Fried LCSW Clinical Social Worker [phone]  Visit Information  Goals Addressed            This Visit's Progress   . I want to get my A1C down (pt-stated)       Current Barriers:  [name] Medication Adherence  . Financial Barriers . Knowledge deficits related to diet  Pharmacist Clinical Goal(s):  [name] Over the next 30 days, patient will continue to work with PharmD to address needs related to address needs related to diabetes management.  . Over the next 30 days, patient will demonstrate improved medication adherence as evidenced by patient report of adherence  Interventions:    Collaborate with PCP regarding patient's recent blood sugar results and recommend addition of glipizide 2.5 mg once daily 30 minutes before breakfast. Dr. Parks Ranger agrees with this addition and sends prescription to patient's [facility].  Call to follow up with patient to counsel on new start of glipizide 2.5 mg - Counsel patient on name, dose, administration and monitoring for glipizide. Confirm that patient has pill cutter for splitting tablet for 2.5 mg dose.  Counsel on importance of medication adherence and continuing to use weekly pillbox as tool to aid with adherence.  Patient Self Care Activities:  . Self administers all medications as prescribed . To check blood sugars daily to twice daily and keep log. Date Fasting Blood Glucose Before Supper  4 - June 158 162  5 - June 143 138  6 - June 146   7 - June 155 164  8 - June 135 162  9 - June 145   10 - June 146   11 - June 146    . To contact pharmacy to order refills of medication o Patient to call [facility] Mail order pharmacy to  confirm shipment of testing supplies . Calls provider office for new concerns or questions. . Patient to eat regular meals  Please see past updates related to this goal by clicking on the "Past Updates" button in the selected goal         The patient verbalized understanding of instructions provided today and declined a print copy of patient instruction materials.   The care management team will reach out to the patient again over the next 7 days.   Harlow Asa, PharmD, [place] [employer] [phone]

## 2018-07-09 ENCOUNTER — Ambulatory Visit: Payer: Self-pay | Admitting: Pharmacist

## 2018-07-09 ENCOUNTER — Telehealth: Payer: Self-pay

## 2018-07-09 DIAGNOSIS — E113293 Type 2 diabetes mellitus with mild nonproliferative diabetic retinopathy without macular edema, bilateral: Secondary | ICD-10-CM

## 2018-07-09 NOTE — Chronic Care Management (AMB) (Signed)
  Chronic Care Management   Follow Up Note   07/09/2018 Name: [name]. MRN: [id number] DOB: 1945/02/19  Referred by: [name], DO Reason for referral : Chronic Care Management (Patient Phone Call)   [name]. is a 73 y.o. year old male who is a primary care patient of [name], DO. The CCM team was consulted for assistance with chronic disease management and care coordination needs.  [name] has a past medical history including but not limited to: hypertension, type 2 diabetes and hyperlipidemia  I reached out to [name] by phone today for follow up regarding diabetes medication management.  Review of patient status, including review of consultants reports, relevant laboratory and other test results, and collaboration with appropriate care team members and the patient's provider was performed as part of comprehensive patient evaluation and provision of chronic care management services.    Goals Addressed            This Visit's Progress   . "I want to get my A1C down" (pt-stated)       Current Barriers:  [name] Medication Adherence  . Financial Barriers . Knowledge deficits related to diet  Pharmacist Clinical Goal(s):  [name] Over the next 30 days, patient will continue to work with PharmD to address needs related to address needs related to diabetes management.  . Over the next 30 days, patient will demonstrate improved medication adherence as evidenced by patient report of adherence  Interventions:    [name] confirms that he has been taking glipizide 2.5 mg once daily before breakfast as directed, in addition to his twice daily metformin and weekly Trulicity.   Confirms adding this medication to his weekly pillbox  Provide verbal education on blood sugar management and monitoring  Discuss importance of balanced diet for blood sugar control  [name] states that he has noticed an improvement in his blood sugar results (see  below)   Denies any low blood sugars  Reports that he did call [facility] to order his blood sugar testing supplies  Patient Self Care Activities:  . Self administers all medications as prescribed . To check blood sugars daily to twice daily and keep log. Date Fasting Blood Glucose Before Lunch Before Supper After Supper  12 - June   115   13 - June 147  97   14 - June  140  101  15 - June   141   16 - June      17 - June 134  120   18 - June 124      . To contact pharmacy to order refills of medication . Calls provider office for new concerns or questions. . Patient to eat regular meals  Please see past updates related to this goal by clicking on the "Past Updates" button in the selected goal         Plan  Telephone follow up appointment with care management team member scheduled for: 6/29 at 3 pm  Harlow Asa, PharmD, [place] [phone]

## 2018-07-09 NOTE — Patient Instructions (Signed)
Thank you allowing the Chronic Care Management Team to be a part of your care! It was a pleasure speaking with you today!     CCM (Chronic Care Management) Team    Janci Minor RN, BSN Nurse Care Coordinator  [phone]   Harlow Asa PharmD  Clinical Pharmacist  [phone]   Eula Fried LCSW Clinical Social Worker [phone]  Visit Information  Goals Addressed            This Visit's Progress   . "I want to get my A1C down" (pt-stated)       Current Barriers:  [name] Medication Adherence  . Financial Barriers . Knowledge deficits related to diet  Pharmacist Clinical Goal(s):  [name] Over the next 30 days, patient will continue to work with PharmD to address needs related to address needs related to diabetes management.  . Over the next 30 days, patient will demonstrate improved medication adherence as evidenced by patient report of adherence  Interventions:    Mr. Eaddy confirms that he has been taking glipizide 2.5 mg once daily before breakfast as directed, in addition to his twice daily metformin and weekly Trulicity.   Confirms adding this medication to his weekly pillbox  Provide verbal education on blood sugar management and monitoring  Discuss importance of balanced diet for blood sugar control  Mr. Carpenito states that he has noticed an improvement in his blood sugar results (see below)   Denies any low blood sugars  Reports that he did call [place] to order his blood sugar testing supplies  Patient Self Care Activities:  . Self administers all medications as prescribed . To check blood sugars daily to twice daily and keep log. Date Fasting Blood Glucose Before Lunch Before Supper After Supper  12 - June   115   13 - June 147  97   14 - June  140  101  15 - June   141   16 - June      17 - June 134  120   18 - June 124      . To contact pharmacy to order refills of medication . Calls provider office for new concerns or  questions. . Patient to eat regular meals  Please see past updates related to this goal by clicking on the "Past Updates" button in the selected goal         The patient verbalized understanding of instructions provided today and declined a print copy of patient instruction materials.   Telephone follow up appointment with care management team member scheduled for: 6/29 at 3 pm  Harlow Asa, PharmD, [place] [phone]

## 2018-07-13 ENCOUNTER — Telehealth: Payer: Self-pay

## 2018-07-16 ENCOUNTER — Telehealth: Payer: Self-pay

## 2018-07-20 ENCOUNTER — Ambulatory Visit (INDEPENDENT_AMBULATORY_CARE_PROVIDER_SITE_OTHER): Payer: Medicare HMO | Admitting: Pharmacist

## 2018-07-20 DIAGNOSIS — I10 Essential (primary) hypertension: Secondary | ICD-10-CM

## 2018-07-20 DIAGNOSIS — E113293 Type 2 diabetes mellitus with mild nonproliferative diabetic retinopathy without macular edema, bilateral: Secondary | ICD-10-CM

## 2018-07-21 ENCOUNTER — Other Ambulatory Visit: Payer: Self-pay | Admitting: Family Medicine

## 2018-07-21 DIAGNOSIS — E113293 Type 2 diabetes mellitus with mild nonproliferative diabetic retinopathy without macular edema, bilateral: Secondary | ICD-10-CM

## 2018-07-21 MED ORDER — ACCU-CHEK AVIVA PLUS VI STRP: ORAL_STRIP | 5 refills | Status: AC

## 2018-07-21 MED ORDER — ACCU-CHEK SOFTCLIX LANCETS MISC: 5 refills | Status: AC

## 2018-07-21 NOTE — Patient Instructions (Signed)
Thank you allowing the Chronic Care Management Team to be a part of your care! It was a pleasure speaking with you today!     CCM (Chronic Care Management) Team    Janci Minor RN, BSN Nurse Care Coordinator  [phone]   Harlow Asa PharmD  Clinical Pharmacist  [phone]   Eula Fried LCSW Clinical Social Worker [phone]  Visit Information  Goals Addressed            This Visit's Progress   . "I want to get my A1C down" (pt-stated)       Current Barriers:  [name] Medication Adherence  . Financial Barriers . Knowledge deficits related to diet  Pharmacist Clinical Goal(s):  [name] Over the next 30 days, patient will continue to work with PharmD to address needs related to address needs related to diabetes management.  . Over the next 30 days, patient will demonstrate improved medication adherence as evidenced by patient report of adherence  Interventions:    Mr. Hemme confirms that he has been taking glipizide 2.5 mg once daily before breakfast as directed, in addition to his twice daily metformin and weekly Trulicity.   Confirms using weekly pillbox  Provide verbal education on blood sugar management and monitoring  Mr. Bazinet states that he has noticed an improvement in his blood sugar results (see below)   Denies any low blood sugars  Reports that he ran out of test strips  Reports that he did call [place] [facility] to confirm shipment of testing supplies, but supplies never arrived   Advise patient to call [facility] today to follow up - reports [facility] representative denies supplies having shipped the supplies  Call [facility] for patient and pharmacy refills the test strips for patient today  Will collaborate with PCP to request he send prescriptions for Accu-chek Softclix lancets and Accu-chek Aviva Plus test strips to [facility] for patient for future refills  Patient Self Care Activities:  . Self administers  all medications as prescribed . To check blood sugars daily to twice daily and keep log. Date Fasting Blood Glucose Before Supper  23 - June - 129  24 - June 111 127  25 - June 119 109  26 - June 124 136  27 - June - -  28 - June - -   . To contact pharmacy to order refills of medication . Calls provider office for new concerns or questions. . Patient to eat regular meals  Please see past updates related to this goal by clicking on the "Past Updates" button in the selected goal         The patient verbalized understanding of instructions provided today and declined a print copy of patient instruction materials.   Telephone follow up appointment with care management team member scheduled for: 7/14 at 3 pm  Harlow Asa, PharmD, [place] [phone]

## 2018-07-21 NOTE — Chronic Care Management (AMB) (Signed)
  Chronic Care Management   Follow Up Note   07/21/2018 Name: [name]. MRN: [id number] DOB: 05-28-1945  Referred by: [name], DO Reason for referral : Chronic Care Management (Patient Phone Call) and Care Coordination [facility])   [name]. is a 73 y.o. year old male who is a primary care patient of [name], DO. The CCM team was consulted for assistance with chronic disease management and care coordination needs. [name] has a past medical history including but not limited to: hypertension, type 2 diabetes and hyperlipidemia  I reached out to [name] by phone today for follow up regarding diabetes medication management.  Review of patient status, including review of consultants reports, relevant laboratory and other test results, and collaboration with appropriate care team members and the patient's provider was performed as part of comprehensive patient evaluation and provision of chronic care management services.    Goals Addressed            This Visit's Progress   . "I want to get my A1C down" (pt-stated)       Current Barriers:  [name] Medication Adherence  . Financial Barriers . Knowledge deficits related to diet  Pharmacist Clinical Goal(s):  [name] Over the next 30 days, patient will continue to work with PharmD to address needs related to address needs related to diabetes management.  . Over the next 30 days, patient will demonstrate improved medication adherence as evidenced by patient report of adherence  Interventions:    [name] confirms that he has been taking glipizide 2.5 mg once daily before breakfast as directed, in addition to his twice daily metformin and weekly Trulicity.   Confirms using weekly pillbox  Provide verbal education on blood sugar management and monitoring  [name] states that he has noticed an improvement in his blood sugar results (see below)   Denies any low blood sugars   Reports that he ran out of test strips  Reports that he did call [name] to confirm shipment of testing supplies, but supplies never arrived   Advise patient to call [name] today to follow up - reports [name] representative denies supplies having shipped the supplies  Call [name] for patient and pharmacy refills the test strips for patient today  Will collaborate with PCP to request he send prescriptions for Accu-chek Softclix lancets and Accu-chek Aviva Plus test strips to [name] for patient for future refills  Patient Self Care Activities:  . Self administers all medications as prescribed . To check blood sugars daily to twice daily and keep log. Date Fasting Blood Glucose Before Supper  23 - June - 129  24 - June 111 127  25 - June 119 109  26 - June 124 136  27 - June - -  28 - June - -   . To contact pharmacy to order refills of medication . Calls provider office for new concerns or questions. . Patient to eat regular meals  Please see past updates related to this goal by clicking on the "Past Updates" button in the selected goal         Plan  Telephone follow up appointment with care management team member scheduled for: 08/04/18  [name], PharmD, [facility] [phone]

## 2018-07-29 ENCOUNTER — Other Ambulatory Visit: Payer: Self-pay | Admitting: Family Medicine

## 2018-07-29 DIAGNOSIS — E785 Hyperlipidemia, unspecified: Secondary | ICD-10-CM

## 2018-07-29 DIAGNOSIS — E1169 Type 2 diabetes mellitus with other specified complication: Secondary | ICD-10-CM

## 2018-08-02 ENCOUNTER — Other Ambulatory Visit: Payer: Self-pay | Admitting: Family Medicine

## 2018-08-02 DIAGNOSIS — E113293 Type 2 diabetes mellitus with mild nonproliferative diabetic retinopathy without macular edema, bilateral: Secondary | ICD-10-CM

## 2018-08-03 ENCOUNTER — Telehealth: Payer: Self-pay

## 2018-08-03 ENCOUNTER — Ambulatory Visit: Payer: Self-pay | Admitting: *Deleted

## 2018-08-03 DIAGNOSIS — E113293 Type 2 diabetes mellitus with mild nonproliferative diabetic retinopathy without macular edema, bilateral: Secondary | ICD-10-CM

## 2018-08-03 NOTE — Chronic Care Management (AMB) (Signed)
  Chronic Care Management   Outreach Note  08/03/2018 Name: [name]. MRN: [id number] DOB: [birth year]  Referred by: [name], DO Reason for referral : Chronic Care Management (Unsuccessful CCM Outreach )   An unsuccessful telephone outreach was attempted today. The patient was referred to the case management team by for assistance with chronic care management and care coordination.   Follow Up Plan: A HIPPA compliant phone message was left for the patient providing contact information and requesting a return call.   Merlene Morse Nyima Vanacker RN, BSN Nurse Case Pharmacist, [facility]  [phone]) Business Mobile

## 2018-08-04 ENCOUNTER — Ambulatory Visit: Payer: Medicare HMO | Admitting: Pharmacist

## 2018-08-04 DIAGNOSIS — I10 Essential (primary) hypertension: Secondary | ICD-10-CM

## 2018-08-04 DIAGNOSIS — E113293 Type 2 diabetes mellitus with mild nonproliferative diabetic retinopathy without macular edema, bilateral: Secondary | ICD-10-CM

## 2018-08-04 NOTE — Chronic Care Management (AMB) (Signed)
Chronic Care Management   Follow Up Note   08/04/2018 Name: [name]. MRN: [id number] DOB: 11/16/1945  Referred by: [name], DO Reason for referral : Chronic Care Management (Patient Phone Call)   [name]. is a 73 y.o. year old male who is a primary care patient of [name], DO. The CCM team was consulted for assistance with chronic disease management and care coordination needs.  Mr. Kerlin has a past medical history including but not limited to: hypertension, type 2 diabetes and hyperlipidemia  I reached out to [name] by phone today for follow up regarding diabetes medication management.  Review of patient status, including review of consultants reports, relevant laboratory and other test results, and collaboration with appropriate care team members and the patient's provider was performed as part of comprehensive patient evaluation and provision of chronic care management services.    Goals Addressed            This Visit's Progress   . "I want to get my A1C down" (pt-stated)       Current Barriers:  [name] Medication Adherence  . Financial Barriers . Knowledge deficits related to diet  Pharmacist Clinical Goal(s):  [name] Over the next 30 days, patient will continue to work with PharmD to address needs related to address needs related to diabetes management.  . Over the next 30 days, patient will demonstrate improved medication adherence as evidenced by patient report of adherence  Interventions:    Counsel on importance of medication adherence - Mr. Rother reports that he has been continuing to use his weekly pillbox and injecting his Trulicity on Tuesdays; denies any missed doses  Provide verbal education on blood sugar management and monitoring  Reports that his blood sugar has been good (see reported numbers below  Denies any low blood sugars  Counsel patient again about the [employer] Over the Counter (OTC) benefit  Call  [employer] OTC benefit with patient on the line. Speak with representative Alex.   Mr. Nuttall places order for upper arm blood pressure monitor and catalog to be mailed to patient (Confirmation # [id number])  Mr. Wortmann reports having two weeks of Trulicity remaining. Counsel patient to call [facility] pharmacy today to reorder refill from patient assistance program. Provide patient with phone number again.  Patient Self Care Activities:  . Self administers all medications as prescribed . To check blood sugars daily to twice daily and keep log. Date Fasting Blood Glucose Before Supper  7 - July 119 103  8 - July 114 122  9 - July 126 133  10 - July 118 -  11 - July - -  12 - July - -  13 - July 134 -  14 - July 125   Average 122    . To contact pharmacy to order refills of medication o To call [facility] (dispensing pharmacy for [employer] patient assistance program) for refill of Trulicity today . Calls provider office for new concerns or questions. . Patient to eat regular meals  Please see past updates related to this goal by clicking on the "Past Updates" button in the selected goal         Plan  The care management team will reach out to the patient again over the next 30 days.  The patient has been provided with contact information for the care management team and has been advised to call with any health related questions or concerns.   Grayland Ormond  [name], PharmD, [facility] [employer] [phone]

## 2018-08-04 NOTE — Patient Instructions (Signed)
Thank you allowing the Chronic Care Management Team to be a part of your care! It was a pleasure speaking with you today!     CCM (Chronic Care Management) Team    Janci Minor RN, BSN Nurse Care Coordinator  [phone]   Harlow Asa PharmD  Clinical Pharmacist  [phone]   Eula Fried LCSW Clinical Social Worker [phone]  Visit Information  Goals Addressed            This Visit's Progress   . "I want to get my A1C down" (pt-stated)       Current Barriers:  [name] Medication Adherence  . Financial Barriers . Knowledge deficits related to diet  Pharmacist Clinical Goal(s):  [name] Over the next 30 days, patient will continue to work with PharmD to address needs related to address needs related to diabetes management.  . Over the next 30 days, patient will demonstrate improved medication adherence as evidenced by patient report of adherence  Interventions:    Counsel on importance of medication adherence - Mr. Shin reports that he has been continuing to use his weekly pillbox and injecting his Trulicity on Tuesdays; denies any missed doses  Provide verbal education on blood sugar management and monitoring  Reports that his blood sugar has been good (see reported numbers below  Denies any low blood sugars  Counsel patient again about the [employer] Over the Counter (OTC) benefit  Call [employer] OTC benefit with patient on the line. Speak with representative Alex.   Mr. Luhman places order for upper arm blood pressure monitor and catalog to be mailed to patient (Confirmation # [id number])  Mr. Francisco reports having two weeks of Trulicity remaining. Counsel patient to call [facility] pharmacy today to reorder refill from patient assistance program. Provide patient with phone number again.  Patient Self Care Activities:  . Self administers all medications as prescribed . To check blood sugars daily to twice daily and keep log. Date Fasting Blood Glucose Before  Supper  7 - July 119 103  8 - July 114 122  9 - July 126 133  10 - July 118 -  11 - July - -  12 - July - -  13 - July 134 -  14 - July 125   Average 122    . To contact pharmacy to order refills of medication o To call [facility] (dispensing pharmacy for [employer] patient assistance program) for refill of Trulicity today . Calls provider office for new concerns or questions. . Patient to eat regular meals  Please see past updates related to this goal by clicking on the "Past Updates" button in the selected goal         The patient verbalized understanding of instructions provided today and declined a print copy of patient instruction materials.   The care management team will reach out to the patient again over the next 30 days.   Harlow Asa, PharmD, [facility] [employer] [phone]

## 2018-08-17 ENCOUNTER — Telehealth: Payer: Self-pay

## 2018-08-18 ENCOUNTER — Telehealth: Payer: Self-pay | Admitting: Family Medicine

## 2018-08-18 NOTE — Telephone Encounter (Signed)
I left a message on both home and mobile numbers asking the patient to call and schedule AWV with Tiffany. VDM (DD)

## 2018-08-20 ENCOUNTER — Ambulatory Visit: Payer: Medicare HMO | Admitting: *Deleted

## 2018-08-20 DIAGNOSIS — I10 Essential (primary) hypertension: Secondary | ICD-10-CM

## 2018-08-20 DIAGNOSIS — E113293 Type 2 diabetes mellitus with mild nonproliferative diabetic retinopathy without macular edema, bilateral: Secondary | ICD-10-CM

## 2018-08-20 NOTE — Patient Instructions (Signed)
Thank you allowing the Chronic Care Management Team to be a part of your care! It was a pleasure speaking with you today!   CCM (Chronic Care Management) Team   Berk Pilot RN, BSN Nurse Care Coordinator  [phone]  Catie [facility] PharmD  Clinical Pharmacist  [phone]  Eula Fried LCSW Clinical Social Worker [phone]  Goals Addressed            This Visit's Progress   . I need help with my diabetic diet (pt-stated)       Current Barriers:  [name] Knowledge Deficits related to basic Diabetes pathophysiology and self care/management  . Diabetic diet  Case Manager Clinical Goal(s):  Over the next 90 days, patient will demonstrate improved adherence to prescribed treatment plan for diabetes self care/management as evidenced by:  . adherence to ADA/ carb modified diet  Interventions:  . Provided education to patient about basic DM disease process . Reviewed medications with patient and discussed importance of medication adherence . Discussed plans with patient for ongoing care management follow up and provided patient with direct contact information for care management team . Provided patient with verbal educational materials related to hypo and hyperglycemia and importance of correct treatment  . Patient reports no sugars over 150 and the lowest am sugar 92 and highest am sugar 144.  [name] Reviewed basics of ADA/Carb modified diet- discussed easy low carb options that travel easy . Patient discussed the possibility of obtaining a continuous glucose monitor related to his early am schedule and difficulties of being able to check sugars during the day related to job. Made him aware I would discuss this with CCM Pharmacist. . Patient also talked about his recent losses of his Brother to covid-19, his sister in law to an MI and another young relative. Empathetic listening.  . 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 Let him know I would pass the message to the provider.   Patient Self Care Activities:  . UNABLE to independently adhere to carb modified diabetic diet  Please see past updates related to this goal by clicking on the "Past Updates" button in the selected goal          The patient verbalized understanding of instructions provided today and declined a print copy of patient instruction materials.   The patient has been provided with contact information for the care management team and has been advised to call with any health related questions or concerns.    COVID-19: How to Protect Yourself and Others Know how it spreads  There is currently no vaccine to prevent coronavirus disease 2019 (COVID-19).  The best way to prevent illness is to avoid being exposed to this virus.  The virus is thought to spread mainly from person-to-person. ? Between people who are in close contact with one another (within about 6 feet). ? Through respiratory droplets produced when an infected person coughs, sneezes or talks. ? These droplets can land in the mouths or noses of people who are nearby or possibly be inhaled into the lungs. ? Some recent studies have suggested that COVID-19 may be spread by people who are not showing symptoms. Everyone should Clean your hands often  Wash your hands often with soap and water for at least 20 seconds especially after you have been in a public place, or after blowing your nose, coughing, or  sneezing.  If soap and water are not readily available, use a hand sanitizer that contains at least 60% alcohol. Cover all surfaces of your hands and rub them together until they feel dry.  Avoid touching your eyes, nose, and mouth with unwashed hands. Avoid close contact  Stay home if you are sick.  Avoid close contact with people who are sick.  Put distance between yourself and other people. ? Remember that some people without symptoms may be  able to spread virus. ? This is especially important for people who are at higher risk of getting very [employer].[place].www.cdc.gov/coronavirus/2019-ncov/need-extra-precautions/people-at-higher-risk.html Cover your mouth and nose with a cloth face cover when around others  You could spread COVID-19 to others even if you do not feel sick.  Everyone should wear a cloth face cover when they have to go out in public, for example to the grocery store or to pick up other necessities. ? Cloth face coverings should not be placed on young children under age 51, anyone who has trouble breathing, or is unconscious, incapacitated or otherwise unable to remove the mask without assistance.  The cloth face cover is meant to protect other people in case you are infected.  Do NOT use a facemask meant for a Research scientist (physical sciences)healthcare worker.  Continue to keep about 6 feet between yourself and others. The cloth face cover is not a substitute for social distancing. Cover coughs and sneezes  If you are in a private setting and do not have on your cloth face covering, remember to always cover your mouth and nose with a tissue when you cough or sneeze or use the inside of your elbow.  Throw used tissues in the trash.  Immediately wash your hands with soap and water for at least 20 seconds. If soap and water are not readily available, clean your hands with a hand sanitizer that contains at least 60% alcohol. Clean and disinfect  Clean AND disinfect frequently touched surfaces daily. This includes tables, doorknobs, light switches, countertops, handles, desks, phones, keyboards, toilets, faucets, and sinks. ktimeonline.comwww.cdc.gov/coronavirus/2019-ncov/prevent-getting-sick/disinfecting-your-home.html  If surfaces are dirty, clean them: Use detergent or soap and water prior to disinfection.  Then, use a household disinfectant. You can see a list of EPA-registered household disinfectants here. [place].co.ukcdc.gov/coronavirus 05/26/2018 This information is not  intended to replace advice given to you by your health care provider. Make sure you discuss any questions you have with your health care provider. Document Released: 05/05/2018 Document Revised: 06/03/2018 Document Reviewed: 05/05/2018 Elsevier Patient Education  2020 [employer]

## 2018-08-20 NOTE — Chronic Care Management (AMB) (Signed)
  Chronic Care Management   Follow Up Note   08/20/2018 Name: Jonahtan Manseau. MRN: [id number] DOB: 02/19/1945  Referred by: [name], DO Reason for referral : Chronic Care Management (DM2 )   Cordel Drewes. is a 73 y.o. year old male who is a primary care patient of [name], DO. The CCM team was consulted for assistance with chronic disease management and care coordination needs.    Review of patient status, including review of consultants reports, relevant laboratory and other test results, and collaboration with appropriate care team members and the patient's provider was performed as part of comprehensive patient evaluation and provision of chronic care management services.    Goals Addressed            This Visit's Progress   . I need help with my diabetic diet (pt-stated)       Current Barriers:  [name] Knowledge Deficits related to basic Diabetes pathophysiology and self care/management  . Diabetic diet  Case Manager Clinical Goal(s):  Over the next 90 days, patient will demonstrate improved adherence to prescribed treatment plan for diabetes self care/management as evidenced by:  . adherence to ADA/ carb modified diet  Interventions:  . Provided education to patient about basic DM disease process . Reviewed medications with patient and discussed importance of medication adherence . Discussed plans with patient for ongoing care management follow up and provided patient with direct contact information for care management team . Provided patient with verbal educational materials related to hypo and hyperglycemia and importance of correct treatment  . Patient reports no sugars over 150 and the lowest am sugar 92 and highest am sugar 144.  [name] Reviewed basics of ADA/Carb modified diet- discussed easy low carb options that travel easy . Patient discussed the possibility of obtaining a continuous glucose monitor related to his early am schedule and  difficulties of being able to check sugars during the day related to job. Made him aware I would discuss this with CCM Pharmacist. . Patient also talked about his recent losses of his Brother to covid-19, his sister in law to an MI and another young relative. Empathetic listening.  . 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 Let him know I would pass the message to the provider.   Patient Self Care Activities:  . UNABLE to independently adhere to carb modified diabetic diet  Please see past updates related to this goal by clicking on the "Past Updates" button in the selected goal           The care management team will reach out to the patient again over the next 30 days.  The patient has been provided with contact information for the care management team and has been advised to call with any health related questions or concerns.    Merlene Morse Elmire Amrein RN, BSN Nurse Case Pharmacist, [facility]  [phone]) Business Mobile

## 2018-08-25 ENCOUNTER — Telehealth: Payer: Self-pay | Admitting: Family Medicine

## 2018-08-25 NOTE — Telephone Encounter (Signed)
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 His next question is about insurance coverage I advised him to find out with insurance, if he calls back does he need virtual appointment or can go to site for testing or don't need to get testing please suggest ?

## 2018-08-25 NOTE — Telephone Encounter (Signed)
Left message for patient to call back  

## 2018-08-25 NOTE — Telephone Encounter (Signed)
See other phone note today  Nobie Putnam, [facility] 08/25/2018, 3:27 PM

## 2018-08-25 NOTE — Telephone Encounter (Signed)
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  I called and left a message for him on his voicemail.  I recommend that patients are tested if he has any symptoms, or if he has had any known exposure or suspected exposure.  He would likely have to be taken out of work if he was being tested.  If he has no concerns at all, then I would not recommend testing.  If he does have significant questions - and wants to talk more, or would like a test - then he can schedule a Virtual Telephoen visit to discuss in more detail.  Nobie Putnam, [facility] 08/25/2018, 9:02 AM

## 2018-08-25 NOTE — Telephone Encounter (Signed)
Appointment is not required if all he wants to do is go get tested and if he has no symptoms.  If he has a lot of questions about COVID, or if he has any symptoms, then I would highly recommend a virtual appointment.  Nobie Putnam, [facility] 08/25/2018, 3:27 PM

## 2018-08-25 NOTE — Telephone Encounter (Signed)
Pt returned your call. He said there was something you wanted to discuss with him.  You were in a room  Pt's call back is   [phone]  Thanks Con Memos

## 2018-08-26 NOTE — Telephone Encounter (Signed)
Patient advised.

## 2018-08-27 ENCOUNTER — Telehealth: Payer: Self-pay | Admitting: Family Medicine

## 2018-08-27 NOTE — Telephone Encounter (Signed)
Left message he still has refill advised to call pharmacy.

## 2018-08-27 NOTE — Telephone Encounter (Signed)
Pt needs a refill on  Lisinopril 5 mg  [facility]  [address]

## 2018-08-28 ENCOUNTER — Ambulatory Visit (INDEPENDENT_AMBULATORY_CARE_PROVIDER_SITE_OTHER): Payer: Medicare HMO | Admitting: Pharmacist

## 2018-08-28 DIAGNOSIS — E113293 Type 2 diabetes mellitus with mild nonproliferative diabetic retinopathy without macular edema, bilateral: Secondary | ICD-10-CM

## 2018-08-28 DIAGNOSIS — E1169 Type 2 diabetes mellitus with other specified complication: Secondary | ICD-10-CM

## 2018-08-28 DIAGNOSIS — E785 Hyperlipidemia, unspecified: Secondary | ICD-10-CM | POA: Diagnosis not present

## 2018-08-28 DIAGNOSIS — I10 Essential (primary) hypertension: Secondary | ICD-10-CM | POA: Diagnosis not present

## 2018-08-28 NOTE — Chronic Care Management (AMB) (Signed)
Chronic Care Management   Follow Up Note   08/28/2018 Name: Christopher BruinsRobert Uliano Jr. MRN: [id number] DOB: 1945-06-30  Referred by: Christopher CordsKaramalegos, Alexander J, DO Reason for referral : Chronic Care Management (Patient Phone Call)   Christopher BruinsRobert Biskup Jr. is a 73 y.o. year old male who is a primary care patient of Christopher CordsKaramalegos, Alexander J, DO. The CCM team was consulted for assistance with chronic disease management and care coordination needs.  [name] has a past medical history including but not limited to: hypertension, type 2 diabetes and hyperlipidemia  I reached out to [name] by phone today for follow up regarding diabetes medication management.  Review of patient status, including review of consultants reports, relevant laboratory and other test results, and collaboration with appropriate care team members and the patient's provider was performed as part of comprehensive patient evaluation and provision of chronic care management services.    Goals Addressed            This Visit's Progress   . "I want to get my A1C down" (pt-stated)       Current Barriers:  [name]. Medication Adherence  . Financial Barriers . Knowledge deficits related to diet  Pharmacist Clinical Goal(s):  [name]. Over the next 30 days, patient will continue to work with PharmD to address needs related to address needs related to diabetes management.  . Over the next 30 days, patient will demonstrate improved medication adherence as evidenced by patient report of adherence  Interventions:    Collaborate with CM Nurse Case Manager   Address patient's questions regarding continuous blood glucose monitoring system eligibility through Medicare and cost without insurance  Follow up with [name] regarding his questions about COVID-19 testing.   [name] denies any current COVID-19 symptoms or any known contact with anyone who is COVID-positive. Denies further questions.  Counsel on importance of medication adherence - Mr.  [name] reports that he has been continuing to use his weekly pillbox and injecting his Trulicity on Tuesdays; denies any missed doses  Provide verbal education on blood sugar management and monitoring  Review recent blood sugar results (see below)  Patient reads results from glucometer  Encourage patient to restart writing results on log when possible, as log allows patient to add additional notes  Denies any low blood sugars  Counsel patient on blood glucose testing technique.   [name] confirms having received his blood pressure monitor in the mail. States that his readings have been normal when he has checked his blood pressure  Encourage patient to keep log of blood pressures when he checks    Patient Self Care Activities:  . Self administers all medications as prescribed . To check blood sugars daily to twice daily and keep log. Date Fasting Blood Glucose Before Supper  31- July 112 -  1 - August - -  2 - August 106 -  3 - August 205 104  4 - August 119 104  5 - August 128 -  6 - August 137 120  7 - August 148   *Patient unsure why his CBG was elevated on 8/3 - denies keeping notes on log, but denies missed doses of medications . To contact pharmacy to order refills of medication o To call [employer] to request next fill of diabetes testing supplies . Calls provider office for new concerns or questions. . Patient to eat regular meals  Please see past updates related to this goal by clicking on the "Past Updates" button in the  selected goal         Plan  The care management team will reach out to the patient again over the next 30 days.   Harlow Asa, PharmD, [place] [employer] [phone]

## 2018-08-28 NOTE — Patient Instructions (Signed)
Thank you allowing the Chronic Care Management Team to be a part of your care! It was a pleasure speaking with you today!     CCM (Chronic Care Management) Team    Janci Minor RN, BSN Nurse Care Coordinator  [phone]   Harlow Asa PharmD  Clinical Pharmacist  [phone]   Eula Fried LCSW Clinical Social Worker [phone]  Visit Information  Goals Addressed            This Visit's Progress   . "I want to get my A1C down" (pt-stated)       Current Barriers:  [name] Medication Adherence  . Financial Barriers . Knowledge deficits related to diet  Pharmacist Clinical Goal(s):  [name] Over the next 30 days, patient will continue to work with PharmD to address needs related to address needs related to diabetes management.  . Over the next 30 days, patient will demonstrate improved medication adherence as evidenced by patient report of adherence  Interventions:    Collaborate with CM Nurse Case Manager   Address patient's questions regarding continuous blood glucose monitoring system eligibility through Medicare and cost without insurance  Follow up with Mr. Hemann regarding his questions about COVID-19 testing.   Mr. Gorter denies any COVID-19 symptoms or any known contact with anyone who is COVID-positive. Denies further questions.  Counsel on importance of medication adherence - Mr. Dec reports that he has been continuing to use his weekly pillbox and injecting his Trulicity on Tuesdays; denies any missed doses  Provide verbal education on blood sugar management and monitoring  Review recent blood sugar results (see below)  Patient reads results from glucometer  Encourage patient to restart writing results on log when possible, as log allows patient to add additional notes  Denies any low blood sugars  Counsel patient on blood glucose testing technique.   Mr. Udell confirms having received his blood pressure monitor in the mail. States that his readings  have been normal when he has checked his blood pressure  Encourage patient to keep log of blood pressures when he checks    Patient Self Care Activities:  . Self administers all medications as prescribed . To check blood sugars daily to twice daily and keep log. Date Fasting Blood Glucose Before Supper  31- July 112 -  1 - August - -  2 - August 106 -  3 - August 205 104  4 - August 119 104  5 - August 128 -  6 - August 137 120  7 - August 148   *Patient unsure why his CBG was elevated on 8/3 - denies keeping notes on log, but denies missed doses of medications . To contact pharmacy to order refills of medication o To call [employer] to request next fill of diabetes testing supplies . Calls provider office for new concerns or questions. . Patient to eat regular meals  Please see past updates related to this goal by clicking on the "Past Updates" button in the selected goal         The patient verbalized understanding of instructions provided today and declined a print copy of patient instruction materials.   The care management team will reach out to the patient again over the next 30 days.   Harlow Asa, PharmD, [place] [employer] [phone]

## 2018-08-31 ENCOUNTER — Telehealth: Payer: Self-pay

## 2018-09-01 ENCOUNTER — Telehealth: Payer: Self-pay

## 2018-09-02 ENCOUNTER — Encounter: Payer: Self-pay | Admitting: Family Medicine

## 2018-09-02 ENCOUNTER — Other Ambulatory Visit: Payer: Self-pay | Admitting: Family Medicine

## 2018-09-02 ENCOUNTER — Ambulatory Visit (INDEPENDENT_AMBULATORY_CARE_PROVIDER_SITE_OTHER): Payer: Medicare HMO | Admitting: Family Medicine

## 2018-09-02 ENCOUNTER — Other Ambulatory Visit: Payer: Self-pay

## 2018-09-02 VITALS — BP 114/60 | HR 67 | Temp 98.6°F | Resp 16 | Ht 71.0 in | Wt 191.0 lb

## 2018-09-02 DIAGNOSIS — R351 Nocturia: Secondary | ICD-10-CM

## 2018-09-02 DIAGNOSIS — Z0000 Encounter for general adult medical examination without abnormal findings: Secondary | ICD-10-CM

## 2018-09-02 DIAGNOSIS — E113293 Type 2 diabetes mellitus with mild nonproliferative diabetic retinopathy without macular edema, bilateral: Secondary | ICD-10-CM

## 2018-09-02 DIAGNOSIS — I10 Essential (primary) hypertension: Secondary | ICD-10-CM

## 2018-09-02 DIAGNOSIS — E785 Hyperlipidemia, unspecified: Secondary | ICD-10-CM

## 2018-09-02 DIAGNOSIS — E1169 Type 2 diabetes mellitus with other specified complication: Secondary | ICD-10-CM

## 2018-09-02 LAB — POCT GLYCOSYLATED HEMOGLOBIN (HGB A1C): Hemoglobin A1C: 6.3 % — AB (ref 4.0–5.6)

## 2018-09-02 NOTE — Assessment & Plan Note (Signed)
Dramatic improvement A1c down to 6.3, previously >8, now on improved lifestyle and trulicity 1.5 and low dose sulfonylurea Followed by [place] for financial assistance w/ Trulicity / testing supplies Complications - DM retinopathy and Hyperlipidemia - increases risk of vascular complication  Plan: - Continue Trulicity 1.5mg  weekly [place] injection, Metformin 1000mg  BID, Glipizide 2.5mg  (half of 5mg ) daily w/ breakfast - Counseling on risk of hypoglycemia, he has not had any lately, doing well on this combination, did not do as well off his sulfonylurea - Encourage improved lifestyle - low carb, low sugar diet, reduce portion size, start regular exercise - Check CBG, bring log to next visit for review - pending new testing supplies ordered - Continue ASA, ACEi, resumed Statin Needs update DM Eye Dr [name] apt - advised him to schedule DM Foot exam today

## 2018-09-02 NOTE — Patient Instructions (Addendum)
Thank you for coming to the office today.  Recent Labs    10/14/17 1457 03/16/18 1137 09/02/18 1342  HGBA1C 6.5* 8.7* 6.3*    Great job !  Keep up good work on medicine and diet / lifestyle to keep sugar under control.  BP is normal today as well.  Call Dr [name] to schedule eye exam  [facility]   Address: [address], [place] [address] Phone: [phone]  Website: [email or web address]    DUE for FASTING BLOOD WORK (no food or drink after midnight before the lab appointment, only water or coffee without cream/sugar on the morning of)  SCHEDULE "Lab Only" visit in the morning at the clinic for lab draw in  3 MONTHS   - Make sure Lab Only appointment is at about 1 week before your next appointment, so that results will be available  For Lab Results, once available within 2-3 days of blood draw, you can can log in to MyChart online to view your results and a brief explanation. Also, we can discuss results at next follow-up visit.   Please schedule a Follow-up Appointment to: Return in about 3 months (around 12/03/2018) for Annual Physical.  If you have any other questions or concerns, please feel free to call the office or send a message through [place]. You may also schedule an earlier appointment if necessary.  Additionally, you may be receiving a survey about your experience at our office within a few days to 1 week by e-mail or mail. We value your feedback.  Nobie Putnam, DO [place]

## 2018-09-02 NOTE — Assessment & Plan Note (Signed)
See A&P Needs to schedule w/ Dr Woodard for DM Eye Exam 

## 2018-09-02 NOTE — Progress Notes (Signed)
Subjective:    Patient ID: Christopher Printup Jr., male    DOB: July 19, 1945, 73 y.o.   MRN: [id number]  Christopher BruinsRobert MPrincess Bruinsoore Jr. is a 73 y.o. male presenting on 09/02/2018 for Diabetes   HPI  CHRONIC DM, Type 2w/ Retinopathy - Last visit with me 05/2018, for same problem diabetes, he was uncontrolled A1c >8 previously and off trulicity then able to restart and proceeded to get financial assistance, he also had been on sulfonylurea, followed in interval by [facility] as well. See prior notes for background info - Due for A1c today - Today patient reports he is doing very well overall. No new major concerns. No further significant hypoglycemia readings. He does not have report of log but available in last telephone note 08/28/18 per [facility]. He has high in past week of 205, low of 104. avg 120 Meds: - Trulicity 1.5mg  weekly injection - Metformin 1000mg  BID - Taking Glipizide 2.5mg  (half of 5mg ) short acting sulfonylurea daily w/ breakfast Reports good compliance. Tolerating well w/o side-effects Currently on ACEi Lifestyle: - Weight down 15 lbs in 6 months - Diet (Trying to improve diet still) - Exercise (Limited exercise due to time- gradually improving) - Dr Clydene PughWoodard last DM Eye exam5/2019 w/ DM retinopathy, requiring monitoring - has not rescheduled or returned, he is due to go back now. Denies hypoglycemia, polyuria, visual changes, numbness or tingling.  CHRONIC HTN: Reportsno new concerns. Controlled without problem. Current Meds -Lisinopril-HCTZ 20-12.5mg  once daily Reports good compliance, took meds today. Tolerating well, w/o complaints. Denies CP, dyspnea, HA, edema, dizziness / lightheadedness    [facility] 09/02/2018 05/01/2018 10/14/2017  Decreased Interest 0 0 0  Down, Depressed, Hopeless 0 0 0  PHQ - 2 Score 0 0 0  Altered sleeping - - -  Tired, decreased energy - - -  Change in appetite - - -  Feeling bad or failure about yourself  - - -  Trouble  concentrating - - -  Moving slowly or fidgety/restless - - -  Suicidal thoughts - - -  PHQ-9 Score - - -  Difficult doing work/chores - - -    Social History   Tobacco Use  . Smoking status: Current Some Day Smoker    Types: Cigarettes  . Smokeless tobacco: Current User  . Tobacco comment: pack of cigarettes last 1-2 months - doesnt inhale  Substance Use Topics  . Alcohol use: Yes    Alcohol/week: 1.0 standard drinks    Types: 1 Cans of beer per week    Comment: 1-2 beers week   . Drug use: No    Review of Systems Per HPI unless specifically indicated above     Objective:    BP 114/60   Pulse 67   Temp 98.6 F (37 C) (Oral)   Resp 16   Ht 5\' 11"  (1.803 m)   Wt 191 lb (86.6 kg)   BMI 26.64 kg/m   Wt Readings from Last 3 Encounters:  09/02/18 191 lb (86.6 kg)  03/13/18 206 lb (93.4 kg)  10/14/17 205 lb (93 kg)    Physical Exam Vitals signs and nursing note reviewed.  Constitutional:      General: He is not in acute distress.    Appearance: He is well-developed. He is not diaphoretic.     Comments: Well-appearing, comfortable, cooperative  HENT:     Head: Normocephalic and atraumatic.  Eyes:     General:        Right  eye: No discharge.        Left eye: No discharge.     Conjunctiva/sclera: Conjunctivae normal.  Neck:     Musculoskeletal: Normal range of motion and neck supple.     Thyroid: No thyromegaly.  Cardiovascular:     Rate and Rhythm: Normal rate and regular rhythm.     Heart sounds: Normal heart sounds. No murmur.  Pulmonary:     Effort: Pulmonary effort is normal. No respiratory distress.     Breath sounds: Normal breath sounds. No wheezing or rales.  Musculoskeletal: Normal range of motion.  Lymphadenopathy:     Cervical: No cervical adenopathy.  Skin:    General: Skin is warm and dry.     Findings: No erythema or rash.  Neurological:     Mental Status: He is alert and oriented to person, place, and time.  Psychiatric:        Behavior:  Behavior normal.     Comments: Well groomed, good eye contact, normal speech and thoughts      Diabetic Foot Exam - Simple   Simple Foot Form Diabetic Foot exam was performed with the following findings: Yes 09/02/2018  1:48 PM  Visual Inspection No deformities, no ulcerations, no other skin breakdown bilaterally: Yes Sensation Testing Intact to touch and monofilament testing bilaterally: Yes Pulse Check Posterior Tibialis and Dorsalis pulse intact bilaterally: Yes Comments    Recent Labs    10/14/17 1457 03/16/18 1137 09/02/18 1342  HGBA1C 6.5* 8.7* 6.3*    Results for orders placed or performed in visit on 09/02/18  POCT HgB A1C  Result Value Ref Range   Hemoglobin A1C 6.3 (A) 4.0 - 5.6 %      Assessment & Plan:   Problem List Items Addressed This Visit    Controlled type 2 diabetes mellitus with retinopathy ([place]) - Primary    Dramatic improvement A1c down to 6.3, previously >8, now on improved lifestyle and trulicity 1.5 and low dose sulfonylurea Followed by [facility] for financial assistance w/ Trulicity / testing supplies Complications - DM retinopathy and Hyperlipidemia - increases risk of vascular complication  Plan: - Continue Trulicity 1.5mg  weekly [place] injection, Metformin 1000mg  BID, Glipizide 2.5mg  (half of 5mg ) daily w/ breakfast - Counseling on risk of hypoglycemia, he has not had any lately, doing well on this combination, did not do as well off his sulfonylurea - Encourage improved lifestyle - low carb, low sugar diet, reduce portion size, start regular exercise - Check CBG, bring log to next visit for review - pending new testing supplies ordered - Continue ASA, ACEi, resumed Statin Needs update DM Eye Dr [name] apt - advised him to schedule DM Foot exam today      Relevant Orders   POCT HgB A1C (Completed)   Diabetic retinopathy ([place])    See A&P Needs to schedule w/ Dr [name] for DM Eye Exam         No orders of the defined types were  placed in this encounter.   Follow up plan: Return in about 3 months (around 12/03/2018) for Annual Physical.  Future labs ordered for 12/01/2018  Nobie Putnam, [facility] 09/02/2018, 1:40 PM

## 2018-09-03 ENCOUNTER — Telehealth: Payer: Self-pay

## 2018-09-18 ENCOUNTER — Telehealth: Payer: Self-pay | Admitting: Family Medicine

## 2018-09-18 NOTE — Telephone Encounter (Signed)
I left a message asking the patient to call and schedule AWV with Tiffany. Last AWV 08/05/17 so patient can be scheduled at next available opening. VDM (DD)

## 2018-09-24 ENCOUNTER — Ambulatory Visit (INDEPENDENT_AMBULATORY_CARE_PROVIDER_SITE_OTHER): Payer: Medicare HMO | Admitting: Pharmacist

## 2018-09-24 DIAGNOSIS — E113293 Type 2 diabetes mellitus with mild nonproliferative diabetic retinopathy without macular edema, bilateral: Secondary | ICD-10-CM | POA: Diagnosis not present

## 2018-09-24 DIAGNOSIS — I10 Essential (primary) hypertension: Secondary | ICD-10-CM | POA: Diagnosis not present

## 2018-09-24 NOTE — Chronic Care Management (AMB) (Signed)
Chronic Care Management   Follow Up Note   09/24/2018 Name: Tavius Turgeon. MRN: [id number] DOB: August 27, 1945  Referred by: [name], DO Reason for referral : Chronic Care Management (Patient Phone Call)   Detrich Rakestraw. is a 73 y.o. year old male who is a primary care patient of [name], DO. The CCM team was consulted for assistance with chronic disease management and care coordination needs.  Mr. Herendeen has a past medical history including but not limited to: hypertension, type 2 diabetes and hyperlipidemia  I reached out to [name]. by phone today.   Review of patient status, including review of consultants reports, relevant laboratory and other test results, and collaboration with appropriate care team members and the patient's provider was performed as part of comprehensive patient evaluation and provision of chronic care management services.     Outpatient Encounter Medications as of 09/24/2018  Medication Sig Note  . Dulaglutide (TRULICITY) 1.5 ID/7.8EU SOPN Inject 1.5 mg into the skin once a week. 07/01/2018: On Tuesdays  . glipiZIDE (GLUCOTROL) 5 MG tablet Take 0.5 tablets (2.5 mg total) by mouth daily before breakfast.   . lisinopril-hydrochlorothiazide (ZESTORETIC) 20-12.5 MG tablet Take 1 tablet by mouth daily.   . metFORMIN (GLUCOPHAGE) 1000 MG tablet TAKE 1 TABLET(1000 MG) BY MOUTH TWICE DAILY WITH A MEAL   . pravastatin (PRAVACHOL) 10 MG tablet TAKE 1 TABLET(10 MG) BY MOUTH DAILY   . ACCU-CHEK AVIVA PLUS test strip Check sugar up to 2 x daily   . Accu-Chek FastClix Lancets MISC Check sugar up to 2 x daily   . Accu-Chek Softclix Lancets lancets Check sugar up to 2 x daily   . Blood Glucose Monitoring Suppl (ACCU-CHEK AVIVA PLUS) w/Device KIT Use to check blood sugar up to x 2 daily   . Selenium Sulfide 2.25 % SHAM APPLY EXTERNALLY TO THE AFFECTED AREA DAILY AS NEEDED FOR IRRITATION   . tadalafil (ADCIRCA/CIALIS) 20 MG tablet Take 1  tablet (20 mg total) by mouth daily as needed for erectile dysfunction.    No facility-administered encounter medications on file as of 09/24/2018.     Goals Addressed            This Visit's Progress   . "I want to get my A1C down" (pt-stated)       Current Barriers:  [name] Medication Adherence  . Financial Barriers . Knowledge deficits related to nutrition with diabetes  Pharmacist Clinical Goal(s):  [name] Over the next 30 days, patient will continue to work with PharmD to address needs related to address needs related to diabetes management.  . Over the next 30 days, patient will demonstrate maintained medication adherence as evidenced by patient report of adherence  Interventions:    Counsel on importance of medication adherence - Mr. Salsgiver reports that he has been continuing to use his weekly pillbox and injecting his Trulicity on Tuesdays; denies any missed doses  Provide verbal education on blood sugar management and monitoring  Review recent blood sugar results (see below)        Denies any low blood sugars      Encourage patient to keep log of blood pressures when he checks and bring to medical appointments  Inquire about supply of testing supplies - confirms receiving from [facility] mail order  Patient Self Care Activities:  . Self administers all medications as prescribed . To check blood sugars daily to twice daily and keep log. Date Fasting Blood Glucose Before Supper  25 - August 88 94  28 - August 105 -  29 - August - 96  30 - August 107 -  31 - August 94 156  1 - September 113 147  2 - September - -  3 - September 132   Average 106 123   . To contact pharmacy to order refills of medication . Calls provider office for new concerns or questions. . Patient to eat regular meals  Please see past updates related to this goal by clicking on the "Past Updates" button in the selected goal         Plan  The care management team will reach out to the patient again  over the next 30 days.   Harlow Asa, PharmD, [place] [employer] [phone]

## 2018-09-24 NOTE — Patient Instructions (Signed)
Thank you allowing the Chronic Care Management Team to be a part of your care! It was a pleasure speaking with you today!     CCM (Chronic Care Management) Team    Janci Minor RN, BSN Nurse Care Coordinator  [phone]   Harlow Asa PharmD  Clinical Pharmacist  [phone]   Eula Fried LCSW Clinical Social Worker [phone]  Visit Information  Goals Addressed            This Visit's Progress   . "I want to get my A1C down" (pt-stated)       Current Barriers:  [name] Medication Adherence  . Financial Barriers . Knowledge deficits related to nutrition with diabetes  Pharmacist Clinical Goal(s):  [name] Over the next 30 days, patient will continue to work with PharmD to address needs related to address needs related to diabetes management.  . Over the next 30 days, patient will demonstrate maintained medication adherence as evidenced by patient report of adherence  Interventions:    Counsel on importance of medication adherence - Mr. Ager reports that he has been continuing to use his weekly pillbox and injecting his Trulicity on Tuesdays; denies any missed doses  Provide verbal education on blood sugar management and monitoring  Review recent blood sugar results (see below)        Denies any low blood sugars      Encourage patient to keep log of blood pressures when he checks and bring to medical appointments  Inquire about supply of testing supplies - confirms receiving from [facility] mail order  Patient Self Care Activities:  . Self administers all medications as prescribed . To check blood sugars daily to twice daily and keep log. Date Fasting Blood Glucose Before Supper  27 - August 88 94  28 - August 105 -  29 - August - 96  30 - August 107 -  31 - August 94 156  1 - September 113 147  2 - September - -  3 - September 132   Average 106 123   . To contact pharmacy to order refills of medication . Calls provider office for new concerns or  questions. . Patient to eat regular meals  Please see past updates related to this goal by clicking on the "Past Updates" button in the selected goal         The patient verbalized understanding of instructions provided today and declined a print copy of patient instruction materials.   The care management team will reach out to the patient again over the next 30 days.   Harlow Asa, PharmD, [place] [employer] [phone]

## 2018-10-02 ENCOUNTER — Telehealth: Payer: Self-pay | Admitting: Family Medicine

## 2018-10-02 NOTE — Telephone Encounter (Signed)
I called the patient to schedule AWV with Tiffany, but there was no answer and no option to leave a message. VDM (DD) 

## 2018-10-05 ENCOUNTER — Telehealth: Payer: Self-pay

## 2018-10-15 ENCOUNTER — Telehealth: Payer: Self-pay

## 2018-10-20 ENCOUNTER — Ambulatory Visit (INDEPENDENT_AMBULATORY_CARE_PROVIDER_SITE_OTHER): Payer: Medicare HMO | Admitting: Family Medicine

## 2018-10-20 ENCOUNTER — Other Ambulatory Visit: Payer: Self-pay

## 2018-10-20 ENCOUNTER — Encounter: Payer: Self-pay | Admitting: Family Medicine

## 2018-10-20 DIAGNOSIS — R05 Cough: Secondary | ICD-10-CM

## 2018-10-20 DIAGNOSIS — Z20822 Contact with and (suspected) exposure to covid-19: Secondary | ICD-10-CM

## 2018-10-20 DIAGNOSIS — R110 Nausea: Secondary | ICD-10-CM

## 2018-10-20 DIAGNOSIS — R6889 Other general symptoms and signs: Secondary | ICD-10-CM | POA: Diagnosis not present

## 2018-10-20 DIAGNOSIS — U071 COVID-19: Secondary | ICD-10-CM | POA: Diagnosis not present

## 2018-10-20 DIAGNOSIS — R059 Cough, unspecified: Secondary | ICD-10-CM

## 2018-10-20 MED ORDER — ONDANSETRON HCL 4 MG PO TABS: 4.0000 mg | ORAL_TABLET | Freq: Three times a day (TID) | ORAL | 0 refills | Status: DC | PRN

## 2018-10-20 MED ORDER — BENZONATATE 100 MG PO CAPS: 100.0000 mg | ORAL_CAPSULE | Freq: Three times a day (TID) | ORAL | 0 refills | Status: DC | PRN

## 2018-10-20 NOTE — Patient Instructions (Addendum)
Thank you for coming to the office today.  You may have coronavirus / [place] Testing Information  All you need to do is arrive at a testing site. No appointment needed.  Hours (Open 8 a.m. - 3:45 p.m.) LAST TEST completed at 3:30pm  [facility]:  [facility] Entrance - [place], [place]: [place], [place], [place], [place] (entrance off [address])  [place]: [place] Main [address], [place], [place] (across from [facility] Emergency Department)  Test result may take 2-7 days to result. You will be notified by MyChart or by Phone.  Phone: [phone] [facility] contact, can inquire about status of test result)  If negative test - they will call you with result. If abnormal or positive test you will be notified as well and our office will contact you to help further with treatment plan.  May take Tylenol as needed for aches pains and fever. Prefer to avoid Ibuprofen if can help it, to avoid complication from virus.  REQUIRED self quarantine to [place] - advised to avoid all exposure with others while during treatment. Should continue to quarantine for up to 7-14 days, pending resolution of symptoms, if symptoms resolve by 7 days and is afebrile >3 days - may STOP self quarantine at that time.  If symptoms do not resolve or significantly improve OR if WORSENING - fever / cough - or worsening shortness of breath - then should contact us and seek advice on next steps in treatment at home vs where/when to seek care at Urgent Care or Hospital ED for further intervention   Please schedule a Follow-up Appointment to: Return in about 1 week (around 10/27/2018), or if symptoms worsen or fail to improve, for cough nausea.  If you have any other questions or concerns, please feel free to call the office or send a message through [place]. You may also schedule an earlier appointment  if necessary.  Additionally, you may be receiving a survey about your experience at our office within a few days to 1 week by e-mail or mail. We value your feedback.  Nobie Putnam, DO [place]

## 2018-10-20 NOTE — Progress Notes (Signed)
Virtual Visit via Telephone The purpose of this virtual visit is to provide medical care while limiting exposure to the novel coronavirus (COVID19) for both patient and office staff.  Consent was obtained for phone visit:  Yes.   Answered questions that patient had about telehealth interaction:  Yes.   I discussed the limitations, risks, security and privacy concerns of performing an evaluation and management service by telephone. I also discussed with the patient that there may be a patient responsible charge related to this service. The patient expressed understanding and agreed to proceed.  Patient Location: Home Provider Location: [facility])   ---------------------------------------------------------------------- Chief Complaint  Patient presents with  . Cough  . Nausea    3-4 days    S: Reviewed CMA documentation. I have called patient and gathered additional HPI as follows:  NAUSEA / COUGH Reports that symptoms started 3-4 days ago with nausea, without vomiting. Additionally mild cough. 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 Denies any known or suspected exposure to person with or possibly with COVID19.  Admits nausea without vomiting Denies any fevers, chills, sweats, body ache, shortness of breath, sinus pain or pressure, headache, abdominal pain, diarrhea  -------------------------------------------------------------------------- O: No physical exam performed due to remote telephone encounter.  -------------------------------------------------------------------------- A&P:   Suspected Possible COVID19 vs benign viral etiology - Reassuring without high risk symptoms - Afebrile, without dyspnea - No comorbid pulmonary conditions (asthma, COPD) or immunocompromise  Recommend COVID19 testing to  be performed ASAP, location recommended [facility] test site [facility]), advised patient info on test site and procedures for testing, first come first serve, no apt needed.  See quarantine recommendations below Out of work until test result, if negative will need work note to return Rx Zofran PRN, Tessalon PRN OTC medications recommended for symptoms. Recommend Tylenol PRN for fever or other viral symptoms Note for work - written, on Clinical cytogeneticist, with return to work strategy if negative test and resolving symptoms fever free >3 days   Meds ordered this encounter  Medications  . ondansetron (ZOFRAN) 4 MG tablet    Sig: Take 1 tablet (4 mg total) by mouth every 8 (eight) hours as needed for nausea or vomiting.    Dispense:  20 tablet    Refill:  0  . benzonatate (TESSALON) 100 MG capsule    Sig: Take 1 capsule (100 mg total) by mouth 3 (three) times daily as needed for cough.    Dispense:  30 capsule    Refill:  0    REQUIRED self quarantine to AVOID POTENTIAL SPREAD - advised to avoid all exposure with others while during TESTING (Pending result) and treatment. Should continue to quarantine for up to 7-14 days - pending resolution of symptoms, if TEST IS NEGATIVE and symptoms resolve by 7 days and is afebrile >3 days - may STOP self quarantine at that time. IF test is POSITIVE then will require 10 additional day quarantine after date of positive test result.   If symptoms do not resolve or significantly improve OR if WORSENING - fever / cough - or worsening shortness of breath - then should contact us and seek advice on next steps in treatment at home vs where/when to seek care at Urgent Care or Hospital ED for further intervention and possible testing if indicated.  Patient verbalizes understanding with the above medical recommendations including the limitation of remote medical advice.  Specific follow-up / call-back criteria were given for patient to follow-up or seek medical care  more urgently if needed.   - Time spent in direct consultation with patient on phone: 12 minutes  Nobie Putnam, [facility] 10/20/2018, 8:55 AM

## 2018-10-22 ENCOUNTER — Emergency Department
Admission: EM | Admit: 2018-10-22 | Discharge: 2018-10-22 | Disposition: A | Discharge status: Other Acute Inpatient Hospital | Payer: Medicare HMO | Attending: Emergency Medicine | Admitting: Emergency Medicine

## 2018-10-22 ENCOUNTER — Encounter (HOSPITAL_COMMUNITY): Payer: Self-pay

## 2018-10-22 ENCOUNTER — Inpatient Hospital Stay (HOSPITAL_COMMUNITY)
Admission: AD | Admit: 2018-10-22 | Discharge: 2018-10-28 | DRG: 177 | Disposition: A | Discharge status: Home Health | Payer: Medicare HMO | Source: Other Acute Inpatient Hospital | Attending: Internal Medicine | Admitting: Internal Medicine

## 2018-10-22 ENCOUNTER — Telehealth: Payer: Self-pay

## 2018-10-22 ENCOUNTER — Encounter: Payer: Self-pay | Admitting: Emergency Medicine

## 2018-10-22 ENCOUNTER — Ambulatory Visit: Payer: Self-pay | Admitting: Pharmacist

## 2018-10-22 ENCOUNTER — Other Ambulatory Visit: Payer: Self-pay

## 2018-10-22 ENCOUNTER — Emergency Department: Payer: Medicare HMO

## 2018-10-22 DIAGNOSIS — I10 Essential (primary) hypertension: Secondary | ICD-10-CM | POA: Diagnosis present

## 2018-10-22 DIAGNOSIS — F17210 Nicotine dependence, cigarettes, uncomplicated: Secondary | ICD-10-CM | POA: Diagnosis not present

## 2018-10-22 DIAGNOSIS — J9621 Acute and chronic respiratory failure with hypoxia: Secondary | ICD-10-CM | POA: Diagnosis not present

## 2018-10-22 DIAGNOSIS — E113293 Type 2 diabetes mellitus with mild nonproliferative diabetic retinopathy without macular edema, bilateral: Secondary | ICD-10-CM

## 2018-10-22 DIAGNOSIS — Z887 Allergy status to serum and vaccine status: Secondary | ICD-10-CM | POA: Diagnosis not present

## 2018-10-22 DIAGNOSIS — J9601 Acute respiratory failure with hypoxia: Secondary | ICD-10-CM | POA: Diagnosis present

## 2018-10-22 DIAGNOSIS — E871 Hypo-osmolality and hyponatremia: Secondary | ICD-10-CM | POA: Diagnosis present

## 2018-10-22 DIAGNOSIS — E785 Hyperlipidemia, unspecified: Secondary | ICD-10-CM | POA: Diagnosis present

## 2018-10-22 DIAGNOSIS — J1282 Pneumonia due to coronavirus disease 2019: Secondary | ICD-10-CM

## 2018-10-22 DIAGNOSIS — R0902 Hypoxemia: Secondary | ICD-10-CM | POA: Diagnosis not present

## 2018-10-22 DIAGNOSIS — J9600 Acute respiratory failure, unspecified whether with hypoxia or hypercapnia: Secondary | ICD-10-CM | POA: Diagnosis not present

## 2018-10-22 DIAGNOSIS — Z79899 Other long term (current) drug therapy: Secondary | ICD-10-CM

## 2018-10-22 DIAGNOSIS — E1169 Type 2 diabetes mellitus with other specified complication: Secondary | ICD-10-CM | POA: Diagnosis not present

## 2018-10-22 DIAGNOSIS — E11319 Type 2 diabetes mellitus with unspecified diabetic retinopathy without macular edema: Secondary | ICD-10-CM | POA: Diagnosis not present

## 2018-10-22 DIAGNOSIS — T380X5A Adverse effect of glucocorticoids and synthetic analogues, initial encounter: Secondary | ICD-10-CM | POA: Diagnosis not present

## 2018-10-22 DIAGNOSIS — R5383 Other fatigue: Secondary | ICD-10-CM | POA: Diagnosis present

## 2018-10-22 DIAGNOSIS — R0602 Shortness of breath: Secondary | ICD-10-CM | POA: Diagnosis not present

## 2018-10-22 DIAGNOSIS — Z7984 Long term (current) use of oral hypoglycemic drugs: Secondary | ICD-10-CM | POA: Insufficient documentation

## 2018-10-22 DIAGNOSIS — U071 COVID-19: Principal | ICD-10-CM | POA: Diagnosis present

## 2018-10-22 DIAGNOSIS — E782 Mixed hyperlipidemia: Secondary | ICD-10-CM | POA: Diagnosis present

## 2018-10-22 DIAGNOSIS — Z888 Allergy status to other drugs, medicaments and biological substances status: Secondary | ICD-10-CM | POA: Diagnosis not present

## 2018-10-22 DIAGNOSIS — E119 Type 2 diabetes mellitus without complications: Secondary | ICD-10-CM | POA: Insufficient documentation

## 2018-10-22 DIAGNOSIS — R531 Weakness: Secondary | ICD-10-CM | POA: Diagnosis not present

## 2018-10-22 DIAGNOSIS — R05 Cough: Secondary | ICD-10-CM | POA: Diagnosis not present

## 2018-10-22 DIAGNOSIS — J1289 Other viral pneumonia: Secondary | ICD-10-CM | POA: Diagnosis not present

## 2018-10-22 DIAGNOSIS — E1165 Type 2 diabetes mellitus with hyperglycemia: Secondary | ICD-10-CM | POA: Diagnosis not present

## 2018-10-22 LAB — CBC WITH DIFFERENTIAL/PLATELET
Abs Immature Granulocytes: 0.1 10*3/uL — ABNORMAL HIGH (ref 0.00–0.07)
Basophils Absolute: 0 10*3/uL (ref 0.0–0.1)
Basophils Relative: 0 %
Eosinophils Absolute: 0 10*3/uL (ref 0.0–0.5)
Eosinophils Relative: 0 %
HCT: 39.4 % (ref 39.0–52.0)
Hemoglobin: 13.7 g/dL (ref 13.0–17.0)
Immature Granulocytes: 1 %
Lymphocytes Relative: 5 %
Lymphs Abs: 0.4 10*3/uL — ABNORMAL LOW (ref 0.7–4.0)
MCH: 30.3 pg (ref 26.0–34.0)
MCHC: 34.8 g/dL (ref 30.0–36.0)
MCV: 87.2 fL (ref 80.0–100.0)
Monocytes Absolute: 0.2 10*3/uL (ref 0.1–1.0)
Monocytes Relative: 3 %
Neutro Abs: 6.8 10*3/uL (ref 1.7–7.7)
Neutrophils Relative %: 91 %
Platelets: 235 10*3/uL (ref 150–400)
RBC: 4.52 MIL/uL (ref 4.22–5.81)
RDW: 11.2 % — ABNORMAL LOW (ref 11.5–15.5)
WBC: 7.5 10*3/uL (ref 4.0–10.5)
nRBC: 0 % (ref 0.0–0.2)

## 2018-10-22 LAB — COMPREHENSIVE METABOLIC PANEL
ALT: 22 U/L (ref 0–44)
AST: 37 U/L (ref 15–41)
Albumin: 3.1 g/dL — ABNORMAL LOW (ref 3.5–5.0)
Alkaline Phosphatase: 52 U/L (ref 38–126)
Anion gap: 14 (ref 5–15)
BUN: 32 mg/dL — ABNORMAL HIGH (ref 8–23)
CO2: 22 mmol/L (ref 22–32)
Calcium: 8.3 mg/dL — ABNORMAL LOW (ref 8.9–10.3)
Chloride: 92 mmol/L — ABNORMAL LOW (ref 98–111)
Creatinine, Ser: 1.2 mg/dL (ref 0.61–1.24)
GFR calc Af Amer: >60 mL/min (ref >60)
GFR calc non Af Amer: >60 mL/min (ref >60)
Glucose, Bld: 224 mg/dL — ABNORMAL HIGH (ref 70–99)
Potassium: 3.8 mmol/L (ref 3.5–5.1)
Sodium: 128 mmol/L — ABNORMAL LOW (ref 135–145)
Total Bilirubin: 1.2 mg/dL (ref 0.3–1.2)
Total Protein: 7.6 g/dL (ref 6.5–8.1)

## 2018-10-22 LAB — LACTIC ACID, PLASMA
Lactic Acid, Venous: 1.6 mmol/L (ref 0.5–1.9)
Lactic Acid, Venous: 2.1 mmol/L (ref 0.5–1.9)

## 2018-10-22 LAB — LACTATE DEHYDROGENASE: LDH: 235 U/L — ABNORMAL HIGH (ref 98–192)

## 2018-10-22 LAB — C-REACTIVE PROTEIN: CRP: 24.9 mg/dL — ABNORMAL HIGH (ref <1.0)

## 2018-10-22 LAB — SARS CORONAVIRUS 2 BY RT PCR (HOSPITAL ORDER, PERFORMED IN ~~LOC~~ HOSPITAL LAB): SARS Coronavirus 2: POSITIVE — AB

## 2018-10-22 LAB — FERRITIN: Ferritin: 698 ng/mL — ABNORMAL HIGH (ref 24–336)

## 2018-10-22 LAB — FIBRIN DERIVATIVES D-DIMER (ARMC ONLY): Fibrin derivatives D-dimer (ARMC): 1689.41 ng/mL (FEU) — ABNORMAL HIGH (ref 0.00–499.00)

## 2018-10-22 LAB — TRIGLYCERIDES: Triglycerides: 98 mg/dL (ref <150)

## 2018-10-22 LAB — FIBRINOGEN: Fibrinogen: >750 mg/dL — ABNORMAL HIGH (ref 210–475)

## 2018-10-22 LAB — PROCALCITONIN: Procalcitonin: 0.97 ng/mL

## 2018-10-22 MED ORDER — INSULIN ASPART 100 UNIT/ML ~~LOC~~ SOLN
0.0000 [IU] | Freq: Every day | SUBCUTANEOUS | Status: DC
  Administered 2018-10-23 (×2): 2 [IU] via SUBCUTANEOUS

## 2018-10-22 MED ORDER — ONDANSETRON HCL 4 MG/2ML IJ SOLN
4.0000 mg | Freq: Four times a day (QID) | INTRAMUSCULAR | Status: DC | PRN
  Administered 2018-10-25: 07:00:00 4 mg via INTRAVENOUS
  Filled 2018-10-22 (×2): qty 2

## 2018-10-22 MED ORDER — SODIUM CHLORIDE 0.9% FLUSH
3.0000 mL | Freq: Two times a day (BID) | INTRAVENOUS | Status: DC
  Administered 2018-10-22 – 2018-10-28 (×12): 3 mL via INTRAVENOUS

## 2018-10-22 MED ORDER — DEXAMETHASONE SODIUM PHOSPHATE 10 MG/ML IJ SOLN
10.0000 mg | Freq: Once | INTRAMUSCULAR | Status: AC
  Administered 2018-10-22: 10 mg via INTRAVENOUS
  Filled 2018-10-22: qty 1

## 2018-10-22 MED ORDER — PRAVASTATIN SODIUM 10 MG PO TABS
10.0000 mg | ORAL_TABLET | Freq: Every day | ORAL | Status: DC
  Administered 2018-10-23 – 2018-10-27 (×5): 10 mg via ORAL
  Filled 2018-10-22 (×5): qty 1

## 2018-10-22 MED ORDER — ONDANSETRON HCL 4 MG/2ML IJ SOLN
4.0000 mg | Freq: Once | INTRAMUSCULAR | Status: AC
  Administered 2018-10-22: 4 mg via INTRAVENOUS

## 2018-10-22 MED ORDER — DEXAMETHASONE 6 MG PO TABS
6.0000 mg | ORAL_TABLET | ORAL | Status: DC
  Administered 2018-10-23 – 2018-10-27 (×5): 6 mg via ORAL
  Filled 2018-10-22 (×6): qty 1

## 2018-10-22 MED ORDER — SODIUM CHLORIDE 0.9 % IV SOLN
100.0000 mg | INTRAVENOUS | Status: AC
  Administered 2018-10-23 – 2018-10-26 (×4): 100 mg via INTRAVENOUS
  Filled 2018-10-22 (×4): qty 20

## 2018-10-22 MED ORDER — IPRATROPIUM-ALBUTEROL 20-100 MCG/ACT IN AERS
1.0000 | INHALATION_SPRAY | Freq: Four times a day (QID) | RESPIRATORY_TRACT | Status: DC | PRN
  Administered 2018-10-24: 1 via RESPIRATORY_TRACT
  Filled 2018-10-22: qty 4

## 2018-10-22 MED ORDER — SODIUM CHLORIDE 0.9 % IV SOLN
200.0000 mg | Freq: Once | INTRAVENOUS | Status: AC
  Administered 2018-10-22: 200 mg via INTRAVENOUS
  Filled 2018-10-22: qty 40

## 2018-10-22 MED ORDER — DEXAMETHASONE SODIUM PHOSPHATE 10 MG/ML IJ SOLN
6.0000 mg | Freq: Once | INTRAMUSCULAR | Status: AC
  Administered 2018-10-22: 6 mg via INTRAVENOUS
  Filled 2018-10-22: qty 1

## 2018-10-22 MED ORDER — SODIUM CHLORIDE 0.9 % IV SOLN: 250.0000 mL | INTRAVENOUS | Status: DC | PRN

## 2018-10-22 MED ORDER — INSULIN ASPART 100 UNIT/ML ~~LOC~~ SOLN
0.0000 [IU] | Freq: Three times a day (TID) | SUBCUTANEOUS | Status: DC
  Administered 2018-10-23 (×2): 5 [IU] via SUBCUTANEOUS
  Administered 2018-10-23: 8 [IU] via SUBCUTANEOUS

## 2018-10-22 MED ORDER — DOCUSATE SODIUM 100 MG PO CAPS
100.0000 mg | ORAL_CAPSULE | Freq: Two times a day (BID) | ORAL | Status: DC
  Administered 2018-10-23 – 2018-10-28 (×10): 100 mg via ORAL
  Filled 2018-10-22 (×11): qty 1

## 2018-10-22 MED ORDER — ENOXAPARIN SODIUM 40 MG/0.4ML ~~LOC~~ SOLN
40.0000 mg | Freq: Every day | SUBCUTANEOUS | Status: DC
  Administered 2018-10-22 – 2018-10-27 (×6): 40 mg via SUBCUTANEOUS
  Filled 2018-10-22 (×6): qty 0.4

## 2018-10-22 MED ORDER — ACETAMINOPHEN 325 MG PO TABS: 650.0000 mg | ORAL_TABLET | Freq: Four times a day (QID) | ORAL | Status: DC | PRN

## 2018-10-22 MED ORDER — ONDANSETRON HCL 4 MG PO TABS: 4.0000 mg | ORAL_TABLET | Freq: Four times a day (QID) | ORAL | Status: DC | PRN

## 2018-10-22 MED ORDER — SODIUM CHLORIDE 0.9% FLUSH: 3.0000 mL | INTRAVENOUS | Status: DC | PRN

## 2018-10-22 MED ORDER — SODIUM CHLORIDE 0.9 % IV SOLN
1000.0000 mL | INTRAVENOUS | Status: DC
  Administered 2018-10-22: 1000 mL via INTRAVENOUS

## 2018-10-22 NOTE — Assessment & Plan Note (Signed)
Continue him on a statin.

## 2018-10-22 NOTE — Subjective & Objective (Signed)
CC: fatigue, positive for COVD-10 HPI: 73 year old male with a history of hypertension, type 2 diabetes, hyperlipidemia presents to the [facility] ER for worsening fatigue, anorexia and cough.  Patient was diagnosed with COVID several days ago.  This was not available and the EMR so therefore he was retested [place] [facility].  He was positive for COVID.  Patient also noted to be hypoxic.  Was started on 2 L of oxygen.Chest x-ray yesterday demonstrated bilateral pulmonary opacities mostly in the left base consistent with his COVID-19 diagnosis.  Patient admits to ongoing nausea.  He is eaten very little.  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  He does not know if he has been in contact with COVID-19 patients but given his job in the [place], this most likely he has been in contact with somebody.  Patient admits to feeling feverish but has not taken his temperature.  He denies any diarrhea.

## 2018-10-22 NOTE — ED Notes (Signed)
E-signature not working at this time. Pt verbalized consent to move pt to [facility]. Pt understands the medical necessity of this transfer. Pt understands risks and benefits of transfer. No further questions at this time.

## 2018-10-22 NOTE — ED Notes (Signed)
EMTALA complete in time for pt transport.

## 2018-10-22 NOTE — Progress Notes (Signed)
Pharmacy Brief Note   O:  ALT: 22  CXR: There is subtle bilateral heterogeneous pulmonary opacity, most conspicuous in the left lung base, consistent with infection and generally in keeping with reported COVID-19.  SpO2: 99% on 2 L/min [place]   A/P:  Patient meets requirements for remdesivir therapy.  Will start  remdesivir 200 mg IV x 1  followed by 100 mg IV daily x 4 days.  Monitor ALT  Royetta Asal, PharmD, BCPS 10/22/2018 11:12 PM

## 2018-10-22 NOTE — H&P (Signed)
History and Physical    [name]. OAC:[id number] DOB: 13-Feb-1945 DOA: 10/22/2018  PCP: [name], DO   Patient coming from: [facility] ED  I have personally briefly reviewed patient's old medical records in [place]  CC: fatigue, positive for COVD-66 HPI: 73 year old male with a history of hypertension, type 2 diabetes, hyperlipidemia presents to the [facility] ER for worsening fatigue, anorexia and cough.  Patient was diagnosed with COVID several days ago.  This was not available and the EMR so therefore he was retested [place] [facility].  He was positive for COVID.  Patient also noted to be hypoxic.  Was started on 2 L of oxygen.Chest x-ray yesterday demonstrated bilateral pulmonary opacities mostly in the left base consistent with his COVID-19 diagnosis.  Patient admits to ongoing nausea.  He is eaten very little.  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  He does not know if he has been in contact with COVID-19 patients but given his job in the [place], this most likely he has been in contact with somebody.  Patient admits to feeling feverish but has not taken his temperature.  He denies any diarrhea.    ED Course: given IV decadron in ER  Review of Systems:  Review of Systems  Constitutional: Positive for fever, malaise/fatigue and weight loss.  HENT: Negative.   Eyes: Negative.   Respiratory: Positive for cough and shortness of breath.   Cardiovascular: Negative.   Gastrointestinal: Positive for nausea. Negative for abdominal pain and vomiting.  Genitourinary: Negative.   Musculoskeletal: Positive for myalgias.  Skin: Negative.   Neurological: Negative.   Endo/Heme/Allergies: Negative.   Psychiatric/Behavioral: Negative.   All other systems reviewed and are negative.   Past Medical History:  Diagnosis Date   Diabetes mellitus without complication ([place])    Erectile dysfunction    Hypertension    Right-sided chest  pain     Past Surgical History:  Procedure Laterality Date   ROTATOR CUFF REPAIR     Social History   reports that he has been smoking cigarettes. He uses smokeless tobacco. He reports current alcohol use of about 1.0 standard drinks of alcohol per week. He reports that he does not use drugs.  Allergies  Allergen Reactions   2,4-D Dimethylamine (Amisol) Other (See Comments)   Tetanus Toxoid Nausea And Vomiting   Tetanus-Diphtheria Toxoids Td Other (See Comments)    Family History  Problem Relation Age of Onset   Cancer Brother        Throat    Prior to Admission medications   Medication Sig Start Date End Date Taking? Authorizing Provider  ACCU-CHEK AVIVA PLUS test strip Check sugar up to 2 x daily 07/21/18   [name], DO  Accu-Chek FastClix Lancets MISC Check sugar up to 2 x daily 05/07/18   [name], DO  Accu-Chek Softclix Lancets lancets Check sugar up to 2 x daily 07/21/18   [name], [name], DO  benzonatate (TESSALON) 100 MG capsule Take 1 capsule (100 mg total) by mouth 3 (three) times daily as needed for cough. 10/20/18   [name], [name], DO  Blood Glucose Monitoring Suppl (ACCU-CHEK AVIVA PLUS) w/Device KIT Use to check blood sugar up to x 2 daily 05/07/18   [name], [name], DO  Dulaglutide (TRULICITY) 1.5 WF/0.9NA SOPN Inject 1.5 mg into the skin once a week. 03/13/18   [name], [name], DO  glipiZIDE (GLUCOTROL) 5 MG tablet Take 0.5 tablets (  2.5 mg total) by mouth daily before breakfast. 07/01/18   [name], [name], DO  lisinopril-hydrochlorothiazide (ZESTORETIC) 20-12.5 MG tablet Take 1 tablet by mouth daily. 05/25/18   [name], [name], DO  metFORMIN (GLUCOPHAGE) 1000 MG tablet TAKE 1 TABLET(1000 MG) BY MOUTH TWICE DAILY WITH A MEAL 06/02/18   [name], [name], DO  ondansetron (ZOFRAN) 4 MG tablet Take 1 tablet (4 mg total) by mouth every 8 (eight) hours as needed for nausea or  vomiting. 10/20/18   Par[place] Ranger, [name], DO  pravastatin (PRAVACHOL) 10 MG tablet TAKE 1 TABLET(10 MG) BY MOUTH DAILY 07/29/18   [name], [name], DO  Selenium Sulfide 2.25 % SHAM APPLY EXTERNALLY TO THE AFFECTED AREA DAILY AS NEEDED FOR IRRITATION 06/25/18   [name], [name], DO  tadalafil (ADCIRCA/CIALIS) 20 MG tablet Take 1 tablet (20 mg total) by mouth daily as needed for erectile dysfunction. 09/29/17   [name], MD    Physical Exam: Vitals:   10/22/18 2305 10/22/18 2342  BP: 119/79   Pulse: (!) 53   Resp: 20   Temp: 97.9 F (36.6 C)   TempSrc: Oral   SpO2: 99%   Weight:  83.8 kg  Height:  _0  (1.803 m)    Physical Exam  Nursing note and vitals reviewed. Constitutional: He is oriented to person, place, and time. He appears well-developed and well-nourished. No distress.  HENT:  Head: Normocephalic and atraumatic.  Eyes: Pupils are equal, round, and reactive to light. Right eye exhibits no discharge. Left eye exhibits no discharge.  Neck: Normal range of motion. No JVD present.  Cardiovascular: Normal rate and regular rhythm.  Respiratory: Effort normal. No respiratory distress. He has decreased breath sounds. He has no wheezes. He has no rhonchi. He has rales in the right lower field and the left lower field.  GI: Soft. Bowel sounds are normal. He exhibits no distension. There is no abdominal tenderness.  Musculoskeletal: Normal range of motion.        General: No deformity or edema.  Neurological: He is alert and oriented to person, place, and time.  Skin: Skin is warm and dry. He is not diaphoretic.  Psychiatric: He has a normal mood and affect. His behavior is normal. Judgment and thought content normal.     Labs on Admission: I have personally reviewed following labs and imaging studies  COVID-19 Labs  Recent Labs    10/22/18 1224  FERRITIN 698*  LDH 235*  CRP 24.9*    Lab Results  Component Value Date   SARSCOV2NAA POSITIVE (A)  10/22/2018    CBC: Recent Labs  Lab 10/22/18 1224  WBC 7.5  NEUTROABS 6.8  HGB 13.7  HCT 39.4  MCV 87.2  PLT 811   Basic Metabolic Panel: Recent Labs  Lab 10/22/18 1224  NA 128*  K 3.8  CL 92*  CO2 22  GLUCOSE 224*  BUN 32*  CREATININE 1.20  CALCIUM 8.3*   GFR: Estimated Creatinine Clearance: 59.3 mL/min (by C-G formula based on SCr of 1.2 mg/dL). Liver Function Tests: Recent Labs  Lab 10/22/18 1224  AST 37  ALT 22  ALKPHOS 52  BILITOT 1.2  PROT 7.6  ALBUMIN 3.1*   No results for input(s): LIPASE, AMYLASE in the last 168 hours. No results for input(s): AMMONIA in the last 168 hours. Coagulation Profile: No results for input(s): INR, PROTIME in the last 168 hours. Cardiac Enzymes: No results for input(s): CKTOTAL, CKMB, CKMBINDEX, TROPONINI in the last 168  hours. BNP (last 3 results) No results for input(s): PROBNP in the last 8760 hours. HbA1C: No results for input(s): HGBA1C in the last 72 hours. CBG: No results for input(s): GLUCAP in the last 168 hours. Lipid Profile: Recent Labs    10/22/18 1224  TRIG 98   Thyroid Function Tests: No results for input(s): TSH, T4TOTAL, FREET4, T3FREE, THYROIDAB in the last 72 hours. Anemia Panel: Recent Labs    10/22/18 1224  FERRITIN 698*   Urine analysis: No results found for: COLORURINE, APPEARANCEUR, LABSPEC, PHURINE, GLUCOSEU, HGBUR, BILIRUBINUR, KETONESUR, PROTEINUR, UROBILINOGEN, NITRITE, LEUKOCYTESUR  Radiological Exams on Admission: I have personally reviewed images Dg Chest Port 1 View  Result Date: 10/22/2018 CLINICAL DATA:  Weakness, hypoxia, COVID-19 positive EXAM: PORTABLE CHEST 1 VIEW COMPARISON:  None. FINDINGS: The heart size and mediastinal contours are within normal limits. There is subtle bilateral heterogeneous pulmonary opacity, most conspicuous in the left lung base. The visualized skeletal structures are unremarkable. IMPRESSION: There is subtle bilateral heterogeneous pulmonary  opacity, most conspicuous in the left lung base, consistent with infection and generally in keeping with reported COVID-19. Electronically Signed   By: Eddie Candle M.D.   On: 10/22/2018 12:47    EKG: I have personally reviewed EKG:  NSR   Assessment/Plan Principal Problem:   Pneumonia due to COVID-19 virus Active Problems:   Acute respiratory failure due to COVID-19 [facility])   Essential hypertension   Controlled type 2 diabetes mellitus with retinopathy ([place])   Hyperlipidemia associated with type 2 diabetes mellitus ([place])   Hyponatremia    Pneumonia due to COVID-19 virus Admit to [facility].  Start IV remdesivir.  He was given IV Decadron in the ER.  We will continue p.o. Decadron 4 mg daily.  PRN Combivent inhaler.  Acute respiratory failure due to COVID-19 [facility]) Continue supplemental oxygen.  Wean as tolerated.  Patient does not use oxygen at home.  Essential hypertension Stable for now.  We will hold his lisinopril/HCTZ due to his poor p.o. intake.  Monitor his blood pressure.  Controlled type 2 diabetes mellitus with retinopathy ([place]) Patient is on glipizide, metformin and Trulicity at home.  We will place him on Accu-Cheks q. before meals and at bedtime.  Place him on sliding scale insulin.  We will hold his home diabetes medicines until he is eating better.  Continue to hold his metformin while he is in the hospital.  Trulicity is nonformulary.  Hyperlipidemia associated with type 2 diabetes mellitus ([place]) Continue him on a statin.  Hyponatremia Could be due to HCTZ. Check TSH, FT4.  Holding HCTZ. Repeat CMP in AM.   DVT prophylaxis: Lovenox Code Status: Full Code Family Communication: none  Disposition Plan: DC to home when completed remdesivir and weaned off supplemental O2  Consults called: none  Admission status: Inpatient, Med-Surg   Kristopher Oppenheim, DO [facility] 10/22/2018, 11:53 PM

## 2018-10-22 NOTE — ED Notes (Signed)
Pt 88% on room air. MD stafford verbal order to place pt on 2L. Pt oxygen now 100% on 2L.

## 2018-10-22 NOTE — Assessment & Plan Note (Signed)
Continue supplemental oxygen.  Wean as tolerated.  Patient does not use oxygen at home.

## 2018-10-22 NOTE — ED Provider Notes (Signed)
[facility] Emergency Department Provider Note  ____________________________________________  Time seen: Approximately 1:29 PM  I have reviewed the triage vital signs and the nursing notes.   HISTORY  Chief Complaint Weakness    HPI [name]. is a 73 y.o. male with a history of hypertension and diabetes who comes the ED complaining of severe fatigue, decreased appetite and oral intake over last few days.  Reports that he is having generalized weakness for the past week.  Symptoms are constant, worsening, now severe.  No specific aggravating or alleviating factors.  He was told that he tested positive for corona virus 2 days ago.  Denies cough or pain.  Denies fever or chills.  Denies shortness of breath but does have dyspnea on exertion.   No vomiting or diarrhea.     Past Medical History:  Diagnosis Date  . Diabetes mellitus without complication ([place])   . Erectile dysfunction   . Hypertension   . Right-sided chest pain      Patient Active Problem List   Diagnosis Date Noted  . External hemorrhoid 10/14/2017  . Diabetic retinopathy ([place]) 08/02/2016  . Acne vulgaris 12/06/2015  . Controlled type 2 diabetes mellitus with retinopathy ([place]) 07/19/2014  . ED (erectile dysfunction) of organic origin 11/28/2011  . Essential hypertension 11/28/2011  . Hyperlipidemia associated with type 2 diabetes mellitus ([place]) 11/28/2011     Past Surgical History:  Procedure Laterality Date  . ROTATOR CUFF REPAIR       Prior to Admission medications   Medication Sig Start Date End Date Taking? Authorizing Provider  ACCU-CHEK AVIVA PLUS test strip Check sugar up to 2 x daily 07/21/18   [name], DO  Accu-Chek FastClix Lancets MISC Check sugar up to 2 x daily 05/07/18   [name], DO  Accu-Chek Softclix Lancets lancets Check sugar up to 2 x daily 07/21/18   [name], [name], DO  benzonatate (TESSALON) 100 MG capsule Take 1  capsule (100 mg total) by mouth 3 (three) times daily as needed for cough. 10/20/18   [name], [name], DO  Blood Glucose Monitoring Suppl (ACCU-CHEK AVIVA PLUS) w/Device KIT Use to check blood sugar up to x 2 daily 05/07/18   [name], [name], DO  Dulaglutide (TRULICITY) 1.5 QZ/0.0PQ SOPN Inject 1.5 mg into the skin once a week. 03/13/18   [name], [name], DO  glipiZIDE (GLUCOTROL) 5 MG tablet Take 0.5 tablets (2.5 mg total) by mouth daily before breakfast. 07/01/18   [name], [name], DO  lisinopril-hydrochlorothiazide (ZESTORETIC) 20-12.5 MG tablet Take 1 tablet by mouth daily. 05/25/18   [name], [name], DO  metFORMIN (GLUCOPHAGE) 1000 MG tablet TAKE 1 TABLET(1000 MG) BY MOUTH TWICE DAILY WITH A MEAL 06/02/18   [name], [name], DO  ondansetron (ZOFRAN) 4 MG tablet Take 1 tablet (4 mg total) by mouth every 8 (eight) hours as needed for nausea or vomiting. 10/20/18   Parks Ranger, [name], DO  pravastatin (PRAVACHOL) 10 MG tablet TAKE 1 TABLET(10 MG) BY MOUTH DAILY 07/29/18   [name], [name], DO  Selenium Sulfide 2.25 % SHAM APPLY EXTERNALLY TO THE AFFECTED AREA DAILY AS NEEDED FOR IRRITATION 06/25/18   [name], [name], DO  tadalafil (ADCIRCA/CIALIS) 20 MG tablet Take 1 tablet (20 mg total) by mouth daily as needed for erectile dysfunction. 09/29/17   Stoioff, Ronda Fairly, MD     Allergies 2,4-d dimethylamine (amisol); Tetanus toxoid; and Tetanus-diphtheria toxoids td   Family History  Problem Relation  Age of Onset  . Cancer Brother        Throat    Social History Social History   Tobacco Use  . Smoking status: Current Some Day Smoker    Types: Cigarettes  . Smokeless tobacco: Current User  . Tobacco comment: pack of cigarettes last 1-2 months - doesnt inhale  Substance Use Topics  . Alcohol use: Yes    Alcohol/week: 1.0 standard drinks    Types: 1 Cans of beer per week    Comment: 1-2 beers week   . Drug use: No    Review  of Systems  Constitutional:   No fever or chills.  ENT:   No sore throat. No rhinorrhea. Cardiovascular:   No chest pain or syncope. Respiratory:   No dyspnea or cough. Gastrointestinal:   Negative for abdominal pain, vomiting and diarrhea.  Musculoskeletal:   Negative for focal pain or swelling All other systems reviewed and are negative except as documented above in ROS and HPI.  ____________________________________________   PHYSICAL EXAM:  VITAL SIGNS: ED Triage Vitals  Enc Vitals Group     BP 10/22/18 1230 121/70     Pulse Rate 10/22/18 1230 82     Resp 10/22/18 1230 (!) 26     Temp 10/22/18 1226 (!) 100.9 F (38.3 C)     Temp Source 10/22/18 1226 Oral     SpO2 10/22/18 1230 98 %     Weight 10/22/18 1219 185 lb (83.9 kg)     Height 10/22/18 1219 5' 11"  (1.803 m)     Head Circumference --      Peak Flow --      Pain Score 10/22/18 1219 3     Pain Loc --      Pain Edu? --      Excl. in [place]? --     Vital signs reviewed, nursing assessments reviewed.   Constitutional:   Alert and oriented. Non-toxic appearance. Eyes:   Conjunctivae are normal. EOMI. PERRL. ENT      Head:   Normocephalic and atraumatic.      Nose:   Wearing a mask.      Mouth/Throat:   Wearing a mask.      Neck:   No meningismus. Full ROM. Hematological/Lymphatic/Immunilogical:   No cervical lymphadenopathy. Cardiovascular:   RRR. Symmetric bilateral radial and DP pulses.  No murmurs. Cap refill less than 2 seconds. Respiratory: Tachypnea.  Normal work of breathing.  Breath sounds are clear and equal bilaterally. No wheezes/rales/rhonchi. Gastrointestinal:   Soft and nontender. Non distended. There is no CVA tenderness.  No rebound, rigidity, or guarding. Musculoskeletal:   Normal range of motion in all extremities. No joint effusions.  No lower extremity tenderness.  No edema. Neurologic:   Normal speech and language.  Motor grossly intact. No acute focal neurologic deficits are appreciated.   Skin:    Skin is warm, dry and intact. No rash noted.  No petechiae, purpura, or bullae.  ____________________________________________    LABS (pertinent positives/negatives) (all labs ordered are listed, but only abnormal results are displayed) Labs Reviewed  LACTIC ACID, PLASMA - Abnormal; Notable for the following components:      Result Value   Lactic Acid, Venous 2.1 (*)    All other components within normal limits  CBC WITH DIFFERENTIAL/PLATELET - Abnormal; Notable for the following components:   RDW 11.2 (*)    Lymphs Abs 0.4 (*)    Abs Immature Granulocytes 0.10 (*)    All other  components within normal limits  FIBRIN DERIVATIVES D-DIMER ([facility] ONLY) - Abnormal; Notable for the following components:   Fibrin derivatives D-dimer [facility]) 1,689.41 (*)    All other components within normal limits  FIBRINOGEN - Abnormal; Notable for the following components:   Fibrinogen >750 (*)    All other components within normal limits  SARS CORONAVIRUS 2 (HOSPITAL ORDER, [facility])  CULTURE, BLOOD (ROUTINE X 2)  CULTURE, BLOOD (ROUTINE X 2)  LACTIC ACID, PLASMA  COMPREHENSIVE METABOLIC PANEL  PROCALCITONIN  LACTATE DEHYDROGENASE  FERRITIN  TRIGLYCERIDES  C-REACTIVE PROTEIN   ____________________________________________   EKG  Interpreted by me Normal sinus rhythm rate of 85, normal axis and intervals.  Normal QRS ST segments and T waves.  ____________________________________________    [id number]  Dg Chest Port 1 View  Result Date: 10/22/2018 CLINICAL DATA:  Weakness, hypoxia, COVID-19 positive EXAM: PORTABLE CHEST 1 VIEW COMPARISON:  None. FINDINGS: The heart size and mediastinal contours are within normal limits. There is subtle bilateral heterogeneous pulmonary opacity, most conspicuous in the left lung base. The visualized skeletal structures are unremarkable. IMPRESSION: There is subtle bilateral heterogeneous pulmonary opacity, most  conspicuous in the left lung base, consistent with infection and generally in keeping with reported COVID-19. Electronically Signed   By: Eddie Candle M.D.   On: 10/22/2018 12:47    ____________________________________________   PROCEDURES Procedures  ____________________________________________    CLINICAL IMPRESSION / ASSESSMENT AND PLAN / ED COURSE  Medications ordered in the ED: Medications  0.9 %  sodium chloride infusion (1,000 mLs Intravenous New Bag/Given 10/22/18 1257)  dexamethasone (DECADRON) injection 10 mg (10 mg Intravenous Given 10/22/18 1255)    Pertinent labs & imaging results that were available during my care of the patient were reviewed by me and considered in my medical decision making (see chart for details).  [name]. was evaluated in Emergency Department on 10/22/2018 for the symptoms described in the history of present illness. He was evaluated in the context of the global COVID-19 pandemic, which necessitated consideration that the patient might be at risk for infection with the SARS-CoV-2 virus that causes COVID-19. Institutional protocols and algorithms that pertain to the evaluation of patients at risk for COVID-19 are in a state of rapid change based on information released by regulatory bodies including the CDC and federal and state organizations. These policies and algorithms were followed during the patient's care in the ED.   Patient presents with generalized weakness, found to be hypoxic and febrile, in the setting of recently diagnosed COVID-19 infection.  I am unable to verify the COVID-19 test in our electronic medical record system.  Chest x-ray confirms diffuse hazy opacities consistent with COVID.  Oxygenation is improved on nasal cannula.  Checking labs, plan for hospitalization.  I will give IV Decadron for now.  Considering the patient's symptoms, medical history, and physical examination today, I have low suspicion for ACS, PE, TAD,  pneumothorax, carditis, mediastinitis, bacterial pneumonia, CHF, or sepsis.    Clinical Course as of Oct 21 1401  Thu Oct 22, 2018  1338 Presentation very consistent with COVID pneumonitis. Will defer abx for now given low suspicion of bacterial pneumonia.    [PS]    Clinical Course User Index [PS] Carrie Mew, MD     ----------------------------------------- 2:03 PM on 10/22/2018 -----------------------------------------  Discussed with [facility] hospitalist who accepts for transfer.  No further recommendations for now, agrees with holding antibiotics.  ____________________________________________   FINAL CLINICAL IMPRESSION(S) /  ED DIAGNOSES    Final diagnoses:  APOLI-10 virus infection  Acute respiratory failure with hypoxia [facility])     ED Discharge Orders    None      Portions of this note were generated with dragon dictation software. Dictation errors may occur despite best attempts at proofreading.   Carrie Mew, MD 10/22/18 [phone]

## 2018-10-22 NOTE — Assessment & Plan Note (Addendum)
Patient is on glipizide, metformin and Trulicity at home.  We will place him on Accu-Cheks q. before meals and at bedtime.  Place him on sliding scale insulin.  We will hold his home diabetes medicines until he is eating better.  Continue to hold his metformin while he is in the hospital.  Trulicity is nonformulary.

## 2018-10-22 NOTE — ED Triage Notes (Signed)
Pt presents from home via acems with c/o weakness. Pt tested positive for corona virus two days ago. Pt temp 99.7 for ems. hx of diabetes and hypertension. Pt oxygen 93% on room air for ems. Pt currently alert and oriented x4.

## 2018-10-22 NOTE — Assessment & Plan Note (Signed)
Could be due to HCTZ. Check TSH, FT4.  Holding HCTZ. Repeat CMP in AM.

## 2018-10-22 NOTE — Assessment & Plan Note (Signed)
Admit to [facility].  Start IV remdesivir.  He was given IV Decadron in the ER.  We will continue p.o. Decadron 4 mg daily.  PRN Combivent inhaler.

## 2018-10-22 NOTE — Chronic Care Management (AMB) (Signed)
Chronic Care Management   Follow Up Note   10/22/2018 Name: [name]. MRN: [id number] DOB: [birth year]  Referred by: [name], DO Reason for referral : Care Coordination   Nyko Gell. is a 73 y.o. year old male who is a primary care patient of [name], DO. The CCM team was consulted for assistance with chronic disease management and care coordination needs.    Perform chart review. Mr. Millspaugh currently admitted to [facility] ED, positive for COVID-19 infection.  Review of patient status, including review of consultants reports, relevant laboratory and other test results, and collaboration with appropriate care team members and the patient's provider was performed as part of comprehensive patient evaluation and provision of chronic care management services.     Facility-Administered Encounter Medications as of 10/22/2018  Medication  . 0.9 %  sodium chloride infusion   Outpatient Encounter Medications as of 10/22/2018  Medication Sig Note  . ACCU-CHEK AVIVA PLUS test strip Check sugar up to 2 x daily   . Accu-Chek FastClix Lancets MISC Check sugar up to 2 x daily   . Accu-Chek Softclix Lancets lancets Check sugar up to 2 x daily   . benzonatate (TESSALON) 100 MG capsule Take 1 capsule (100 mg total) by mouth 3 (three) times daily as needed for cough.   . Blood Glucose Monitoring Suppl (ACCU-CHEK AVIVA PLUS) w/Device KIT Use to check blood sugar up to x 2 daily   . Dulaglutide (TRULICITY) 1.5 IR/5.1OA SOPN Inject 1.5 mg into the skin once a week. 07/01/2018: On Tuesdays  . glipiZIDE (GLUCOTROL) 5 MG tablet Take 0.5 tablets (2.5 mg total) by mouth daily before breakfast.   . lisinopril-hydrochlorothiazide (ZESTORETIC) 20-12.5 MG tablet Take 1 tablet by mouth daily.   . metFORMIN (GLUCOPHAGE) 1000 MG tablet TAKE 1 TABLET(1000 MG) BY MOUTH TWICE DAILY WITH A MEAL   . ondansetron (ZOFRAN) 4 MG tablet Take 1 tablet (4 mg total) by mouth every 8  (eight) hours as needed for nausea or vomiting.   . pravastatin (PRAVACHOL) 10 MG tablet TAKE 1 TABLET(10 MG) BY MOUTH DAILY   . Selenium Sulfide 2.25 % SHAM APPLY EXTERNALLY TO THE AFFECTED AREA DAILY AS NEEDED FOR IRRITATION   . tadalafil (ADCIRCA/CIALIS) 20 MG tablet Take 1 tablet (20 mg total) by mouth daily as needed for erectile dysfunction.     Goals Addressed            This Visit's Progress   . PharmD - "I want to get my A1C down" (pt-stated)       Current Barriers:  [name] Medication Adherence  . Financial Barriers . Knowledge deficits related to nutrition with diabetes  Pharmacist Clinical Goal(s):  [name] Over the next 30 days, patient will continue to work with PharmD to address needs related to address needs related to diabetes management.  . Over the next 30 days, patient will demonstrate maintained medication adherence as evidenced by patient report of adherence  Interventions:   . Perform chart review o Mr. Norris presented to [facility] ED via EMS for COVID-19 infection. Scheduled to be transferred to [facility]. . Will collaborate with CM Care Team to let them know of patient's current admission  Patient Self Care Activities:  . Self administers all medications as prescribed . To check blood sugars daily to twice daily and keep log. . To contact pharmacy to order refills of medication . Calls provider office for new concerns or questions. . Patient to  eat regular meals  Please see past updates related to this goal by clicking on the "Past Updates" button in the selected goal         Plan  The care management team will reach out to the patient again over the next 14 days.   Harlow Asa, PharmD, [place] [employer] [phone]

## 2018-10-22 NOTE — Assessment & Plan Note (Signed)
Stable for now.  We will hold his lisinopril/HCTZ due to his poor p.o. intake.  Monitor his blood pressure.

## 2018-10-23 ENCOUNTER — Inpatient Hospital Stay (HOSPITAL_COMMUNITY): Payer: Medicare HMO

## 2018-10-23 DIAGNOSIS — E785 Hyperlipidemia, unspecified: Secondary | ICD-10-CM

## 2018-10-23 DIAGNOSIS — E1169 Type 2 diabetes mellitus with other specified complication: Secondary | ICD-10-CM

## 2018-10-23 LAB — D-DIMER, QUANTITATIVE: D-Dimer, Quant: 1.86 ug/mL-FEU — ABNORMAL HIGH (ref 0.00–0.50)

## 2018-10-23 LAB — COMPREHENSIVE METABOLIC PANEL
ALT: 21 U/L (ref 0–44)
AST: 28 U/L (ref 15–41)
Albumin: 2.7 g/dL — ABNORMAL LOW (ref 3.5–5.0)
Alkaline Phosphatase: 52 U/L (ref 38–126)
Anion gap: 12 (ref 5–15)
BUN: 30 mg/dL — ABNORMAL HIGH (ref 8–23)
CO2: 24 mmol/L (ref 22–32)
Calcium: 8.2 mg/dL — ABNORMAL LOW (ref 8.9–10.3)
Chloride: 96 mmol/L — ABNORMAL LOW (ref 98–111)
Creatinine, Ser: 0.94 mg/dL (ref 0.61–1.24)
GFR calc Af Amer: >60 mL/min (ref >60)
GFR calc non Af Amer: >60 mL/min (ref >60)
Glucose, Bld: 285 mg/dL — ABNORMAL HIGH (ref 70–99)
Potassium: 4 mmol/L (ref 3.5–5.1)
Sodium: 132 mmol/L — ABNORMAL LOW (ref 135–145)
Total Bilirubin: 0.7 mg/dL (ref 0.3–1.2)
Total Protein: 6.9 g/dL (ref 6.5–8.1)

## 2018-10-23 LAB — FERRITIN: Ferritin: 774 ng/mL — ABNORMAL HIGH (ref 24–336)

## 2018-10-23 LAB — T4, FREE: Free T4: 1.18 ng/dL — ABNORMAL HIGH (ref 0.61–1.12)

## 2018-10-23 LAB — CBC WITH DIFFERENTIAL/PLATELET
Abs Immature Granulocytes: 0.05 10*3/uL (ref 0.00–0.07)
Basophils Absolute: 0 10*3/uL (ref 0.0–0.1)
Basophils Relative: 0 %
Eosinophils Absolute: 0 10*3/uL (ref 0.0–0.5)
Eosinophils Relative: 0 %
HCT: 39.3 % (ref 39.0–52.0)
Hemoglobin: 13.1 g/dL (ref 13.0–17.0)
Immature Granulocytes: 1 %
Lymphocytes Relative: 8 %
Lymphs Abs: 0.4 10*3/uL — ABNORMAL LOW (ref 0.7–4.0)
MCH: 30.3 pg (ref 26.0–34.0)
MCHC: 33.3 g/dL (ref 30.0–36.0)
MCV: 90.8 fL (ref 80.0–100.0)
Monocytes Absolute: 0.1 10*3/uL (ref 0.1–1.0)
Monocytes Relative: 2 %
Neutro Abs: 4.6 10*3/uL (ref 1.7–7.7)
Neutrophils Relative %: 89 %
Platelets: 269 10*3/uL (ref 150–400)
RBC: 4.33 MIL/uL (ref 4.22–5.81)
RDW: 11.4 % — ABNORMAL LOW (ref 11.5–15.5)
WBC: 5.1 10*3/uL (ref 4.0–10.5)
nRBC: 0 % (ref 0.0–0.2)

## 2018-10-23 LAB — C-REACTIVE PROTEIN: CRP: 23 mg/dL — ABNORMAL HIGH (ref <1.0)

## 2018-10-23 LAB — GLUCOSE, CAPILLARY
Glucose-Capillary: 223 mg/dL — ABNORMAL HIGH (ref 70–99)
Glucose-Capillary: 227 mg/dL — ABNORMAL HIGH (ref 70–99)
Glucose-Capillary: 242 mg/dL — ABNORMAL HIGH (ref 70–99)
Glucose-Capillary: 245 mg/dL — ABNORMAL HIGH (ref 70–99)
Glucose-Capillary: 246 mg/dL — ABNORMAL HIGH (ref 70–99)

## 2018-10-23 LAB — TSH: TSH: 0.621 u[IU]/mL (ref 0.350–4.500)

## 2018-10-23 LAB — ABO/RH: ABO/RH(D): O POS

## 2018-10-23 LAB — HEMOGLOBIN A1C
Hgb A1c MFr Bld: 6.3 % — ABNORMAL HIGH (ref 4.8–5.6)
Mean Plasma Glucose: 134.11 mg/dL

## 2018-10-23 MED ORDER — SODIUM CHLORIDE 0.9 % IV SOLN
500.0000 mg | INTRAVENOUS | Status: AC
  Administered 2018-10-23 – 2018-10-27 (×5): 500 mg via INTRAVENOUS
  Filled 2018-10-23 (×5): qty 500

## 2018-10-23 MED ORDER — SODIUM CHLORIDE 0.9 % IV SOLN
2.0000 g | INTRAVENOUS | Status: AC
  Administered 2018-10-23 – 2018-10-27 (×5): 2 g via INTRAVENOUS
  Filled 2018-10-23 (×5): qty 20

## 2018-10-23 NOTE — Plan of Care (Signed)

## 2018-10-23 NOTE — Progress Notes (Addendum)
PROGRESS NOTE  Christopher Bruinsobert Novitsky Jr.  ZOX:[id number] DOB: 06/05/1945 DOA: 10/22/2018 PCP: [name], Alexander J, DO  Brief Narrative: Christopher BruinsRobert Holsworth Jr. is a 73 y.o. male with a history of T2DM, HTN, HLD and covid-19 diagnosis 9/29 who presented to [facility] ED 10/1 for progressive fatigue and poor per oral intake with nausea in the setting of diffuse weakness and nonproductive cough starting about 1 week prior. In the ED he was found to be febrile with hypoxia with diffuse opacities on CXR with LLL predominance. Decadron was given and the patient was [facility] after SARS-CoV-2 PCR was confirmed to be positive. Remdesivir was started on arrival and antibiotics have been added on 10/2.  Assessment & Plan: Principal Problem:   Pneumonia due to COVID-19 virus Active Problems:   Essential hypertension   Hyperlipidemia associated with type 2 diabetes mellitus (HCC)   Controlled type 2 diabetes mellitus with retinopathy (HCC)   Acute respiratory failure due to COVID-19 (HCC)   Hyponatremia   Acute hypoxic respiratory failure due to covid-19 pneumonia, possible superimposed CAP: Bilateral CXR infiltrates and lymphopenia without neutrophilia, in addition to other findings, is more consistent with covid infection alone. Predominance of LLL opacity, mild leftward shift, and elevated proclacitonin (0.97) argue for bacterial component as well.  - Treat as covid and CAP with resmdesivir (10/1 - 10/5), steroids (10/1 - 10/10) and CTX/azithromycin (10/2 - 10/6).  - Wean oxygen as tolerated, suspect a significant exertional component to hypoxia based on history of minimal shortness of breath at rest. Check ambulatory pulse oximetry. - Given tobacco use history and +FH of CA, would recommend repeating imaging after convalescence to confirm resolution of infiltrate.  - With severe CRP elevation, upward trending ferritin, have low threshold to give CCP under EUA if hypoxia progresses.  - Continue airborne, contact  precautions. PPE including surgical gown, gloves, cap, shoe covers, and CAPR used during this encounter in a negative pressure room.  - Check daily labs: CBC w/diff, CMP, d-dimer, ferritin, CRP.  - Blood cultures drawn on admission, NGTD.  - Maintain euvolemia/net negative.  - Avoid NSAIDs - Recommend proning and aggressive use of incentive spirometry.  T2DM: HbA1c 6.3%. - Holding glipizide, metformin, trulicity and instead giving moderate SSI while admitted. Will augment regimen based on currently elevated CBGs and anticipated steroid-induced hyperglycemia. May initiate lantus qHS.  HTN:  - Holding ACEi and thiazide for now. Currently normotensive.  Hyperlipidemia:  - Continue statin. LFTs wnl and will be monitored while on remdesivir.   Hyponatremia: With poor per oral intake and concomitant diuretic use, suspect hypovolemic etiology which is improving.  - Continue to monitor. Holding diuretic as above. No indication for further work up at this time.  DVT prophylaxis: Lovenox 40mg  q24h (~0.5mg /kg) Code Status: Full Family Communication: None at bedside Disposition Plan: Uncertain. Will get PT/OT evaluations. Earliest date of discharge would be 10/5PM, more likely to be 10/6.   Consultants:   None  Procedures:   None  Antimicrobials: Remdesivir 10/1 - 10/5 Ceftriaxone, azithromycin 10/2 >>   Subjective: Fatigued but nausea is improved. Denies shortness of breath or cough this morning. No chest pain, palpitations, or syncope.   Objective: Vitals:   10/22/18 2305 10/22/18 2342 10/23/18 0401 10/23/18 0903  BP: 119/79  116/77 111/71  Pulse: (!) 53  (!) 52 (!) 54  Resp: 20  19 20   Temp: 97.9 F (36.6 C)  98.2 F (36.8 C) 97.9 F (36.6 C)  TempSrc: Oral  Oral Oral  SpO2: 99%  93% 94%  Weight:  83.8 kg    Height:  5\' 11"  (1.803 m)      Intake/Output Summary (Last 24 hours) at 10/23/2018 12/23/2018 Last data filed at 10/23/2018 0405 Gross per 24 hour  Intake 236.52 ml   Output -  Net 236.52 ml   Filed Weights   10/22/18 2342  Weight: 83.8 kg    Gen: 73 y.o. male in no distress Pulm: Non-labored breathing at rest, hypoxia just with rising for posterior pulmonary exam. Diminished globally without wheezes, L > R crackles.  CV: Regular bradycardia in high 50'2 bpm. No murmur, rub, or gallop. No JVD, no pedal edema. GI: Abdomen soft, non-tender, non-distended, with normoactive bowel sounds. No organomegaly or masses felt. Ext: Warm, no deformities Skin: No rashes, lesions or ulcers Neuro: Alert and oriented. No focal neurological deficits. Psych: Judgement and insight appear normal. Mood & affect appropriate.   Data Reviewed: I have personally reviewed following labs and imaging studies  CBC: Recent Labs  Lab 10/22/18 1224 10/23/18 0256  WBC 7.5 5.1  NEUTROABS 6.8 4.6  HGB 13.7 13.1  HCT 39.4 39.3  MCV 87.2 90.8  PLT 235 269   Basic Metabolic Panel: Recent Labs  Lab 10/22/18 1224 10/23/18 0256  NA 128* 132*  K 3.8 4.0  CL 92* 96*  CO2 22 24  GLUCOSE 224* 285*  BUN 32* 30*  CREATININE 1.20 0.94  CALCIUM 8.3* 8.2*   GFR: Estimated Creatinine Clearance: 75.7 mL/min (by C-G formula based on SCr of 0.94 mg/dL). Liver Function Tests: Recent Labs  Lab 10/22/18 1224 10/23/18 0256  AST 37 28  ALT 22 21  ALKPHOS 52 52  BILITOT 1.2 0.7  PROT 7.6 6.9  ALBUMIN 3.1* 2.7*   No results for input(s): LIPASE, AMYLASE in the last 168 hours. No results for input(s): AMMONIA in the last 168 hours. Coagulation Profile: No results for input(s): INR, PROTIME in the last 168 hours. Cardiac Enzymes: No results for input(s): CKTOTAL, CKMB, CKMBINDEX, TROPONINI in the last 168 hours. BNP (last 3 results) No results for input(s): PROBNP in the last 8760 hours. HbA1C: Recent Labs    10/23/18 0256  HGBA1C 6.3*   CBG: Recent Labs  Lab 10/23/18 0003 10/23/18 0740  GLUCAP 242* 246*   Lipid Profile: Recent Labs    10/22/18 1224  TRIG 98    Thyroid Function Tests: Recent Labs    10/23/18 0256  TSH 0.621   Anemia Panel: Recent Labs    10/22/18 1224 10/23/18 0256  FERRITIN 698* 774*   Urine analysis: No results found for: COLORURINE, APPEARANCEUR, LABSPEC, PHURINE, GLUCOSEU, HGBUR, BILIRUBINUR, KETONESUR, PROTEINUR, UROBILINOGEN, NITRITE, LEUKOCYTESUR Recent Results (from the past 240 hour(s))  [facility] Coronavirus [facility], Performed in [facility] hospital lab) Nasopharyngeal Nasopharyngeal Swab     Status: Abnormal   Collection Time: 10/22/18 12:39 PM   Specimen: Nasopharyngeal Swab  Result Value Ref Range Status   SARS Coronavirus 2 POSITIVE (A) NEGATIVE Final    Comment: RESULT CALLED TO, READ BACK BY AND VERIFIED WITH: ANNIE SMITH AT 1358 ON 10/22/2018 [facility]. (NOTE) If result is NEGATIVE SARS-CoV-2 target nucleic acids are NOT DETECTED. The SARS-CoV-2 RNA is generally detectable in upper and lower  respiratory specimens during the acute phase of infection. The lowest  concentration of SARS-CoV-2 viral copies this assay can detect is 250  copies / mL. A negative result does not preclude SARS-CoV-2 infection  and should not be used as the sole basis for treatment  or other  patient management decisions.  A negative result may occur with  improper specimen collection / handling, submission of specimen other  than nasopharyngeal swab, presence of viral mutation(s) within the  areas targeted by this assay, and inadequate number of viral copies  (<250 copies / mL). A negative result must be combined with clinical  observations, patient history, and epidemiological information. If result is POSITIVE SARS-CoV-2 target nucleic acids are DETECTED.  The SARS-CoV-2 RNA is generally detectable in upper and lower  respiratory specimens during the acute phase of infection.  Positive  results are indicative of active infection with SARS-CoV-2.  Clinical  correlation with patient history and other diagnostic  information is  necessary to determine patient infection status.  Positive results do  not rule out bacterial infection or co-infection with other viruses. If result is PRESUMPTIVE POSTIVE SARS-CoV-2 nucleic acids MAY BE PRESENT.   A presumptive positive result was obtained on the submitted specimen  and confirmed on repeat testing.  While 2019 novel coronavirus  (SARS-CoV-2) nucleic acids may be present in the submitted sample  additional confirmatory testing may be necessary for epidemiological  and / or clinical management purposes  to differentiate between  SARS-CoV-2 and other Sarbecovirus currently known to infect humans.  If clinically indicated additional testing with an alternate test  methodology [phone])  is advised. The SARS-CoV-2 RNA is generally  detectable in upper and lower respiratory specimens during the acute  phase of infection. The expected result is Negative. Fact Sheet for Patients:  StrictlyIdeas.no Fact Sheet for Healthcare Providers: BankingDealers.co.za This test is not yet approved or cleared by the [place] FDA and has been authorized for detection and/or diagnosis of SARS-CoV-2 by FDA under an Emergency Use Authorization (EUA).  This EUA will remain in effect (meaning this test can be used) for the duration of the COVID-19 declaration under Section 564(b)(1) of the Act, 21 U.S.C. section 360bbb-3(b)(1), unless the authorization is terminated or revoked sooner. Performed at [facility], [address] [place], [place] [address]   Blood Culture (routine x 2)     Status: None (Preliminary result)   Collection Time: 10/22/18 12:39 PM   Specimen: BLOOD  Result Value Ref Range Status   Specimen Description BLOOD BLOOD LEFT FOREARM  Final   Special Requests   Final    BOTTLES DRAWN AEROBIC AND ANAEROBIC Blood Culture adequate volume   Culture   Final    NO GROWTH < 24 HOURS Performed at  [facility], [address], [place], [place] [address]    Report Status PENDING  Incomplete  Blood Culture (routine x 2)     Status: None (Preliminary result)   Collection Time: 10/22/18 12:39 PM   Specimen: BLOOD  Result Value Ref Range Status   Specimen Description BLOOD LEFT ANTECUBITAL  Final   Special Requests   Final    BOTTLES DRAWN AEROBIC AND ANAEROBIC Blood Culture adequate volume   Culture   Final    NO GROWTH < 24 HOURS Performed at [facility], [address] [place], [place] [address]    Report Status PENDING  Incomplete      Radiology Studies: Portable Chest 1 View  Result Date: 10/23/2018 CLINICAL DATA:  Shortness of breath EXAM: PORTABLE CHEST 1 VIEW COMPARISON:  10/22/2018 FINDINGS: Cardiac shadow is stable but accentuated by the portable technique. The right lung is clear. Patchy parenchymal density is noted throughout the mid left lung similar to that seen on the  prior exam consistent with the given clinical history. No bony abnormality is noted. IMPRESSION: Stable left-sided infiltrate with improved aeration in the right lung. Electronically Signed   By: Alcide Clever M.D.   On: 10/23/2018 08:27   Dg Chest Port 1 View  Result Date: 10/22/2018 CLINICAL DATA:  Weakness, hypoxia, COVID-19 positive EXAM: PORTABLE CHEST 1 VIEW COMPARISON:  None. FINDINGS: The heart size and mediastinal contours are within normal limits. There is subtle bilateral heterogeneous pulmonary opacity, most conspicuous in the left lung base. The visualized skeletal structures are unremarkable. IMPRESSION: There is subtle bilateral heterogeneous pulmonary opacity, most conspicuous in the left lung base, consistent with infection and generally in keeping with reported COVID-19. Electronically Signed   By: Lauralyn Primes M.D.   On: 10/22/2018 12:47    Scheduled Meds: . dexamethasone  6 mg Oral Q24H  . docusate sodium  100 mg Oral BID  . enoxaparin (LOVENOX) injection  40 mg  Subcutaneous QHS  . insulin aspart  0-15 Units Subcutaneous TID WC  . insulin aspart  0-5 Units Subcutaneous QHS  . pravastatin  10 mg Oral q1800  . sodium chloride flush  3 mL Intravenous Q12H   Continuous Infusions: . sodium chloride    . azithromycin 500 mg (10/23/18 0920)  . cefTRIAXone (ROCEPHIN)  IV 2 g (10/23/18 0927)  . remdesivir 100 mg in NS 250 mL       LOS: 1 day   Time spent: 35 minutes.  Tyrone Nine, MD [facility] [email or web address] Password [facility] 10/23/2018, 9:38 AM

## 2018-10-24 LAB — CBC WITH DIFFERENTIAL/PLATELET
Abs Immature Granulocytes: 0.07 10*3/uL (ref 0.00–0.07)
Basophils Absolute: 0 10*3/uL (ref 0.0–0.1)
Basophils Relative: 0 %
Eosinophils Absolute: 0 10*3/uL (ref 0.0–0.5)
Eosinophils Relative: 0 %
HCT: 40.9 % (ref 39.0–52.0)
Hemoglobin: 13.6 g/dL (ref 13.0–17.0)
Immature Granulocytes: 1 %
Lymphocytes Relative: 7 %
Lymphs Abs: 0.5 10*3/uL — ABNORMAL LOW (ref 0.7–4.0)
MCH: 30.2 pg (ref 26.0–34.0)
MCHC: 33.3 g/dL (ref 30.0–36.0)
MCV: 90.9 fL (ref 80.0–100.0)
Monocytes Absolute: 0.4 10*3/uL (ref 0.1–1.0)
Monocytes Relative: 5 %
Neutro Abs: 6.1 10*3/uL (ref 1.7–7.7)
Neutrophils Relative %: 87 %
Platelets: 315 10*3/uL (ref 150–400)
RBC: 4.5 MIL/uL (ref 4.22–5.81)
RDW: 11.4 % — ABNORMAL LOW (ref 11.5–15.5)
WBC: 7 10*3/uL (ref 4.0–10.5)
nRBC: 0 % (ref 0.0–0.2)

## 2018-10-24 LAB — COMPREHENSIVE METABOLIC PANEL
ALT: 24 U/L (ref 0–44)
AST: 27 U/L (ref 15–41)
Albumin: 2.7 g/dL — ABNORMAL LOW (ref 3.5–5.0)
Alkaline Phosphatase: 50 U/L (ref 38–126)
Anion gap: 10 (ref 5–15)
BUN: 34 mg/dL — ABNORMAL HIGH (ref 8–23)
CO2: 25 mmol/L (ref 22–32)
Calcium: 8.4 mg/dL — ABNORMAL LOW (ref 8.9–10.3)
Chloride: 99 mmol/L (ref 98–111)
Creatinine, Ser: 0.95 mg/dL (ref 0.61–1.24)
GFR calc Af Amer: >60 mL/min (ref >60)
GFR calc non Af Amer: >60 mL/min (ref >60)
Glucose, Bld: 200 mg/dL — ABNORMAL HIGH (ref 70–99)
Potassium: 4.5 mmol/L (ref 3.5–5.1)
Sodium: 134 mmol/L — ABNORMAL LOW (ref 135–145)
Total Bilirubin: 0.4 mg/dL (ref 0.3–1.2)
Total Protein: 6.6 g/dL (ref 6.5–8.1)

## 2018-10-24 LAB — FERRITIN: Ferritin: 714 ng/mL — ABNORMAL HIGH (ref 24–336)

## 2018-10-24 LAB — GLUCOSE, CAPILLARY
Glucose-Capillary: 140 mg/dL — ABNORMAL HIGH (ref 70–99)
Glucose-Capillary: 187 mg/dL — ABNORMAL HIGH (ref 70–99)

## 2018-10-24 LAB — C-REACTIVE PROTEIN: CRP: 12.2 mg/dL — ABNORMAL HIGH (ref <1.0)

## 2018-10-24 LAB — D-DIMER, QUANTITATIVE: D-Dimer, Quant: 1.49 ug/mL-FEU — ABNORMAL HIGH (ref 0.00–0.50)

## 2018-10-24 MED ORDER — INSULIN ASPART 100 UNIT/ML ~~LOC~~ SOLN
0.0000 [IU] | Freq: Three times a day (TID) | SUBCUTANEOUS | Status: DC
  Administered 2018-10-24: 4 [IU] via SUBCUTANEOUS
  Administered 2018-10-24: 17:00:00 3 [IU] via SUBCUTANEOUS
  Administered 2018-10-24: 7 [IU] via SUBCUTANEOUS
  Administered 2018-10-25 (×3): 4 [IU] via SUBCUTANEOUS
  Administered 2018-10-26: 7 [IU] via SUBCUTANEOUS
  Administered 2018-10-26: 4 [IU] via SUBCUTANEOUS
  Administered 2018-10-27: 7 [IU] via SUBCUTANEOUS
  Administered 2018-10-27: 3 [IU] via SUBCUTANEOUS
  Administered 2018-10-27 – 2018-10-28 (×2): 11 [IU] via SUBCUTANEOUS
  Administered 2018-10-28: 1 [IU] via SUBCUTANEOUS

## 2018-10-24 MED ORDER — INSULIN GLARGINE 100 UNIT/ML ~~LOC~~ SOLN
5.0000 [IU] | Freq: Every day | SUBCUTANEOUS | Status: DC
  Administered 2018-10-24 – 2018-10-27 (×4): 5 [IU] via SUBCUTANEOUS
  Filled 2018-10-24 (×5): qty 0.05

## 2018-10-24 MED ORDER — INSULIN ASPART 100 UNIT/ML ~~LOC~~ SOLN
0.0000 [IU] | Freq: Every day | SUBCUTANEOUS | Status: DC
  Administered 2018-10-27: 2 [IU] via SUBCUTANEOUS

## 2018-10-24 NOTE — Progress Notes (Signed)
PROGRESS NOTE  [name].  HYI:[id number] DOB: 07/23/1945 DOA: 10/22/2018 PCP: [name], DO  Brief Narrative: [name]. is a 73 y.o. male with a history of T2DM, HTN, HLD and covid-19 diagnosis 9/29 who presented to [facility] ED 10/1 for progressive fatigue and poor per oral intake with nausea in the setting of diffuse weakness and nonproductive cough starting about 1 week prior. In the ED he was found to be febrile with hypoxia with diffuse opacities on CXR with LLL predominance. Decadron was given and the patient was admitted to [facility] after SARS-CoV-2 PCR was confirmed to be positive. Remdesivir was started on arrival and antibiotics have been added on 10/2.  Assessment & Plan: Principal Problem:   Pneumonia due to COVID-19 virus Active Problems:   Essential hypertension   Hyperlipidemia associated with type 2 diabetes mellitus ([place])   Controlled type 2 diabetes mellitus with retinopathy ([place])   Acute respiratory failure due to COVID-19 ([place])   Hyponatremia   Acute hypoxic respiratory failure due to covid-19 pneumonia, possible superimposed CAP: Bilateral CXR infiltrates and lymphopenia without neutrophilia, in addition to other findings, is more consistent with covid infection alone. Predominance of LLL opacity, mild leftward shift, and elevated proclacitonin (0.97) argue for bacterial component as well.  - Treat as covid and CAP with remdesivir (10/1 - 10/5), steroids (10/1 - 10/10) and CTX/azithromycin (10/2 - 10/6).  - Wean oxygen as tolerated, suspect a significant exertional component to hypoxia based on history of minimal shortness of breath at rest. Check ambulatory pulse oximetry. - Given tobacco use history and +FH of CA, would recommend repeating imaging after convalescence to confirm resolution of infiltrate.  - With severe CRP elevation have low threshold to give CCP under EUA if hypoxia progresses. Fortunately clinical trajectory is positive thus far. -  Continue airborne, contact precautions. PPE including surgical gown, gloves, cap, shoe covers, and CAPR used during this encounter in a negative pressure room.  - Check daily labs: CBC w/diff, CMP, d-dimer, ferritin, CRP. Showing some improvement sustained, sill grossly elevated. - Blood cultures drawn on admission, NGTD.  - Maintain euvolemia/net negative.  - Avoid NSAIDs - Recommend proning and aggressive use of incentive spirometry.  T2DM: HbA1c 6.3%. - Holding glipizide, metformin, trulicity and instead giving moderate SSI while admitted. Starting lantus and increasing SSI intensity.   HTN:  - Holding ACEi and thiazide for now. Currently normotensive.  Hyperlipidemia:  - Continue statin. LFTs wnl and will be monitored while on remdesivir.   Hyponatremia: With poor per oral intake and concomitant diuretic use, suspect hypovolemic etiology which is improving.  - Continue to monitor. Holding diuretic as above. No indication for further work up at this time as this is improving.  DVT prophylaxis: Lovenox 40mg  q24h (~0.5mg /kg) Code Status: Full Family Communication: None at bedside Disposition Plan: Uncertain. Will get PT/OT evaluations. Likely to DC 10/6.   Consultants:   None  Procedures:   None  Antimicrobials: Remdesivir 10/1 - 10/5 Ceftriaxone, azithromycin 10/2 >>   Subjective: No chest pain, syncope, palpitations. Shortness of breath is minimal, improved.   Objective: Vitals:   10/23/18 2106 10/24/18 0000 10/24/18 0525 10/24/18 0738  BP:   110/62 117/68  Pulse: 62 64 (!) 49 (!) 53  Resp:   18 15  Temp:   97.6 F (36.4 C) 98.2 F (36.8 C)  TempSrc:   Oral Oral  SpO2: 100%  96% 91%  Weight:      Height:  Intake/Output Summary (Last 24 hours) at 10/24/2018 1116 Last data filed at 10/24/2018 0919 Gross per 24 hour  Intake 627.32 ml  Output 700 ml  Net -72.68 ml   Filed Weights   10/22/18 2342  Weight: 83.8 kg   Gen: 73 y.o. male in no distress  Pulm: Nonlabored breathing. L>R diminished. CV: Regular borderline bradycardia. No murmur, rub, or gallop. No JVD, no dependent edema. GI: Abdomen soft, non-tender, non-distended, with normoactive bowel sounds.  Ext: Warm, no deformities Skin: No rashes, lesions or ulcers on visualized skin. Neuro: Alert and oriented. No focal neurological deficits. Psych: Judgement and insight appear fair. Mood euthymic & affect congruent. Behavior is appropriate.    Data Reviewed: I have personally reviewed following labs and imaging studies  CBC: Recent Labs  Lab 10/22/18 1224 10/23/18 0256 10/24/18 0347  WBC 7.5 5.1 7.0  NEUTROABS 6.8 4.6 6.1  HGB 13.7 13.1 13.6  HCT 39.4 39.3 40.9  MCV 87.2 90.8 90.9  PLT 235 269 315   Basic Metabolic Panel: Recent Labs  Lab 10/22/18 1224 10/23/18 0256 10/24/18 0347  NA 128* 132* 134*  K 3.8 4.0 4.5  CL 92* 96* 99  CO2 GLUCOSE 224* 285* 200*  BUN 32* 30* 34*  CREATININE 1.20 0.94 0.95  CALCIUM 8.3* 8.2* 8.4*   GFR: Estimated Creatinine Clearance: 74.9 mL/min (by C-G formula based on SCr of 0.95 mg/dL). Liver Function Tests: Recent Labs  Lab 10/22/18 1224 10/23/18 0256 10/24/18 0347  AST 37 28 27  ALT ALKPHOS 52 52 50  BILITOT 1.2 0.7 0.4  PROT 7.6 6.9 6.6  ALBUMIN 3.1* 2.7* 2.7*   No results for input(s): LIPASE, AMYLASE in the last 168 hours. No results for input(s): AMMONIA in the last 168 hours. Coagulation Profile: No results for input(s): INR, PROTIME in the last 168 hours. Cardiac Enzymes: No results for input(s): CKTOTAL, CKMB, CKMBINDEX, TROPONINI in the last 168 hours. BNP (last 3 results) No results for input(s): PROBNP in the last 8760 hours. HbA1C: Recent Labs    10/23/18 0256  HGBA1C 6.3*   CBG: Recent Labs  Lab 10/23/18 0003 10/23/18 0740 10/23/18 1136 10/23/18 1723 10/23/18 2122  GLUCAP 242* 246* 245* 223* 227*   Lipid Profile: Recent Labs    10/22/18 1224  TRIG 98   Thyroid  Function Tests: Recent Labs    10/23/18 0256  TSH 0.621  FREET4 1.18*   Anemia Panel: Recent Labs    10/23/18 0256 10/24/18 0347  FERRITIN 774* 714*   Urine analysis: No results found for: COLORURINE, APPEARANCEUR, LABSPEC, PHURINE, GLUCOSEU, HGBUR, BILIRUBINUR, KETONESUR, PROTEINUR, UROBILINOGEN, NITRITE, LEUKOCYTESUR Recent Results (from the past 240 hour(s))  SARS Coronavirus 2 [facility] order, Performed in [facility] hospital lab) Nasopharyngeal Nasopharyngeal Swab     Status: Abnormal   Collection Time: 10/22/18 12:39 PM   Specimen: Nasopharyngeal Swab  Result Value Ref Range Status   SARS Coronavirus 2 POSITIVE (A) NEGATIVE Final    Comment: RESULT CALLED TO, READ BACK BY AND VERIFIED WITH: ANNIE SMITH AT 1358 ON 10/22/2018 [facility]. (NOTE) If result is NEGATIVE SARS-CoV-2 target nucleic acids are NOT DETECTED. The SARS-CoV-2 RNA is generally detectable in upper and lower  respiratory specimens during the acute phase of infection. The lowest  concentration of SARS-CoV-2 viral copies this assay can detect is 250  copies / mL. A negative result does not preclude SARS-CoV-2 infection  and should not be used as the sole basis for  treatment or other  patient management decisions.  A negative result may occur with  improper specimen collection / handling, submission of specimen other  than nasopharyngeal swab, presence of viral mutation(s) within the  areas targeted by this assay, and inadequate number of viral copies  (<250 copies / mL). A negative result must be combined with clinical  observations, patient history, and epidemiological information. If result is POSITIVE SARS-CoV-2 target nucleic acids are DETECTED.  The SARS-CoV-2 RNA is generally detectable in upper and lower  respiratory specimens during the acute phase of infection.  Positive  results are indicative of active infection with SARS-CoV-2.  Clinical  correlation with patient history and other diagnostic  information is  necessary to determine patient infection status.  Positive results do  not rule out bacterial infection or co-infection with other viruses. If result is PRESUMPTIVE POSTIVE SARS-CoV-2 nucleic acids MAY BE PRESENT.   A presumptive positive result was obtained on the submitted specimen  and confirmed on repeat testing.  While 2019 novel coronavirus  (SARS-CoV-2) nucleic acids may be present in the submitted sample  additional confirmatory testing may be necessary for epidemiological  and / or clinical management purposes  to differentiate between  SARS-CoV-2 and other Sarbecovirus currently known to infect humans.  If clinically indicated additional testing with an alternate test  methodology [phone])  is advised. The SARS-CoV-2 RNA is generally  detectable in upper and lower respiratory specimens during the acute  phase of infection. The expected result is Negative. Fact Sheet for Patients:  BoilerBrush.com.cy Fact Sheet for Healthcare Providers: https://pope.com/ This test is not yet approved or cleared by the [place] FDA and has been authorized for detection and/or diagnosis of SARS-CoV-2 by FDA under an Emergency Use Authorization (EUA).  This EUA will remain in effect (meaning this test can be used) for the duration of the COVID-19 declaration under Section 564(b)(1) of the Act, 21 U.S.C. section 360bbb-3(b)(1), unless the authorization is terminated or revoked sooner. Performed at [facility], [address] [place], [place] [address]   Blood Culture (routine x 2)     Status: None (Preliminary result)   Collection Time: 10/22/18 12:39 PM   Specimen: BLOOD  Result Value Ref Range Status   Specimen Description BLOOD BLOOD LEFT FOREARM  Final   Special Requests   Final    BOTTLES DRAWN AEROBIC AND ANAEROBIC Blood Culture adequate volume   Culture   Final    NO GROWTH 2 DAYS Performed at [facility], [address] [place], [place] [address]    Report Status PENDING  Incomplete  Blood Culture (routine x 2)     Status: None (Preliminary result)   Collection Time: 10/22/18 12:39 PM   Specimen: BLOOD  Result Value Ref Range Status   Specimen Description BLOOD LEFT ANTECUBITAL  Final   Special Requests   Final    BOTTLES DRAWN AEROBIC AND ANAEROBIC Blood Culture adequate volume   Culture   Final    NO GROWTH 2 DAYS Performed at [facility], [address] [place], [place] [address]    Report Status PENDING  Incomplete      Radiology Studies: Portable Chest 1 View  Result Date: 10/23/2018 CLINICAL DATA:  Shortness of breath EXAM: PORTABLE CHEST 1 VIEW COMPARISON:  10/22/2018 FINDINGS: Cardiac shadow is stable but accentuated by the portable technique. The right lung is clear. Patchy parenchymal density is noted throughout the mid left lung similar to that seen on the prior  exam consistent with the given clinical history. No bony abnormality is noted. IMPRESSION: Stable left-sided infiltrate with improved aeration in the right lung. Electronically Signed   By: Alcide CleverMark  Lukens M.D.   On: 10/23/2018 08:27   Dg Chest Port 1 View  Result Date: 10/22/2018 CLINICAL DATA:  Weakness, hypoxia, COVID-19 positive EXAM: PORTABLE CHEST 1 VIEW COMPARISON:  None. FINDINGS: The heart size and mediastinal contours are within normal limits. There is subtle bilateral heterogeneous pulmonary opacity, most conspicuous in the left lung base. The visualized skeletal structures are unremarkable. IMPRESSION: There is subtle bilateral heterogeneous pulmonary opacity, most conspicuous in the left lung base, consistent with infection and generally in keeping with reported COVID-19. Electronically Signed   By: Lauralyn PrimesAlex  Bibbey M.D.   On: 10/22/2018 12:47    Scheduled Meds: . dexamethasone  6 mg Oral Q24H  . docusate sodium  100 mg Oral BID  . enoxaparin (LOVENOX) injection  40 mg Subcutaneous QHS   . insulin aspart  0-20 Units Subcutaneous TID WC  . insulin aspart  0-5 Units Subcutaneous QHS  . insulin glargine  5 Units Subcutaneous QHS  . pravastatin  10 mg Oral q1800  . sodium chloride flush  3 mL Intravenous Q12H   Continuous Infusions: . sodium chloride    . azithromycin 500 mg (10/24/18 0954)  . cefTRIAXone (ROCEPHIN)  IV 2 g (10/24/18 0912)  . remdesivir 100 mg in NS 250 mL 100 mg (10/23/18 1723)     LOS: 2 days   Time spent: 35 minutes.  Tyrone Nineyan B Oluchi Pucci, MD [facility] [email or web address] Password [email or web address] 10/24/2018, 11:16 AM

## 2018-10-24 NOTE — Plan of Care (Signed)
  Problem: Pain Managment: Goal: General experience of comfort will improve Outcome: Progressing   

## 2018-10-24 NOTE — Progress Notes (Signed)
SATURATION QUALIFICATIONS: (This note is used to comply with regulatory documentation for home oxygen)  Patient Saturations on Room Air at Rest = 93%  Patient Saturations on Room Air while Ambulating = 92%  Pt tolerated ambulation trial on RA without issue. Ambulated 300 ft in hallway on RA while maintaining baseline SaO2.

## 2018-10-24 NOTE — Progress Notes (Signed)
Pt's sister [name] called. All nursing questions answered. She expressed concerns about discharge plans and health insurance coverage. Case managemnt consult placed.

## 2018-10-25 LAB — CBC WITH DIFFERENTIAL/PLATELET
Abs Immature Granulocytes: 0.02 10*3/uL (ref 0.00–0.07)
Basophils Absolute: 0 10*3/uL (ref 0.0–0.1)
Basophils Relative: 0 %
Eosinophils Absolute: 0 10*3/uL (ref 0.0–0.5)
Eosinophils Relative: 0 %
HCT: 40.8 % (ref 39.0–52.0)
Hemoglobin: 13.6 g/dL (ref 13.0–17.0)
Immature Granulocytes: 0 %
Lymphocytes Relative: 13 %
Lymphs Abs: 0.7 10*3/uL (ref 0.7–4.0)
MCH: 30.2 pg (ref 26.0–34.0)
MCHC: 33.3 g/dL (ref 30.0–36.0)
MCV: 90.7 fL (ref 80.0–100.0)
Monocytes Absolute: 0.4 10*3/uL (ref 0.1–1.0)
Monocytes Relative: 7 %
Neutro Abs: 4.6 10*3/uL (ref 1.7–7.7)
Neutrophils Relative %: 80 %
Platelets: 300 10*3/uL (ref 150–400)
RBC: 4.5 MIL/uL (ref 4.22–5.81)
RDW: 11.4 % — ABNORMAL LOW (ref 11.5–15.5)
WBC: 5.7 10*3/uL (ref 4.0–10.5)
nRBC: 0 % (ref 0.0–0.2)

## 2018-10-25 LAB — C-REACTIVE PROTEIN: CRP: 7.5 mg/dL — ABNORMAL HIGH (ref <1.0)

## 2018-10-25 LAB — COMPREHENSIVE METABOLIC PANEL
ALT: 24 U/L (ref 0–44)
AST: 29 U/L (ref 15–41)
Albumin: 2.7 g/dL — ABNORMAL LOW (ref 3.5–5.0)
Alkaline Phosphatase: 51 U/L (ref 38–126)
Anion gap: 9 (ref 5–15)
BUN: 25 mg/dL — ABNORMAL HIGH (ref 8–23)
CO2: 26 mmol/L (ref 22–32)
Calcium: 8.5 mg/dL — ABNORMAL LOW (ref 8.9–10.3)
Chloride: 103 mmol/L (ref 98–111)
Creatinine, Ser: 0.89 mg/dL (ref 0.61–1.24)
GFR calc Af Amer: >60 mL/min (ref >60)
GFR calc non Af Amer: >60 mL/min (ref >60)
Glucose, Bld: 142 mg/dL — ABNORMAL HIGH (ref 70–99)
Potassium: 4.8 mmol/L (ref 3.5–5.1)
Sodium: 138 mmol/L (ref 135–145)
Total Bilirubin: 0.5 mg/dL (ref 0.3–1.2)
Total Protein: 6.5 g/dL (ref 6.5–8.1)

## 2018-10-25 LAB — GLUCOSE, CAPILLARY
Glucose-Capillary: 166 mg/dL — ABNORMAL HIGH (ref 70–99)
Glucose-Capillary: 197 mg/dL — ABNORMAL HIGH (ref 70–99)
Glucose-Capillary: 192 mg/dL — ABNORMAL HIGH (ref 70–99)
Glucose-Capillary: 175 mg/dL — ABNORMAL HIGH (ref 70–99)

## 2018-10-25 LAB — FERRITIN: Ferritin: 602 ng/mL — ABNORMAL HIGH (ref 24–336)

## 2018-10-25 LAB — D-DIMER, QUANTITATIVE: D-Dimer, Quant: 1.22 ug/mL-FEU — ABNORMAL HIGH (ref 0.00–0.50)

## 2018-10-25 NOTE — Plan of Care (Signed)

## 2018-10-25 NOTE — Progress Notes (Addendum)
PROGRESS NOTE  [name].  VWU:[id number] DOB: 1945/09/09 DOA: 10/22/2018 PCP: [name], DO  Brief Narrative: [name]. is a 73 y.o. male with a history of T2DM, HTN, HLD and covid-19 diagnosis 9/29 who presented to [facility] ED 10/1 for progressive fatigue and poor per oral intake with nausea in the setting of diffuse weakness and nonproductive cough starting about 1 week prior. In the ED he was found to be febrile with hypoxia with diffuse opacities on CXR with LLL predominance. Decadron was given and the patient was admitted to [facility] after SARS-CoV-2 PCR was confirmed to be positive. Remdesivir was started on arrival and antibiotics have been added on 10/2.  Assessment & Plan: Principal Problem:   Pneumonia due to COVID-19 virus Active Problems:   Essential hypertension   Hyperlipidemia associated with type 2 diabetes mellitus ([facility])   Controlled type 2 diabetes mellitus with retinopathy ([facility])   Acute respiratory failure due to COVID-19 ([facility])   Hyponatremia   Acute hypoxic respiratory failure due to covid-19 pneumonia, possible superimposed CAP: Bilateral CXR infiltrates and lymphopenia without neutrophilia, in addition to other findings, is more consistent with covid infection alone. Predominance of LLL opacity, mild leftward shift, and elevated proclacitonin (0.97) argue for bacterial component as well.  - Treat as covid and CAP with remdesivir (10/1 - 10/5), steroids (10/1 - 10/10) and CTX/azithromycin (10/2 - 10/6). Can transition to po if needed. - Weaned oxygen, remained 92% on room air on ambulation 10/3. - Given tobacco use history and +FH of CA, would recommend repeating imaging after convalescence to confirm resolution of infiltrate.  - With severe CRP elevation have low threshold to give CCP under EUA if hypoxia progresses. Fortunately clinical trajectory is positive thus far. CRP, ferritin, d-dimer sustaining improvement.  - Continue airborne, contact  precautions. PPE including surgical gown, gloves, cap, shoe covers, and CAPR used during this encounter in a negative pressure room.  - Check daily labs: CBC w/diff, CMP, d-dimer, ferritin, CRP.  - Blood cultures drawn on admission, NGTD.  - Maintain euvolemia/net negative.  - Avoid NSAIDs - Recommend proning and aggressive use of incentive spirometry.  T2DM: HbA1c 6.3%. - Holding glipizide, metformin, trulicity and instead giving moderate SSI while admitted. Started lantus and increased SSI intensity with improvement, at inpatient goal.  HTN:  - Holding ACEi and thiazide for now. Currently normotensive.  Hyperlipidemia:  - Continue statin. LFTs wnl and will be monitored while on remdesivir.   Hyponatremia: With poor per oral intake and concomitant diuretic use, suspect hypovolemic etiology. Has resolved.  DVT prophylaxis: Lovenox  q24h (~0.5mg /kg) Code Status: Full Family Communication: None at bedside. Called both sisters this morning without an answer or ability to leave voicemail on either number.  Disposition Plan: Uncertain. Will get PT/OT evaluations. Likely to DC 10/5 PM after final dose of remdesivir since he's weaned to room air..   Consultants:   None  Procedures:   None  Antimicrobials: Remdesivir 10/1 - 10/5 Ceftriaxone, azithromycin 10/2 >>   Subjective: Without complaints other than intermittent nausea. Poor per oral intake. No dyspnea. Ambulated without oxygen yesterday remaining 92%.   Objective: Vitals:   10/24/18 2300 10/25/18 0400 10/25/18 0441 10/25/18 0724  BP:   123/82 (!) 105/54  Pulse:   60 75  Resp:    15  Temp:   98.3 F (36.8 C) 99.1 F (37.3 C)  TempSrc:   Oral Oral  SpO2: 92% 96% 96% 93%  Weight:  Height:        Intake/Output Summary (Last 24 hours) at 10/25/2018 1020 Last data filed at 10/25/2018 0400 Gross per 24 hour  Intake 840 ml  Output -  Net 840 ml   Filed Weights   10/22/18 2342  Weight: 83.8 kg   Gen: 73  y.o. male in no distress Pulm: Nonlabored breathing room air. Clearing bilaterally.. CV: Regular rate and rhythm. No murmur, rub, or gallop. No JVD, no dependent edema. GI: Abdomen soft, non-tender, non-distended, with normoactive bowel sounds.  Ext: Warm, no deformities Skin: No rashes, lesions or ulcers on visualized skin. Neuro: Alert and oriented. No focal neurological deficits. Psych: Judgement and insight appear fair. Mood euthymic & affect congruent. Behavior is appropriate.    Data Reviewed: I have personally reviewed following labs and imaging studies  CBC: Recent Labs  Lab 10/22/18 1224 10/23/18 0256 10/24/18 0347 10/25/18 0524  WBC 7.5 5.1 7.0 5.7  NEUTROABS 6.8 4.6 6.1 4.6  HGB 13.7 13.1 13.6 13.6  HCT 39.4 39.3 40.9 40.8  MCV 87.2 90.8 90.9 90.7  PLT 235 269 315 300   Basic Metabolic Panel: Recent Labs  Lab 10/22/18 1224 10/23/18 0256 10/24/18 0347 10/25/18 0524  NA 128* 132* 134* 138  K 3.8 4.0 4.5 4.8  CL 92* 96* 99 103  CO2 22 24 25 26   GLUCOSE 224* 285* 200* 142*  BUN 32* 30* 34* 25*  CREATININE 1.20 0.94 0.95 0.89  CALCIUM 8.3* 8.2* 8.4* 8.5*   Liver Function Tests: Recent Labs  Lab 10/22/18 1224 10/23/18 0256 10/24/18 0347 10/25/18 0524  AST 37 28 27 29   ALT 22 21 24 24   ALKPHOS 52 52 50 51  BILITOT 1.2 0.7 0.4 0.5  PROT 7.6 6.9 6.6 6.5  ALBUMIN 3.1* 2.7* 2.7* 2.7*   HbA1C: Recent Labs    10/23/18 0256  HGBA1C 6.3*   CBG: Recent Labs  Lab 10/23/18 1723 10/23/18 2122 10/24/18 1629 10/24/18 2138 10/25/18 0801  GLUCAP 223* 227* 140* 187* 166*   Lipid Profile: Recent Labs    10/22/18 1224  TRIG 98   Thyroid Function Tests: Recent Labs    10/23/18 0256  TSH 0.621  FREET4 1.18*   Anemia Panel: Recent Labs    10/24/18 0347 10/25/18 0524  FERRITIN 714* 602*   Urine analysis: No results found for: COLORURINE, APPEARANCEUR, LABSPEC, PHURINE, GLUCOSEU, HGBUR, BILIRUBINUR, KETONESUR, PROTEINUR, UROBILINOGEN, NITRITE,  LEUKOCYTESUR Recent Results (from the past 240 hour(s))  SARS Coronavirus 2 [facility] order, Performed in [facility] hospital lab) Nasopharyngeal Nasopharyngeal Swab     Status: Abnormal   Collection Time: 10/22/18 12:39 PM   Specimen: Nasopharyngeal Swab  Result Value Ref Range Status   SARS Coronavirus 2 POSITIVE (A) NEGATIVE Final    Comment: RESULT CALLED TO, READ BACK BY AND VERIFIED WITH: ANNIE SMITH AT 1358 ON 10/22/2018 [facility]. (NOTE) If result is NEGATIVE SARS-CoV-2 target nucleic acids are NOT DETECTED. The SARS-CoV-2 RNA is generally detectable in upper and lower  respiratory specimens during the acute phase of infection. The lowest  concentration of SARS-CoV-2 viral copies this assay can detect is 250  copies / mL. A negative result does not preclude SARS-CoV-2 infection  and should not be used as the sole basis for treatment or other  patient management decisions.  A negative result may occur with  improper specimen collection / handling, submission of specimen other  than nasopharyngeal swab, presence of viral mutation(s) within the  areas targeted by this assay, and inadequate  number of viral copies  (<250 copies / mL). A negative result must be combined with clinical  observations, patient history, and epidemiological information. If result is POSITIVE SARS-CoV-2 target nucleic acids are DETECTED.  The SARS-CoV-2 RNA is generally detectable in upper and lower  respiratory specimens during the acute phase of infection.  Positive  results are indicative of active infection with SARS-CoV-2.  Clinical  correlation with patient history and other diagnostic information is  necessary to determine patient infection status.  Positive results do  not rule out bacterial infection or co-infection with other viruses. If result is PRESUMPTIVE POSTIVE SARS-CoV-2 nucleic acids MAY BE PRESENT.   A presumptive positive result was obtained on the submitted specimen  and confirmed on  repeat testing.  While 2019 novel coronavirus  (SARS-CoV-2) nucleic acids may be present in the submitted sample  additional confirmatory testing may be necessary for epidemiological  and / or clinical management purposes  to differentiate between  SARS-CoV-2 and other Sarbecovirus currently known to infect humans.  If clinically indicated additional testing with an alternate test  methodology [phone])  is advised. The SARS-CoV-2 RNA is generally  detectable in upper and lower respiratory specimens during the acute  phase of infection. The expected result is Negative. Fact Sheet for Patients:  StrictlyIdeas.no Fact Sheet for Healthcare Providers: BankingDealers.co.za This test is not yet approved or cleared by the [place] FDA and has been authorized for detection and/or diagnosis of SARS-CoV-2 by FDA under an Emergency Use Authorization (EUA).  This EUA will remain in effect (meaning this test can be used) for the duration of the COVID-19 declaration under Section 564(b)(1) of the Act, 21 U.S.C. section 360bbb-3(b)(1), unless the authorization is terminated or revoked sooner. Performed at [facility], [address] [place], [place] [address]   Blood Culture (routine x 2)     Status: None (Preliminary result)   Collection Time: 10/22/18 12:39 PM   Specimen: BLOOD  Result Value Ref Range Status   Specimen Description BLOOD BLOOD LEFT FOREARM  Final   Special Requests   Final    BOTTLES DRAWN AEROBIC AND ANAEROBIC Blood Culture adequate volume   Culture   Final    NO GROWTH 3 DAYS Performed at [facility], [address] [place], [place] [address]    Report Status PENDING  Incomplete  Blood Culture (routine x 2)     Status: None (Preliminary result)   Collection Time: 10/22/18 12:39 PM   Specimen: BLOOD  Result Value Ref Range Status   Specimen Description BLOOD LEFT ANTECUBITAL  Final   Special  Requests   Final    BOTTLES DRAWN AEROBIC AND ANAEROBIC Blood Culture adequate volume   Culture   Final    NO GROWTH 3 DAYS Performed at [facility], [address], [place], [place] [address]    Report Status PENDING  Incomplete      Radiology Studies: No results found.  Scheduled Meds: . dexamethasone  6 mg Oral Q24H  . docusate sodium  100 mg Oral BID  . enoxaparin (LOVENOX) injection  40 mg Subcutaneous QHS  . insulin aspart  0-20 Units Subcutaneous TID WC  . insulin aspart  0-5 Units Subcutaneous QHS  . insulin glargine  5 Units Subcutaneous QHS  . pravastatin  10 mg Oral q1800  . sodium chloride flush  3 mL Intravenous Q12H   Continuous Infusions: . sodium chloride    . azithromycin 500 mg (10/24/18 0954)  . cefTRIAXone (  ROCEPHIN)  IV 2 g (10/24/18 0912)  . remdesivir 100 mg in NS 250 mL 100 mg (10/24/18 1653)     LOS: 3 days   Time spent: 25 minutes.  Tyrone Nineyan B Maris Bena, MD [facility] [email or web address] Password [email or web address] 10/25/2018, 10:20 AM

## 2018-10-25 NOTE — TOC Initial Note (Addendum)
Transition of Care [facility]) - Initial/Assessment Note  Donn Pierini RN,BSN Transitions of Care [facility] remote coverage - RN Case Manager [phone]   Patient Details  Name: [name]. MRN: [id number] Date of Birth: 1945/05/05  Transition of Care [facility]) CM/SW Contact:    Darrold Span, RN Phone Number: 10/25/2018, 11:26 AM  Clinical Narrative:                 Pt from home alone, has sisters and brother to assist on discharge- referral received that sister [name] wanted to speak with CM- attempted call however phone # listed not in service- call made to [name]- other sister listed- [phone]- who states she was actually the one that called to make request- this sister is the main contact for discussing transition needs and will communicate with the rest of family. Discussed with [name] transition potential needs and what insurance covers and what it does not. Provided info for possible private duty needs that family would need to arrange if that is their preference along with some contacts per sister's request. Also discussed guidance for cleaning the home and caregivers precautions. PT eval has been ordered and will f/u for recommendations. Pt is currently on RA and most likely will not need home 02. CM will follow for transition of care needs. Sister states that they will assist pt on discharge however not sure they can provide 24/7 care themselves but will look into private duty if needed. Pt does not have any DME at home.  Expected Discharge Plan: Home w Home Health Services Barriers to Discharge: Continued Medical Work up   Patient Goals and CMS Choice Patient states their goals for this hospitalization and ongoing recovery are:: to get patient home safely(sister) CMS Medicare.gov Compare Post Acute Care list provided to:: Patient Represenative (must comment)(sister-[name]) Choice offered to / list presented to : Sibling  Expected Discharge Plan and Services Expected  Discharge Plan: Home w Home Health Services   Discharge Planning Services: CM Consult   Living arrangements for the past 2 months: Single Family Home                                      Prior Living Arrangements/Services Living arrangements for the past 2 months: Single Family Home Lives with:: Self Patient language and need for interpreter reviewed:: Yes Do you feel safe going back to the place where you live?: Yes      Need for Family Participation in Patient Care: Yes (Comment) Care giver support system in place?: Yes (comment)   Criminal Activity/Legal Involvement Pertinent to Current Situation/Hospitalization: No - Comment as needed  Activities of Daily Living Home Assistive Devices/Equipment: None ADL Screening (condition at time of admission) Patient's cognitive ability adequate to safely complete daily activities?: Yes Is the patient deaf or have difficulty hearing?: No Does the patient have difficulty seeing, even when wearing glasses/contacts?: No Does the patient have difficulty concentrating, remembering, or making decisions?: No Patient able to express need for assistance with ADLs?: Yes Does the patient have difficulty dressing or bathing?: No Independently performs ADLs?: Yes (appropriate for developmental age) Does the patient have difficulty walking or climbing stairs?: No Weakness of Legs: None Weakness of Arms/Hands: None  Permission Sought/Granted Permission sought to share information with : Facility Medical sales representative Permission granted to share information with : Yes, Verbal Permission Granted  Share Information with NAME: [name]  Permission granted to share info w Relationship: sister  Permission granted to share info w Contact Information: [phone]  Emotional Assessment           Psych Involvement: No (comment)  Admission diagnosis:  COVID 19 VIRUS INFECTION Patient Active Problem List   Diagnosis Date Noted  .  Pneumonia due to COVID-19 virus 10/22/2018  . Acute respiratory failure due to COVID-19 ([place]) 10/22/2018  . Hyponatremia 10/22/2018  . External hemorrhoid 10/14/2017  . Diabetic retinopathy ([place]) 08/02/2016  . Acne vulgaris 12/06/2015  . Controlled type 2 diabetes mellitus with retinopathy ([place]) 07/19/2014  . ED (erectile dysfunction) of organic origin 11/28/2011  . Essential hypertension 11/28/2011  . Hyperlipidemia associated with type 2 diabetes mellitus ([place]) 11/28/2011   PCP:  [name], DO Pharmacy:   [facility] [phone] [address], [address] [place] [address] Phone: [phone] Fax: [place], [address] [place] [address] Phone: [phone] Fax: [phone]     Social Determinants of Health (SDOH) Interventions    Readmission Risk Interventions No flowsheet data found.

## 2018-10-25 NOTE — Plan of Care (Signed)
?  Problem: Education: ?Goal: Knowledge of General Education information will improve ?Description: Including pain rating scale, medication(s)/side effects and non-pharmacologic comfort measures ?Outcome: Progressing ?  ?Problem: Health Behavior/Discharge Planning: ?Goal: Ability to manage health-related needs will improve ?Outcome: Progressing ?  ?Problem: Clinical Measurements: ?Goal: Ability to maintain clinical measurements within normal limits will improve ?Outcome: Progressing ?Goal: Will remain free from infection ?Outcome: Progressing ?Goal: Diagnostic test results will improve ?Outcome: Progressing ?Goal: Respiratory complications will improve ?Outcome: Progressing ?Goal: Cardiovascular complication will be avoided ?Outcome: Progressing ?  ?Problem: Coping: ?Goal: Level of anxiety will decrease ?Outcome: Progressing ?  ?Problem: Elimination: ?Goal: Will not experience complications related to bowel motility ?Outcome: Progressing ?Goal: Will not experience complications related to urinary retention ?Outcome: Progressing ?  ?Problem: Pain Managment: ?Goal: General experience of comfort will improve ?Outcome: Progressing ?  ?

## 2018-10-26 ENCOUNTER — Ambulatory Visit: Payer: Self-pay | Admitting: *Deleted

## 2018-10-26 ENCOUNTER — Telehealth: Payer: Self-pay

## 2018-10-26 DIAGNOSIS — E113293 Type 2 diabetes mellitus with mild nonproliferative diabetic retinopathy without macular edema, bilateral: Secondary | ICD-10-CM

## 2018-10-26 LAB — COMPREHENSIVE METABOLIC PANEL
ALT: 36 U/L (ref 0–44)
AST: 38 U/L (ref 15–41)
Albumin: 2.7 g/dL — ABNORMAL LOW (ref 3.5–5.0)
Alkaline Phosphatase: 52 U/L (ref 38–126)
Anion gap: 10 (ref 5–15)
BUN: 17 mg/dL (ref 8–23)
CO2: 23 mmol/L (ref 22–32)
Calcium: 8.3 mg/dL — ABNORMAL LOW (ref 8.9–10.3)
Chloride: 103 mmol/L (ref 98–111)
Creatinine, Ser: 0.84 mg/dL (ref 0.61–1.24)
GFR calc Af Amer: >60 mL/min (ref >60)
GFR calc non Af Amer: >60 mL/min (ref >60)
Glucose, Bld: 143 mg/dL — ABNORMAL HIGH (ref 70–99)
Potassium: 4.1 mmol/L (ref 3.5–5.1)
Sodium: 136 mmol/L (ref 135–145)
Total Bilirubin: 0.9 mg/dL (ref 0.3–1.2)
Total Protein: 6.4 g/dL — ABNORMAL LOW (ref 6.5–8.1)

## 2018-10-26 LAB — GLUCOSE, CAPILLARY
Glucose-Capillary: 179 mg/dL — ABNORMAL HIGH (ref 70–99)
Glucose-Capillary: 176 mg/dL — ABNORMAL HIGH (ref 70–99)
Glucose-Capillary: 224 mg/dL — ABNORMAL HIGH (ref 70–99)
Glucose-Capillary: 115 mg/dL — ABNORMAL HIGH (ref 70–99)
Glucose-Capillary: 191 mg/dL — ABNORMAL HIGH (ref 70–99)

## 2018-10-26 LAB — C-REACTIVE PROTEIN: CRP: 13.5 mg/dL — ABNORMAL HIGH (ref <1.0)

## 2018-10-26 NOTE — Progress Notes (Signed)
PROGRESS NOTE  [name].  YOV:[id number] DOB: 24-Mar-1945 DOA: 10/22/2018 PCP: [name], DO  Brief Narrative: Christopher Ingwersen. is a 73 y.o. male with a history of T2DM, HTN, HLD and covid-19 diagnosis 9/29 who presented to [facility] ED 10/1 for progressive fatigue and poor per oral intake with nausea in the setting of diffuse weakness and nonproductive cough starting about 1 week prior. In the ED he was found to be febrile with hypoxia with diffuse opacities on CXR with LLL predominance. Decadron was given and the patient was admitted to [facility] after SARS-CoV-2 PCR was confirmed to be positive. Remdesivir was started on arrival and antibiotics have been added on 10/2.  Assessment & Plan: Principal Problem:   Pneumonia due to COVID-19 virus Active Problems:   Essential hypertension   Hyperlipidemia associated with type 2 diabetes mellitus ([place])   Controlled type 2 diabetes mellitus with retinopathy ([place])   Acute respiratory failure due to COVID-19 ([place])   Hyponatremia   Acute hypoxic respiratory failure due to covid-19 pneumonia, possible superimposed CAP: Bilateral CXR infiltrates and lymphopenia without neutrophilia, in addition to other findings, is more consistent with covid infection alone. Predominance of LLL opacity, mild leftward shift, and elevated proclacitonin (0.97) argue for bacterial component as well.  - Treat as covid and CAP with remdesivir (10/1 - 10/5), steroids (10/1 - 10/10) and CTX/azithromycin (10/2 - 10/6). Can transition to po if needed. - Weaned oxygen, remained 92% on room air on ambulation 10/3. - Given tobacco use history and +FH of CA, would recommend repeating imaging after convalescence to confirm resolution of infiltrate.  - CRP, ferritin, d-dimer sustaining improvement until CRP 7.5 > 13.5 on 10/5. ?spurious result in setting of otherwise positive and sustained clinical trajectory. Will complete abx and repeat testing with ferritin, d-dimer, CBC  10/6.  - Continue airborne, contact precautions. PPE including surgical gown, gloves, cap, shoe covers, and CAPR used during this encounter in a negative pressure room.  - Check daily labs: CBC w/diff, CMP, d-dimer, ferritin, CRP.  - Blood cultures drawn on admission, NGTD.  - Maintain euvolemia/net negative.  - Avoid NSAIDs - Recommend proning and aggressive use of incentive spirometry.  T2DM: HbA1c 6.3%. - Holding glipizide, metformin, trulicity and instead giving moderate SSI while admitted. Started lantus and increased SSI intensity with improvement, at inpatient goal. Continue this.  HTN:  - Holding ACEi and thiazide for now. Currently normotensive.  Hyperlipidemia:  - Continue statin. LFTs wnl and will be monitored while on remdesivir.   Hyponatremia: With poor per oral intake and concomitant diuretic use, suspect hypovolemic etiology. Has resolved.  DVT prophylaxis: Lovenox 40mg  q24h (~0.5mg /kg) Code Status: Full Family Communication: None at bedside.  Disposition Plan: Uncertain. Will get PT/OT evaluations. With inflammatory marker nearly doubling in past 24 hours, we will complete the course of IV antibiotics on 10/6 prior to discharging.  Consultants:   None  Procedures:   None  Antimicrobials: Remdesivir 10/1 - 10/5 Ceftriaxone, azithromycin 10/2 - 10/6   Subjective: Some shortness of breath on exertion, but overall feels better. Able to eat/drink without issues. Felt unsteady but not weak. No chest pain or new productive cough or urinary symptoms.  Objective: Vitals:   10/25/18 1956 10/25/18 2300 10/26/18 0807 10/26/18 1103  BP: 131/75  122/73   Pulse: 63  61   Resp: 15  17   Temp: 98.6 F (37 C)  98.5 F (36.9 C)   TempSrc: Oral  Oral   SpO2: 98% 95%  95% (!) 88%  Weight:      Height:        Intake/Output Summary (Last 24 hours) at 10/26/2018 1156 Last data filed at 10/26/2018 0800 Gross per 24 hour  Intake 240 ml  Output 400 ml  Net -160 ml    Filed Weights   10/22/18 2342  Weight: 83.8 kg   Gen: 73 y.o. male in no distress Pulm: Nonlabored breathing room air. Clear. CV: Regular rate and rhythm. No murmur, rub, or gallop. No JVD, no dependent edema. GI: Abdomen soft, non-tender, non-distended, with normoactive bowel sounds.  Ext: Warm, no deformities Skin: No rashes, lesions or ulcers on visualized skin. Neuro: Alert and oriented. No focal neurological deficits. Psych: Judgement and insight appear fair. Mood euthymic & affect congruent. Behavior is appropriate.    Data Reviewed: I have personally reviewed following labs and imaging studies  CBC: Recent Labs  Lab 10/22/18 1224 10/23/18 0256 10/24/18 0347 10/25/18 0524  WBC 7.5 5.1 7.0 5.7  NEUTROABS 6.8 4.6 6.1 4.6  HGB 13.7 13.1 13.6 13.6  HCT 39.4 39.3 40.9 40.8  MCV 87.2 90.8 90.9 90.7  PLT 235 269 315 300   Basic Metabolic Panel: Recent Labs  Lab 10/22/18 1224 10/23/18 0256 10/24/18 0347 10/25/18 0524 10/26/18 0515  NA 128* 132* 134* 138 136  K 3.8 4.0 4.5 4.8 4.1  CL 92* 96* 99 103 103  CO2 GLUCOSE 224* 285* 200* 142* 143*  BUN 32* 30* 34* 25* 17  CREATININE 1.20 0.94 0.95 0.89 0.84  CALCIUM 8.3* 8.2* 8.4* 8.5* 8.3*   Liver Function Tests: Recent Labs  Lab 10/22/18 1224 10/23/18 0256 10/24/18 0347 10/25/18 0524 10/26/18 0515  AST 37 38  ALT 36  ALKPHOS 52 52 50 51 52  BILITOT 1.2 0.7 0.4 0.5 0.9  PROT 7.6 6.9 6.6 6.5 6.4*  ALBUMIN 3.1* 2.7* 2.7* 2.7* 2.7*   HbA1C: No results for input(s): HGBA1C in the last 72 hours. CBG: Recent Labs  Lab 10/25/18 0801 10/25/18 1112 10/25/18 1629 10/25/18 2157 10/26/18 0806  GLUCAP 166* 197* 192* 175* 176*   Lipid Profile: No results for input(s): CHOL, HDL, LDLCALC, TRIG, CHOLHDL, LDLDIRECT in the last 72 hours. Thyroid Function Tests: No results for input(s): TSH, T4TOTAL, FREET4, T3FREE, THYROIDAB in the last 72 hours. Anemia Panel: Recent Labs     10/24/18 0347 10/25/18 0524  FERRITIN 714* 602*   Urine analysis: No results found for: COLORURINE, APPEARANCEUR, LABSPEC, PHURINE, GLUCOSEU, HGBUR, BILIRUBINUR, KETONESUR, PROTEINUR, UROBILINOGEN, NITRITE, LEUKOCYTESUR Recent Results (from the past 240 hour(s))  SARS Coronavirus 2 [facility] order, Performed in [facility] hospital lab) Nasopharyngeal Nasopharyngeal Swab     Status: Abnormal   Collection Time: 10/22/18 12:39 PM   Specimen: Nasopharyngeal Swab  Result Value Ref Range Status   SARS Coronavirus 2 POSITIVE (A) NEGATIVE Final    Comment: RESULT CALLED TO, READ BACK BY AND VERIFIED WITH: ANNIE SMITH AT 1358 ON 10/22/2018 [facility]. (NOTE) If result is NEGATIVE SARS-CoV-2 target nucleic acids are NOT DETECTED. The SARS-CoV-2 RNA is generally detectable in upper and lower  respiratory specimens during the acute phase of infection. The lowest  concentration of SARS-CoV-2 viral copies this assay can detect is 250  copies / mL. A negative result does not preclude SARS-CoV-2 infection  and should not be used as the sole basis for treatment or other  patient management decisions.  A negative result may occur with  improper specimen collection / handling, submission of specimen other  than nasopharyngeal swab, presence of viral mutation(s) within the  areas targeted by this assay, and inadequate number of viral copies  (<250 copies / mL). A negative result must be combined with clinical  observations, patient history, and epidemiological information. If result is POSITIVE SARS-CoV-2 target nucleic acids are DETECTED.  The SARS-CoV-2 RNA is generally detectable in upper and lower  respiratory specimens during the acute phase of infection.  Positive  results are indicative of active infection with SARS-CoV-2.  Clinical  correlation with patient history and other diagnostic information is  necessary to determine patient infection status.  Positive results do  not rule out bacterial  infection or co-infection with other viruses. If result is PRESUMPTIVE POSTIVE SARS-CoV-2 nucleic acids MAY BE PRESENT.   A presumptive positive result was obtained on the submitted specimen  and confirmed on repeat testing.  While 2019 novel coronavirus  (SARS-CoV-2) nucleic acids may be present in the submitted sample  additional confirmatory testing may be necessary for epidemiological  and / or clinical management purposes  to differentiate between  SARS-CoV-2 and other Sarbecovirus currently known to infect humans.  If clinically indicated additional testing with an alternate test  methodology [phone])  is advised. The SARS-CoV-2 RNA is generally  detectable in upper and lower respiratory specimens during the acute  phase of infection. The expected result is Negative. Fact Sheet for Patients:  BoilerBrush.com.cy Fact Sheet for Healthcare Providers: https://pope.com/ This test is not yet approved or cleared by the [place] FDA and has been authorized for detection and/or diagnosis of SARS-CoV-2 by FDA under an Emergency Use Authorization (EUA).  This EUA will remain in effect (meaning this test can be used) for the duration of the COVID-19 declaration under Section 564(b)(1) of the Act, 21 U.S.C. section 360bbb-3(b)(1), unless the authorization is terminated or revoked sooner. Performed at [facility], [address] [place], [place] [address]   Blood Culture (routine x 2)     Status: None (Preliminary result)   Collection Time: 10/22/18 12:39 PM   Specimen: BLOOD  Result Value Ref Range Status   Specimen Description BLOOD BLOOD LEFT FOREARM  Final   Special Requests   Final    BOTTLES DRAWN AEROBIC AND ANAEROBIC Blood Culture adequate volume   Culture   Final    NO GROWTH 4 DAYS Performed at [facility], [address] [place], [place] [address]    Report Status PENDING  Incomplete  Blood Culture  (routine x 2)     Status: None (Preliminary result)   Collection Time: 10/22/18 12:39 PM   Specimen: BLOOD  Result Value Ref Range Status   Specimen Description BLOOD LEFT ANTECUBITAL  Final   Special Requests   Final    BOTTLES DRAWN AEROBIC AND ANAEROBIC Blood Culture adequate volume   Culture   Final    NO GROWTH 4 DAYS Performed at [facility], [address] [place], [place] [address]    Report Status PENDING  Incomplete      Radiology Studies: No results found.  Scheduled Meds: . dexamethasone  6 mg Oral Q24H  . docusate sodium  100 mg Oral BID  . enoxaparin (LOVENOX) injection  40 mg Subcutaneous QHS  . insulin aspart  0-20 Units Subcutaneous TID WC  . insulin aspart  0-5 Units Subcutaneous QHS  . insulin glargine  5 Units Subcutaneous QHS  . pravastatin  10 mg Oral q1800  .  sodium chloride flush  3 mL Intravenous Q12H   Continuous Infusions: . sodium chloride    . azithromycin 500 mg (10/26/18 1023)  . cefTRIAXone (ROCEPHIN)  IV 2 g (10/26/18 0908)  . remdesivir 100 mg in NS 250 mL 100 mg (10/25/18 1700)     LOS: 4 days   Time spent: 25 minutes.  Christopher Nineyan B Cutter Passey, MD [facility] [email or web address] Password [email or web address] 10/26/2018, 11:56 AM

## 2018-10-26 NOTE — Evaluation (Signed)
Physical Therapy Evaluation Patient Details Name: [name]. MRN: [id number] DOB: May 22, 1945 Today's Date: 10/26/2018   History of Present Illness  73 y.o. male admitted on 10/22/18 for progressive fatigue, poor po intake, nausea and non productive cough.  Dx with COVID 19 on 10/20/18.  Pt dx with acute hypoxic resp failure due to COVID 19 PNA, superimposed CAP.  Pt with other significant PMH of DM2, HTN, and rotator cuff repair.    Clinical Impression  Pt was able to walk two laps around the unit with lowest O2 sat on RA 88%, 1/4 DOE, quickly rebounding to the mid 90s most of the walk. He is mildly unsteady, but never needed external assist to recover his balance.  PT will continue to follow acutely for safe mobility progression.  Review HEP, energy conservation handouts next session and then can likely d/c from PT if he does not d/c home first.      Follow Up Recommendations No PT follow up    Equipment Recommendations  None recommended by PT    Recommendations for Other Services   NA    Precautions / Restrictions Precautions Precautions: Other (comment) Precaution Comments: monitor sats      Mobility  Bed Mobility Overal bed mobility: Independent                Transfers Overall transfer level: Independent                  Ambulation/Gait Ambulation/Gait assistance: Supervision Gait Distance (Feet): 600 Feet Assistive device: None Gait Pattern/deviations: Step-through pattern;Staggering right;Staggering left Gait velocity: decreased Gait velocity interpretation: >2.62 ft/sec, indicative of community ambulatory General Gait Details: Pt with very mildly staggering gait pattern, but does not need external assist for keeping his balance, slower gait speed         Balance Overall balance assessment: Needs assistance Sitting-balance support: Feet supported;No upper extremity supported Sitting balance-Leahy Scale: Good     Standing balance support: No  upper extremity supported Standing balance-Leahy Scale: Good               High level balance activites: Backward walking;Direction changes;Turns;Sudden stops High Level Balance Comments: supervision Standardized Balance Assessment Standardized Balance Assessment : Dynamic Gait Index   Dynamic Gait Index Level Surface: Normal Change in Gait Speed: Normal Gait with Horizontal Head Turns: Mild Impairment Gait with Vertical Head Turns: Normal       Pertinent Vitals/Pain Pain Assessment: No/denies pain    Home Living Family/patient expects to be discharged to:: Private residence Living Arrangements: Alone             Home Equipment: None      Prior Function Level of Independence: Independent         Comments: drives a city bus for work (for 26 years)        Extremity/Trunk Assessment   Upper Extremity Assessment Upper Extremity Assessment: Generalized weakness    Lower Extremity Assessment Lower Extremity Assessment: Generalized weakness    Cervical / Trunk Assessment Cervical / Trunk Assessment: Normal  Communication   Communication: No difficulties  Cognition Arousal/Alertness: Awake/alert Behavior During Therapy: Flat affect Overall Cognitive Status: Within Functional Limits for tasks assessed                                 General Comments: a little flat      General Comments General comments (skin integrity, edema, etc.): Lowest O2  sat was 88% on RA during gait and he would quickly rebound to the mid 90s        Assessment/Plan    PT Assessment Patient needs continued PT services  PT Problem List Decreased strength;Decreased activity tolerance;Decreased balance;Decreased knowledge of precautions       PT Treatment Interventions DME instruction;Gait training;Stair training;Functional mobility training;Therapeutic activities;Therapeutic exercise;Balance training;Neuromuscular re-education;Patient/family education    PT  Goals (Current goals can be found in the Care Plan section)  Acute Rehab PT Goals Patient Stated Goal: to go home tomorrow PT Goal Formulation: With patient Time For Goal Achievement: 11/09/18 Potential to Achieve Goals: Good    Frequency Min 3X/week           AM-PAC PT "6 Clicks" Mobility  Outcome Measure Help needed turning from your back to your side while in a flat bed without using bedrails?: None Help needed moving from lying on your back to sitting on the side of a flat bed without using bedrails?: None Help needed moving to and from a bed to a chair (including a wheelchair)?: None Help needed standing up from a chair using your arms (e.g., wheelchair or bedside chair)?: None Help needed to walk in hospital room?: None Help needed climbing 3-5 steps with a railing? : None 6 Click Score: 24    End of Session   Activity Tolerance: Patient tolerated treatment well Patient left: in chair;with call bell/phone within reach   PT Visit Diagnosis: Muscle weakness (generalized) (M62.81);Difficulty in walking, not elsewhere classified (R26.2)    Time: 0272-5366 PT Time Calculation (min) (ACUTE ONLY): 22 min   Charges:       Wells Guiles B. Mccoy Testa, PT, DPT  Acute Rehabilitation [phone] pager [phone]) [phone] office  @ [facility]: [phone]   PT Evaluation $PT Eval Moderate Complexity: 1 Mod          10/26/2018, 11:10 AM

## 2018-10-26 NOTE — Progress Notes (Signed)
Patient keys taken to family member at front entrance at this time.

## 2018-10-26 NOTE — Chronic Care Management (AMB) (Signed)
  Chronic Care Management   Outreach Note  10/26/2018 Name: [name]. MRN: [id number] DOB: 1945-07-17  Referred by: [name], DO Reason for referral : Chronic Care Management [facility] Follow up )   An unsuccessful telephone outreach was attempted today. The patient was referred to the case management team by for assistance with care management and care coordination.  Attempted to reach patient per his mobile phone to let him know we are thinking of him while he is inpatient. Did not leave a voicemail.   Follow Up Plan: The care management team will reach out to the patient again over the next 30 days.    Merlene Morse Demont Linford RN, BSN Nurse Case Pharmacist, [facility] Care Management  [phone]) Business Mobile

## 2018-10-26 NOTE — Progress Notes (Signed)
Sister [name] called. No answer. Awaiting  for a call back.

## 2018-10-27 ENCOUNTER — Ambulatory Visit: Payer: Medicare HMO

## 2018-10-27 LAB — CULTURE, BLOOD (ROUTINE X 2)
Culture: NO GROWTH
Culture: NO GROWTH
Special Requests: ADEQUATE
Special Requests: ADEQUATE

## 2018-10-27 LAB — COMPREHENSIVE METABOLIC PANEL
ALT: 31 U/L (ref 0–44)
AST: 27 U/L (ref 15–41)
Albumin: 2.4 g/dL — ABNORMAL LOW (ref 3.5–5.0)
Alkaline Phosphatase: 49 U/L (ref 38–126)
Anion gap: 9 (ref 5–15)
BUN: 14 mg/dL (ref 8–23)
CO2: 23 mmol/L (ref 22–32)
Calcium: 8 mg/dL — ABNORMAL LOW (ref 8.9–10.3)
Chloride: 103 mmol/L (ref 98–111)
Creatinine, Ser: 0.71 mg/dL (ref 0.61–1.24)
GFR calc Af Amer: >60 mL/min (ref >60)
GFR calc non Af Amer: >60 mL/min (ref >60)
Glucose, Bld: 128 mg/dL — ABNORMAL HIGH (ref 70–99)
Potassium: 4.3 mmol/L (ref 3.5–5.1)
Sodium: 135 mmol/L (ref 135–145)
Total Bilirubin: 0.6 mg/dL (ref 0.3–1.2)
Total Protein: 5.6 g/dL — ABNORMAL LOW (ref 6.5–8.1)

## 2018-10-27 LAB — GLUCOSE, CAPILLARY
Glucose-Capillary: 144 mg/dL — ABNORMAL HIGH (ref 70–99)
Glucose-Capillary: 291 mg/dL — ABNORMAL HIGH (ref 70–99)
Glucose-Capillary: 242 mg/dL — ABNORMAL HIGH (ref 70–99)
Glucose-Capillary: 224 mg/dL — ABNORMAL HIGH (ref 70–99)
Glucose-Capillary: 204 mg/dL — ABNORMAL HIGH (ref 70–99)

## 2018-10-27 LAB — C-REACTIVE PROTEIN: CRP: 10.6 mg/dL — ABNORMAL HIGH (ref <1.0)

## 2018-10-27 MED ORDER — DEXAMETHASONE 6 MG PO TABS
6.0000 mg | ORAL_TABLET | Freq: Two times a day (BID) | ORAL | Status: DC
  Administered 2018-10-27 – 2018-10-28 (×2): 6 mg via ORAL
  Filled 2018-10-27 (×2): qty 1

## 2018-10-27 NOTE — Consult Note (Signed)
   [facility] CM Inpatient Consult   10/27/2018  Shalev Helminiak 10-13-1945 [id number]  Patient was assessed for [place] Management for community services. Patient was previously active with [place] [facility].  Patient reviewed for possible restart of services for post hospital transition. Patient in the [place].  Patient had been outreached by an [facility] Pharmacist for diabetes management and medication financial assistance.  Chart reviewed for post hospital potential needs. MD progress notes reveals as follows but not limited to:  Nashua [place]wood. is a 73 y.o. male with a history of T2DM, HTN, HLD and covid-19 diagnosis 9/29 who presented to [facility] ED 10/1 for progressive fatigue and poor per oral intake with nausea in the setting of diffuse weakness and nonproductive cough starting about 1 week prior. In the ED he was found to be febrile with hypoxia with diffuse opacities on CXR with LLL predominance. Decadron was given and the patient was admitted to [facility] after SARS-CoV-2 PCR was confirmed to be positive.  Primary Care Provider: [place] center with  [name], DO, this office is an Embedded practice with Embedded Chronic Care Management are following.   Plan:  Updated Embedded RNCM of patient's potential transition home. Also, follow up with inpatient [facility] team done for any additional barriers for care. Called patient's hospital room twice today without success,  Inpatient TOC RNCM aware of [facility] and ECM teams.    Of note, [facility] Care Management services does not replace or interfere with any services that are arranged by inpatient [facility] care management team.   For additional questions or referrals please contact:  Natividad Brood, RN BSN [facility] Liaison  [phone] business mobile phone Toll free office [phone]  Fax number: [phone] [email or web address]

## 2018-10-27 NOTE — TOC Transition Note (Signed)
Transition of Care [facility]) - CM/SW Discharge Note   Patient Details  Name: [name]. MRN: [id number] Date of Birth: 1945/04/28  Transition of Care [facility]) CM/SW Contact:  Ninfa Meeker, RN Phone Number: [phone] (working remotely) 10/27/2018, 9:44 AM   Clinical Narrative:  73 yr old male admitted and treated for COVID 19. Thankfully he is improving and will discharge home. Patient will be followed by [facility] community care management, per conversation with Natividad Brood. She will be reaching out to patient and family.       Barriers to Discharge: Continued Medical Work up   Patient Goals and CMS Choice Patient states their goals for this hospitalization and ongoing recovery are:: to get patient home safely(sister) CMS Medicare.gov Compare Post Acute Care list provided to:: Patient Represenative (must comment)(sister-[name]) Choice offered to / list presented to : Sibling  Discharge Placement                       Discharge Plan and Services   Discharge Planning Services: CM Consult                                 Social Determinants of Health (SDOH) Interventions     Readmission Risk Interventions No flowsheet data found.

## 2018-10-27 NOTE — Progress Notes (Signed)
PROGRESS NOTE  Christopher Bruinsobert Resler Jr.  ZOX:[id number] DOB: [birth year] DOA: 10/22/2018 PCP: [name], Alexander J, DO  Brief Narrative: Christopher BruinsRobert Lemoine Jr. is a 73 y.o. male with a history of T2DM, HTN, HLD and covid-19 diagnosis 9/29 who presented to [facility] ED 10/1 for progressive fatigue and poor per oral intake with nausea in the setting of diffuse weakness and nonproductive cough starting about 1 week prior. In the ED he was found to be febrile with hypoxia with diffuse opacities on CXR with LLL predominance. Decadron was given and the patient [facility] after SARS-CoV-2 PCR was confirmed to be positive. Remdesivir was started on arrival and antibiotics have been added on 10/2.  Assessment & Plan: Principal Problem:   Pneumonia due to COVID-19 virus Active Problems:   Essential hypertension   Hyperlipidemia associated with type 2 diabetes mellitus (HCC)   Controlled type 2 diabetes mellitus with retinopathy (HCC)   Acute respiratory failure due to COVID-19 (HCC)   Hyponatremia   Acute hypoxic respiratory failure due to covid-19 pneumonia, possible superimposed CAP: Bilateral CXR infiltrates and lymphopenia without neutrophilia, in addition to other findings, is more consistent with covid infection alone. Predominance of LLL opacity, mild leftward shift, and elevated proclacitonin (0.97) argue for bacterial component as well.  - Treat as covid and CAP with remdesivir (10/1 - 10/5), steroids (10/1 - 10/10) and CTX/azithromycin (10/2 - 10/6).   - Weaned oxygen at rest, down to 88% on room air on ambulation 10/3. - Given tobacco use history and +FH of CA, would recommend repeating imaging after convalescence to confirm resolution of infiltrate.  - CRP, ferritin, d-dimer sustaining improvement until CRP 7.5 > 13.5 on 10/5, now down but grossly elevated. Will complete abx and repeat testing with ferritin, d-dimer, CBC 10/6.  - Continue airborne, contact precautions. PPE including surgical gown,  gloves, cap, shoe covers, and CAPR used during this encounter in a negative pressure room.  - Check daily labs: CBC w/diff, CMP, d-dimer, ferritin, CRP.  - Blood cultures drawn on admission, NGTD.  - Maintain euvolemia/net negative.  - Avoid NSAIDs - Recommend proning and aggressive use of incentive spirometry.  T2DM: HbA1c 6.3%. - Holding glipizide, metformin, trulicity and instead giving moderate SSI while admitted. Started lantus and increased SSI intensity with improvement, at inpatient goal. Continue this.  HTN:  - Holding ACEi and thiazide for now. Currently normotensive.  Hyperlipidemia:  - Continue statin. LFTs wnl and will be monitored while on remdesivir.   Hyponatremia: With poor per oral intake and concomitant diuretic use, suspect hypovolemic etiology. Has resolved.  DVT prophylaxis: Lovenox 40mg  q24h (~0.5mg /kg) Code Status: Full Family Communication: None at bedside. Called sisters at listed numbers yesterday again without answer Disposition Plan: Anticipate DC in next 24 hours if hypoxia improves. Was hypoxic with ambulation and inflammatory markers rose. Completing abx today.  Consultants:   None  Procedures:   None  Antimicrobials: Remdesivir 10/1 - 10/5 Ceftriaxone, azithromycin 10/2 - 10/6   Subjective: Still somewhat short of breath on exertion, but eating a little better. Feels weak but no fevers or chills. No leg swelling or chest pain.  Objective: Vitals:   10/26/18 1633 10/26/18 2000 10/27/18 0800 10/27/18 0807  BP: (!) 144/83 (!) 162/82  (!) 142/88  Pulse:    (!) 57  Resp: 17 18    Temp: 98.6 F (37 C) 98.2 F (36.8 C) 97.9 F (36.6 C)   TempSrc: Oral Oral Oral   SpO2:    98%  Weight:  Height:        Intake/Output Summary (Last 24 hours) at 10/27/2018 1500 Last data filed at 10/27/2018 1100 Gross per 24 hour  Intake 593 ml  Output -  Net 593 ml   Filed Weights   10/22/18 2342  Weight: 83.8 kg   Gen: 73 y.o. male in no  distress Pulm: Nonlabored at rest. Clear. CV: Regular rate and rhythm. No murmur, rub, or gallop. No JVD, no dependent edema. GI: Abdomen soft, non-tender, non-distended, with normoactive bowel sounds.  Ext: Warm, no deformities Skin: No rashes, lesions or ulcers on visualized skin. Neuro: Alert and oriented. No focal neurological deficits. Psych: Judgement and insight appear fair. Mood euthymic & affect congruent. Behavior is appropriate.    Data Reviewed: I have personally reviewed following labs and imaging studies  CBC: Recent Labs  Lab 10/22/18 1224 10/23/18 0256 10/24/18 0347 10/25/18 0524  WBC 7.5 5.1 7.0 5.7  NEUTROABS 6.8 4.6 6.1 4.6  HGB 13.7 13.1 13.6 13.6  HCT 39.4 39.3 40.9 40.8  MCV 87.2 90.8 90.9 90.7  PLT 235 269 315 300   Basic Metabolic Panel: Recent Labs  Lab 10/23/18 0256 10/24/18 0347 10/25/18 0524 10/26/18 0515 10/27/18 0305  NA 132* 134* 138 136 135  K 4.0 4.5 4.8 4.1 4.3  CL 96* 99 103 103 103  CO2 GLUCOSE 285* 200* 142* 143* 128*  BUN 30* 34* 25* 17 14  CREATININE 0.94 0.95 0.89 0.84 0.71  CALCIUM 8.2* 8.4* 8.5* 8.3* 8.0*   Liver Function Tests: Recent Labs  Lab 10/23/18 0256 10/24/18 0347 10/25/18 0524 10/26/18 0515 10/27/18 0305  AST 38 27  ALT 36 31  ALKPHOS 52 50 51 52 49  BILITOT 0.7 0.4 0.5 0.9 0.6  PROT 6.9 6.6 6.5 6.4* 5.6*  ALBUMIN 2.7* 2.7* 2.7* 2.7* 2.4*   HbA1C: No results for input(s): HGBA1C in the last 72 hours. CBG: Recent Labs  Lab 10/26/18 0806 10/26/18 1149 10/26/18 1628 10/26/18 2115 10/27/18 0817  GLUCAP 176* 224* 115* 191* 144*   Lipid Profile: No results for input(s): CHOL, HDL, LDLCALC, TRIG, CHOLHDL, LDLDIRECT in the last 72 hours. Thyroid Function Tests: No results for input(s): TSH, T4TOTAL, FREET4, T3FREE, THYROIDAB in the last 72 hours. Anemia Panel: Recent Labs    10/25/18 0524  FERRITIN 602*   Urine analysis: No results found for: COLORURINE,  APPEARANCEUR, LABSPEC, PHURINE, GLUCOSEU, HGBUR, BILIRUBINUR, KETONESUR, PROTEINUR, UROBILINOGEN, NITRITE, LEUKOCYTESUR Recent Results (from the past 240 hour(s))  SARS Coronavirus 2 [facility] order, Performed in [facility] hospital lab) Nasopharyngeal Nasopharyngeal Swab     Status: Abnormal   Collection Time: 10/22/18 12:39 PM   Specimen: Nasopharyngeal Swab  Result Value Ref Range Status   SARS Coronavirus 2 POSITIVE (A) NEGATIVE Final    Comment: RESULT CALLED TO, READ BACK BY AND VERIFIED WITH: ANNIE SMITH AT 1358 ON 10/22/2018 [facility]. (NOTE) If result is NEGATIVE SARS-CoV-2 target nucleic acids are NOT DETECTED. The SARS-CoV-2 RNA is generally detectable in upper and lower  respiratory specimens during the acute phase of infection. The lowest  concentration of SARS-CoV-2 viral copies this assay can detect is 250  copies / mL. A negative result does not preclude SARS-CoV-2 infection  and should not be used as the sole basis for treatment or other  patient management decisions.  A negative result may occur with  improper specimen collection / handling, submission of specimen other  than nasopharyngeal swab, presence  of viral mutation(s) within the  areas targeted by this assay, and inadequate number of viral copies  (<250 copies / mL). A negative result must be combined with clinical  observations, patient history, and epidemiological information. If result is POSITIVE SARS-CoV-2 target nucleic acids are DETECTED.  The SARS-CoV-2 RNA is generally detectable in upper and lower  respiratory specimens during the acute phase of infection.  Positive  results are indicative of active infection with SARS-CoV-2.  Clinical  correlation with patient history and other diagnostic information is  necessary to determine patient infection status.  Positive results do  not rule out bacterial infection or co-infection with other viruses. If result is PRESUMPTIVE POSTIVE SARS-CoV-2 nucleic acids MAY  BE PRESENT.   A presumptive positive result was obtained on the submitted specimen  and confirmed on repeat testing.  While 2019 novel coronavirus  (SARS-CoV-2) nucleic acids may be present in the submitted sample  additional confirmatory testing may be necessary for epidemiological  and / or clinical management purposes  to differentiate between  SARS-CoV-2 and other Sarbecovirus currently known to infect humans.  If clinically indicated additional testing with an alternate test  methodology [phone])  is advised. The SARS-CoV-2 RNA is generally  detectable in upper and lower respiratory specimens during the acute  phase of infection. The expected result is Negative. Fact Sheet for Patients:  StrictlyIdeas.no Fact Sheet for Healthcare Providers: BankingDealers.co.za This test is not yet approved or cleared by the [place] FDA and has been authorized for detection and/or diagnosis of SARS-CoV-2 by FDA under an Emergency Use Authorization (EUA).  This EUA will remain in effect (meaning this test can be used) for the duration of the COVID-19 declaration under Section 564(b)(1) of the Act, 21 U.S.C. section 360bbb-3(b)(1), unless the authorization is terminated or revoked sooner. Performed at [facility], [address] [place], [place] [address]   Blood Culture (routine x 2)     Status: None   Collection Time: 10/22/18 12:39 PM   Specimen: BLOOD  Result Value Ref Range Status   Specimen Description BLOOD BLOOD LEFT FOREARM  Final   Special Requests   Final    BOTTLES DRAWN AEROBIC AND ANAEROBIC Blood Culture adequate volume   Culture   Final    NO GROWTH 5 DAYS Performed at [facility], [address] [place], [place] [address]    Report Status 10/27/2018 FINAL  Final  Blood Culture (routine x 2)     Status: None   Collection Time: 10/22/18 12:39 PM   Specimen: BLOOD  Result Value Ref Range Status    Specimen Description BLOOD LEFT ANTECUBITAL  Final   Special Requests   Final    BOTTLES DRAWN AEROBIC AND ANAEROBIC Blood Culture adequate volume   Culture   Final    NO GROWTH 5 DAYS Performed at [facility], [address] [place], [place] [address]    Report Status 10/27/2018 FINAL  Final      Radiology Studies: No results found.  Scheduled Meds: . dexamethasone  6 mg Oral Q12H  . docusate sodium  100 mg Oral BID  . enoxaparin (LOVENOX) injection  40 mg Subcutaneous QHS  . insulin aspart  0-20 Units Subcutaneous TID WC  . insulin aspart  0-5 Units Subcutaneous QHS  . insulin glargine  5 Units Subcutaneous QHS  . pravastatin  10 mg Oral q1800  . sodium chloride flush  3 mL Intravenous Q12H   Continuous Infusions: . sodium chloride  LOS: 5 days   Time spent: 25 minutes.  Tyrone Nine, MD [facility] [email or web address] Password [email or web address] 10/27/2018, 3:00 PM

## 2018-10-28 ENCOUNTER — Ambulatory Visit: Payer: Self-pay | Admitting: Licensed Clinical Social Worker

## 2018-10-28 ENCOUNTER — Ambulatory Visit: Payer: Self-pay | Admitting: Pharmacist

## 2018-10-28 DIAGNOSIS — U071 COVID-19: Secondary | ICD-10-CM

## 2018-10-28 DIAGNOSIS — E113293 Type 2 diabetes mellitus with mild nonproliferative diabetic retinopathy without macular edema, bilateral: Secondary | ICD-10-CM

## 2018-10-28 LAB — GLUCOSE, CAPILLARY
Glucose-Capillary: 185 mg/dL — ABNORMAL HIGH (ref 70–99)
Glucose-Capillary: 271 mg/dL — ABNORMAL HIGH (ref 70–99)

## 2018-10-28 MED ORDER — ACETAMINOPHEN 325 MG PO TABS: 650.0000 mg | ORAL_TABLET | Freq: Four times a day (QID) | ORAL | Status: AC | PRN

## 2018-10-28 MED ORDER — DEXAMETHASONE 6 MG PO TABS: 6.0000 mg | ORAL_TABLET | Freq: Every day | ORAL | Status: DC

## 2018-10-28 NOTE — TOC Transition Note (Addendum)
   Transition of Care [facility]) - CM/SW Discharge Note   Patient Details  Name: [name]. MRN: [id number] Date of Birth: [birth year]  [facility]) CM/SW Contact:  Maryclare Labrador, RN Phone Number: 10/28/2018, 2:52 PM   Clinical Narrative:   Pt to [facility] home today, sister will transport pt home.  Pt in agreement with [facility] for disease management - CM offered HH choice per medicare.gov - pt chose [facility] contacted and referral accepted,  Agency aware that pt has COVID.  NO other CM needs identified at this time - CM signing off  Update:  [facility] contacted CM and informed they can no longer accept pt.  Pt chose [facility] as second choice - agency accepted - agency aware that pt is covid positive.  Pt discharged home prior to [facility] accepting pt - CM unable to inform pt as both home and mobile VM are not accepting messages    Final next level of care: [place] Barriers to Discharge: Barriers Resolved   Patient Goals and CMS Choice Patient states their goals for this hospitalization and ongoing recovery are:: Pt states he is ready to get home and get back to [place]a, pt did not specifiy a goal CMS Medicare.gov Compare Post Acute Care list provided to:: Patient Choice offered to / list presented to : Patient  Discharge Placement                       Discharge Plan and Services   Discharge Planning Services: CM Consult                      HH Arranged: RN, Disease Management [place] Agency: [facility] [employer] Date [place]: 10/28/18 Time [place]: 1452 Representative spoke with at [place]: [place] ([place]) Interventions     Readmission Risk Interventions No flowsheet data found.

## 2018-10-28 NOTE — Patient Instructions (Signed)
Thank you allowing the Chronic Care Management Team to be a part of your care! It was a pleasure speaking with you today!     CCM (Chronic Care Management) Team    Janci Minor RN, BSN Nurse Care Coordinator  [phone]   Harlow Asa PharmD  Clinical Pharmacist  [phone]   Eula Fried LCSW Clinical Social Worker [phone]  Visit Information  Goals Addressed            This Visit's Progress   . PharmD - "I want to get my A1C down" (pt-stated)       Current Barriers:  [name] Medication Adherence  . Financial Barriers . Knowledge deficits related to nutrition with diabetes  Pharmacist Clinical Goal(s):  [name] Over the next 30 days, patient will continue to work with PharmD to address needs related to address needs related to diabetes management.  . Over the next 30 days, patient will demonstrate maintained medication adherence as evidenced by patient report of adherence  Interventions:   . Coordination of care call to [place] patient will be receiving [facility] nursing service through [place] . Follow up call to Mr. Splitt o Patient reports that he has just reached home, driven by family o Reports that he will be staying home in quarantine until Friday, 10/30/18, as directed at discharge today. o Denies any needs today. Reports having his medications  . Advise patient that he should expect to be contacted by [facility] regarding [place] RN services by Friday o Provide patient with contact phone number for [facility]: [phone] . Will collarate with CM Care Team  Patient Self Care Activities:  . Self administers all medications as prescribed . To check blood sugars daily to twice daily and keep log. . To contact pharmacy to order refills of medication . Calls provider office for new concerns or questions. . Patient to eat regular meals  Please see past updates related to this goal by clicking on the "Past Updates" button in the  selected goal         The patient verbalized understanding of instructions provided today and declined a print copy of patient instruction materials.   Telephone follow up appointment with care management team member scheduled for: 10/8  Harlow Asa, PharmD, [place] [facility] [phone]

## 2018-10-28 NOTE — Progress Notes (Signed)
Ambulated pt in halllway we went around 2x . Pt was asymptomatic. sats went to 86 and back uip to 96 with in a few seconds while still walking'

## 2018-10-28 NOTE — Chronic Care Management (AMB) (Signed)
  Chronic Care Management   Social Work Note  10/28/2018 Name: [name]. MRN: [id number] DOB: July 20, 1945  [name]. is a 73 y.o. year old male who sees [name], DO for primary care. The CCM team was consulted for assistance with Level of Care Concerns.   Facility-Administered Encounter Medications as of 10/28/2018  Medication  . 0.9 %  sodium chloride infusion  . acetaminophen (TYLENOL) tablet 650 mg  . [START ON 10/29/2018] dexamethasone (DECADRON) tablet 6 mg  . docusate sodium (COLACE) capsule 100 mg  . enoxaparin (LOVENOX) injection 40 mg  . insulin aspart (novoLOG) injection 0-20 Units  . insulin aspart (novoLOG) injection 0-5 Units  . insulin glargine (LANTUS) injection 5 Units  . Ipratropium-Albuterol (COMBIVENT) respimat 1 puff  . ondansetron (ZOFRAN) tablet 4 mg   Or  . ondansetron (ZOFRAN) injection 4 mg  . pravastatin (PRAVACHOL) tablet 10 mg  . sodium chloride flush (NS) 0.9 % injection 3 mL  . sodium chloride flush (NS) 0.9 % injection 3 mL   Outpatient Encounter Medications as of 10/28/2018  Medication Sig Note  . ACCU-CHEK AVIVA PLUS test strip Check sugar up to 2 x daily   . Accu-Chek FastClix Lancets MISC Check sugar up to 2 x daily   . Accu-Chek Softclix Lancets lancets Check sugar up to 2 x daily   . acetaminophen (TYLENOL) 325 MG tablet Take 2 tablets (650 mg total) by mouth every 6 (six) hours as needed for mild pain or headache (fever >/= 101).   . benzonatate (TESSALON) 100 MG capsule Take 1 capsule (100 mg total) by mouth 3 (three) times daily as needed for cough.   . Blood Glucose Monitoring Suppl (ACCU-CHEK AVIVA PLUS) w/Device KIT Use to check blood sugar up to x 2 daily   . Dulaglutide (TRULICITY) 1.5 YK/9.9IP SOPN Inject 1.5 mg into the skin once a week. 07/01/2018: On Tuesdays  . glipiZIDE (GLUCOTROL) 5 MG tablet Take 0.5 tablets (2.5 mg total) by mouth daily before breakfast.   . lisinopril-hydrochlorothiazide (ZESTORETIC)  20-12.5 MG tablet Take 1 tablet by mouth daily.   . metFORMIN (GLUCOPHAGE) 1000 MG tablet TAKE 1 TABLET(1000 MG) BY MOUTH TWICE DAILY WITH A MEAL   . ondansetron (ZOFRAN) 4 MG tablet Take 1 tablet (4 mg total) by mouth every 8 (eight) hours as needed for nausea or vomiting.   . pravastatin (PRAVACHOL) 10 MG tablet TAKE 1 TABLET(10 MG) BY MOUTH DAILY   . Selenium Sulfide 2.25 % SHAM APPLY EXTERNALLY TO THE AFFECTED AREA DAILY AS NEEDED FOR IRRITATION   . tadalafil (ADCIRCA/CIALIS) 20 MG tablet Take 1 tablet (20 mg total) by mouth daily as needed for erectile dysfunction.     LCSW received message from [place] asking if patient would be eligible for HCP services due to current hospitalization and low support. Paitent is new to LCSW. LCSW sent in basket message/referral for HCP to [facility] Liaison, Assistant Clinical Director of [place] Management and CCM [facility] for follow up and guidance.   Follow Up Plan: SW will follow up with patient by phone over the next 30 days  Eula Fried, [employer], MSW, [email or web address] Phone: [phone]

## 2018-10-28 NOTE — Care Management Important Message (Signed)
Important Message  Patient Details  Name: [name]. MRN: [id number] Date of Birth: 03-29-1945   Medicare Important Message Given:  Yes - Important Message mailed due to current National Emergency   Verbal consent obtained due to current National Emergency  Relationship to patient: Brother/Sister Contact Name: Colon Flattery Call Date: 10/28/18  Time: 1529 Phone: [phone] Outcome: Spoke with contact Important Message mailed to: Emergency contact on file     Tommy Medal 10/28/2018, 3:30 PM

## 2018-10-28 NOTE — Discharge Summary (Signed)
DISCHARGE SUMMARY  [name].  MR#: [id number]  DOB:11/17/1945  Date of Admission: 10/22/2018 Date of Discharge: 10/28/2018  Attending Physician:Christopher Sprinkle Hennie Duos, MD  Patient's NWG:[id number], Christopher Doughty, DO  Consults: None  Disposition: D/C home   Follow-up Appts: Follow-up Information    Christopher Hauser, DO Follow up in 3 day(s).   Specialty: Family Medicine Contact information: [place] [id number] [phone]           Tests Needing Follow-up: -Assess for hypoxia -Assess CBG control  Discharge Diagnoses: COVID pneumonia Acute hypoxic respiratory failure DM 2 HTN HLD Hyponatremia  Initial presentation: 73 year old with a history of DM 2, HTN, and HLD who was diagnosed with COVID 9/29 and presented to [facility] ED 10/1 with complaints of progressive fatigue, severe generalized weakness, nonproductive cough, and very poor oral intake.  In the ED he was found to be febrile and hypoxic with diffuse opacities on chest x-ray.  The patient was admitted to [facility].  COVID-19 specific Treatment: Remdesivir 10/1 >10/5 Decadron 10/1 > 10/7  Hospital Course: 9/29 SARS-CoV-2 test positive 10/1 admit to [facility] via [facility] ED 10/7 discharge home  The patient was admitted to [facility] with a chest x-ray noting bilateral infiltrates and labs consistent with lymphopenia.  He was treated with Remdesivir as well as steroids.  Given some initial concern for possible overlying bacterial community-acquired pneumonia he was also treated with a short course of ceftriaxone and azithromycin.  He initially required oxygen support but improved to the point that this was able to be discontinued prior to his discharge, with sats remaining in the upper 80s even with exertion.  Given that the patient experienced mild to moderate COVID he was advised that he should quarantine for a total of 10 days from symptom onset, which coincides with 10/30/2018.   Fortunately the patient's diabetes remained well controlled during this hospital stay despite the use of steroids.  Steroids are being discontinued at the time of his discharge in hopes of not further complicating his diabetic control at home.   Likewise he will return to his usual blood pressure and hyperlipidemia treatment protocol.  The patient was evaluated by physical therapy prior to his discharge and it was felt that he was safe to return to his independent home living situation.    Allergies as of 10/28/2018      Reactions   2,4-d Dimethylamine (amisol) Other (See Comments)   Tetanus Toxoid Nausea And Vomiting   Tetanus-diphtheria Toxoids Td Other (See Comments)      Medication List    TAKE these medications   Accu-Chek Aviva Plus test strip Generic drug: glucose blood Check sugar up to 2 x daily   Accu-Chek Aviva Plus w/Device Kit Use to check blood sugar up to x 2 daily   Accu-Chek FastClix Lancets Misc Check sugar up to 2 x daily   Accu-Chek Softclix Lancets lancets Check sugar up to 2 x daily   acetaminophen 325 MG tablet Commonly known as: TYLENOL Take 2 tablets (650 mg total) by mouth every 6 (six) hours as needed for mild pain or headache (fever >/= 101).   benzonatate 100 MG capsule Commonly known as: TESSALON Take 1 capsule (100 mg total) by mouth 3 (three) times daily as needed for cough.   Dulaglutide 1.5 MG/0.5ML Sopn Commonly known as: Trulicity Inject 1.5 mg into the skin once a week.   glipiZIDE 5 MG tablet Commonly known as: GLUCOTROL Take 0.5 tablets (2.5 mg  total) by mouth daily before breakfast.   lisinopril-hydrochlorothiazide 20-12.5 MG tablet Commonly known as: ZESTORETIC Take 1 tablet by mouth daily.   metFORMIN 1000 MG tablet Commonly known as: GLUCOPHAGE TAKE 1 TABLET(1000 MG) BY MOUTH TWICE DAILY WITH A MEAL   ondansetron 4 MG tablet Commonly known as: ZOFRAN Take 1 tablet (4 mg total) by mouth every 8 (eight) hours as needed  for nausea or vomiting.   pravastatin 10 MG tablet Commonly known as: PRAVACHOL TAKE 1 TABLET(10 MG) BY MOUTH DAILY   Selenium Sulfide 2.25 % Sham APPLY EXTERNALLY TO THE AFFECTED AREA DAILY AS NEEDED FOR IRRITATION   tadalafil 20 MG tablet Commonly known as: CIALIS Take 1 tablet (20 mg total) by mouth daily as needed for erectile dysfunction.       Day of Discharge BP 124/68   Pulse 64   Temp 97.6 F (36.4 C)   Resp 18   Ht 5' 11"  (1.803 m)   Wt 83.8 kg   SpO2 96%   BMI 25.77 kg/m   Physical Exam: General: No acute respiratory distress Lungs: Clear to auscultation bilaterally without wheezes or crackles Cardiovascular: Regular rate and rhythm without murmur gallop or rub normal S1 and S2 Abdomen: Nontender, nondistended, soft, bowel sounds positive, no rebound, no ascites, no appreciable mass Extremities: No significant cyanosis, clubbing, or edema bilateral lower extremities  Basic Metabolic Panel: Recent Labs  Lab 10/23/18 0256 10/24/18 0347 10/25/18 0524 10/26/18 0515 10/27/18 0305  NA 132* 134* 138 136 135  K 4.0 4.5 4.8 4.1 4.3  CL 96* 99 103 103 103  CO2 24 25 26 23 23   GLUCOSE 285* 200* 142* 143* 128*  BUN 30* 34* 25* 17 14  CREATININE 0.94 0.95 0.89 0.84 0.71  CALCIUM 8.2* 8.4* 8.5* 8.3* 8.0*    Liver Function Tests: Recent Labs  Lab 10/23/18 0256 10/24/18 0347 10/25/18 0524 10/26/18 0515 10/27/18 0305  AST 28 27 29  38 27  ALT 21 24 24  36 31  ALKPHOS 52 50 51 52 49  BILITOT 0.7 0.4 0.5 0.9 0.6  PROT 6.9 6.6 6.5 6.4* 5.6*  ALBUMIN 2.7* 2.7* 2.7* 2.7* 2.4*   CBC: Recent Labs  Lab 10/22/18 1224 10/23/18 0256 10/24/18 0347 10/25/18 0524  WBC 7.5 5.1 7.0 5.7  NEUTROABS 6.8 4.6 6.1 4.6  HGB 13.7 13.1 13.6 13.6  HCT 39.4 39.3 40.9 40.8  MCV 87.2 90.8 90.9 90.7  PLT 235 269 315 300    CBG: Recent Labs  Lab 10/27/18 1618 10/27/18 2104 10/27/18 2139 10/28/18 0741 10/28/18 1156  GLUCAP 242* 224* 204* 185* 271*    Recent  Results (from the past 240 hour(s))  SARS Coronavirus 2 [facility] order, Performed in [facility] hospital lab) Nasopharyngeal Nasopharyngeal Swab     Status: Abnormal   Collection Time: 10/22/18 12:39 PM   Specimen: Nasopharyngeal Swab  Result Value Ref Range Status   SARS Coronavirus 2 POSITIVE (A) NEGATIVE Final    Comment: RESULT CALLED TO, READ BACK BY AND VERIFIED WITH: ANNIE SMITH AT 1610 ON 10/22/2018 [place]. (NOTE) If result is NEGATIVE SARS-CoV-2 target nucleic acids are NOT DETECTED. The SARS-CoV-2 RNA is generally detectable in upper and lower  respiratory specimens during the acute phase of infection. The lowest  concentration of SARS-CoV-2 viral copies this assay can detect is 250  copies / mL. A negative result does not preclude SARS-CoV-2 infection  and should not be used as the sole basis for treatment or other  patient management decisions.  A negative result may occur with  improper specimen collection / handling, submission of specimen other  than nasopharyngeal swab, presence of viral mutation(s) within the  areas targeted by this assay, and inadequate number of viral copies  (<250 copies / mL). A negative result must be combined with clinical  observations, patient history, and epidemiological information. If result is POSITIVE SARS-CoV-2 target nucleic acids are DETECTED.  The SARS-CoV-2 RNA is generally detectable in upper and lower  respiratory specimens during the acute phase of infection.  Positive  results are indicative of active infection with SARS-CoV-2.  Clinical  correlation with patient history and other diagnostic information is  necessary to determine patient infection status.  Positive results do  not rule out bacterial infection or co-infection with other viruses. If result is PRESUMPTIVE POSTIVE SARS-CoV-2 nucleic acids MAY BE PRESENT.   A presumptive positive result was obtained on the submitted specimen  and confirmed on repeat testing.  While  2019 novel coronavirus  (SARS-CoV-2) nucleic acids may be present in the submitted sample  additional confirmatory testing may be necessary for epidemiological  and / or clinical management purposes  to differentiate between  SARS-CoV-2 and other Sarbecovirus currently known to infect humans.  If clinically indicated additional testing with an alternate test  methodology [phone])  is advised. The SARS-CoV-2 RNA is generally  detectable in upper and lower respiratory specimens during the acute  phase of infection. The expected result is Negative. Fact Sheet for Patients:  StrictlyIdeas.no Fact Sheet for Healthcare Providers: BankingDealers.co.za This test is not yet approved or cleared by the [place] FDA and has been authorized for detection and/or diagnosis of SARS-CoV-2 by FDA under an Emergency Use Authorization (EUA).  This EUA will remain in effect (meaning this test can be used) for the duration of the COVID-19 declaration under Section 564(b)(1) of the Act, 21 U.S.C. section 360bbb-3(b)(1), unless the authorization is terminated or revoked sooner. Performed at [facility], [address] [place], [place] [address]   Blood Culture (routine x 2)     Status: None   Collection Time: 10/22/18 12:39 PM   Specimen: BLOOD  Result Value Ref Range Status   Specimen Description BLOOD BLOOD LEFT FOREARM  Final   Special Requests   Final    BOTTLES DRAWN AEROBIC AND ANAEROBIC Blood Culture adequate volume   Culture   Final    NO GROWTH 5 DAYS Performed at [facility], [address] [place], [place] [address]    Report Status 10/27/2018 FINAL  Final  Blood Culture (routine x 2)     Status: None   Collection Time: 10/22/18 12:39 PM   Specimen: BLOOD  Result Value Ref Range Status   Specimen Description BLOOD LEFT ANTECUBITAL  Final   Special Requests   Final    BOTTLES DRAWN AEROBIC AND ANAEROBIC Blood  Culture adequate volume   Culture   Final    NO GROWTH 5 DAYS Performed at [facility], [address], [place], [place] [address]    Report Status 10/27/2018 FINAL  Final     Time spent in discharge (includes decision making & examination of pt): 35 minutes  10/28/2018, 3:50 PM   [name], MD [facility] Office  [phone]

## 2018-10-28 NOTE — Consult Note (Signed)
   [facility] CM Inpatient Consult   10/28/2018  [name]. 03-Jun-1945 [id number]   Follow up: [place] with the patient via hospital phone, and HIPAA verified with 2 identifiers.  Patient states he is for returning home today.  He states his sisters, Gwendolyn Lima is a good contact for him at [phone] or Burgess Amor at [phone].  Patient states he lives alone and his family is assisting him with getting things together at his home. He is not sure anyone will be able to stay with him. However, he felt like having a nurse to check on him would be good when he got home.  Spoke with inpatient [facility] RNCM regarding patient's need.    For questions, please contact:  Natividad Brood, RN BSN [facility] Liaison  [phone] business mobile phone Toll free office [phone]  Fax number: [phone] [email or web address]

## 2018-10-28 NOTE — Chronic Care Management (AMB) (Signed)
Chronic Care Management   Follow Up Note   10/28/2018 Name: [name]. MRN: [id number] DOB: 1945/05/11  Referred by: [name], DO Reason for referral : Chronic Care Management (Patient Phone Call) and Care Coordination   [name]. is a 73 y.o. year old male who is a primary care patient of [name], DO. The CCM team was consulted for assistance with chronic disease management and care coordination needs. [name] has a past medical history including but not limited to: hypertension, type 2 diabetes and hyperlipidemia  [name] recently admitted to [facility], positive for COVID-19 infection, from 10/1 to 10/7.  Receive coordination of care message from [place] as forwarded by [facility] Nurse Case Manager.   I reached out to Annia Mclaughlin. by phone today.   Review of patient status, including review of consultants reports, relevant laboratory and other test results, and collaboration with appropriate care team members and the patient's provider was performed as part of comprehensive patient evaluation and provision of chronic care management services.    Outpatient Encounter Medications as of 10/28/2018  Medication Sig Note  . ACCU-CHEK AVIVA PLUS test strip Check sugar up to 2 x daily   . Accu-Chek FastClix Lancets MISC Check sugar up to 2 x daily   . Accu-Chek Softclix Lancets lancets Check sugar up to 2 x daily   . acetaminophen (TYLENOL) 325 MG tablet Take 2 tablets (650 mg total) by mouth every 6 (six) hours as needed for mild pain or headache (fever >/= 101).   . benzonatate (TESSALON) 100 MG capsule Take 1 capsule (100 mg total) by mouth 3 (three) times daily as needed for cough.   . Blood Glucose Monitoring Suppl (ACCU-CHEK AVIVA PLUS) w/Device KIT Use to check blood sugar up to x 2 daily   . Dulaglutide (TRULICITY) 1.5 LG/9.2JJ SOPN Inject 1.5 mg into the skin once a week. 07/01/2018: On Tuesdays  . glipiZIDE  (GLUCOTROL) 5 MG tablet Take 0.5 tablets (2.5 mg total) by mouth daily before breakfast.   . lisinopril-hydrochlorothiazide (ZESTORETIC) 20-12.5 MG tablet Take 1 tablet by mouth daily.   . metFORMIN (GLUCOPHAGE) 1000 MG tablet TAKE 1 TABLET(1000 MG) BY MOUTH TWICE DAILY WITH A MEAL   . ondansetron (ZOFRAN) 4 MG tablet Take 1 tablet (4 mg total) by mouth every 8 (eight) hours as needed for nausea or vomiting.   . pravastatin (PRAVACHOL) 10 MG tablet TAKE 1 TABLET(10 MG) BY MOUTH DAILY   . Selenium Sulfide 2.25 % SHAM APPLY EXTERNALLY TO THE AFFECTED AREA DAILY AS NEEDED FOR IRRITATION   . tadalafil (ADCIRCA/CIALIS) 20 MG tablet Take 1 tablet (20 mg total) by mouth daily as needed for erectile dysfunction.    Facility-Administered Encounter Medications as of 10/28/2018  Medication  . 0.9 %  sodium chloride infusion  . acetaminophen (TYLENOL) tablet 650 mg  . [START ON 10/29/2018] dexamethasone (DECADRON) tablet 6 mg  . docusate sodium (COLACE) capsule 100 mg  . enoxaparin (LOVENOX) injection 40 mg  . insulin aspart (novoLOG) injection 0-20 Units  . insulin aspart (novoLOG) injection 0-5 Units  . insulin glargine (LANTUS) injection 5 Units  . Ipratropium-Albuterol (COMBIVENT) respimat 1 puff  . ondansetron (ZOFRAN) tablet 4 mg   Or  . ondansetron (ZOFRAN) injection 4 mg  . pravastatin (PRAVACHOL) tablet 10 mg  . sodium chloride flush (NS) 0.9 % injection 3 mL  . sodium chloride flush (NS) 0.9 % injection 3 mL  Goals Addressed            This Visit's Progress   . PharmD - "I want to get my A1C down" (pt-stated)       Current Barriers:  [name] Medication Adherence  . Financial Barriers . Knowledge deficits related to nutrition with diabetes  Pharmacist Clinical Goal(s):  [name] Over the next 30 days, patient will continue to work with PharmD to address needs related to address needs related to diabetes management.  . Over the next 30 days, patient will demonstrate maintained medication  adherence as evidenced by patient report of adherence  Interventions:   . Coordination of care call to [place] patient will be receiving [facility] nursing service through [place] . Follow up call to [name] o Patient reports that he has just reached home, driven by family o Reports that he will be staying home in quarantine until Friday, 10/30/18, as directed at discharge today. o Denies any needs today. Reports having his medications  . Advise patient that he should expect to be contacted by [facility] regarding [place] RN services by Friday o Provide patient with contact phone number for [facility]: [phone] . Will collarate with CM Care Team  Patient Self Care Activities:  . Self administers all medications as prescribed . To check blood sugars daily to twice daily and keep log. . To contact pharmacy to order refills of medication . Calls provider office for new concerns or questions. . Patient to eat regular meals  Please see past updates related to this goal by clicking on the "Past Updates" button in the selected goal         Plan  Telephone follow up appointment with care management team member scheduled for: 10/8  Harlow Asa, PharmD, [place] [phone]

## 2018-10-28 NOTE — Discharge Instructions (Signed)
You should remain in quarantine and YOU MAY NOT RETURN TO WORK or GO OUT IN PUBLIC UNTIL 10/30/2018, at which time you will no longer need to remain in quarantine.   COVID-19 COVID-19 is a respiratory infection that is caused by a virus called severe acute respiratory syndrome coronavirus 2 (SARS-CoV-2). The disease is also known as coronavirus disease or novel coronavirus. In some people, the virus may not cause any symptoms. In others, it may cause a serious infection. The infection can get worse quickly and can lead to complications, such as:  Pneumonia, or infection of the lungs.  Acute respiratory distress syndrome or ARDS. This is fluid build-up in the lungs.  Acute respiratory failure. This is a condition in which there is not enough oxygen passing from the lungs to the body.  Sepsis or septic shock. This is a serious bodily reaction to an infection.  Blood clotting problems.  Secondary infections due to bacteria or fungus. The virus that causes COVID-19 is contagious. This means that it can spread from person to person through droplets from coughs and sneezes (respiratory secretions). What are the causes? This illness is caused by a virus. You may catch the virus by:  Breathing in droplets from an infected person's cough or sneeze.  Touching something, like a table or a doorknob, that was exposed to the virus (contaminated) and then touching your mouth, nose, or eyes. What increases the risk? Risk for infection You are more likely to be infected with this virus if you:  Live in or travel to an area with a COVID-19 outbreak.  Come in contact with a sick person who recently traveled to an area with a COVID-19 outbreak.  Provide care for or live with a person who is infected with COVID-19. Risk for serious illness You are more likely to become seriously ill from the virus if you:  Are 75 years of age or older.  Have a long-term disease that lowers your body's ability to fight  infection (immunocompromised).  Live in a nursing home or long-term care facility.  Have a long-term (chronic) disease such as: ? Chronic lung disease, including chronic obstructive pulmonary disease or asthma ? Heart disease. ? Diabetes. ? Chronic kidney disease. ? Liver disease.  Are obese. What are the signs or symptoms? Symptoms of this condition can range from mild to severe. Symptoms may appear any time from 2 to 14 days after being exposed to the virus. They include:  A fever.  A cough.  Difficulty breathing.  Chills.  Muscle pains.  A sore throat.  Loss of taste or smell. Some people may also have stomach problems, such as nausea, vomiting, or diarrhea. Other people may not have any symptoms of COVID-19. How is this diagnosed? This condition may be diagnosed based on:  Your signs and symptoms, especially if: ? You live in an area with a COVID-19 outbreak. ? You recently traveled to or from an area where the virus is common. ? You provide care for or live with a person who was diagnosed with COVID-19.  A physical exam.  Lab tests, which may include: ? A nasal swab to take a sample of fluid from your nose. ? A throat swab to take a sample of fluid from your throat. ? A sample of mucus from your lungs (sputum). ? Blood tests.  Imaging tests, which may include, X-rays, CT scan, or ultrasound. How is this treated? At present, there is no medicine to treat COVID-19. Medicines that treat  other diseases are being used on a trial basis to see if they are effective against COVID-19. Your health care provider will talk with you about ways to treat your symptoms. For most people, the infection is mild and can be managed at home with rest, fluids, and over-the-counter medicines. Treatment for a serious infection usually takes places in a hospital intensive care unit (ICU). It may include one or more of the following treatments. These treatments are given until your  symptoms improve.  Receiving fluids and medicines through an IV.  Supplemental oxygen. Extra oxygen is given through a tube in the nose, a face mask, or a hood.  Positioning you to lie on your stomach (prone position). This makes it easier for oxygen to get into the lungs.  Continuous positive airway pressure (CPAP) or bi-level positive airway pressure (BPAP) machine. This treatment uses mild air pressure to keep the airways open. A tube that is connected to a motor delivers oxygen to the body.  Ventilator. This treatment moves air into and out of the lungs by using a tube that is placed in your windpipe.  Tracheostomy. This is a procedure to create a hole in the neck so that a breathing tube can be inserted.  Extracorporeal membrane oxygenation (ECMO). This procedure gives the lungs a chance to recover by taking over the functions of the heart and lungs. It supplies oxygen to the body and removes carbon dioxide. Follow these instructions at home: Lifestyle  If you are sick, stay home except to get medical care. Your health care provider will tell you how long to stay home. Call your health care provider before you go for medical care.  Rest at home as told by your health care provider.  Do not use any products that contain nicotine or tobacco, such as cigarettes, e-cigarettes, and chewing tobacco. If you need help quitting, ask your health care provider.  Return to your normal activities as told by your health care provider. Ask your health care provider what activities are safe for you. General instructions  Take over-the-counter and prescription medicines only as told by your health care provider.  Drink enough fluid to keep your urine pale yellow.  Keep all follow-up visits as told by your health care provider. This is important. How is this prevented?  There is no vaccine to help prevent COVID-19 infection. However, there are steps you can take to protect yourself and others  from this virus. To protect yourself:   Do not travel to areas where COVID-19 is a risk. The areas where COVID-19 is reported change often. To identify high-risk areas and travel restrictions, check the CDC travel website: FatFares.com.br  If you live in, or must travel to, an area where COVID-19 is a risk, take precautions to avoid infection. ? Stay away from people who are sick. ? Wash your hands often with soap and water for 20 seconds. If soap and water are not available, use an alcohol-based hand sanitizer. ? Avoid touching your mouth, face, eyes, or nose. ? Avoid going out in public, follow guidance from your state and local health authorities. ? If you must go out in public, wear a cloth face covering or face mask. ? Disinfect objects and surfaces that are frequently touched every day. This may include:  Counters and tables.  Doorknobs and light switches.  Sinks and faucets.  Electronics, such as phones, remote controls, keyboards, computers, and tablets. To protect others: If you have symptoms of COVID-19, take steps to  prevent the virus from spreading to others.  If you think you have a COVID-19 infection, contact your health care provider right away. Tell your health care team that you think you may have a COVID-19 infection.  Stay home. Leave your house only to seek medical care. Do not use public transport.  Do not travel while you are sick.  Wash your hands often with soap and water for 20 seconds. If soap and water are not available, use alcohol-based hand sanitizer.  Stay away from other members of your household. Let healthy household members care for children and pets, if possible. If you have to care for children or pets, wash your hands often and wear a mask. If possible, stay in your own room, separate from others. Use a different bathroom.  Make sure that all people in your household wash their hands well and often.  Cough or sneeze into a tissue  or your sleeve or elbow. Do not cough or sneeze into your hand or into the air.  Wear a cloth face covering or face mask. Where to find more information  Centers for Disease Control and Prevention: StickerEmporium.tn  World Health Organization: https://thompson-craig.com/ Contact a health care provider if:  You live in or have traveled to an area where COVID-19 is a risk and you have symptoms of the infection.  You have had contact with someone who has COVID-19 and you have symptoms of the infection. Get help right away if:  You have trouble breathing.  You have pain or pressure in your chest.  You have confusion.  You have bluish lips and fingernails.  You have difficulty waking from sleep.  You have symptoms that get worse. These symptoms may represent a serious problem that is an emergency. Do not wait to see if the symptoms will go away. Get medical help right away. Call your local emergency services (911 in the [place] Do not drive yourself to the hospital. Let the emergency medical personnel know if you think you have COVID-19. Summary  COVID-19 is a respiratory infection that is caused by a virus. It is also known as coronavirus disease or novel coronavirus. It can cause serious infections, such as pneumonia, acute respiratory distress syndrome, acute respiratory failure, or sepsis.  The virus that causes COVID-19 is contagious. This means that it can spread from person to person through droplets from coughs and sneezes.  You are more likely to develop a serious illness if you are 51 years of age or older, have a weak immunity, live in a nursing home, or have chronic disease.  There is no medicine to treat COVID-19. Your health care provider will talk with you about ways to treat your symptoms.  Take steps to protect yourself and others from infection. Wash your hands often and disinfect objects and surfaces that are frequently touched  every day. Stay away from people who are sick and wear a mask if you are sick. This information is not intended to replace advice given to you by your health care provider. Make sure you discuss any questions you have with your health care provider. Document Released: 02/12/2018 Document Revised: 06/04/2018 Document Reviewed: 02/12/2018 Elsevier Patient Education  2020 [employer].   COVID-19 Frequently Asked Questions COVID-19 (coronavirus disease) is an infection that is caused by a large family of viruses. Some viruses cause illness in people and others cause illness in animals like camels, cats, and bats. In some cases, the viruses that cause illness in animals can spread  to humans. Where did the coronavirus come from? In December 2019, Armeniahina told the [employer] Health [employer] [facility] [place](WHO) of several cases of lung disease (human respiratory illness). These cases were linked to [place]. The link to the seafood and livestock market suggests that the virus may have spread from animals to humans. However, since that first outbreak in December, the virus has also been shown to spread from person to person. What is the name of the disease and the virus? Disease name Early on, this disease was called novel coronavirus. This is because scientists determined that the disease was caused by a new (novel) respiratory virus. The World Health Organization [facility](WHO) has now named the disease COVID-19, or coronavirus disease. Virus name The virus that causes the disease is called severe acute respiratory syndrome coronavirus 2 (SARS-CoV-2). More information on disease and virus naming World Health Organization [facility](WHO): www.who.int/emergencies/diseases/novel-coronavirus-2019/technical-guidance/naming-the-coronavirus-disease-(covid-2019)-and-the-virus-that-causes-it Who is at risk for complications from coronavirus disease? Some people may be at higher risk for  complications from coronavirus disease. This includes older adults and people who have chronic diseases, such as heart disease, diabetes, and lung disease. If you are at higher risk for complications, take these extra precautions:  Avoid close contact with people who are sick or have a fever or cough. Stay at least 3-6 ft (1-2 m) away from them, if possible.  Wash your hands often with soap and water for at least 20 seconds.  Avoid touching your face, mouth, nose, or eyes.  Keep supplies on hand at home, such as food, medicine, and cleaning supplies.  Stay home as much as possible.  Avoid social gatherings and travel. How does coronavirus disease spread? The virus that causes coronavirus disease spreads easily from person to person (is contagious). There are also cases of community-spread disease. This means the disease has spread to:  People who have no known contact with other infected people.  People who have not traveled to areas where there are known cases. It appears to spread from one person to another through droplets from coughing or sneezing. Can I get the virus from touching surfaces or objects? There is still a lot that we do not know about the virus that causes coronavirus disease. Scientists are basing a lot of information on what they know about similar viruses, such as:  Viruses cannot generally survive on surfaces for long. They need a human body (host) to survive.  It is more likely that the virus is spread by close contact with people who are sick (direct contact), such as through: ? Shaking hands or hugging. ? Breathing in respiratory droplets that travel through the air. This can happen when an infected person coughs or sneezes on or near other people.  It is less likely that the virus is spread when a person touches a surface or object that has the virus on it (indirect contact). The virus may be able to enter the body if the person touches a surface or object and  then touches his or her face, eyes, nose, or mouth. Can a person spread the virus without having symptoms of the disease? It may be possible for the virus to spread before a person has symptoms of the disease, but this is most likely not the main way the virus is spreading. It is more likely for the virus to spread by being in close contact with people who are sick and breathing in the respiratory droplets of a sick person's  cough or sneeze. What are the symptoms of coronavirus disease? Symptoms vary from person to person and can range from mild to severe. Symptoms may include:  Fever.  Cough.  Tiredness, weakness, or fatigue.  Fast breathing or feeling short of breath. These symptoms can appear anywhere from 2 to 14 days after you have been exposed to the virus. If you develop symptoms, call your health care provider. People with severe symptoms may need hospital care. If I am exposed to the virus, how long does it take before symptoms start? Symptoms of coronavirus disease may appear anywhere from 2 to 14 days after a person has been exposed to the virus. If you develop symptoms, call your health care provider. Should I be tested for this virus? Your health care provider will decide whether to test you based on your symptoms, history of exposure, and your risk factors. How does a health care provider test for this virus? Health care providers will collect samples to send for testing. Samples may include:  Taking a swab of fluid from the nose.  Taking fluid from the lungs by having you cough up mucus (sputum) into a sterile cup.  Taking a blood sample.  Taking a stool or urine sample. Is there a treatment or vaccine for this virus? Currently, there is no vaccine to prevent coronavirus disease. Also, there are no medicines like antibiotics or antivirals to treat the virus. A person who becomes sick is given supportive care, which means rest and fluids. A person may also relieve his or  her symptoms by using over-the-counter medicines that treat sneezing, coughing, and runny nose. These are the same medicines that a person takes for the common cold. If you develop symptoms, call your health care provider. People with severe symptoms may need hospital care. What can I do to protect myself and my family from this virus?     You can protect yourself and your family by taking the same actions that you would take to prevent the spread of other viruses. Take the following actions:  Wash your hands often with soap and water for at least 20 seconds. If soap and water are not available, use alcohol-based hand sanitizer.  Avoid touching your face, mouth, nose, or eyes.  Cough or sneeze into a tissue, sleeve, or elbow. Do not cough or sneeze into your hand or the air. ? If you cough or sneeze into a tissue, throw it away immediately and wash your hands.  Disinfect objects and surfaces that you frequently touch every day.  Avoid close contact with people who are sick or have a fever or cough. Stay at least 3-6 ft (1-2 m) away from them, if possible.  Stay home if you are sick, except to get medical care. Call your health care provider before you get medical care.  Make sure your vaccines are up to date. Ask your health care provider what vaccines you need. What should I do if I need to travel? Follow travel recommendations from your local health authority, the CDC, and WHO. Travel information and advice  Centers for Disease Control and Prevention (CDC): GeminiCard.gl  World Health Organization [facility]): PreviewDomains.se Know the risks and take action to protect your health  You are at higher risk of getting coronavirus disease if you are traveling to areas with an outbreak or if you are exposed to travelers from areas with an outbreak.  Wash your hands often and practice good hygiene to  lower the risk of catching or  spreading the virus. What should I do if I am sick? General instructions to stop the spread of infection  Wash your hands often with soap and water for at least 20 seconds. If soap and water are not available, use alcohol-based hand sanitizer.  Cough or sneeze into a tissue, sleeve, or elbow. Do not cough or sneeze into your hand or the air.  If you cough or sneeze into a tissue, throw it away immediately and wash your hands.  Stay home unless you must get medical care. Call your health care provider or local health authority before you get medical care.  Avoid public areas. Do not take public transportation, if possible.  If you can, wear a mask if you must go out of the house or if you are in close contact with someone who is not sick. Keep your home clean  Disinfect objects and surfaces that are frequently touched every day. This may include: ? Counters and tables. ? Doorknobs and light switches. ? Sinks and faucets. ? Electronics such as phones, remote controls, keyboards, computers, and tablets.  Wash dishes in hot, soapy water or use a dishwasher. Air-dry your dishes.  Wash laundry in hot water. Prevent infecting other household members  Let healthy household members care for children and pets, if possible. If you have to care for children or pets, wash your hands often and wear a mask.  Sleep in a different bedroom or bed, if possible.  Do not share personal items, such as razors, toothbrushes, deodorant, combs, brushes, towels, and washcloths. Where to find more information Centers for Disease Control and Prevention (CDC)  Information and news updates: https://www.butler-gonzalez.com/ World Health Organization [facility])  Information and news updates: MissExecutive.com.ee  Coronavirus health topic: https://www.castaneda.info/  Questions and answers on COVID-19:  OpportunityDebt.at  Global tracker: who.sprinklr.com American Academy of Pediatrics (AAP)  Information for families: www.healthychildren.org/English/health-issues/conditions/chest-lungs/Pages/2019-Novel-Coronavirus.aspx The coronavirus situation is changing rapidly. Check your local health authority website or the CDC and [facility] websites for updates and news. When should I contact a health care provider?  Contact your health care provider if you have symptoms of an infection, such as fever or cough, and you: ? Have been near anyone who is known to have coronavirus disease. ? Have come into contact with a person who is suspected to have coronavirus disease. ? Have traveled outside of the country. When should I get emergency medical care?  Get help right away by calling your local emergency services (911 in the [place] if you have: ? Trouble breathing. ? Pain or pressure in your chest. ? Confusion. ? Blue-tinged lips and fingernails. ? Difficulty waking from sleep. ? Symptoms that get worse. Let the emergency medical personnel know if you think you have coronavirus disease. Summary  A new respiratory virus is spreading from person to person and causing COVID-19 (coronavirus disease).  The virus that causes COVID-19 appears to spread easily. It spreads from one person to another through droplets from coughing or sneezing.  Older adults and those with chronic diseases are at higher risk of disease. If you are at higher risk for complications, take extra precautions.  There is currently no vaccine to prevent coronavirus disease. There are no medicines, such as antibiotics or antivirals, to treat the virus.  You can protect yourself and your family by washing your hands often, avoiding touching your face, and covering your coughs and sneezes. This information is not intended to replace advice given to you by your health care provider. Make sure you  discuss any  questions you have with your health care provider. Document Released: 05/05/2018 Document Revised: 05/05/2018 Document Reviewed: 05/05/2018 Elsevier Patient Education  2020 Elsevier Inc.  COVID-19: How to Protect Yourself and Others Know how it spreads  There is currently no vaccine to prevent coronavirus disease 2019 (COVID-19).  The best way to prevent illness is to avoid being exposed to this virus.  The virus is thought to spread mainly from person-to-person. ? Between people who are in close contact with one another (within about 6 feet). ? Through respiratory droplets produced when an infected person coughs, sneezes or talks. ? These droplets can land in the mouths or noses of people who are nearby or possibly be inhaled into the lungs. ? Some recent studies have suggested that COVID-19 may be spread by people who are not showing symptoms. Everyone should Clean your hands often  Wash your hands often with soap and water for at least 20 seconds especially after you have been in a public place, or after blowing your nose, coughing, or sneezing.  If soap and water are not readily available, use a hand sanitizer that contains at least 60% alcohol. Cover all surfaces of your hands and rub them together until they feel dry.  Avoid touching your eyes, nose, and mouth with unwashed hands. Avoid close contact  Stay home if you are sick.  Avoid close contact with people who are sick.  Put distance between yourself and other people. ? Remember that some people without symptoms may be able to spread virus. ? This is especially important for people who are at higher risk of getting very RetroStamps.it Cover your mouth and nose with a cloth face cover when around others  You could spread COVID-19 to others even if you do not feel sick.  Everyone should wear a cloth face cover when they have to go out in public,  for example to the grocery store or to pick up other necessities. ? Cloth face coverings should not be placed on young children under age 61, anyone who has trouble breathing, or is unconscious, incapacitated or otherwise unable to remove the mask without assistance.  The cloth face cover is meant to protect other people in case you are infected.  Do NOT use a facemask meant for a Research scientist (physical sciences).  Continue to keep about 6 feet between yourself and others. The cloth face cover is not a substitute for social distancing. Cover coughs and sneezes  If you are in a private setting and do not have on your cloth face covering, remember to always cover your mouth and nose with a tissue when you cough or sneeze or use the inside of your elbow.  Throw used tissues in the trash.  Immediately wash your hands with soap and water for at least 20 seconds. If soap and water are not readily available, clean your hands with a hand sanitizer that contains at least 60% alcohol. Clean and disinfect  Clean AND disinfect frequently touched surfaces daily. This includes tables, doorknobs, light switches, countertops, handles, desks, phones, keyboards, toilets, faucets, and sinks. [email or web address]  If surfaces are dirty, clean them: Use detergent or soap and water prior to disinfection.  Then, use a household disinfectant. You can see a list of EPA-registered household disinfectants here. [email or web address] 05/26/2018 This information is not intended to replace advice given to you by your health care provider. Make sure you discuss any questions you have with your health care provider. Document Released: 05/05/2018 Document Revised: 06/03/2018  Document Reviewed: 05/05/2018 Elsevier Patient Education  [employer].

## 2018-10-29 ENCOUNTER — Telehealth: Payer: Self-pay

## 2018-10-29 ENCOUNTER — Ambulatory Visit: Payer: Self-pay | Admitting: Pharmacist

## 2018-10-29 NOTE — Telephone Encounter (Signed)
Transition Care Management Follow-up Telephone Call  Date of discharge and from where: [facility] 10/28/2018  How have you been since you were released from the hospital? "im still improving"  Any questions or concerns? No   Items Reviewed:  Did the pt receive and understand the discharge instructions provided? No   Medications obtained and verified? Yes   Any new allergies since your discharge? No   Dietary orders reviewed? Yes  Do you have support at home? Yes   Functional Questionnaire: (I = Independent and D = Dependent) ADLs: i  Bathing/Dressing- i  Meal Prep- i  Eating- i  Maintaining continence- i  Transferring/Ambulation- i  Managing Meds- i  Follow up appointments reviewed:   PCP Hospital f/u appt confirmed? Yes  Scheduled to see Lissa Merlin on 11/02/2018 @ 4:00pm.  [facility] f/u appt confirmed? n/a  Are transportation arrangements needed? No   If their condition worsens, is the pt aware to call PCP or go to the Emergency Dept.? Yes  Was the patient provided with contact information for the PCP's office or ED? Yes  Was to pt encouraged to call back with questions or concerns? Yes

## 2018-10-29 NOTE — Patient Instructions (Signed)
Thank you allowing the Chronic Care Management Team to be a part of your care! It was a pleasure speaking with you today!     CCM (Chronic Care Management) Team    Janci Minor RN, BSN Nurse Care Coordinator  [phone]   Harlow Asa PharmD  Clinical Pharmacist  [phone]   Eula Fried LCSW Clinical Social Worker [phone]  Visit Information  Goals Addressed            This Visit's Progress   . PharmD - "I want to get my A1C down" (pt-stated)       Current Barriers:  [name] Medication Adherence  . Financial Barriers . Knowledge deficits related to nutrition with diabetes  Pharmacist Clinical Goal(s):  [name] Over the next 30 days, patient will continue to work with PharmD to address needs related to address needs related to diabetes management.  . Over the next 30 days, patient will demonstrate maintained medication adherence as evidenced by patient report of adherence  Interventions:   . Note patient has post-discharge follow up visit scheduled with Cassell Smiles, covering for PCP, on 10/12 . Follow up regardng medication adherence o Mr. Innes reports he has a week of medication filled into his pillbox.  . Encourage patient to resume checking CBGs as before . Denies any medication questions/needs today.   Patient Self Care Activities:  . Self administers all medications as prescribed . To check blood sugars daily to twice daily and keep log. . To contact pharmacy to order refills of medication . Calls provider office for new concerns or questions. . Patient to eat regular meals  Please see past updates related to this goal by clicking on the "Past Updates" button in the selected goal         The patient verbalized understanding of instructions provided today and declined a print copy of patient instruction materials.   The care management team will reach out to the patient again over the next 7 days.   Harlow Asa, PharmD, [place] [employer] [phone]

## 2018-10-29 NOTE — Chronic Care Management (AMB) (Signed)
Chronic Care Management   Follow Up Note   10/29/2018 Name: Denim Kalmbach. MRN: [id number] DOB: 12/07/1945  Referred by: [name], DO Reason for referral : Chronic Care Management (Patient Phone Call)   Ayyan Sites. is a 73 y.o. year old male who is a primary care patient of [name], DO. The CCM team was consulted for assistance with chronic disease management and care coordination needs. Mr. Bolander has a past medical history including but not limited to: hypertension, type 2 diabetes and hyperlipidemia  I reached out to [name]. by phone today. Patient reports that he is currently resting, just woke from a nap.  Review of patient status, including review of consultants reports, relevant laboratory and other test results, and collaboration with appropriate care team members and the patient's provider was performed as part of comprehensive patient evaluation and provision of chronic care management services.     Outpatient Encounter Medications as of 10/29/2018  Medication Sig Note  . ACCU-CHEK AVIVA PLUS test strip Check sugar up to 2 x daily   . Accu-Chek FastClix Lancets MISC Check sugar up to 2 x daily   . Accu-Chek Softclix Lancets lancets Check sugar up to 2 x daily   . acetaminophen (TYLENOL) 325 MG tablet Take 2 tablets (650 mg total) by mouth every 6 (six) hours as needed for mild pain or headache (fever >/= 101).   . benzonatate (TESSALON) 100 MG capsule Take 1 capsule (100 mg total) by mouth 3 (three) times daily as needed for cough.   . Blood Glucose Monitoring Suppl (ACCU-CHEK AVIVA PLUS) w/Device KIT Use to check blood sugar up to x 2 daily   . Dulaglutide (TRULICITY) 1.5 XY/5.8PF SOPN Inject 1.5 mg into the skin once a week. 07/01/2018: On Tuesdays  . glipiZIDE (GLUCOTROL) 5 MG tablet Take 0.5 tablets (2.5 mg total) by mouth daily before breakfast.   . lisinopril-hydrochlorothiazide (ZESTORETIC) 20-12.5 MG tablet Take 1 tablet by  mouth daily.   . metFORMIN (GLUCOPHAGE) 1000 MG tablet TAKE 1 TABLET(1000 MG) BY MOUTH TWICE DAILY WITH A MEAL   . ondansetron (ZOFRAN) 4 MG tablet Take 1 tablet (4 mg total) by mouth every 8 (eight) hours as needed for nausea or vomiting.   . pravastatin (PRAVACHOL) 10 MG tablet TAKE 1 TABLET(10 MG) BY MOUTH DAILY   . Selenium Sulfide 2.25 % SHAM APPLY EXTERNALLY TO THE AFFECTED AREA DAILY AS NEEDED FOR IRRITATION   . tadalafil (ADCIRCA/CIALIS) 20 MG tablet Take 1 tablet (20 mg total) by mouth daily as needed for erectile dysfunction.    No facility-administered encounter medications on file as of 10/29/2018.     Goals Addressed            This Visit's Progress   . PharmD - "I want to get my A1C down" (pt-stated)       Current Barriers:  [name] Medication Adherence  . Financial Barriers . Knowledge deficits related to nutrition with diabetes  Pharmacist Clinical Goal(s):  [name] Over the next 30 days, patient will continue to work with PharmD to address needs related to address needs related to diabetes management.  . Over the next 30 days, patient will demonstrate maintained medication adherence as evidenced by patient report of adherence  Interventions:   . Note patient has post-discharge follow up visit scheduled with Cassell Smiles, covering for PCP, on 10/12 . Follow up regardng medication adherence o Mr. Ellsworth reports he has a week of medication filled into  his pillbox.  . Encourage patient to resume checking CBGs as before . Denies any medication questions/needs today.   Patient Self Care Activities:  . Self administers all medications as prescribed . To check blood sugars daily to twice daily and keep log. . To contact pharmacy to order refills of medication . Calls provider office for new concerns or questions. . Patient to eat regular meals  Please see past updates related to this goal by clicking on the "Past Updates" button in the selected goal         Plan  The care  management team will reach out to the patient again over the next 7 days.   Harlow Asa, PharmD, [place] [employer] [phone]

## 2018-10-30 DIAGNOSIS — J9601 Acute respiratory failure with hypoxia: Secondary | ICD-10-CM | POA: Diagnosis not present

## 2018-10-30 DIAGNOSIS — E871 Hypo-osmolality and hyponatremia: Secondary | ICD-10-CM | POA: Diagnosis not present

## 2018-10-30 DIAGNOSIS — E119 Type 2 diabetes mellitus without complications: Secondary | ICD-10-CM | POA: Diagnosis not present

## 2018-10-30 DIAGNOSIS — Z9181 History of falling: Secondary | ICD-10-CM | POA: Diagnosis not present

## 2018-10-30 DIAGNOSIS — Z7984 Long term (current) use of oral hypoglycemic drugs: Secondary | ICD-10-CM | POA: Diagnosis not present

## 2018-10-30 DIAGNOSIS — I10 Essential (primary) hypertension: Secondary | ICD-10-CM | POA: Diagnosis not present

## 2018-10-30 DIAGNOSIS — E785 Hyperlipidemia, unspecified: Secondary | ICD-10-CM | POA: Diagnosis not present

## 2018-10-30 DIAGNOSIS — J1281 Pneumonia due to SARS-associated coronavirus: Secondary | ICD-10-CM | POA: Diagnosis not present

## 2018-10-30 DIAGNOSIS — Z79899 Other long term (current) drug therapy: Secondary | ICD-10-CM | POA: Diagnosis not present

## 2018-11-02 ENCOUNTER — Inpatient Hospital Stay: Payer: Medicare HMO | Admitting: Nurse Practitioner

## 2018-11-02 ENCOUNTER — Ambulatory Visit (INDEPENDENT_AMBULATORY_CARE_PROVIDER_SITE_OTHER): Payer: Medicare HMO | Admitting: Nurse Practitioner

## 2018-11-02 ENCOUNTER — Encounter: Payer: Self-pay | Admitting: Nurse Practitioner

## 2018-11-02 ENCOUNTER — Other Ambulatory Visit: Payer: Self-pay

## 2018-11-02 VITALS — Temp 97.6°F

## 2018-11-02 DIAGNOSIS — J1289 Other viral pneumonia: Secondary | ICD-10-CM | POA: Diagnosis not present

## 2018-11-02 DIAGNOSIS — Z09 Encounter for follow-up examination after completed treatment for conditions other than malignant neoplasm: Secondary | ICD-10-CM | POA: Diagnosis not present

## 2018-11-02 DIAGNOSIS — U071 COVID-19: Secondary | ICD-10-CM | POA: Diagnosis not present

## 2018-11-02 NOTE — Progress Notes (Signed)
Subjective:    Patient ID: Christopher Friendly., male    DOB: 06-15-1945, 73 y.o.   MRN: [id number]  Christopher Keil. is a 73 y.o. male presenting on 11/02/2018 for Hospitalization Follow-up (Covid-19 positive x 11 days ago, denies SOB, coughing, fever and bodyaches or any symptoms related to Covid-19. )  Telemedicine Encounter: Disclosed to patient at start of encounter that we will provide appropriate telemedicine services.  Patient consents to be treated via phone prior to discussion. - Patient is at his home and is accessed via telephone. - Services are provided by Cassell Smiles from [facility].  Limitations of remote visits are discussed.    HPI HOSPITAL FOLLOW-UP VISIT  Hospital/Location: [facility]) Date of Admission: 10/22/2018 Date of Discharge: 10/28/2018 Transitions of care telephone call: 10/29/2018  Reason for Admission: COVID-19 infection Primary (+Secondary) Diagnosis: Acute hypoxic respiratory failure (DM2, HTN, HLD, hyponatremia)  FOLLOW-UP - Hospital H&P and Discharge Summary have been reviewed - Patient presents today 11 days after recent hospitalization. Brief summary of recent course, patient had symptoms of progressive fatigue, severe generalized weakness, nonproductive cough, and poor oral intake.  He was found to be febrile and hypoxic with diffuse opacities on chest x-ray and was admitted to [facility].  While there he was treated with remdesivir and Decadron and remained hospitalized for 6 days. - New medications on discharge: None - Changes to current meds on discharge: None  Things are going okay.  Patient plans for tomorrow to be last day in quarantine - Breathing okay.  Eating okay.  No cough.  Energy levels getting back to normal.   - Patient notes he went to the store today.  Patient wears mask if he goes out  Hypoxia - Patient has no way to check oxygen levels at home.  Patient denies headache and confusion.   Patient lives by himself, so no feedback.  CBG control: CBGs yesterday and today were 130 and 131.  Patient reports no CBGs over 160 since being home. Healthy CBGs were around 110-120s.  Patient is eating chicken, fish today.    Social History   Tobacco Use  . Smoking status: Current Some Day Smoker    Types: Cigarettes  . Smokeless tobacco: Current User  . Tobacco comment: pack of cigarettes last 1-2 months - doesnt inhale  Substance Use Topics  . Alcohol use: Yes    Alcohol/week: 1.0 standard drinks    Types: 1 Cans of beer per week    Comment: 1-2 beers week   . Drug use: No    Review of Systems Per HPI unless specifically indicated above  Outpatient Encounter Medications as of 11/02/2018  Medication Sig Note  . Accu-Chek FastClix Lancets MISC Check sugar up to 2 x daily   . acetaminophen (TYLENOL) 325 MG tablet Take 2 tablets (650 mg total) by mouth every 6 (six) hours as needed for mild pain or headache (fever >/= 101).   . Blood Glucose Monitoring Suppl (ACCU-CHEK AVIVA PLUS) w/Device KIT Use to check blood sugar up to x 2 daily   . Dulaglutide (TRULICITY) 1.5 XV/4.0GQ SOPN Inject 1.5 mg into the skin once a week. 07/01/2018: On Tuesdays  . glipiZIDE (GLUCOTROL) 5 MG tablet Take 0.5 tablets (2.5 mg total) by mouth daily before breakfast.   . lisinopril-hydrochlorothiazide (ZESTORETIC) 20-12.5 MG tablet Take 1 tablet by mouth daily.   . metFORMIN (GLUCOPHAGE) 1000 MG tablet TAKE 1 TABLET(1000 MG) BY MOUTH TWICE DAILY WITH  A MEAL   . pravastatin (PRAVACHOL) 10 MG tablet TAKE 1 TABLET(10 MG) BY MOUTH DAILY   . tadalafil (ADCIRCA/CIALIS) 20 MG tablet Take 1 tablet (20 mg total) by mouth daily as needed for erectile dysfunction.   [name] ACCU-CHEK AVIVA PLUS test strip Check sugar up to 2 x daily (Patient not taking: Reported on 11/02/2018)   . Accu-Chek Softclix Lancets lancets Check sugar up to 2 x daily   . benzonatate (TESSALON) 100 MG capsule Take 1 capsule (100 mg total) by mouth  3 (three) times daily as needed for cough. (Patient not taking: Reported on 11/02/2018)   . ondansetron (ZOFRAN) 4 MG tablet Take 1 tablet (4 mg total) by mouth every 8 (eight) hours as needed for nausea or vomiting. (Patient not taking: Reported on 11/02/2018)   . Selenium Sulfide 2.25 % SHAM APPLY EXTERNALLY TO THE AFFECTED AREA DAILY AS NEEDED FOR IRRITATION (Patient not taking: Reported on 11/02/2018)    No facility-administered encounter medications on file as of 11/02/2018.       Objective:    Temp 97.6 F (36.4 C) (Oral)   Wt Readings from Last 3 Encounters:  10/22/18 184 lb 11.9 oz (83.8 kg)  10/22/18 185 lb (83.9 kg)  09/02/18 191 lb (86.6 kg)    Physical Exam  Patient remotely monitored.  Normal speech without increased work of breathing.  Cognition normal.  Results for orders placed or performed during the hospital encounter of 10/22/18  CBC with Differential/Platelet  Result Value Ref Range   WBC 5.1 4.0 - 10.5 K/uL   RBC 4.33 4.22 - 5.81 MIL/uL   Hemoglobin 13.1 13.0 - 17.0 g/dL   HCT 39.3 39.0 - 52.0 %   MCV 90.8 80.0 - 100.0 fL   MCH 30.3 26.0 - 34.0 pg   MCHC 33.3 30.0 - 36.0 g/dL   RDW 11.4 (L) 11.5 - 15.5 %   Platelets 269 150 - 400 K/uL   nRBC 0.0 0.0 - 0.2 %   Neutrophils Relative % 89 %   Neutro Abs 4.6 1.7 - 7.7 K/uL   Lymphocytes Relative 8 %   Lymphs Abs 0.4 (L) 0.7 - 4.0 K/uL   Monocytes Relative 2 %   Monocytes Absolute 0.1 0.1 - 1.0 K/uL   Eosinophils Relative 0 %   Eosinophils Absolute 0.0 0.0 - 0.5 K/uL   Basophils Relative 0 %   Basophils Absolute 0.0 0.0 - 0.1 K/uL   WBC Morphology MILD LEFT SHIFT (1-5% METAS, OCC MYELO, OCC BANDS)    Immature Granulocytes 1 %   Abs Immature Granulocytes 0.05 0.00 - 0.07 K/uL  Comprehensive metabolic panel  Result Value Ref Range   Sodium 132 (L) 135 - 145 mmol/L   Potassium 4.0 3.5 - 5.1 mmol/L   Chloride 96 (L) 98 - 111 mmol/L   CO2 24 22 - 32 mmol/L   Glucose, Bld 285 (H) 70 - 99 mg/dL   BUN 30 (H)  8 - 23 mg/dL   Creatinine, Ser 0.94 0.61 - 1.24 mg/dL   Calcium 8.2 (L) 8.9 - 10.3 mg/dL   Total Protein 6.9 6.5 - 8.1 g/dL   Albumin 2.7 (L) 3.5 - 5.0 g/dL   AST 28 15 - 41 U/L   ALT 21 0 - 44 U/L   Alkaline Phosphatase 52 38 - 126 U/L   Total Bilirubin 0.7 0.3 - 1.2 mg/dL   GFR calc non Af Amer >60 >60 mL/min   GFR calc Af Amer >60 >60 mL/min  Anion gap 12 5 - 15  C-reactive protein  Result Value Ref Range   CRP 23.0 (H) <1.0 mg/dL  D-dimer, quantitative (not at [facility])  Result Value Ref Range   D-Dimer, Quant 1.86 (H) 0.00 - 0.50 ug/mL-FEU  Ferritin  Result Value Ref Range   Ferritin 774 (H) 24 - 336 ng/mL  Hemoglobin A1c  Result Value Ref Range   Hgb A1c MFr Bld 6.3 (H) 4.8 - 5.6 %   Mean Plasma Glucose 134.11 mg/dL  TSH  Result Value Ref Range   TSH 0.621 0.350 - 4.500 uIU/mL  T4, free  Result Value Ref Range   Free T4 1.18 (H) 0.61 - 1.12 ng/dL  Glucose, capillary  Result Value Ref Range   Glucose-Capillary 242 (H) 70 - 99 mg/dL  Glucose, capillary  Result Value Ref Range   Glucose-Capillary 246 (H) 70 - 99 mg/dL  Glucose, capillary  Result Value Ref Range   Glucose-Capillary 245 (H) 70 - 99 mg/dL  CBC with Differential/Platelet  Result Value Ref Range   WBC 7.0 4.0 - 10.5 K/uL   RBC 4.50 4.22 - 5.81 MIL/uL   Hemoglobin 13.6 13.0 - 17.0 g/dL   HCT 40.9 39.0 - 52.0 %   MCV 90.9 80.0 - 100.0 fL   MCH 30.2 26.0 - 34.0 pg   MCHC 33.3 30.0 - 36.0 g/dL   RDW 11.4 (L) 11.5 - 15.5 %   Platelets 315 150 - 400 K/uL   nRBC 0.0 0.0 - 0.2 %   Neutrophils Relative % 87 %   Neutro Abs 6.1 1.7 - 7.7 K/uL   Lymphocytes Relative 7 %   Lymphs Abs 0.5 (L) 0.7 - 4.0 K/uL   Monocytes Relative 5 %   Monocytes Absolute 0.4 0.1 - 1.0 K/uL   Eosinophils Relative 0 %   Eosinophils Absolute 0.0 0.0 - 0.5 K/uL   Basophils Relative 0 %   Basophils Absolute 0.0 0.0 - 0.1 K/uL   Immature Granulocytes 1 %   Abs Immature Granulocytes 0.07 0.00 - 0.07 K/uL  Comprehensive metabolic  panel  Result Value Ref Range   Sodium 134 (L) 135 - 145 mmol/L   Potassium 4.5 3.5 - 5.1 mmol/L   Chloride 99 98 - 111 mmol/L   CO2 25 22 - 32 mmol/L   Glucose, Bld 200 (H) 70 - 99 mg/dL   BUN 34 (H) 8 - 23 mg/dL   Creatinine, Ser 0.95 0.61 - 1.24 mg/dL   Calcium 8.4 (L) 8.9 - 10.3 mg/dL   Total Protein 6.6 6.5 - 8.1 g/dL   Albumin 2.7 (L) 3.5 - 5.0 g/dL   AST 27 15 - 41 U/L   ALT 24 0 - 44 U/L   Alkaline Phosphatase 50 38 - 126 U/L   Total Bilirubin 0.4 0.3 - 1.2 mg/dL   GFR calc non Af Amer >60 >60 mL/min   GFR calc Af Amer >60 >60 mL/min   Anion gap 10 5 - 15  C-reactive protein  Result Value Ref Range   CRP 12.2 (H) <1.0 mg/dL  D-dimer, quantitative (not at [facility])  Result Value Ref Range   D-Dimer, Quant 1.49 (H) 0.00 - 0.50 ug/mL-FEU  Ferritin  Result Value Ref Range   Ferritin 714 (H) 24 - 336 ng/mL  Glucose, capillary  Result Value Ref Range   Glucose-Capillary 223 (H) 70 - 99 mg/dL  Glucose, capillary  Result Value Ref Range   Glucose-Capillary 227 (H) 70 - 99 mg/dL  Glucose, capillary  Result Value Ref Range   Glucose-Capillary 140 (H) 70 - 99 mg/dL  CBC with Differential/Platelet  Result Value Ref Range   WBC 5.7 4.0 - 10.5 K/uL   RBC 4.50 4.22 - 5.81 MIL/uL   Hemoglobin 13.6 13.0 - 17.0 g/dL   HCT 40.8 39.0 - 52.0 %   MCV 90.7 80.0 - 100.0 fL   MCH 30.2 26.0 - 34.0 pg   MCHC 33.3 30.0 - 36.0 g/dL   RDW 11.4 (L) 11.5 - 15.5 %   Platelets 300 150 - 400 K/uL   nRBC 0.0 0.0 - 0.2 %   Neutrophils Relative % 80 %   Neutro Abs 4.6 1.7 - 7.7 K/uL   Lymphocytes Relative 13 %   Lymphs Abs 0.7 0.7 - 4.0 K/uL   Monocytes Relative 7 %   Monocytes Absolute 0.4 0.1 - 1.0 K/uL   Eosinophils Relative 0 %   Eosinophils Absolute 0.0 0.0 - 0.5 K/uL   Basophils Relative 0 %   Basophils Absolute 0.0 0.0 - 0.1 K/uL   Immature Granulocytes 0 %   Abs Immature Granulocytes 0.02 0.00 - 0.07 K/uL  Comprehensive metabolic panel  Result Value Ref Range   Sodium 138 135 -  145 mmol/L   Potassium 4.8 3.5 - 5.1 mmol/L   Chloride 103 98 - 111 mmol/L   CO2 26 22 - 32 mmol/L   Glucose, Bld 142 (H) 70 - 99 mg/dL   BUN 25 (H) 8 - 23 mg/dL   Creatinine, Ser 0.89 0.61 - 1.24 mg/dL   Calcium 8.5 (L) 8.9 - 10.3 mg/dL   Total Protein 6.5 6.5 - 8.1 g/dL   Albumin 2.7 (L) 3.5 - 5.0 g/dL   AST 29 15 - 41 U/L   ALT 24 0 - 44 U/L   Alkaline Phosphatase 51 38 - 126 U/L   Total Bilirubin 0.5 0.3 - 1.2 mg/dL   GFR calc non Af Amer >60 >60 mL/min   GFR calc Af Amer >60 >60 mL/min   Anion gap 9 5 - 15  C-reactive protein  Result Value Ref Range   CRP 7.5 (H) <1.0 mg/dL  D-dimer, quantitative (not at [facility])  Result Value Ref Range   D-Dimer, Quant 1.22 (H) 0.00 - 0.50 ug/mL-FEU  Ferritin  Result Value Ref Range   Ferritin 602 (H) 24 - 336 ng/mL  Glucose, capillary  Result Value Ref Range   Glucose-Capillary 187 (H) 70 - 99 mg/dL  Glucose, capillary  Result Value Ref Range   Glucose-Capillary 166 (H) 70 - 99 mg/dL  Glucose, capillary  Result Value Ref Range   Glucose-Capillary 197 (H) 70 - 99 mg/dL  Comprehensive metabolic panel  Result Value Ref Range   Sodium 136 135 - 145 mmol/L   Potassium 4.1 3.5 - 5.1 mmol/L   Chloride 103 98 - 111 mmol/L   CO2 23 22 - 32 mmol/L   Glucose, Bld 143 (H) 70 - 99 mg/dL   BUN 17 8 - 23 mg/dL   Creatinine, Ser 0.84 0.61 - 1.24 mg/dL   Calcium 8.3 (L) 8.9 - 10.3 mg/dL   Total Protein 6.4 (L) 6.5 - 8.1 g/dL   Albumin 2.7 (L) 3.5 - 5.0 g/dL   AST 38 15 - 41 U/L   ALT 36 0 - 44 U/L   Alkaline Phosphatase 52 38 - 126 U/L   Total Bilirubin 0.9 0.3 - 1.2 mg/dL   GFR calc non Af Amer >60 >60 mL/min   GFR calc  Af Amer >60 >60 mL/min   Anion gap 10 5 - 15  C-reactive protein  Result Value Ref Range   CRP 13.5 (H) <1.0 mg/dL  Glucose, capillary  Result Value Ref Range   Glucose-Capillary 192 (H) 70 - 99 mg/dL  Glucose, capillary  Result Value Ref Range   Glucose-Capillary 175 (H) 70 - 99 mg/dL  Glucose, capillary  Result  Value Ref Range   Glucose-Capillary 176 (H) 70 - 99 mg/dL  Glucose, capillary  Result Value Ref Range   Glucose-Capillary 224 (H) 70 - 99 mg/dL  Glucose, capillary  Result Value Ref Range   Glucose-Capillary 179 (H) 70 - 99 mg/dL  Comprehensive metabolic panel  Result Value Ref Range   Sodium 135 135 - 145 mmol/L   Potassium 4.3 3.5 - 5.1 mmol/L   Chloride 103 98 - 111 mmol/L   CO2 23 22 - 32 mmol/L   Glucose, Bld 128 (H) 70 - 99 mg/dL   BUN 14 8 - 23 mg/dL   Creatinine, Ser 0.71 0.61 - 1.24 mg/dL   Calcium 8.0 (L) 8.9 - 10.3 mg/dL   Total Protein 5.6 (L) 6.5 - 8.1 g/dL   Albumin 2.4 (L) 3.5 - 5.0 g/dL   AST 27 15 - 41 U/L   ALT 31 0 - 44 U/L   Alkaline Phosphatase 49 38 - 126 U/L   Total Bilirubin 0.6 0.3 - 1.2 mg/dL   GFR calc non Af Amer >60 >60 mL/min   GFR calc Af Amer >60 >60 mL/min   Anion gap 9 5 - 15  C-reactive protein  Result Value Ref Range   CRP 10.6 (H) <1.0 mg/dL  Glucose, capillary  Result Value Ref Range   Glucose-Capillary 115 (H) 70 - 99 mg/dL  Glucose, capillary  Result Value Ref Range   Glucose-Capillary 191 (H) 70 - 99 mg/dL  Glucose, capillary  Result Value Ref Range   Glucose-Capillary 144 (H) 70 - 99 mg/dL  Glucose, capillary  Result Value Ref Range   Glucose-Capillary 291 (H) 70 - 99 mg/dL  Glucose, capillary  Result Value Ref Range   Glucose-Capillary 242 (H) 70 - 99 mg/dL  Glucose, capillary  Result Value Ref Range   Glucose-Capillary 224 (H) 70 - 99 mg/dL  Glucose, capillary  Result Value Ref Range   Glucose-Capillary 204 (H) 70 - 99 mg/dL  Glucose, capillary  Result Value Ref Range   Glucose-Capillary 185 (H) 70 - 99 mg/dL  Glucose, capillary  Result Value Ref Range   Glucose-Capillary 271 (H) 70 - 99 mg/dL  ABO/Rh  Result Value Ref Range   ABO/RH(D)      O POS Performed at [facility], [place] [address], [place], [place] [address]       Assessment & Plan:   Problem List Items Addressed This Visit       Respiratory   Pneumonia due to COVID-19 virus - Primary    Other Visit Diagnoses    Hospital discharge follow-up         Stable, improving symptoms after discharge for Covid 19 infection.  Hypoxia not able to be evaluated today. No current concern for hypoxia complications.  Plan: 1. Patient ok to end quarantine tomorrow.  Fatigue present so discussed return to work in 1 week.  Letter written for patient in case is needed. 2. Continue medications for symptom control. 3. Continue regular physical activity as tolerated. 4. Follow-up 1-2 weeks prn.  - Time spent in direct consultation with  patient via telemedicine about above concerns: 16 minutes 16 minutes telephone encounter  I have reviewed the admission H&P, hospital notes, discharge summary, discharge medication list, and have reconciled the current and discharge medications today.  Follow up plan: Follow-up 1-2 weeks prn worsening symptoms.  Cassell Smiles, DNP, AGPCNP-BC Adult Gerontology Primary Care Nurse Practitioner [facility] 11/02/2018, 2:28 PM

## 2018-11-03 DIAGNOSIS — I10 Essential (primary) hypertension: Secondary | ICD-10-CM | POA: Diagnosis not present

## 2018-11-03 DIAGNOSIS — J1281 Pneumonia due to SARS-associated coronavirus: Secondary | ICD-10-CM | POA: Diagnosis not present

## 2018-11-03 DIAGNOSIS — E785 Hyperlipidemia, unspecified: Secondary | ICD-10-CM | POA: Diagnosis not present

## 2018-11-03 DIAGNOSIS — J9601 Acute respiratory failure with hypoxia: Secondary | ICD-10-CM | POA: Diagnosis not present

## 2018-11-03 DIAGNOSIS — Z79899 Other long term (current) drug therapy: Secondary | ICD-10-CM | POA: Diagnosis not present

## 2018-11-03 DIAGNOSIS — E871 Hypo-osmolality and hyponatremia: Secondary | ICD-10-CM | POA: Diagnosis not present

## 2018-11-03 DIAGNOSIS — Z9181 History of falling: Secondary | ICD-10-CM | POA: Diagnosis not present

## 2018-11-03 DIAGNOSIS — Z7984 Long term (current) use of oral hypoglycemic drugs: Secondary | ICD-10-CM | POA: Diagnosis not present

## 2018-11-03 DIAGNOSIS — E119 Type 2 diabetes mellitus without complications: Secondary | ICD-10-CM | POA: Diagnosis not present

## 2018-11-05 ENCOUNTER — Ambulatory Visit: Payer: Self-pay | Admitting: *Deleted

## 2018-11-05 ENCOUNTER — Telehealth: Payer: Self-pay

## 2018-11-05 ENCOUNTER — Ambulatory Visit: Payer: Self-pay | Admitting: Pharmacist

## 2018-11-05 DIAGNOSIS — U071 COVID-19: Secondary | ICD-10-CM

## 2018-11-05 NOTE — Chronic Care Management (AMB) (Signed)
  Chronic Care Management   Follow Up Note   11/05/2018 Name: [name]. MRN: [id number] DOB: Nov 21, 1945  Referred by: [name], DO Reason for referral : Chronic Care Management (Patient Phone Call)   [name]. is a 73 y.o. year old male who is a primary care patient of [name], DO. The CCM team was consulted for assistance with chronic disease management and care coordination needs.    Was unable to reach patient via telephone today and have left HIPAA compliant voicemail asking patient to return my call.   Plan  The care management team will reach out to the patient again over the next 7 days.   Harlow Asa, PharmD, [place] [employer] [phone]

## 2018-11-05 NOTE — Chronic Care Management (AMB) (Signed)
Chronic Care Management   Follow Up Note   11/05/2018 Name: [name]. MRN: [id number] DOB: 1945-11-04  Referred by: [name], DO Reason for referral : Chronic Care Management (Post Covid F/U)   [name]. is a 73 y.o. year old male who is a primary care patient of [name], DO. The CCM team was consulted for assistance with chronic disease management and care coordination needs.    Review of patient status, including review of consultants reports, relevant laboratory and other test results, and collaboration with appropriate care team members and the patient's provider was performed as part of comprehensive patient evaluation and provision of chronic care management services.    SDOH (Social Determinants of Health) screening performed today: Food Insecurity  Physical Activity. See Care Plan for related entries.   Advanced Directives Status: N See Care Plan and Vynca application for related entries.  Outpatient Encounter Medications as of 11/05/2018  Medication Sig Note  . ACCU-CHEK AVIVA PLUS test strip Check sugar up to 2 x daily (Patient not taking: Reported on 11/02/2018)   . Accu-Chek FastClix Lancets MISC Check sugar up to 2 x daily   . Accu-Chek Softclix Lancets lancets Check sugar up to 2 x daily   . acetaminophen (TYLENOL) 325 MG tablet Take 2 tablets (650 mg total) by mouth every 6 (six) hours as needed for mild pain or headache (fever >/= 101).   . benzonatate (TESSALON) 100 MG capsule Take 1 capsule (100 mg total) by mouth 3 (three) times daily as needed for cough. (Patient not taking: Reported on 11/02/2018)   . Blood Glucose Monitoring Suppl (ACCU-CHEK AVIVA PLUS) w/Device KIT Use to check blood sugar up to x 2 daily   . Dulaglutide (TRULICITY) 1.5 ZS/8.2LM SOPN Inject 1.5 mg into the skin once a week. 07/01/2018: On Tuesdays  . glipiZIDE (GLUCOTROL) 5 MG tablet Take 0.5 tablets (2.5 mg total) by mouth daily before breakfast.   .  lisinopril-hydrochlorothiazide (ZESTORETIC) 20-12.5 MG tablet Take 1 tablet by mouth daily.   . metFORMIN (GLUCOPHAGE) 1000 MG tablet TAKE 1 TABLET(1000 MG) BY MOUTH TWICE DAILY WITH A MEAL   . ondansetron (ZOFRAN) 4 MG tablet Take 1 tablet (4 mg total) by mouth every 8 (eight) hours as needed for nausea or vomiting. (Patient not taking: Reported on 11/02/2018)   . pravastatin (PRAVACHOL) 10 MG tablet TAKE 1 TABLET(10 MG) BY MOUTH DAILY   . Selenium Sulfide 2.25 % SHAM APPLY EXTERNALLY TO THE AFFECTED AREA DAILY AS NEEDED FOR IRRITATION (Patient not taking: Reported on 11/02/2018)   . tadalafil (ADCIRCA/CIALIS) 20 MG tablet Take 1 tablet (20 mg total) by mouth daily as needed for erectile dysfunction.    No facility-administered encounter medications on file as of 11/05/2018.      Goals Addressed            This Visit's Progress   . I am still so tired from covid (pt-stated)       Current Barriers:  [name] Knowledge Deficits related to COVID-19 and impact on patient self health management  Clinical Goal(s):  [name] Over the next 30 days, patient will verbalize basic understanding of COVID-19 impact on individual health and self health management as evidenced by verbalization of basic understanding of COVID-19 as a viral disease, measures to prevent exposure, signs and symptoms, when to contact provider  Interventions: . Collaborated with PCP regarding pt questions surrounding returning to work and his continued lethargy . Discussed plans with patient for  ongoing care management follow up and provided patient with direct contact information for care management team . Patient relayed he was hospitalized with Covid-19. He stated prior to admission he felt dizzy and actually fell a couple of times. He stated this was concerning to him related to he lives alone. Discussed Med-Alert and fall prevention. Patient stated he continues to feel weak and tired although improving.  . Patient to contact me once he  receives papers from his HR department and we will make a plan from there to get him scheduled with PCP and complete necessary paperwork.  . Patient stating [place] plans to visit him tomorrow.  . Talked with patient about his health plan's program for meal delivery after hospital admit. Patient requested I set this up for him. . Placed a call to Well Dine Meal service through the patient's [employer]. Meals Rep [place] set the meals up to be delivered on next Wednesday 10/21. She stated they would contact the patient.   Patient Self Care Activities:  . Self administers medications as prescribed . Attends all scheduled provider appointments  Initial goal documentation                       The care management team will reach out to the patient again over the next 30 days.  The patient has been provided with contact information for the care management team and has been advised to call with any health related questions or concerns.    Merlene Morse Tila Millirons RN, BSN Nurse Case Pharmacist, [facility]  [phone]) Business Mobile

## 2018-11-05 NOTE — Patient Instructions (Signed)
Thank you allowing the Chronic Care Management Team to be a part of your care! It was a pleasure speaking with you today!  CCM (Chronic Care Management) Team   Selma Rodelo RN, BSN Nurse Care Coordinator  [phone]  Harlow Asa PharmD  Clinical Pharmacist  [phone]  Eula Fried LCSW Clinical Social Worker [phone]  Goals Addressed            This Visit's Progress   . I am still so tired from covid (pt-stated)       Current Barriers:  [name] Knowledge Deficits related to COVID-19 and impact on patient self health management  Clinical Goal(s):  [name] Over the next 30 days, patient will verbalize basic understanding of COVID-19 impact on individual health and self health management as evidenced by verbalization of basic understanding of COVID-19 as a viral disease, measures to prevent exposure, signs and symptoms, when to contact provider  Interventions: . Collaborated with PCP regarding pt questions surrounding returning to work and his continued lethargy . Discussed plans with patient for ongoing care management follow up and provided patient with direct contact information for care management team . Patient relayed he was hospitalized with Covid-19. He stated prior to admission he felt dizzy and actually fell a couple of times. He stated this was concerning to him related to he lives alone. Discussed Med-Alert and fall prevention. Patient stated he continues to feel weak and tired although improving.  . Patient to contact me once he receives papers from his HR department and we will make a plan from there to get him scheduled with PCP and complete necessary paperwork.  . Patient stating [place] plans to visit him tomorrow.  . Talked with patient about his health plan's program for meal delivery after hospital admit. Patient requested I set this up for him. . Placed a call to [employer] Meal service through the patient's [employer]. Meals Rep [place] set the meals up to be  delivered on next Wednesday 10/21. She stated they would contact the patient.   Patient Self Care Activities:  . Self administers medications as prescribed . Attends all scheduled provider appointments  Initial goal documentation                      The patient verbalized understanding of instructions provided today and declined a print copy of patient instruction materials.   The patient has been provided with contact information for the care management team and has been advised to call with any health related questions or concerns.    For information about COVID-19 or "Corona Virus", the following web resources may be helpful:  CDC: BeginnerSteps.be    [place]:  [email or web address]

## 2018-11-06 DIAGNOSIS — E785 Hyperlipidemia, unspecified: Secondary | ICD-10-CM | POA: Diagnosis not present

## 2018-11-06 DIAGNOSIS — E119 Type 2 diabetes mellitus without complications: Secondary | ICD-10-CM | POA: Diagnosis not present

## 2018-11-06 DIAGNOSIS — E871 Hypo-osmolality and hyponatremia: Secondary | ICD-10-CM | POA: Diagnosis not present

## 2018-11-06 DIAGNOSIS — Z79899 Other long term (current) drug therapy: Secondary | ICD-10-CM | POA: Diagnosis not present

## 2018-11-06 DIAGNOSIS — J9601 Acute respiratory failure with hypoxia: Secondary | ICD-10-CM | POA: Diagnosis not present

## 2018-11-06 DIAGNOSIS — Z9181 History of falling: Secondary | ICD-10-CM | POA: Diagnosis not present

## 2018-11-06 DIAGNOSIS — Z7984 Long term (current) use of oral hypoglycemic drugs: Secondary | ICD-10-CM | POA: Diagnosis not present

## 2018-11-06 DIAGNOSIS — J1281 Pneumonia due to SARS-associated coronavirus: Secondary | ICD-10-CM | POA: Diagnosis not present

## 2018-11-06 DIAGNOSIS — I10 Essential (primary) hypertension: Secondary | ICD-10-CM | POA: Diagnosis not present

## 2018-11-09 ENCOUNTER — Ambulatory Visit: Payer: Self-pay | Admitting: *Deleted

## 2018-11-09 ENCOUNTER — Ambulatory Visit (INDEPENDENT_AMBULATORY_CARE_PROVIDER_SITE_OTHER): Payer: Medicare HMO | Admitting: Pharmacist

## 2018-11-09 DIAGNOSIS — I10 Essential (primary) hypertension: Secondary | ICD-10-CM | POA: Diagnosis not present

## 2018-11-09 DIAGNOSIS — U071 COVID-19: Secondary | ICD-10-CM

## 2018-11-09 DIAGNOSIS — E113293 Type 2 diabetes mellitus with mild nonproliferative diabetic retinopathy without macular edema, bilateral: Secondary | ICD-10-CM | POA: Diagnosis not present

## 2018-11-09 NOTE — Chronic Care Management (AMB) (Signed)
Chronic Care Management   Follow Up Note   11/09/2018 Name: [name]. MRN: [id number] DOB: May 27, 1945  Referred by: [name], DO Reason for referral : Chronic Care Management (Fatigure lingering from [place], FMLA papers )   [name]. is a 73 y.o. year old male who is a primary care patient of [name], DO. The CCM team was consulted for assistance with chronic disease management and care coordination needs.    Review of patient status, including review of consultants reports, relevant laboratory and other test results, and collaboration with appropriate care team members and the patient's provider was performed as part of comprehensive patient evaluation and provision of chronic care management services.    SDOH (Social Determinants of Health) screening performed today: Financial Strain  Physical Activity. See Care Plan for related entries.   Advanced Directives Status: N See Care Plan and Vynca application for related entries.  Outpatient Encounter Medications as of 11/09/2018  Medication Sig Note  . ACCU-CHEK AVIVA PLUS test strip Check sugar up to 2 x daily (Patient not taking: Reported on 11/02/2018)   . Accu-Chek FastClix Lancets MISC Check sugar up to 2 x daily   . Accu-Chek Softclix Lancets lancets Check sugar up to 2 x daily   . acetaminophen (TYLENOL) 325 MG tablet Take 2 tablets (650 mg total) by mouth every 6 (six) hours as needed for mild pain or headache (fever >/= 101). (Patient not taking: Reported on 11/09/2018)   . benzonatate (TESSALON) 100 MG capsule Take 1 capsule (100 mg total) by mouth 3 (three) times daily as needed for cough. (Patient not taking: Reported on 11/02/2018)   . Blood Glucose Monitoring Suppl (ACCU-CHEK AVIVA PLUS) w/Device KIT Use to check blood sugar up to x 2 daily   . Dulaglutide (TRULICITY) 1.5 SW/1.0XN SOPN Inject 1.5 mg into the skin once a week. 07/01/2018: On Tuesdays  . glipiZIDE (GLUCOTROL) 5 MG  tablet Take 0.5 tablets (2.5 mg total) by mouth daily before breakfast.   . lisinopril-hydrochlorothiazide (ZESTORETIC) 20-12.5 MG tablet Take 1 tablet by mouth daily.   . metFORMIN (GLUCOPHAGE) 1000 MG tablet TAKE 1 TABLET(1000 MG) BY MOUTH TWICE DAILY WITH A MEAL   . ondansetron (ZOFRAN) 4 MG tablet Take 1 tablet (4 mg total) by mouth every 8 (eight) hours as needed for nausea or vomiting. (Patient not taking: Reported on 11/02/2018)   . pravastatin (PRAVACHOL) 10 MG tablet TAKE 1 TABLET(10 MG) BY MOUTH DAILY   . Selenium Sulfide 2.25 % SHAM APPLY EXTERNALLY TO THE AFFECTED AREA DAILY AS NEEDED FOR IRRITATION (Patient not taking: Reported on 11/02/2018)   . tadalafil (ADCIRCA/CIALIS) 20 MG tablet Take 1 tablet (20 mg total) by mouth daily as needed for erectile dysfunction. (Patient not taking: Reported on 11/09/2018)    No facility-administered encounter medications on file as of 11/09/2018.      Goals Addressed            This Visit's Progress   . I am still so tired from covid (pt-stated)       Current Barriers:  [name] Knowledge Deficits related to COVID-19 and impact on patient self health management  Clinical Goal(s):  [name] Over the next 30 days, patient will verbalize basic understanding of COVID-19 impact on individual health and self health management as evidenced by verbalization of basic understanding of COVID-19 as a viral disease, measures to prevent exposure, signs and symptoms, when to contact provider  Interventions: . Advised patient to contact  PCP office regarding FMLA papers received today. Attempted to go over papers but patient wanting to review papers with PCP.  [name] Discussed plans with patient for ongoing care management follow up and provided patient with direct contact information for care management team . Patient made aware of [employer] Well [employer] meals to be delivered on Wednesday 10/21 . Patient continues to note lethargy from recent hospital admission. Patient discussed  slowly getting strength back.  . Patient concerned about finances if he is unable to obtain his FMLA, but does not feel well enough to return to work.    Patient Self Care Activities:  . Self administers medications as prescribed . Attends all scheduled provider appointments  Please see past updates related to this goal by clicking on the "Past Updates" button in the selected goal                        The care management team will reach out to the patient again over the next 30 days.    Merlene Morse Arvo Ealy RN, BSN Nurse Case Pharmacist, [facility]  [phone]) Business [employer]

## 2018-11-09 NOTE — Chronic Care Management (AMB) (Signed)
Chronic Care Management   Follow Up Note   11/09/2018 Name: [name]. MRN: [id number] DOB: 1945/08/22  Referred by: [name], DO Reason for referral : Chronic Care Management (Patient Phone Call)   [name]. is a 73 y.o. year old male who is a primary care patient of [name], DO. The CCM team was consulted for assistance with chronic disease management and care coordination needs. [name] has a past medical history including but not limited to: hypertension, type 2 diabetes and hyperlipidemia  I reached out to [name]. by phone today.   Review of patient status, including review of consultants reports, relevant laboratory and other test results, and collaboration with appropriate care team members and the patient's provider was performed as part of comprehensive patient evaluation and provision of chronic care management services.     Outpatient Encounter Medications as of 11/09/2018  Medication Sig Note  . Dulaglutide (TRULICITY) 1.5 JS/9.7WY SOPN Inject 1.5 mg into the skin once a week. 07/01/2018: On Tuesdays  . glipiZIDE (GLUCOTROL) 5 MG tablet Take 0.5 tablets (2.5 mg total) by mouth daily before breakfast.   . lisinopril-hydrochlorothiazide (ZESTORETIC) 20-12.5 MG tablet Take 1 tablet by mouth daily.   . metFORMIN (GLUCOPHAGE) 1000 MG tablet TAKE 1 TABLET(1000 MG) BY MOUTH TWICE DAILY WITH A MEAL   . pravastatin (PRAVACHOL) 10 MG tablet TAKE 1 TABLET(10 MG) BY MOUTH DAILY   . ACCU-CHEK AVIVA PLUS test strip Check sugar up to 2 x daily (Patient not taking: Reported on 11/02/2018)   . Accu-Chek FastClix Lancets MISC Check sugar up to 2 x daily   . Accu-Chek Softclix Lancets lancets Check sugar up to 2 x daily   . acetaminophen (TYLENOL) 325 MG tablet Take 2 tablets (650 mg total) by mouth every 6 (six) hours as needed for mild pain or headache (fever >/= 101). (Patient not taking: Reported on 11/09/2018)   . benzonatate  (TESSALON) 100 MG capsule Take 1 capsule (100 mg total) by mouth 3 (three) times daily as needed for cough. (Patient not taking: Reported on 11/02/2018)   . Blood Glucose Monitoring Suppl (ACCU-CHEK AVIVA PLUS) w/Device KIT Use to check blood sugar up to x 2 daily   . ondansetron (ZOFRAN) 4 MG tablet Take 1 tablet (4 mg total) by mouth every 8 (eight) hours as needed for nausea or vomiting. (Patient not taking: Reported on 11/02/2018)   . Selenium Sulfide 2.25 % SHAM APPLY EXTERNALLY TO THE AFFECTED AREA DAILY AS NEEDED FOR IRRITATION (Patient not taking: Reported on 11/02/2018)   . tadalafil (ADCIRCA/CIALIS) 20 MG tablet Take 1 tablet (20 mg total) by mouth daily as needed for erectile dysfunction. (Patient not taking: Reported on 11/09/2018)    No facility-administered encounter medications on file as of 11/09/2018.     Goals Addressed            This Visit's Progress   . PharmD - "I want to get my A1C down" (pt-stated)       Current Barriers:  [name] Medication Adherence  . Financial Barriers . Knowledge deficits related to nutrition with diabetes  Pharmacist Clinical Goal(s):  [name] Over the next 30 days, patient will continue to work with PharmD to address needs related to address needs related to diabetes management.  . Over the next 30 days, patient will demonstrate maintained medication adherence as evidenced by patient report of adherence  Interventions:   . Patient was recently discharged from hospital and all medications  have been reviewed. (Admitted 10/22/2018 - 10/28/2018 with COVID-19) o At discharge, patient to START: - Acetaminophen 325 mg - 2 tablets (650 mg total) every 6 hours prn mild pain or headache (fever >/= 101). - Benzonatate 100 mg - 1 capsule three times daily as needed for cough - Ondansetron 4 mg - 1 tablet every 8 hours as needed for nausea or vomiting o [name] denies currently needing acetaminophen, benzonatate or ondansetron. Reports that his symptoms have  improved, although states that he is still more tired than usual . Counsel on importance of blood sugar control and monitoring o [name] reports taking: - Metformin 1000 mg twice daily - Trulicity 1.5 mg weekly (last taken today) . Unable to remember if he took his dose last week - Glipizide - reports self- increased his dose - took 5 mg QAM yesterday and today - Review recent blood sugar results (see below) o Advise [name] to resume taking glipizide 2.5 mg QAM as prescribed.  o Discuss factors that may have recently increased his blood sugar. o Counsel patient to keep blood sugar log o Counsel on importance of eating regular and balanced meals as well as limiting carbohydrate portion sizes . Counsel on importance of medication adherence . [name] discusses his concerns about returning to work before he feels like he has his strength back o Reports he called his work HR today to follow up as he had not yet receieve the [facility] paperwork that he had requested. o Confirms that he will follow up with CM Nurse Case Manager once he has these papers  Patient Self Care Activities:  . Self administers all medications as prescribed o Using weekly pillbox as adherence tool . To check blood sugars daily to twice daily and keep log. Date Fasting Blood Glucose After Supper Bedtime Notes  15 - October   212   16 - October 140     17 - October 109 288*    18 - October 175   Took glipizide 5 mg   19 - October 131   Took glipizide 5 mg   . To contact pharmacy to order refills of medication . Calls provider office for new concerns or questions. . Patient to eat regular meals  Please see past updates related to this goal by clicking on the "Past Updates" button in the selected goal         Plan  Telephone follow up appointment with care management team member scheduled for: 10/29 at 3 pm  Harlow Asa, PharmD, [place] [phone]

## 2018-11-09 NOTE — Patient Instructions (Signed)
Thank you allowing the Chronic Care Management Team to be a part of your care! It was a pleasure speaking with you today!     CCM (Chronic Care Management) Team    Janci Minor RN, BSN Nurse Care Coordinator  [phone]   Harlow Asa PharmD  Clinical Pharmacist  [phone]   Eula Fried LCSW Clinical Social Worker [phone]  Visit Information  Goals Addressed            This Visit's Progress   . PharmD - "I want to get my A1C down" (pt-stated)       Current Barriers:  Christopher Kitchen Medication Adherence  . Financial Barriers . Knowledge deficits related to nutrition with diabetes  Pharmacist Clinical Goal(s):  Christopher Kitchen Over the next 30 days, patient will continue to work with PharmD to address needs related to address needs related to diabetes management.  . Over the next 30 days, patient will demonstrate maintained medication adherence as evidenced by patient report of adherence  Interventions:   . Counsel on importance of blood sugar control and monitoring o [name] reports taking: - Metformin 1000 mg twice daily - Trulicity 1.5 mg weekly (last taken today) . Unable to remember if he took his dose last week - Glipizide - reports self- increased his dose - took 5 mg QAM yesterday and today - Review recent blood sugar results (see below) o Advise Mr. Cuff to resume taking glipizide 2.5 mg QAM as prescribed.  o Discuss factors that may have recently increased his blood sugar. o Counsel patient to keep blood sugar log o Counsel on importance of eating regular and balanced meals as well as limiting carbohydrate portion sizes . Counsel on importance of medication adherence . [name] discusses his concerns about returning to work before he feels like he has his strength back o Reports he called his work HR today to follow up as he had not yet receieve the [facility] paperwork that he had requested. o Confirms that he will follow up with CM Nurse Case Manager once he has these  papers  Patient Self Care Activities:  . Self administers all medications as prescribed o Using weekly pillbox as adherence tool . To check blood sugars daily to twice daily and keep log. Date Fasting Blood Glucose After Supper Bedtime Notes  15 - October   212   16 - October 140     17 - October 109 288*    18 - October 175   Took glipizide 5 mg   19 - October 131   Took glipizide 5 mg   . To contact pharmacy to order refills of medication . Calls provider office for new concerns or questions. . Patient to eat regular meals  Please see past updates related to this goal by clicking on the "Past Updates" button in the selected goal         The patient verbalized understanding of instructions provided today and declined a print copy of patient instruction materials.   Telephone follow up appointment with care management team member scheduled for: 10/29 at 3 pm  Harlow Asa, PharmD, [place] [phone]

## 2018-11-10 DIAGNOSIS — Z7984 Long term (current) use of oral hypoglycemic drugs: Secondary | ICD-10-CM | POA: Diagnosis not present

## 2018-11-10 DIAGNOSIS — Z79899 Other long term (current) drug therapy: Secondary | ICD-10-CM | POA: Diagnosis not present

## 2018-11-10 DIAGNOSIS — Z9181 History of falling: Secondary | ICD-10-CM | POA: Diagnosis not present

## 2018-11-10 DIAGNOSIS — J1281 Pneumonia due to SARS-associated coronavirus: Secondary | ICD-10-CM | POA: Diagnosis not present

## 2018-11-10 DIAGNOSIS — E119 Type 2 diabetes mellitus without complications: Secondary | ICD-10-CM | POA: Diagnosis not present

## 2018-11-10 DIAGNOSIS — J9601 Acute respiratory failure with hypoxia: Secondary | ICD-10-CM | POA: Diagnosis not present

## 2018-11-10 DIAGNOSIS — I10 Essential (primary) hypertension: Secondary | ICD-10-CM | POA: Diagnosis not present

## 2018-11-10 DIAGNOSIS — E871 Hypo-osmolality and hyponatremia: Secondary | ICD-10-CM | POA: Diagnosis not present

## 2018-11-10 DIAGNOSIS — E785 Hyperlipidemia, unspecified: Secondary | ICD-10-CM | POA: Diagnosis not present

## 2018-11-10 NOTE — Patient Instructions (Signed)
Thank you allowing the Chronic Care Management Team to be a part of your care! It was a pleasure speaking with you today!   CCM (Chronic Care Management) Team   Salvatrice Morandi RN, BSN Nurse Care Coordinator  [phone]  Harlow Asa PharmD  Clinical Pharmacist  [phone]  Eula Fried LCSW Clinical Social Worker [phone]  Goals Addressed            This Visit's Progress   . I am still so tired from covid (pt-stated)       Current Barriers:  [name] Knowledge Deficits related to COVID-19 and impact on patient self health management  Clinical Goal(s):  [name] Over the next 30 days, patient will verbalize basic understanding of COVID-19 impact on individual health and self health management as evidenced by verbalization of basic understanding of COVID-19 as a viral disease, measures to prevent exposure, signs and symptoms, when to contact provider  Interventions: . Advised patient to contact PCP office regarding FMLA papers received today. Attempted to go over papers but patient wanting to review papers with PCP.  [name] Discussed plans with patient for ongoing care management follow up and provided patient with direct contact information for care management team . Patient made aware of [employer] Well [employer] meals to be delivered on Wednesday 10/21 . Patient continues to note lethargy from recent hospital admission. Patient discussed slowly getting strength back.  . Patient concerned about finances if he is unable to obtain his FMLA, but does not feel well enough to return to work.    Patient Self Care Activities:  . Self administers medications as prescribed . Attends all scheduled provider appointments  Please see past updates related to this goal by clicking on the "Past Updates" button in the selected goal                       The patient verbalized understanding of instructions provided today and declined a print copy of patient instruction materials.   The  patient has been provided with contact information for the care management team and has been advised to call with any health related questions or concerns.

## 2018-11-11 ENCOUNTER — Ambulatory Visit: Payer: Self-pay | Admitting: *Deleted

## 2018-11-11 ENCOUNTER — Encounter: Payer: Self-pay | Admitting: Nurse Practitioner

## 2018-11-11 DIAGNOSIS — U071 COVID-19: Secondary | ICD-10-CM

## 2018-11-11 NOTE — Patient Instructions (Signed)
Thank you allowing the Chronic Care Management Team to be a part of your care! It was a pleasure speaking with you today!   CCM (Chronic Care Management) Team   Lisl Slingerland RN, BSN Nurse Care Coordinator  [phone]  Harlow Asa PharmD  Clinical Pharmacist  [phone]  Eula Fried LCSW Clinical Social Worker [phone]  Goals Addressed            This Visit's Progress   . I am still so tired from covid (pt-stated)       Current Barriers:  [name] Knowledge Deficits related to COVID-19 and impact on patient self health management  Clinical Goal(s):  [name] Over the next 30 days, patient will verbalize basic understanding of COVID-19 impact on individual health and self health management as evidenced by verbalization of basic understanding of COVID-19 as a viral disease, measures to prevent exposure, signs and symptoms, when to contact provider  Interventions: . Discussed plans with patient for ongoing care management follow up and provided patient with direct contact information for care management team . Patient made aware of [employer] Well Dine meals to be delivered on Wednesday 10/21 . Patient left me a voicemail requesting a return call.  . Called patient, Patient requesting help with filling out FMLA papers. Made a plan to meet patient tomorrow to call the help line together to assist patient  fill out the papers.    Patient Self Care Activities:  . Self administers medications as prescribed . Attends all scheduled provider appointments  Please see past updates related to this goal by clicking on the "Past Updates" button in the selected goal                       The patient verbalized understanding of instructions provided today and declined a print copy of patient instruction materials.   The patient has been provided with contact information for the care management team and has been advised to call with any health related questions or concerns.

## 2018-11-11 NOTE — Chronic Care Management (AMB) (Signed)
Chronic Care Management   Follow Up Note   11/11/2018 Name: [name]. MRN: [id number] DOB: 01-Mar-1945  Referred by: [name], DO Reason for referral : Chronic Care Management (Patient question about FMLA paperwork)   [name]. is a 73 y.o. year old male who is a primary care patient of [name], DO. The CCM team was consulted for assistance with chronic disease management and care coordination needs.    Review of patient status, including review of consultants reports, relevant laboratory and other test results, and collaboration with appropriate care team members and the patient's provider was performed as part of comprehensive patient evaluation and provision of chronic care management services.    SDOH (Social Determinants of Health) screening performed today: Financial Strain . See Care Plan for related entries.   Advanced Directives Status: N See Care Plan and Vynca application for related entries.  Outpatient Encounter Medications as of 11/11/2018  Medication Sig Note  . ACCU-CHEK AVIVA PLUS test strip Check sugar up to 2 x daily (Patient not taking: Reported on 11/02/2018)   . Accu-Chek FastClix Lancets MISC Check sugar up to 2 x daily   . Accu-Chek Softclix Lancets lancets Check sugar up to 2 x daily   . acetaminophen (TYLENOL) 325 MG tablet Take 2 tablets (650 mg total) by mouth every 6 (six) hours as needed for mild pain or headache (fever >/= 101). (Patient not taking: Reported on 11/09/2018)   . benzonatate (TESSALON) 100 MG capsule Take 1 capsule (100 mg total) by mouth 3 (three) times daily as needed for cough. (Patient not taking: Reported on 11/02/2018)   . Blood Glucose Monitoring Suppl (ACCU-CHEK AVIVA PLUS) w/Device KIT Use to check blood sugar up to x 2 daily   . Dulaglutide (TRULICITY) 1.5 IT/2.5QD SOPN Inject 1.5 mg into the skin once a week. 07/01/2018: On Tuesdays  . glipiZIDE (GLUCOTROL) 5 MG tablet Take 0.5 tablets  (2.5 mg total) by mouth daily before breakfast.   . lisinopril-hydrochlorothiazide (ZESTORETIC) 20-12.5 MG tablet Take 1 tablet by mouth daily.   . metFORMIN (GLUCOPHAGE) 1000 MG tablet TAKE 1 TABLET(1000 MG) BY MOUTH TWICE DAILY WITH A MEAL   . ondansetron (ZOFRAN) 4 MG tablet Take 1 tablet (4 mg total) by mouth every 8 (eight) hours as needed for nausea or vomiting. (Patient not taking: Reported on 11/02/2018)   . pravastatin (PRAVACHOL) 10 MG tablet TAKE 1 TABLET(10 MG) BY MOUTH DAILY   . Selenium Sulfide 2.25 % SHAM APPLY EXTERNALLY TO THE AFFECTED AREA DAILY AS NEEDED FOR IRRITATION (Patient not taking: Reported on 11/02/2018)   . tadalafil (ADCIRCA/CIALIS) 20 MG tablet Take 1 tablet (20 mg total) by mouth daily as needed for erectile dysfunction. (Patient not taking: Reported on 11/09/2018)    No facility-administered encounter medications on file as of 11/11/2018.      Goals Addressed            This Visit's Progress   . I am still so tired from covid (pt-stated)       Current Barriers:  [name] Knowledge Deficits related to COVID-19 and impact on patient self health management  Clinical Goal(s):  [name] Over the next 30 days, patient will verbalize basic understanding of COVID-19 impact on individual health and self health management as evidenced by verbalization of basic understanding of COVID-19 as a viral disease, measures to prevent exposure, signs and symptoms, when to contact provider  Interventions: . Discussed plans with patient for ongoing care management  follow up and provided patient with direct contact information for care management team . Patient made aware of [employer] Well Dine meals to be delivered on Wednesday 10/21 . Patient left me a voicemail requesting a return call.  . Called patient, Patient requesting help with filling out FMLA papers. Made a plan to meet patient tomorrow to call the help line together to assist patient  fill out the papers.    Patient Self Care  Activities:  . Self administers medications as prescribed . Attends all scheduled provider appointments  Please see past updates related to this goal by clicking on the "Past Updates" button in the selected goal                        The patient has been provided with contact information for the care management team and has been advised to call with any health related questions or concerns.    Merlene Morse Cai Flott RN, BSN Nurse Case Pharmacist, [facility]  [phone]) Business [employer]

## 2018-11-12 ENCOUNTER — Ambulatory Visit: Payer: Self-pay | Admitting: *Deleted

## 2018-11-12 DIAGNOSIS — U071 COVID-19: Secondary | ICD-10-CM

## 2018-11-12 NOTE — Chronic Care Management (AMB) (Signed)
Chronic Care Management   Follow Up Note   11/12/2018 Name: [name]. MRN: [id number] DOB: 18-May-1945  Referred by: [name], DO Reason for referral : Chronic Care Management (Covid-19) and Care Coordination (FMLA )   [name]. is a 73 y.o. year old male who is a primary care patient of [name], DO. The CCM team was consulted for assistance with chronic disease management and care coordination needs.    Review of patient status, including review of consultants reports, relevant laboratory and other test results, and collaboration with appropriate care team members and the patient's provider was performed as part of comprehensive patient evaluation and provision of chronic care management services.    SDOH (Social Determinants of Health) screening performed today: Financial Strain . See Care Plan for related entries.   Advanced Directives Status: N See Care Plan and Vynca application for related entries.  Outpatient Encounter Medications as of 11/12/2018  Medication Sig Note  . ACCU-CHEK AVIVA PLUS test strip Check sugar up to 2 x daily (Patient not taking: Reported on 11/02/2018)   . Accu-Chek FastClix Lancets MISC Check sugar up to 2 x daily   . Accu-Chek Softclix Lancets lancets Check sugar up to 2 x daily   . acetaminophen (TYLENOL) 325 MG tablet Take 2 tablets (650 mg total) by mouth every 6 (six) hours as needed for mild pain or headache (fever >/= 101). (Patient not taking: Reported on 11/09/2018)   . benzonatate (TESSALON) 100 MG capsule Take 1 capsule (100 mg total) by mouth 3 (three) times daily as needed for cough. (Patient not taking: Reported on 11/02/2018)   . Blood Glucose Monitoring Suppl (ACCU-CHEK AVIVA PLUS) w/Device KIT Use to check blood sugar up to x 2 daily   . Dulaglutide (TRULICITY) 1.5 EH/6.3JS SOPN Inject 1.5 mg into the skin once a week. 07/01/2018: On Tuesdays  . glipiZIDE (GLUCOTROL) 5 MG tablet Take 0.5 tablets  (2.5 mg total) by mouth daily before breakfast.   . lisinopril-hydrochlorothiazide (ZESTORETIC) 20-12.5 MG tablet Take 1 tablet by mouth daily.   . metFORMIN (GLUCOPHAGE) 1000 MG tablet TAKE 1 TABLET(1000 MG) BY MOUTH TWICE DAILY WITH A MEAL   . ondansetron (ZOFRAN) 4 MG tablet Take 1 tablet (4 mg total) by mouth every 8 (eight) hours as needed for nausea or vomiting. (Patient not taking: Reported on 11/02/2018)   . pravastatin (PRAVACHOL) 10 MG tablet TAKE 1 TABLET(10 MG) BY MOUTH DAILY   . Selenium Sulfide 2.25 % SHAM APPLY EXTERNALLY TO THE AFFECTED AREA DAILY AS NEEDED FOR IRRITATION (Patient not taking: Reported on 11/02/2018)   . tadalafil (ADCIRCA/CIALIS) 20 MG tablet Take 1 tablet (20 mg total) by mouth daily as needed for erectile dysfunction. (Patient not taking: Reported on 11/09/2018)    No facility-administered encounter medications on file as of 11/12/2018.      Goals Addressed            This Visit's Progress   . I am still so tired from covid (pt-stated)       Current Barriers:  [name] Knowledge Deficits related to COVID-19 and impact on patient self health management  Clinical Goal(s):  [name] Over the next 30 days, patient will verbalize basic understanding of COVID-19 impact on individual health and self health management as evidenced by verbalization of basic understanding of COVID-19 as a viral disease, measures to prevent exposure, signs and symptoms, when to contact provider  Interventions: . Advised patient to Call his HR dept  about paid days off related to COvid-19 abscence . Collaborated with PCP office  regarding papers that will be faxed which will need to be filled out and signed by the PCP . Discussed plans with patient for ongoing care management follow up and provided patient with direct contact information for care management team . Met with patient at the PCP office and assisted him with calling FMLA Source. [phone] . Leave request # [id number] . Spoke with  Abby. Patient requested leave from 10/22/2018- 11/23/2018 . Reached out to patient's benefit dept and patient was not signed up for short term Disability-[phone] . Patient confirmed he would be able to pay his bills until he would be able to return to work.  . Patient confirmed he did receive the Well Dine meals from [facility]  . Patient reported [facility] nurse continuing to come. He said he felt like he was gaining strength.  Patient Self Care Activities:  . Self administers medications as prescribed . Attends all scheduled provider appointments  Please see past updates related to this goal by clicking on the "Past Updates" button in the selected goal                        The care management team will reach out to the patient again over the next 30 days.  The patient has been provided with contact information for the care management team and has been advised to call with any health related questions or concerns.    Merlene Morse Alyona Romack RN, BSN Nurse Case Pharmacist, [facility]  [phone]) [employer]

## 2018-11-12 NOTE — Patient Instructions (Signed)
Thank you allowing the Chronic Care Management Team to be a part of your care! It was a pleasure speaking with you today!   CCM (Chronic Care Management) Team   Hallie Ishida RN, BSN Nurse Care Coordinator  [phone]  Harlow Asa PharmD  Clinical Pharmacist  [phone]  Eula Fried LCSW Clinical Social Worker [phone]  Goals Addressed            This Visit's Progress   . I am still so tired from covid (pt-stated)       Current Barriers:  [name] Knowledge Deficits related to COVID-19 and impact on patient self health management  Clinical Goal(s):  [name] Over the next 30 days, patient will verbalize basic understanding of COVID-19 impact on individual health and self health management as evidenced by verbalization of basic understanding of COVID-19 as a viral disease, measures to prevent exposure, signs and symptoms, when to contact provider  Interventions: . Advised patient to Call his HR dept about paid days off related to COvid-19 abscence . Collaborated with PCP office  regarding papers that will be faxed which will need to be filled out and signed by the PCP . Discussed plans with patient for ongoing care management follow up and provided patient with direct contact information for care management team . Met with patient at the PCP office and assisted him with calling FMLA Source. [phone] . Leave request # [id number] . Spoke with Abby. Patient requested leave from 10/22/2018- 11/23/2018 . Reached out to patient's benefit dept and patient was not signed up for short term Disability-[phone] . Patient confirmed he would be able to pay his bills until he would be able to return to work.  . Patient confirmed he did receive the Well Dine meals from [facility]  . Patient reported [facility] nurse continuing to come. He said he felt like he was gaining strength.  Patient Self Care Activities:  . Self administers medications as prescribed . Attends all scheduled provider  appointments  Please see past updates related to this goal by clicking on the "Past Updates" button in the selected goal                       The patient verbalized understanding of instructions provided today and declined a print copy of patient instruction materials.   The patient has been provided with contact information for the care management team and has been advised to call with any health related questions or concerns.

## 2018-11-13 DIAGNOSIS — J9601 Acute respiratory failure with hypoxia: Secondary | ICD-10-CM | POA: Diagnosis not present

## 2018-11-13 DIAGNOSIS — E119 Type 2 diabetes mellitus without complications: Secondary | ICD-10-CM | POA: Diagnosis not present

## 2018-11-13 DIAGNOSIS — Z9181 History of falling: Secondary | ICD-10-CM | POA: Diagnosis not present

## 2018-11-13 DIAGNOSIS — I10 Essential (primary) hypertension: Secondary | ICD-10-CM | POA: Diagnosis not present

## 2018-11-13 DIAGNOSIS — E871 Hypo-osmolality and hyponatremia: Secondary | ICD-10-CM | POA: Diagnosis not present

## 2018-11-13 DIAGNOSIS — Z79899 Other long term (current) drug therapy: Secondary | ICD-10-CM | POA: Diagnosis not present

## 2018-11-13 DIAGNOSIS — Z7984 Long term (current) use of oral hypoglycemic drugs: Secondary | ICD-10-CM | POA: Diagnosis not present

## 2018-11-13 DIAGNOSIS — J1281 Pneumonia due to SARS-associated coronavirus: Secondary | ICD-10-CM | POA: Diagnosis not present

## 2018-11-13 DIAGNOSIS — E785 Hyperlipidemia, unspecified: Secondary | ICD-10-CM | POA: Diagnosis not present

## 2018-11-16 ENCOUNTER — Telehealth: Payer: Self-pay

## 2018-11-17 ENCOUNTER — Other Ambulatory Visit: Payer: Self-pay

## 2018-11-17 ENCOUNTER — Ambulatory Visit (INDEPENDENT_AMBULATORY_CARE_PROVIDER_SITE_OTHER): Payer: Medicare HMO | Admitting: Family Medicine

## 2018-11-17 ENCOUNTER — Encounter: Payer: Self-pay | Admitting: Family Medicine

## 2018-11-17 VITALS — BP 136/77 | HR 81 | Temp 98.6°F

## 2018-11-17 DIAGNOSIS — J1281 Pneumonia due to SARS-associated coronavirus: Secondary | ICD-10-CM | POA: Diagnosis not present

## 2018-11-17 DIAGNOSIS — Z7984 Long term (current) use of oral hypoglycemic drugs: Secondary | ICD-10-CM | POA: Diagnosis not present

## 2018-11-17 DIAGNOSIS — U071 COVID-19: Secondary | ICD-10-CM | POA: Diagnosis not present

## 2018-11-17 DIAGNOSIS — Z9181 History of falling: Secondary | ICD-10-CM | POA: Diagnosis not present

## 2018-11-17 DIAGNOSIS — Z79899 Other long term (current) drug therapy: Secondary | ICD-10-CM | POA: Diagnosis not present

## 2018-11-17 DIAGNOSIS — E785 Hyperlipidemia, unspecified: Secondary | ICD-10-CM | POA: Diagnosis not present

## 2018-11-17 DIAGNOSIS — E871 Hypo-osmolality and hyponatremia: Secondary | ICD-10-CM | POA: Diagnosis not present

## 2018-11-17 DIAGNOSIS — J9601 Acute respiratory failure with hypoxia: Secondary | ICD-10-CM | POA: Diagnosis not present

## 2018-11-17 DIAGNOSIS — E119 Type 2 diabetes mellitus without complications: Secondary | ICD-10-CM | POA: Diagnosis not present

## 2018-11-17 DIAGNOSIS — I10 Essential (primary) hypertension: Secondary | ICD-10-CM | POA: Diagnosis not present

## 2018-11-17 NOTE — Progress Notes (Signed)
Virtual Visit via Telephone The purpose of this virtual visit is to provide medical care while limiting exposure to the novel coronavirus (COVID19) for both patient and office staff.  Consent was obtained for phone visit:  Yes.   Answered questions that patient had about telehealth interaction:  Yes.   I discussed the limitations, risks, security and privacy concerns of performing an evaluation and management service by telephone. I also discussed with the patient that there may be a patient responsible charge related to this service. The patient expressed understanding and agreed to proceed.  Patient Location: Home Provider Location: [facility])  ---------------------------------------------------------------------- Chief Complaint  Patient presents with  . COVID19    FMLA    S: Reviewed CMA documentation. I have called patient and gathered additional HPI as follows:  History of COVID19, secondary pneumonia complication - RESOLVED Reviewed recent course over past 1 month approx with hospitalization for [id number] w/ complicated pneumonia 35/05/9739 through 10/28/18, he was discharged and followed up with protocol, and has gradually improved. He has remained out of work since 10/22/2018. He is anticipated to return to work next week on 11/23/18. He has been cleared to return as bus driver. He reports breathing has improved, energy improved, he is improving towards his baseline and feels ready to return by next week.   Denies any fevers, chills, sweats, body ache, cough, shortness of breath, sinus pain or pressure, headache, abdominal pain, diarrhea  Past Medical History:  Diagnosis Date  . Diabetes mellitus without complication ([place])   . Erectile dysfunction   . Hypertension   . Right-sided chest pain    Social History   Tobacco Use  . Smoking status: Current Some Day Smoker    Types: Cigarettes  . Smokeless tobacco: Never Used  . Tobacco comment: pack of  cigarettes last 1-2 months - doesnt inhale  Substance Use Topics  . Alcohol use: Yes    Alcohol/week: 1.0 standard drinks    Types: 1 Cans of beer per week    Comment: 1-2 beers week   . Drug use: No    Current Outpatient Medications:  .  ACCU-CHEK AVIVA PLUS test strip, Check sugar up to 2 x daily, Disp: 200 each, Rfl: 5 .  Accu-Chek FastClix Lancets MISC, Check sugar up to 2 x daily, Disp: 102 each, Rfl: 3 .  Accu-Chek Softclix Lancets lancets, Check sugar up to 2 x daily, Disp: 200 each, Rfl: 5 .  Blood Glucose Monitoring Suppl (ACCU-CHEK AVIVA PLUS) w/Device KIT, Use to check blood sugar up to x 2 daily, Disp: 1 kit, Rfl: 0 .  Dulaglutide (TRULICITY) 1.5 UL/8.4TX SOPN, Inject 1.5 mg into the skin once a week., Disp: 4 pen, Rfl: 0 .  glipiZIDE (GLUCOTROL) 5 MG tablet, Take 0.5 tablets (2.5 mg total) by mouth daily before breakfast., Disp: 45 tablet, Rfl: 1 .  lisinopril-hydrochlorothiazide (ZESTORETIC) 20-12.5 MG tablet, Take 1 tablet by mouth daily., Disp: 90 tablet, Rfl: 3 .  metFORMIN (GLUCOPHAGE) 1000 MG tablet, TAKE 1 TABLET(1000 MG) BY MOUTH TWICE DAILY WITH A MEAL, Disp: 180 tablet, Rfl: 3 .  pravastatin (PRAVACHOL) 10 MG tablet, TAKE 1 TABLET(10 MG) BY MOUTH DAILY, Disp: 90 tablet, Rfl: 3 .  tadalafil (ADCIRCA/CIALIS) 20 MG tablet, Take 1 tablet (20 mg total) by mouth daily as needed for erectile dysfunction., Disp: 30 tablet, Rfl: 0 .  acetaminophen (TYLENOL) 325 MG tablet, Take 2 tablets (650 mg total) by mouth every 6 (six) hours as needed for mild pain  or headache (fever >/= 101). (Patient not taking: Reported on 11/09/2018), Disp:  , Rfl:  .  Selenium Sulfide 2.25 % SHAM, APPLY EXTERNALLY TO THE AFFECTED AREA DAILY AS NEEDED FOR IRRITATION (Patient not taking: Reported on 11/02/2018), Disp: 180 mL, Rfl: 0  Depression screen [facility] 2/9 09/02/2018 05/01/2018 10/14/2017  Decreased Interest 0 0 0  Down, Depressed, Hopeless 0 0 0  PHQ - 2 Score 0 0 0  Altered sleeping - - -  Tired,  decreased energy - - -  Change in appetite - - -  Feeling bad or failure about yourself  - - -  Trouble concentrating - - -  Moving slowly or fidgety/restless - - -  Suicidal thoughts - - -  PHQ-9 Score - - -  Difficult doing work/chores - - -    No flowsheet data found.  -------------------------------------------------------------------------- O: No physical exam performed due to remote telephone encounter.  Lab results reviewed.  Recent Results (from the past 2160 hour(s))  POCT HgB A1C     Status: Abnormal   Collection Time: 09/02/18  1:42 PM  Result Value Ref Range   Hemoglobin A1C 6.3 (A) 4.0 - 5.6 %  CBC WITH DIFFERENTIAL     Status: Abnormal   Collection Time: 10/22/18 12:24 PM  Result Value Ref Range   WBC 7.5 4.0 - 10.5 K/uL   RBC 4.52 4.22 - 5.81 MIL/uL   Hemoglobin 13.7 13.0 - 17.0 g/dL   HCT 39.4 39.0 - 52.0 %   MCV 87.2 80.0 - 100.0 fL   MCH 30.3 26.0 - 34.0 pg   MCHC 34.8 30.0 - 36.0 g/dL   RDW 11.2 (L) 11.5 - 15.5 %   Platelets 235 150 - 400 K/uL   nRBC 0.0 0.0 - 0.2 %   Neutrophils Relative % 91 %   Neutro Abs 6.8 1.7 - 7.7 K/uL   Lymphocytes Relative 5 %   Lymphs Abs 0.4 (L) 0.7 - 4.0 K/uL   Monocytes Relative 3 %   Monocytes Absolute 0.2 0.1 - 1.0 K/uL   Eosinophils Relative 0 %   Eosinophils Absolute 0.0 0.0 - 0.5 K/uL   Basophils Relative 0 %   Basophils Absolute 0.0 0.0 - 0.1 K/uL   Immature Granulocytes 1 %   Abs Immature Granulocytes 0.10 (H) 0.00 - 0.07 K/uL    Comment: Performed at [facility], [address] [place], [place] [address]  Comprehensive metabolic panel     Status: Abnormal   Collection Time: 10/22/18 12:24 PM  Result Value Ref Range   Sodium 128 (L) 135 - 145 mmol/L   Potassium 3.8 3.5 - 5.1 mmol/L   Chloride 92 (L) 98 - 111 mmol/L   CO2 22 22 - 32 mmol/L   Glucose, Bld 224 (H) 70 - 99 mg/dL   BUN 32 (H) 8 - 23 mg/dL   Creatinine, Ser 1.20 0.61 - 1.24 mg/dL   Calcium 8.3 (L) 8.9 - 10.3 mg/dL   Total Protein  7.6 6.5 - 8.1 g/dL   Albumin 3.1 (L) 3.5 - 5.0 g/dL   AST 37 15 - 41 U/L   ALT 22 0 - 44 U/L   Alkaline Phosphatase 52 38 - 126 U/L   Total Bilirubin 1.2 0.3 - 1.2 mg/dL   GFR calc non Af Amer >60 >60 mL/min   GFR calc Af Amer >60 >60 mL/min   Anion gap 14 5 - 15    Comment: Performed at [facility], [address] [place], [place] [address]  Fibrin derivatives D-Dimer     Status: Abnormal   Collection Time: 10/22/18 12:24 PM  Result Value Ref Range   Fibrin derivatives D-dimer ([facility]) 1,689.41 (H) 0.00 - 499.00 ng/mL (FEU)    Comment: (NOTE) <> Exclusion of Venous Thromboembolism (VTE) - OUTPATIENT ONLY   (Emergency Department or [place])   0-499 ng/ml (FEU): With a low to intermediate pretest probability                      for VTE this test result excludes the diagnosis                      of VTE.   >499 ng/ml (FEU) : VTE not excluded; additional work up for VTE is                      required. <> Testing on Inpatients and Evaluation of Disseminated Intravascular   Coagulation (DIC) Reference Range:   0-499 ng/ml (FEU) Performed at [facility], [address] [place], [place] [address]   Procalcitonin     Status: None   Collection Time: 10/22/18 12:24 PM  Result Value Ref Range   Procalcitonin 0.97 ng/mL    Comment:        Interpretation: PCT > 0.5 ng/mL and <= 2 ng/mL: Systemic infection (sepsis) is possible, but other conditions are known to elevate PCT as well. (NOTE)       Sepsis PCT Algorithm           Lower Respiratory Tract                                      Infection PCT Algorithm    ----------------------------     ----------------------------         PCT < 0.25 ng/mL                PCT < 0.10 ng/mL         Strongly encourage             Strongly discourage   discontinuation of antibiotics    initiation of antibiotics    ----------------------------     -----------------------------       PCT 0.25 - 0.50 ng/mL            PCT 0.10 -  0.25 ng/mL               OR       >80% decrease in PCT            Discourage initiation of                                            antibiotics      Encourage discontinuation           of antibiotics    ----------------------------     -----------------------------         PCT >= 0.50 ng/mL              PCT 0.26 - 0.50 ng/mL                AND       <80% decrease in PCT  Encourage initiation of                                             antibiotics       Encourage continuation           of antibiotics    ----------------------------     -----------------------------        PCT >= 0.50 ng/mL                  PCT > 0.50 ng/mL               AND         increase in PCT                  Strongly encourage                                      initiation of antibiotics    Strongly encourage escalation           of antibiotics                                     -----------------------------                                           PCT <= 0.25 ng/mL                                                 OR                                        > 80% decrease in PCT                                     Discontinue / Do not initiate                                             antibiotics Performed at [facility], [address] [place], [place] [address]   Lactate dehydrogenase     Status: Abnormal   Collection Time: 10/22/18 12:24 PM  Result Value Ref Range   LDH 235 (H) 98 - 192 U/L    Comment: Performed at [facility], [address] [place], [place] [address]  Ferritin     Status: Abnormal   Collection Time: 10/22/18 12:24 PM  Result Value Ref Range   Ferritin 698 (H) 24 - 336 ng/mL    Comment: Performed at [facility], [address] [place], [place] [address]  Triglycerides     Status: None   Collection Time: 10/22/18 12:24 PM  Result Value Ref Range  Triglycerides 98 <150 mg/dL    Comment: Performed at [facility], [address] [place], [place] [address]  Fibrinogen     Status: Abnormal   Collection Time: 10/22/18 12:24 PM  Result Value Ref Range   Fibrinogen >750 (H) 210 - 475 mg/dL    Comment: Performed at [facility], [place], [place] [address]  C-reactive protein     Status: Abnormal   Collection Time: 10/22/18 12:24 PM  Result Value Ref Range   CRP 24.9 (H) <1.0 mg/dL    Comment: Performed at [facility], [place] [address], [place], [place] [address]  Lactic acid, plasma     Status: None   Collection Time: 10/22/18 12:26 PM  Result Value Ref Range   Lactic Acid, Venous 1.6 0.5 - 1.9 mmol/L    Comment: Performed at [facility], [address] [place], [place] [address]  SARS Coronavirus 2 [facility] order, Performed in [facility] hospital lab) Nasopharyngeal Nasopharyngeal Swab     Status: Abnormal   Collection Time: 10/22/18 12:39 PM   Specimen: Nasopharyngeal Swab  Result Value Ref Range   SARS Coronavirus 2 POSITIVE (A) NEGATIVE    Comment: RESULT CALLED TO, READ BACK BY AND VERIFIED WITH: Henefer AT 6384 ON 10/22/2018 [place]. (NOTE) If result is NEGATIVE SARS-CoV-2 target nucleic acids are NOT DETECTED. The SARS-CoV-2 RNA is generally detectable in upper and lower  respiratory specimens during the acute phase of infection. The lowest  concentration of SARS-CoV-2 viral copies this assay can detect is 250  copies / mL. A negative result does not preclude SARS-CoV-2 infection  and should not be used as the sole basis for treatment or other  patient management decisions.  A negative result may occur with  improper specimen collection / handling, submission of specimen other  than nasopharyngeal swab, presence of viral mutation(s) within the  areas targeted by this assay, and inadequate number of viral copies  (<250 copies / mL). A negative result must be combined with clinical  observations, patient history, and epidemiological information. If  result is POSITIVE SARS-CoV-2 target nucleic acids are DETECTED.  The SARS-CoV-2 RNA is generally detectable in upper and lower  respiratory specimens during the acute phase of infection.  Positive  results are indicative of active infection with SARS-CoV-2.  Clinical  correlation with patient history and other diagnostic information is  necessary to determine patient infection status.  Positive results do  not rule out bacterial infection or co-infection with other viruses. If result is PRESUMPTIVE POSTIVE SARS-CoV-2 nucleic acids MAY BE PRESENT.   A presumptive positive result was obtained on the submitted specimen  and confirmed on repeat testing.  While 2019 novel coronavirus  (SARS-CoV-2) nucleic acids may be present in the submitted sample  additional confirmatory testing may be necessary for epidemiological  and / or clinical management purposes  to differentiate between  SARS-CoV-2 and other Sarbecovirus currently known to infect humans.  If clinically indicated additional testing with an alternate test  methodology [phone])  is advised. The SARS-CoV-2 RNA is generally  detectable in upper and lower respiratory specimens during the acute  phase of infection. The expected result is Negative. Fact Sheet for Patients:  StrictlyIdeas.no Fact Sheet for Healthcare Providers: BankingDealers.co.za This test is not yet approved or cleared by the [place] FDA and has been authorized for detection and/or diagnosis of SARS-CoV-2 by FDA under an Emergency Use Authorization (EUA).  This EUA will remain in  effect (meaning this test can be used) for the duration of the COVID-19 declaration under Section 564(b)(1) of the Act, 21 U.S.C. section 360bbb-3(b)(1), unless the authorization is terminated or revoked sooner. Performed at [facility], [place], [place] [address]   Lactic acid, plasma     Status: Abnormal    Collection Time: 10/22/18 12:39 PM  Result Value Ref Range   Lactic Acid, Venous 2.1 (HH) 0.5 - 1.9 mmol/L    Comment: CRITICAL RESULT CALLED TO, READ BACK BY AND VERIFIED WITH ANNIE SMITH AT 1314 ON 10/22/2018 [place] Performed at [facility], [address] [place], [place] [address]   Blood Culture (routine x 2)     Status: None   Collection Time: 10/22/18 12:39 PM   Specimen: BLOOD  Result Value Ref Range   Specimen Description BLOOD BLOOD LEFT FOREARM    Special Requests      BOTTLES DRAWN AEROBIC AND ANAEROBIC Blood Culture adequate volume   Culture      NO GROWTH 5 DAYS Performed at [facility], [address], [place], [place] [address]    Report Status 10/27/2018 FINAL   Blood Culture (routine x 2)     Status: None   Collection Time: 10/22/18 12:39 PM   Specimen: BLOOD  Result Value Ref Range   Specimen Description BLOOD LEFT ANTECUBITAL    Special Requests      BOTTLES DRAWN AEROBIC AND ANAEROBIC Blood Culture adequate volume   Culture      NO GROWTH 5 DAYS Performed at [facility], [address] [place], [place] [address]    Report Status 10/27/2018 FINAL   Glucose, capillary     Status: Abnormal   Collection Time: 10/23/18 12:03 AM  Result Value Ref Range   Glucose-Capillary 242 (H) 70 - 99 mg/dL  ABO/Rh     Status: None   Collection Time: 10/23/18  2:56 AM  Result Value Ref Range   ABO/RH(D)      O POS Performed at [facility], [place] [address], [place], [place] [address]   CBC with Differential/Platelet     Status: Abnormal   Collection Time: 10/23/18  2:56 AM  Result Value Ref Range   WBC 5.1 4.0 - 10.5 K/uL   RBC 4.33 4.22 - 5.81 MIL/uL   Hemoglobin 13.1 13.0 - 17.0 g/dL   HCT 39.3 39.0 - 52.0 %   MCV 90.8 80.0 - 100.0 fL   MCH 30.3 26.0 - 34.0 pg   MCHC 33.3 30.0 - 36.0 g/dL   RDW 11.4 (L) 11.5 - 15.5 %   Platelets 269 150 - 400 K/uL   nRBC 0.0 0.0 - 0.2 %   Neutrophils Relative % 89 %    Neutro Abs 4.6 1.7 - 7.7 K/uL   Lymphocytes Relative 8 %   Lymphs Abs 0.4 (L) 0.7 - 4.0 K/uL   Monocytes Relative 2 %   Monocytes Absolute 0.1 0.1 - 1.0 K/uL   Eosinophils Relative 0 %   Eosinophils Absolute 0.0 0.0 - 0.5 K/uL   Basophils Relative 0 %   Basophils Absolute 0.0 0.0 - 0.1 K/uL   WBC Morphology MILD LEFT SHIFT (1-5% METAS, OCC MYELO, OCC BANDS)     Comment: RARE BANDS SEEN   Immature Granulocytes 1 %   Abs Immature Granulocytes 0.05 0.00 - 0.07 K/uL    Comment: Performed at [facility], [place] [address], [place], [place] [address]  Comprehensive metabolic  panel     Status: Abnormal   Collection Time: 10/23/18  2:56 AM  Result Value Ref Range   Sodium 132 (L) 135 - 145 mmol/L   Potassium 4.0 3.5 - 5.1 mmol/L   Chloride 96 (L) 98 - 111 mmol/L   CO2 24 22 - 32 mmol/L   Glucose, Bld 285 (H) 70 - 99 mg/dL   BUN 30 (H) 8 - 23 mg/dL   Creatinine, Ser 0.94 0.61 - 1.24 mg/dL   Calcium 8.2 (L) 8.9 - 10.3 mg/dL   Total Protein 6.9 6.5 - 8.1 g/dL   Albumin 2.7 (L) 3.5 - 5.0 g/dL   AST 28 15 - 41 U/L   ALT 21 0 - 44 U/L   Alkaline Phosphatase 52 38 - 126 U/L   Total Bilirubin 0.7 0.3 - 1.2 mg/dL   GFR calc non Af Amer >60 >60 mL/min   GFR calc Af Amer >60 >60 mL/min   Anion gap 12 5 - 15    Comment: Performed at [facility], [place] [address] [place], [place] [address]  C-reactive protein     Status: Abnormal   Collection Time: 10/23/18  2:56 AM  Result Value Ref Range   CRP 23.0 (H) <1.0 mg/dL    Comment: Performed at [facility], [place] [address], [place], [place] [address]  D-dimer, quantitative (not at [facility])     Status: Abnormal   Collection Time: 10/23/18  2:56 AM  Result Value Ref Range   D-Dimer, Quant 1.86 (H) 0.00 - 0.50 ug/mL-FEU    Comment: (NOTE) At the manufacturer cut-off of 0.50 ug/mL FEU, this assay has been documented to exclude PE with a sensitivity and negative predictive value of 97 to 99%.  At  this time, this assay has not been approved by the FDA to exclude DVT/VTE. Results should be correlated with clinical presentation. Performed at [facility], [place] [address], [place], [place] [address]   Ferritin     Status: Abnormal   Collection Time: 10/23/18  2:56 AM  Result Value Ref Range   Ferritin 774 (H) 24 - 336 ng/mL    Comment: Performed at [facility], [place] [address], [place], [place] [address]  Hemoglobin A1c     Status: Abnormal   Collection Time: 10/23/18  2:56 AM  Result Value Ref Range   Hgb A1c MFr Bld 6.3 (H) 4.8 - 5.6 %    Comment: (NOTE) Pre diabetes:          5.7%-6.4% Diabetes:              >6.4% Glycemic control for   <7.0% adults with diabetes    Mean Plasma Glucose 134.11 mg/dL    Comment: Performed at [place] [address], [place], [place] [address]  TSH     Status: None   Collection Time: 10/23/18  2:56 AM  Result Value Ref Range   TSH 0.621 0.350 - 4.500 uIU/mL    Comment: Performed by a 3rd Generation assay with a functional sensitivity of <=0.01 uIU/mL. Performed at [facility], [place] [address] [place],  [address]   T4, free     Status: Abnormal   Collection Time: 10/23/18  2:56 AM  Result Value Ref Range   Free T4 1.18 (H) 0.61 - 1.12 ng/dL    Comment: (NOTE) Biotin ingestion may interfere with free T4 tests. If the results are inconsistent with the TSH level, previous  test results, or the clinical presentation, then consider biotin interference. If needed, order repeat testing after stopping biotin. Performed at [facility], [place] [address], [place], [place] [address]   Glucose, capillary     Status: Abnormal   Collection Time: 10/23/18  7:40 AM  Result Value Ref Range   Glucose-Capillary 246 (H) 70 - 99 mg/dL  Glucose, capillary     Status: Abnormal   Collection Time: 10/23/18 11:36 AM  Result Value Ref Range   Glucose-Capillary 245 (H) 70 - 99 mg/dL   Glucose, capillary     Status: Abnormal   Collection Time: 10/23/18  5:23 PM  Result Value Ref Range   Glucose-Capillary 223 (H) 70 - 99 mg/dL  Glucose, capillary     Status: Abnormal   Collection Time: 10/23/18  9:22 PM  Result Value Ref Range   Glucose-Capillary 227 (H) 70 - 99 mg/dL  CBC with Differential/Platelet     Status: Abnormal   Collection Time: 10/24/18  3:47 AM  Result Value Ref Range   WBC 7.0 4.0 - 10.5 K/uL   RBC 4.50 4.22 - 5.81 MIL/uL   Hemoglobin 13.6 13.0 - 17.0 g/dL   HCT 40.9 39.0 - 52.0 %   MCV 90.9 80.0 - 100.0 fL   MCH 30.2 26.0 - 34.0 pg   MCHC 33.3 30.0 - 36.0 g/dL   RDW 11.4 (L) 11.5 - 15.5 %   Platelets 315 150 - 400 K/uL   nRBC 0.0 0.0 - 0.2 %   Neutrophils Relative % 87 %   Neutro Abs 6.1 1.7 - 7.7 K/uL   Lymphocytes Relative 7 %   Lymphs Abs 0.5 (L) 0.7 - 4.0 K/uL   Monocytes Relative 5 %   Monocytes Absolute 0.4 0.1 - 1.0 K/uL   Eosinophils Relative 0 %   Eosinophils Absolute 0.0 0.0 - 0.5 K/uL   Basophils Relative 0 %   Basophils Absolute 0.0 0.0 - 0.1 K/uL   Immature Granulocytes 1 %   Abs Immature Granulocytes 0.07 0.00 - 0.07 K/uL    Comment: Performed at [facility], [place] [address], [place], [place] [address]  Comprehensive metabolic panel     Status: Abnormal   Collection Time: 10/24/18  3:47 AM  Result Value Ref Range   Sodium 134 (L) 135 - 145 mmol/L   Potassium 4.5 3.5 - 5.1 mmol/L   Chloride 99 98 - 111 mmol/L   CO2 25 22 - 32 mmol/L   Glucose, Bld 200 (H) 70 - 99 mg/dL   BUN 34 (H) 8 - 23 mg/dL   Creatinine, Ser 0.95 0.61 - 1.24 mg/dL   Calcium 8.4 (L) 8.9 - 10.3 mg/dL   Total Protein 6.6 6.5 - 8.1 g/dL   Albumin 2.7 (L) 3.5 - 5.0 g/dL   AST 27 15 - 41 U/L   ALT 24 0 - 44 U/L   Alkaline Phosphatase 50 38 - 126 U/L   Total Bilirubin 0.4 0.3 - 1.2 mg/dL   GFR calc non Af Amer >60 >60 mL/min   GFR calc Af Amer >60 >60 mL/min   Anion gap 10 5 - 15    Comment: Performed at [facility], [place] [address] [place], [place] [address]  C-reactive protein     Status: Abnormal   Collection Time: 10/24/18  3:47 AM  Result Value Ref Range   CRP 12.2 (H) <1.0 mg/dL    Comment: Performed at [facility], [address] [place], [place] [address]  D-dimer, quantitative (not at [facility])     Status: Abnormal   Collection Time: 10/24/18  3:47 AM  Result Value Ref Range   D-Dimer, Quant 1.49 (H) 0.00 - 0.50 ug/mL-FEU    Comment: (NOTE) At the manufacturer cut-off of 0.50 ug/mL FEU, this assay has been documented to exclude PE with a sensitivity and negative predictive value of 97 to 99%.  At this time, this assay has not been approved by the FDA to exclude DVT/VTE. Results should be correlated with clinical presentation. Performed at [facility], [place] [address], [place], [place] [address]   Ferritin     Status: Abnormal   Collection Time: 10/24/18  3:47 AM  Result Value Ref Range   Ferritin 714 (H) 24 - 336 ng/mL    Comment: Performed at [facility], [place] [address] [place], [place] [address]  Glucose, capillary     Status: Abnormal   Collection Time: 10/24/18 11:45 AM  Result Value Ref Range   Glucose-Capillary 179 (H) 70 - 99 mg/dL  Glucose, capillary     Status: Abnormal   Collection Time: 10/24/18  4:29 PM  Result Value Ref Range   Glucose-Capillary 140 (H) 70 - 99 mg/dL  Glucose, capillary     Status: Abnormal   Collection Time: 10/24/18  9:38 PM  Result Value Ref Range   Glucose-Capillary 187 (H) 70 - 99 mg/dL  CBC with Differential/Platelet     Status: Abnormal   Collection Time: 10/25/18  5:24 AM  Result Value Ref Range   WBC 5.7 4.0 - 10.5 K/uL   RBC 4.50 4.22 - 5.81 MIL/uL   Hemoglobin 13.6 13.0 - 17.0 g/dL   HCT 40.8 39.0 - 52.0 %   MCV 90.7 80.0 - 100.0 fL   MCH 30.2 26.0 - 34.0 pg   MCHC 33.3 30.0 - 36.0 g/dL   RDW 11.4 (L) 11.5 - 15.5 %   Platelets 300 150 - 400 K/uL   nRBC 0.0 0.0 - 0.2  %   Neutrophils Relative % 80 %   Neutro Abs 4.6 1.7 - 7.7 K/uL   Lymphocytes Relative 13 %   Lymphs Abs 0.7 0.7 - 4.0 K/uL   Monocytes Relative 7 %   Monocytes Absolute 0.4 0.1 - 1.0 K/uL   Eosinophils Relative 0 %   Eosinophils Absolute 0.0 0.0 - 0.5 K/uL   Basophils Relative 0 %   Basophils Absolute 0.0 0.0 - 0.1 K/uL   Immature Granulocytes 0 %   Abs Immature Granulocytes 0.02 0.00 - 0.07 K/uL    Comment: Performed at [facility], [place] [address] [place], [place] [address]  Comprehensive metabolic panel     Status: Abnormal   Collection Time: 10/25/18  5:24 AM  Result Value Ref Range   Sodium 138 135 - 145 mmol/L   Potassium 4.8 3.5 - 5.1 mmol/L   Chloride 103 98 - 111 mmol/L   CO2 26 22 - 32 mmol/L   Glucose, Bld 142 (H) 70 - 99 mg/dL   BUN 25 (H) 8 - 23 mg/dL   Creatinine, Ser 0.89 0.61 - 1.24 mg/dL   Calcium 8.5 (L) 8.9 - 10.3 mg/dL   Total Protein 6.5 6.5 - 8.1 g/dL   Albumin 2.7 (L) 3.5 - 5.0 g/dL   AST 29 15 - 41 U/L   ALT 24 0 - 44 U/L   Alkaline Phosphatase 51 38 - 126 U/L  Total Bilirubin 0.5 0.3 - 1.2 mg/dL   GFR calc non Af Amer >60 >60 mL/min   GFR calc Af Amer >60 >60 mL/min   Anion gap 9 5 - 15    Comment: Performed at [facility], [place] [address], [place], [place] [address]  C-reactive protein     Status: Abnormal   Collection Time: 10/25/18  5:24 AM  Result Value Ref Range   CRP 7.5 (H) <1.0 mg/dL    Comment: Performed at [facility], [place] [address] [place], [place] [address]  D-dimer, quantitative (not at [facility])     Status: Abnormal   Collection Time: 10/25/18  5:24 AM  Result Value Ref Range   D-Dimer, Quant 1.22 (H) 0.00 - 0.50 ug/mL-FEU    Comment: (NOTE) At the manufacturer cut-off of 0.50 ug/mL FEU, this assay has been documented to exclude PE with a sensitivity and negative predictive value of 97 to 99%.  At this time, this assay has not been approved by the FDA to exclude  DVT/VTE. Results should be correlated with clinical presentation. Performed at [facility], [place] [address], [place], [place] [address]   Ferritin     Status: Abnormal   Collection Time: 10/25/18  5:24 AM  Result Value Ref Range   Ferritin 602 (H) 24 - 336 ng/mL    Comment: Performed at [facility], [place] [address] [place], [place] [address]  Glucose, capillary     Status: Abnormal   Collection Time: 10/25/18  8:01 AM  Result Value Ref Range   Glucose-Capillary 166 (H) 70 - 99 mg/dL  Glucose, capillary     Status: Abnormal   Collection Time: 10/25/18 11:12 AM  Result Value Ref Range   Glucose-Capillary 197 (H) 70 - 99 mg/dL  Glucose, capillary     Status: Abnormal   Collection Time: 10/25/18  4:29 PM  Result Value Ref Range   Glucose-Capillary 192 (H) 70 - 99 mg/dL  Glucose, capillary     Status: Abnormal   Collection Time: 10/25/18  9:57 PM  Result Value Ref Range   Glucose-Capillary 175 (H) 70 - 99 mg/dL  Comprehensive metabolic panel     Status: Abnormal   Collection Time: 10/26/18  5:15 AM  Result Value Ref Range   Sodium 136 135 - 145 mmol/L   Potassium 4.1 3.5 - 5.1 mmol/L   Chloride 103 98 - 111 mmol/L   CO2 23 22 - 32 mmol/L   Glucose, Bld 143 (H) 70 - 99 mg/dL   BUN 17 8 - 23 mg/dL   Creatinine, Ser 0.84 0.61 - 1.24 mg/dL   Calcium 8.3 (L) 8.9 - 10.3 mg/dL   Total Protein 6.4 (L) 6.5 - 8.1 g/dL   Albumin 2.7 (L) 3.5 - 5.0 g/dL   AST 38 15 - 41 U/L   ALT 36 0 - 44 U/L   Alkaline Phosphatase 52 38 - 126 U/L   Total Bilirubin 0.9 0.3 - 1.2 mg/dL   GFR calc non Af Amer >60 >60 mL/min   GFR calc Af Amer >60 >60 mL/min   Anion gap 10 5 - 15    Comment: Performed at [facility], [place] [address], [place], [place] [address]  C-reactive protein     Status: Abnormal   Collection Time: 10/26/18  5:15 AM  Result Value Ref Range   CRP 13.5 (H) <1.0 mg/dL    Comment: Performed at [facility],  [place] [address], [place], [place] [address]  Glucose, capillary     Status: Abnormal   Collection Time: 10/26/18  8:06 AM  Result Value Ref Range   Glucose-Capillary 176 (H) 70 - 99 mg/dL  Glucose, capillary     Status: Abnormal   Collection Time: 10/26/18 11:49 AM  Result Value Ref Range   Glucose-Capillary 224 (H) 70 - 99 mg/dL  Glucose, capillary     Status: Abnormal   Collection Time: 10/26/18  4:28 PM  Result Value Ref Range   Glucose-Capillary 115 (H) 70 - 99 mg/dL  Glucose, capillary     Status: Abnormal   Collection Time: 10/26/18  9:15 PM  Result Value Ref Range   Glucose-Capillary 191 (H) 70 - 99 mg/dL  Comprehensive metabolic panel     Status: Abnormal   Collection Time: 10/27/18  3:05 AM  Result Value Ref Range   Sodium 135 135 - 145 mmol/L   Potassium 4.3 3.5 - 5.1 mmol/L   Chloride 103 98 - 111 mmol/L   CO2 23 22 - 32 mmol/L   Glucose, Bld 128 (H) 70 - 99 mg/dL   BUN 14 8 - 23 mg/dL   Creatinine, Ser 0.71 0.61 - 1.24 mg/dL   Calcium 8.0 (L) 8.9 - 10.3 mg/dL   Total Protein 5.6 (L) 6.5 - 8.1 g/dL   Albumin 2.4 (L) 3.5 - 5.0 g/dL   AST 27 15 - 41 U/L   ALT 31 0 - 44 U/L   Alkaline Phosphatase 49 38 - 126 U/L   Total Bilirubin 0.6 0.3 - 1.2 mg/dL   GFR calc non Af Amer >60 >60 mL/min   GFR calc Af Amer >60 >60 mL/min   Anion gap 9 5 - 15    Comment: Performed at [facility], [place] [address] [place], [place] [address]  C-reactive protein     Status: Abnormal   Collection Time: 10/27/18  3:05 AM  Result Value Ref Range   CRP 10.6 (H) <1.0 mg/dL    Comment: Performed at [facility], [place] [address], [place],  [address]  Glucose, capillary     Status: Abnormal   Collection Time: 10/27/18  8:17 AM  Result Value Ref Range   Glucose-Capillary 144 (H) 70 - 99 mg/dL  Glucose, capillary     Status: Abnormal   Collection Time: 10/27/18 11:28 AM  Result Value Ref Range   Glucose-Capillary 291 (H) 70 - 99 mg/dL  Glucose,  capillary     Status: Abnormal   Collection Time: 10/27/18  4:18 PM  Result Value Ref Range   Glucose-Capillary 242 (H) 70 - 99 mg/dL  Glucose, capillary     Status: Abnormal   Collection Time: 10/27/18  9:04 PM  Result Value Ref Range   Glucose-Capillary 224 (H) 70 - 99 mg/dL  Glucose, capillary     Status: Abnormal   Collection Time: 10/27/18  9:39 PM  Result Value Ref Range   Glucose-Capillary 204 (H) 70 - 99 mg/dL  Glucose, capillary     Status: Abnormal   Collection Time: 10/28/18  7:41 AM  Result Value Ref Range   Glucose-Capillary 185 (H) 70 - 99 mg/dL  Glucose, capillary     Status: Abnormal   Collection Time: 10/28/18 11:56 AM  Result Value Ref Range   Glucose-Capillary 271 (H) 70 - 99 mg/dL    -------------------------------------------------------------------------- A&P:  Problem List Items Addressed This Visit    None    Visit  Diagnoses    COVID-19    -  Primary     Resolved [id number] w/ complicated pneumonia, hospitalization.  Completed FMLA paperwork for his incapacitation episode due to acute illness COVID19/pneumonia, now anticipate he will return to work full duty without restrictions on 11/23/18.  Forms to be faxed on 11/18/18, and scanned   No orders of the defined types were placed in this encounter.   Follow-up: - Return as needed  Patient verbalizes understanding with the above medical recommendations including the limitation of remote medical advice.  Specific follow-up and call-back criteria were given for patient to follow-up or seek medical care more urgently if needed.   - Time spent in direct consultation with patient on phone: 11 minutes   Nobie Putnam, [facility] 11/17/2018, 3:35 PM

## 2018-11-17 NOTE — Patient Instructions (Addendum)
FMLA paperwork was submitted Return to work on 11/23/18  Please schedule a Follow-up Appointment to: Return if symptoms worsen or fail to improve.  If you have any other questions or concerns, please feel free to call the office or send a message through [place]. You may also schedule an earlier appointment if necessary.  Additionally, you may be receiving a survey about your experience at our office within a few days to 1 week by e-mail or mail. We value your feedback.  Nobie Putnam, DO [place]

## 2018-11-19 ENCOUNTER — Ambulatory Visit: Payer: Self-pay | Admitting: Pharmacist

## 2018-11-19 ENCOUNTER — Telehealth: Payer: Self-pay

## 2018-11-19 DIAGNOSIS — E113293 Type 2 diabetes mellitus with mild nonproliferative diabetic retinopathy without macular edema, bilateral: Secondary | ICD-10-CM

## 2018-11-19 DIAGNOSIS — I10 Essential (primary) hypertension: Secondary | ICD-10-CM | POA: Diagnosis not present

## 2018-11-19 NOTE — Patient Instructions (Signed)
Thank you allowing the Chronic Care Management Team to be a part of your care! It was a pleasure speaking with you today!     CCM (Chronic Care Management) Team    Janci Minor RN, BSN Nurse Care Coordinator  [phone]   Harlow Asa PharmD  Clinical Pharmacist  [phone]   Eula Fried LCSW Clinical Social Worker [phone]  Visit Information  Goals Addressed            This Visit's Progress   . PharmD - "I want to get my A1C down" (pt-stated)       Current Barriers:  [name] Medication Adherence  . Financial Barriers . Knowledge deficits related to nutrition with diabetes  Pharmacist Clinical Goal(s):  [name] Over the next 30 days, patient will continue to work with PharmD to address needs related to address needs related to diabetes management.  . Over the next 30 days, patient will demonstrate maintained medication adherence as evidenced by patient report of adherence  Interventions:   . Counsel on importance of blood sugar control and monitoring o Mr. Frisbie reports taking: - Metformin 1000 mg twice daily - Trulicity 1.5 mg weekly - Glipizide 5 mg - 1/2 tablet (2.5 mg) daily - Review recent blood sugar results (see below) o Mr. Kuehl confirms he resumed taking glipizide 2.5 mg QAM as prescribed after our last call o Counsel on importance of eating regular and balanced meals as well as limiting carbohydrate portion sizes - Provide verbal education on sources of carbohydrates . Remind Mr. Hillery of process for ordering refills of Trulicity from patient assistance program . Provide Mr. Navarrete with phone number for [facility]  . Will mail patient booklet "Living Well with Diabetes" as requested for further diabetes education.  Patient Self Care Activities:  . Self administers all medications as prescribed o Using weekly pillbox as adherence tool . To check blood sugars daily to twice daily and keep log. Date Fasting Blood Glucose Before Lunch  After Lunch Before Supper Notes  22 - October 135      23 - October 134   98   24 - October   171*  *After pasta  25 - October       26 - October 140 115     27 - October 121      28 - October  106     29 - October 130       . To contact pharmacy to order refills of medication . Calls provider office for new concerns or questions. . Patient to eat regular meals  Please see past updates related to this goal by clicking on the "Past Updates" button in the selected goal         The patient verbalized understanding of instructions provided today and declined a print copy of patient instruction materials.   The care management team will reach out to the patient again over the next 30 days.   Harlow Asa, PharmD, [place] [employer] [phone]

## 2018-11-19 NOTE — Chronic Care Management (AMB) (Signed)
Chronic Care Management   Follow Up Note   11/19/2018 Name: [name]. MRN: [id number] DOB: 1945-09-03  Referred by: [name], DO Reason for referral : Chronic Care Management (Patient Phone Call)   [name]. is a 73 y.o. year old male who is a primary care patient of [name], DO. The CCM team was consulted for assistance with chronic disease management and care coordination needs.  [name] a past medical history including but not limited to: hypertension, type 2 diabetes and hyperlipidemia  I reached out to [name]. by phone today.   Review of patient status, including review of consultants reports, relevant laboratory and other test results, and collaboration with appropriate care team members and the patient's provider was performed as part of comprehensive patient evaluation and provision of chronic care management services.     Outpatient Encounter Medications as of 11/19/2018  Medication Sig Note  . Dulaglutide (TRULICITY) 1.5 AC/1.6SA SOPN Inject 1.5 mg into the skin once a week. 11/19/2018: On Mondays  . glipiZIDE (GLUCOTROL) 5 MG tablet Take 0.5 tablets (2.5 mg total) by mouth daily before breakfast.   . metFORMIN (GLUCOPHAGE) 1000 MG tablet TAKE 1 TABLET(1000 MG) BY MOUTH TWICE DAILY WITH A MEAL   . ACCU-CHEK AVIVA PLUS test strip Check sugar up to 2 x daily   . Accu-Chek FastClix Lancets MISC Check sugar up to 2 x daily   . Accu-Chek Softclix Lancets lancets Check sugar up to 2 x daily   . acetaminophen (TYLENOL) 325 MG tablet Take 2 tablets (650 mg total) by mouth every 6 (six) hours as needed for mild pain or headache (fever >/= 101). (Patient not taking: Reported on 11/09/2018)   . Blood Glucose Monitoring Suppl (ACCU-CHEK AVIVA PLUS) w/Device KIT Use to check blood sugar up to x 2 daily   . lisinopril-hydrochlorothiazide (ZESTORETIC) 20-12.5 MG tablet Take 1 tablet by mouth daily.   . pravastatin (PRAVACHOL) 10  MG tablet TAKE 1 TABLET(10 MG) BY MOUTH DAILY   . Selenium Sulfide 2.25 % SHAM APPLY EXTERNALLY TO THE AFFECTED AREA DAILY AS NEEDED FOR IRRITATION (Patient not taking: Reported on 11/02/2018)   . tadalafil (ADCIRCA/CIALIS) 20 MG tablet Take 1 tablet (20 mg total) by mouth daily as needed for erectile dysfunction.    No facility-administered encounter medications on file as of 11/19/2018.     Goals Addressed            This Visit's Progress   . PharmD - "I want to get my A1C down" (pt-stated)       Current Barriers:  [name] Medication Adherence  . Financial Barriers . Knowledge deficits related to nutrition with diabetes  Pharmacist Clinical Goal(s):  [name] Over the next 30 days, patient will continue to work with PharmD to address needs related to address needs related to diabetes management.  . Over the next 30 days, patient will demonstrate maintained medication adherence as evidenced by patient report of adherence  Interventions:   . Counsel on importance of blood sugar control and monitoring o [name] reports taking: - Metformin 1000 mg twice daily - Trulicity 1.5 mg weekly - Glipizide 5 mg - 1/2 tablet (2.5 mg) daily - Review recent blood sugar results (see below) o [name] confirms he resumed taking glipizide 2.5 mg QAM as prescribed after our last call o Counsel on importance of eating regular and balanced meals as well as limiting carbohydrate portion sizes - Provide verbal education on  sources of carbohydrates . Remind [name] of process for ordering refills of Trulicity from patient assistance program . Provide [name] with phone number for [facility] as he expresses concern about the expense of his recent hospitalization. States that he Mclaughlin not yet received this bill, but will follow up to see if he qualifies for [facility]. . Will mail patient booklet "Living Well with Diabetes" as requested for further diabetes education.  Patient Self Care  Activities:  . Self administers all medications as prescribed o Using weekly pillbox as adherence tool . To check blood sugars daily to twice daily and keep log. Date Fasting Blood Glucose Before Lunch After Lunch Before Supper Notes  22 - October 135      23 - October 134   98   24 - October   171*  *After pasta  25 - October       26 - October 140 115     27 - October 121      28 - October  106     29 - October 130       . To contact pharmacy to order refills of medication . Calls provider office for new concerns or questions. . Patient to eat regular meals  Please see past updates related to this goal by clicking on the "Past Updates" button in the selected goal         Plan  The care management team will reach out to the patient again over the next 30 days.   Harlow Asa, PharmD, [place] [employer] [phone]

## 2018-12-01 ENCOUNTER — Other Ambulatory Visit: Payer: Medicare HMO

## 2018-12-03 ENCOUNTER — Other Ambulatory Visit: Payer: Medicare HMO

## 2018-12-03 ENCOUNTER — Ambulatory Visit: Payer: Medicare HMO | Admitting: Licensed Clinical Social Worker

## 2018-12-03 ENCOUNTER — Other Ambulatory Visit: Payer: Self-pay

## 2018-12-03 DIAGNOSIS — E785 Hyperlipidemia, unspecified: Secondary | ICD-10-CM

## 2018-12-03 DIAGNOSIS — E113293 Type 2 diabetes mellitus with mild nonproliferative diabetic retinopathy without macular edema, bilateral: Secondary | ICD-10-CM | POA: Diagnosis not present

## 2018-12-03 DIAGNOSIS — Z0000 Encounter for general adult medical examination without abnormal findings: Secondary | ICD-10-CM | POA: Diagnosis not present

## 2018-12-03 DIAGNOSIS — E1169 Type 2 diabetes mellitus with other specified complication: Secondary | ICD-10-CM | POA: Diagnosis not present

## 2018-12-03 DIAGNOSIS — E1142 Type 2 diabetes mellitus with diabetic polyneuropathy: Secondary | ICD-10-CM

## 2018-12-03 DIAGNOSIS — R351 Nocturia: Secondary | ICD-10-CM | POA: Diagnosis not present

## 2018-12-03 DIAGNOSIS — I10 Essential (primary) hypertension: Secondary | ICD-10-CM | POA: Diagnosis not present

## 2018-12-03 DIAGNOSIS — IMO0002 Reserved for concepts with insufficient information to code with codable children: Secondary | ICD-10-CM

## 2018-12-03 NOTE — Chronic Care Management (AMB) (Signed)
Chronic Care Management    Clinical Social Work Follow Up Note  12/03/2018 Name: [name]. MRN: [id number] DOB: 09/23/1945  [name]. is a 73 y.o. year old male who is a primary care patient of Christopher Hauser, DO. The CCM team was consulted for assistance with Financial Difficulties related to managing health care.   Review of patient status, including review of consultants reports, other relevant assessments, and collaboration with appropriate care team members and the patient's provider was performed as part of comprehensive patient evaluation and provision of chronic care management services.    SDOH (Social Determinants of Health) screening performed today: Financial Strain . See Care Plan for related entries.   Advanced Directives Status: <no information> See Care Plan for related entries.   Outpatient Encounter Medications as of 12/03/2018  Medication Sig Note  . ACCU-CHEK AVIVA PLUS test strip Check sugar up to 2 x daily   . Accu-Chek FastClix Lancets MISC Check sugar up to 2 x daily   . Accu-Chek Softclix Lancets lancets Check sugar up to 2 x daily   . acetaminophen (TYLENOL) 325 MG tablet Take 2 tablets (650 mg total) by mouth every 6 (six) hours as needed for mild pain or headache (fever >/= 101). (Patient not taking: Reported on 11/09/2018)   . Blood Glucose Monitoring Suppl (ACCU-CHEK AVIVA PLUS) w/Device KIT Use to check blood sugar up to x 2 daily   . Dulaglutide (TRULICITY) 1.5 NO/6.7EH SOPN Inject 1.5 mg into the skin once a week. 11/19/2018: On Mondays  . glipiZIDE (GLUCOTROL) 5 MG tablet Take 0.5 tablets (2.5 mg total) by mouth daily before breakfast.   . lisinopril-hydrochlorothiazide (ZESTORETIC) 20-12.5 MG tablet Take 1 tablet by mouth daily.   . metFORMIN (GLUCOPHAGE) 1000 MG tablet TAKE 1 TABLET(1000 MG) BY MOUTH TWICE DAILY WITH A MEAL   . pravastatin (PRAVACHOL) 10 MG tablet TAKE 1 TABLET(10 MG) BY MOUTH DAILY   . Selenium Sulfide 2.25 %  SHAM APPLY EXTERNALLY TO THE AFFECTED AREA DAILY AS NEEDED FOR IRRITATION (Patient not taking: Reported on 11/02/2018)   . tadalafil (ADCIRCA/CIALIS) 20 MG tablet Take 1 tablet (20 mg total) by mouth daily as needed for erectile dysfunction.    No facility-administered encounter medications on file as of 12/03/2018.      Goals Addressed    . "I need more help in the home because I live alone." (pt-stated)       Current Barriers:  . Financial constraints related to affording a caregiver and managing health care expenses . Limited social support . ADL IADL limitations . Social Isolation . Limited access to caregiver  Clinical Social Work Clinical Goal(s):  [name] Over the next 120 days, patient will work with SW to address concerns related to gaining financial and personal care service resource education  Interventions: . Patient interviewed and appropriate assessments performed . Discussed plans with patient for ongoing care management follow up and provided patient with direct contact information for care management team . Assisted patient/caregiver with obtaining information about health plan benefits . A voluntary and extensive discussion about advanced care planning including explanation and discussion of advanced was undertaken with the patient. Explanation regarding healthcare proxy and living will was reviewed and packet with forms with explanation of how to fill them out was given.   [name] Resource education provided on [employer] and [place]. . Patient interested in being put on the wait list for [facility]. LCSW will complete this referral  within 45 days.  Patient Self Care Activities:  . Attends all scheduled provider appointments . Calls provider office for new concerns or questions .  [name] Unable to independently self pay for personal care services   Initial goal documentation     Follow Up Plan: SW will complete [facility] referral  within 45 days  Eula Fried, BSW, MSW, [email or web address] Phone: [phone]

## 2018-12-04 LAB — CBC WITH DIFFERENTIAL/PLATELET
Absolute Monocytes: 372 cells/uL (ref 200–950)
Basophils Absolute: 30 cells/uL (ref 0–200)
Basophils Relative: 0.8 %
Eosinophils Absolute: 42 cells/uL (ref 15–500)
Eosinophils Relative: 1.1 %
HCT: 40.4 % (ref 38.5–50.0)
Hemoglobin: 13.1 g/dL — ABNORMAL LOW (ref 13.2–17.1)
Lymphs Abs: 1634 cells/uL (ref 850–3900)
MCH: 29.6 pg (ref 27.0–33.0)
MCHC: 32.4 g/dL (ref 32.0–36.0)
MCV: 91.4 fL (ref 80.0–100.0)
MPV: 9.5 fL (ref 7.5–12.5)
Monocytes Relative: 9.8 %
Neutro Abs: 1721 cells/uL (ref 1500–7800)
Neutrophils Relative %: 45.3 %
Platelets: 178 10*3/uL (ref 140–400)
RBC: 4.42 10*6/uL (ref 4.20–5.80)
RDW: 13.4 % (ref 11.0–15.0)
Total Lymphocyte: 43 %
WBC: 3.8 10*3/uL (ref 3.8–10.8)

## 2018-12-04 LAB — PSA: PSA: 0.3 ng/mL (ref <=4.0)

## 2018-12-04 LAB — LIPID PANEL
Cholesterol: 179 mg/dL (ref <200)
HDL: 80 mg/dL (ref >=40)
LDL Cholesterol (Calc): 84 mg/dL (calc)
Non-HDL Cholesterol (Calc): 99 mg/dL (calc) (ref <130)
Total CHOL/HDL Ratio: 2.2 (calc) (ref <5.0)
Triglycerides: 72 mg/dL (ref <150)

## 2018-12-04 LAB — COMPLETE METABOLIC PANEL WITH GFR
AG Ratio: 1.5 (calc) (ref 1.0–2.5)
ALT: 12 U/L (ref 9–46)
AST: 16 U/L (ref 10–35)
Albumin: 4 g/dL (ref 3.6–5.1)
Alkaline phosphatase (APISO): 48 U/L (ref 35–144)
BUN: 12 mg/dL (ref 7–25)
CO2: 24 mmol/L (ref 20–32)
Calcium: 9.4 mg/dL (ref 8.6–10.3)
Chloride: 102 mmol/L (ref 98–110)
Creat: 1.03 mg/dL (ref 0.70–1.18)
GFR, Est African American: 84 mL/min/{1.73_m2} (ref >=60)
GFR, Est Non African American: 72 mL/min/{1.73_m2} (ref >=60)
Globulin: 2.7 g/dL (calc) (ref 1.9–3.7)
Glucose, Bld: 128 mg/dL — ABNORMAL HIGH (ref 65–99)
Potassium: 4.2 mmol/L (ref 3.5–5.3)
Sodium: 138 mmol/L (ref 135–146)
Total Bilirubin: 0.8 mg/dL (ref 0.2–1.2)
Total Protein: 6.7 g/dL (ref 6.1–8.1)

## 2018-12-04 LAB — HEMOGLOBIN A1C
Hgb A1c MFr Bld: 6.4 % of total Hgb — ABNORMAL HIGH (ref <5.7)
Mean Plasma Glucose: 137 (calc)
eAG (mmol/L): 7.6 (calc)

## 2018-12-05 ENCOUNTER — Other Ambulatory Visit: Payer: Self-pay | Admitting: Family Medicine

## 2018-12-05 DIAGNOSIS — E113293 Type 2 diabetes mellitus with mild nonproliferative diabetic retinopathy without macular edema, bilateral: Secondary | ICD-10-CM

## 2018-12-07 ENCOUNTER — Telehealth: Payer: Self-pay | Admitting: Family Medicine

## 2018-12-08 ENCOUNTER — Encounter: Payer: Medicare HMO | Admitting: Family Medicine

## 2018-12-10 ENCOUNTER — Ambulatory Visit (INDEPENDENT_AMBULATORY_CARE_PROVIDER_SITE_OTHER): Payer: Medicare HMO | Admitting: Family Medicine

## 2018-12-10 ENCOUNTER — Encounter: Payer: Self-pay | Admitting: Family Medicine

## 2018-12-10 ENCOUNTER — Other Ambulatory Visit: Payer: Self-pay

## 2018-12-10 VITALS — BP 128/77 | HR 76 | Temp 98.7°F | Resp 16 | Ht 71.0 in | Wt 184.0 lb

## 2018-12-10 DIAGNOSIS — E785 Hyperlipidemia, unspecified: Secondary | ICD-10-CM | POA: Diagnosis not present

## 2018-12-10 DIAGNOSIS — E1169 Type 2 diabetes mellitus with other specified complication: Secondary | ICD-10-CM | POA: Diagnosis not present

## 2018-12-10 DIAGNOSIS — I10 Essential (primary) hypertension: Secondary | ICD-10-CM | POA: Diagnosis not present

## 2018-12-10 DIAGNOSIS — E11319 Type 2 diabetes mellitus with unspecified diabetic retinopathy without macular edema: Secondary | ICD-10-CM | POA: Diagnosis not present

## 2018-12-10 DIAGNOSIS — E113293 Type 2 diabetes mellitus with mild nonproliferative diabetic retinopathy without macular edema, bilateral: Secondary | ICD-10-CM

## 2018-12-10 DIAGNOSIS — Z0000 Encounter for general adult medical examination without abnormal findings: Secondary | ICD-10-CM | POA: Diagnosis not present

## 2018-12-10 NOTE — Progress Notes (Signed)
Subjective:    Patient ID: [name]., male    DOB: 27-Aug-1945, 73 y.o.   MRN: [id number]  [name]. is a 73 y.o. male presenting on 12/10/2018 for Annual Exam   HPI   Here for Annual Physical and Lab Review  CHRONIC DM, Type 2w/ Retinopathy - Last visit with me8/2020 improvement on GLP1 regularly now with controlled a1c. See prior notes for background info Improved A1c on last lab, 6.4 - Today patient reportshe is doing very well overall. No new major concerns. No further significant hypoglycemia readings.  - Trulicity 3.5WS weekly injection - Metformin 1029m BID - Taking Glipizide 2.517m(half of 74m67mshort acting sulfonylurea daily w/ breakfast Reports good compliance. Tolerating well w/o side-effects Currently on ACEi Lifestyle: - Weight down - Diet (improve diet) - Exercise (Limited exercise due to time- gradually improving) - Dr WooEllin Mayhewst DM Eye exam5/2019 w/ DM retinopathy, requiring monitoring - has not rescheduled or returned, he is due to go back now. Denies hypoglycemia, polyuria, visual changes, numbness or tingling.  CHRONIC HTN: Reportsno new concerns. Controlled without problem. Current Meds -Lisinopril-HCTZ 20-12.74mg74mce daily Reports good compliance, took meds today. Tolerating well, w/o complaints. Denies CP, dyspnea, HA, edema, dizziness / lightheadedness  HYPERLIPIDEMIA: - Reports no concerns. Last lipid panel 11/2018, controlled  - Currently taking Pravastatin 20mg53mlerating well without side effects or myalgias   Health Maintenance: UTD Flu Vaccine   Depression screen PHQ [facility] 09/02/2018 05/01/2018  Decreased Interest 0 0 0  Down, Depressed, Hopeless 0 0 0  PHQ - 2 Score 0 0 0  Altered sleeping - - -  Tired, decreased energy - - -  Change in appetite - - -  Feeling bad or failure about yourself  - - -  Trouble concentrating - - -  Moving slowly or fidgety/restless - - -  Suicidal thoughts - - -  PHQ-9  Score - - -  Difficult doing work/chores - - -    Past Medical History:  Diagnosis Date  . Erectile dysfunction   . Hypertension   . Right-sided chest pain    Past Surgical History:  Procedure Laterality Date  . ROTATOR CUFF REPAIR     Social History   Socioeconomic History  . Marital status: Divorced    Spouse name: Not on file  . Number of children: Not on file  . Years of education: Not on file  . Highest education level: Not on file  Occupational History  . Not on file  Social Needs  . Financial resource strain: Not hard at all  . Food insecurity    Worry: Never true    Inability: Never true  . Transportation needs    Medical: No    Non-medical: No  Tobacco Use  . Smoking status: Current Some Day Smoker    Types: Cigarettes  . Smokeless tobacco: Current User  . Tobacco comment: pack of cigarettes last 1-2 months - doesnt inhale  Substance and Sexual Activity  . Alcohol use: Yes    Alcohol/week: 1.0 standard drinks    Types: 1 Cans of beer per week    Comment: 1-2 beers week   . Drug use: No  . Sexual activity: Not on file  Lifestyle  . Physical activity    Days per week: 0 days    Minutes per session: 0 min  . Stress: Not at all  Relationships  . Social connections    Talks on phone: More than three  times a week    Gets together: More than three times a week    Attends religious service: Never    Active member of club or organization: No    Attends meetings of clubs or organizations: Never    Relationship status: Divorced  . Intimate partner violence    Fear of current or ex partner: No    Emotionally abused: No    Physically abused: No    Forced sexual activity: No  Other Topics Concern  . Not on file  Social History Narrative   Working 1-2 days a week .    Family History  Problem Relation Age of Onset  . Cancer Brother        Throat   Current Outpatient Medications on File Prior to Visit  Medication Sig  . ACCU-CHEK AVIVA PLUS test strip  Check sugar up to 2 x daily  . Accu-Chek FastClix Lancets MISC Check sugar up to 2 x daily  . Accu-Chek Softclix Lancets lancets Check sugar up to 2 x daily  . acetaminophen (TYLENOL) 325 MG tablet Take 2 tablets (650 mg total) by mouth every 6 (six) hours as needed for mild pain or headache (fever >/= 101). (Patient not taking: Reported on 11/09/2018)  . Blood Glucose Monitoring Suppl (ACCU-CHEK AVIVA PLUS) w/Device KIT Use to check blood sugar up to x 2 daily  . Dulaglutide (TRULICITY) 1.5 VO/3.5KK SOPN Inject 1.5 mg into the skin once a week.  [name] glipiZIDE (GLUCOTROL) 5 MG tablet TAKE 1/2 TABLET(HALF TABLET) BY MOUTH DAILY BEFORE BREAKFAST  . lisinopril-hydrochlorothiazide (ZESTORETIC) 20-12.5 MG tablet Take 1 tablet by mouth daily.  . metFORMIN (GLUCOPHAGE) 1000 MG tablet TAKE 1 TABLET(1000 MG) BY MOUTH TWICE DAILY WITH A MEAL  . pravastatin (PRAVACHOL) 10 MG tablet TAKE 1 TABLET(10 MG) BY MOUTH DAILY  . Selenium Sulfide 2.25 % SHAM APPLY EXTERNALLY TO THE AFFECTED AREA DAILY AS NEEDED FOR IRRITATION (Patient not taking: Reported on 11/02/2018)  . tadalafil (ADCIRCA/CIALIS) 20 MG tablet Take 1 tablet (20 mg total) by mouth daily as needed for erectile dysfunction.   No current facility-administered medications on file prior to visit.     Review of Systems  Constitutional: Negative for activity change, appetite change, chills, diaphoresis, fatigue and fever.  HENT: Negative for congestion and hearing loss.   Eyes: Negative for visual disturbance.  Respiratory: Negative for cough, chest tightness, shortness of breath and wheezing.   Cardiovascular: Negative for chest pain, palpitations and leg swelling.  Gastrointestinal: Negative for abdominal pain, constipation, diarrhea, nausea and vomiting.  Genitourinary: Negative for dysuria, frequency and hematuria.  Musculoskeletal: Negative for arthralgias and neck pain.  Skin: Negative for rash.  Neurological: Negative for dizziness, weakness,  light-headedness, numbness and headaches.  Hematological: Negative for adenopathy.  Psychiatric/Behavioral: Negative for behavioral problems, dysphoric mood and sleep disturbance.   Per HPI unless specifically indicated above      Objective:    BP 128/77   Pulse 76   Temp 98.7 F (37.1 C) (Oral)   Resp 16   Ht 5' 11" (1.803 m)   Wt 184 lb (83.5 kg)   BMI 25.66 kg/m   Wt Readings from Last 3 Encounters:  12/10/18 184 lb (83.5 kg)  10/22/18 184 lb 11.9 oz (83.8 kg)  10/22/18 185 lb (83.9 kg)    Physical Exam Vitals signs and nursing note reviewed.  Constitutional:      General: He is not in acute distress.    Appearance: He is well-developed. He  is not diaphoretic.     Comments: Well-appearing, comfortable, cooperative  HENT:     Head: Normocephalic and atraumatic.  Eyes:     General:        Right eye: No discharge.        Left eye: No discharge.     Conjunctiva/sclera: Conjunctivae normal.     Pupils: Pupils are equal, round, and reactive to light.  Neck:     Musculoskeletal: Normal range of motion and neck supple.     Thyroid: No thyromegaly.  Cardiovascular:     Rate and Rhythm: Normal rate and regular rhythm.     Heart sounds: Normal heart sounds. No murmur.  Pulmonary:     Effort: Pulmonary effort is normal. No respiratory distress.     Breath sounds: Normal breath sounds. No wheezing or rales.  Abdominal:     General: Bowel sounds are normal. There is no distension.     Palpations: Abdomen is soft. There is no mass.     Tenderness: There is no abdominal tenderness.  Musculoskeletal: Normal range of motion.        General: No tenderness.     Comments: Upper / Lower Extremities: - Normal muscle tone, strength bilateral upper extremities 5/5, lower extremities 5/5  Lymphadenopathy:     Cervical: No cervical adenopathy.  Skin:    General: Skin is warm and dry.     Findings: No erythema or rash.  Neurological:     Mental Status: He is alert and oriented to  person, place, and time.     Comments: Distal sensation intact to light touch all extremities  Psychiatric:        Behavior: Behavior normal.     Comments: Well groomed, good eye contact, normal speech and thoughts    Results for orders placed or performed in visit on 12/03/18  PSA  Result Value Ref Range   PSA 0.3 < OR = 4.0 ng/mL  Lipid panel  Result Value Ref Range   Cholesterol 179 <200 mg/dL   HDL 80 > OR = 40 mg/dL   Triglycerides 72 <150 mg/dL   LDL Cholesterol (Calc) 84 mg/dL (calc)   Total CHOL/HDL Ratio 2.2 <5.0 (calc)   Non-HDL Cholesterol (Calc) 99 <130 mg/dL (calc)  COMPLETE METABOLIC PANEL WITH GFR  Result Value Ref Range   Glucose, Bld 128 (H) 65 - 99 mg/dL   BUN 12 7 - 25 mg/dL   Creat 1.03 0.70 - 1.18 mg/dL   GFR, Est Non African American 72 > OR = 60 mL/min/1.69m   GFR, Est African American 84 > OR = 60 mL/min/1.760m  BUN/Creatinine Ratio NOT APPLICABLE 6 - 22 (calc)   Sodium 138 135 - 146 mmol/L   Potassium 4.2 3.5 - 5.3 mmol/L   Chloride 102 98 - 110 mmol/L   CO2 24 20 - 32 mmol/L   Calcium 9.4 8.6 - 10.3 mg/dL   Total Protein 6.7 6.1 - 8.1 g/dL   Albumin 4.0 3.6 - 5.1 g/dL   Globulin 2.7 1.9 - 3.7 g/dL (calc)   AG Ratio 1.5 1.0 - 2.5 (calc)   Total Bilirubin 0.8 0.2 - 1.2 mg/dL   Alkaline phosphatase (APISO) 48 35 - 144 U/L   AST 16 10 - 35 U/L   ALT 12 9 - 46 U/L  CBC with Differential/Platelet  Result Value Ref Range   WBC 3.8 3.8 - 10.8 Thousand/uL   RBC 4.42 4.20 - 5.80 Million/uL   Hemoglobin 13.1 (L)  13.2 - 17.1 g/dL   HCT 40.4 38.5 - 50.0 %   MCV 91.4 80.0 - 100.0 fL   MCH 29.6 27.0 - 33.0 pg   MCHC 32.4 32.0 - 36.0 g/dL   RDW 13.4 11.0 - 15.0 %   Platelets 178 140 - 400 Thousand/uL   MPV 9.5 7.5 - 12.5 fL   Neutro Abs 1,721 1,500 - 7,800 cells/uL   Lymphs Abs 1,634 850 - 3,900 cells/uL   Absolute Monocytes 372 200 - 950 cells/uL   Eosinophils Absolute 42 15 - 500 cells/uL   Basophils Absolute 30 0 - 200 cells/uL   Neutrophils  Relative % 45.3 %   Total Lymphocyte 43.0 %   Monocytes Relative 9.8 %   Eosinophils Relative 1.1 %   Basophils Relative 0.8 %  Hemoglobin A1c  Result Value Ref Range   Hgb A1c MFr Bld 6.4 (H) <5.7 % of total Hgb   Mean Plasma Glucose 137 (calc)   eAG (mmol/L) 7.6 (calc)    Recent Labs    09/02/18 1342 10/23/18 0256 12/03/18 0847  HGBA1C 6.3* 6.3* 6.4*       Assessment & Plan:   Problem List Items Addressed This Visit    Hyperlipidemia associated with type 2 diabetes mellitus ([place])    Controlled cholesterol on statin lifestyle  Plan: 1. Continue current meds - Pravastatin 18m 2. Encourage improved lifestyle - low carb/cholesterol, reduce portion size, continue improving regular exercise      Essential hypertension    Well-controlled HTN  No known complications     Plan:  1.  Continue current BP regimen Lisinopril HCTZ 2. Encourage improved lifestyle - low sodium diet, regular exercise 3. Continue monitor BP outside office, bring readings to next visit, if persistently >140/90 or new symptoms notify office sooner      Diabetic retinopathy ([place]   Controlled type 2 diabetes mellitus with retinopathy ([place]    Well-controlled DM with A1c 6.4 now, x 3 readings in 6s No hypoglycemia Complications - DM retinopathy hyperlipidemia- increases risk of future cardiovascular complications   Plan:  1. Continue current therapy - Trulicity 11.6XWweekly, Metformin 10067mBID, Glipzide 2.93m27maily wc - caution hypoglycemia 2. Encourage improved lifestyle - low carb, low sugar diet, reduce portion size, continue improving regular exercise 3. Check CBG , bring log to next visit for review 4. Continue ACEi, Statin 5. Advised to schedule DM ophtho exam, send record 6. Follow-up 6 mo        Other Visit Diagnoses    Annual physical exam    -  Primary      Updated Health Maintenance information Reviewed recent lab results with patient Encouraged improvement to lifestyle with  diet and exercise - Goal of weight loss    No orders of the defined types were placed in this encounter.     Follow up plan: Return in about 6 months (around 06/09/2019) for 6 month follow-up DM A1c.  AleNobie PutnamO [facility] 12/10/2018, 9:53 AM

## 2018-12-10 NOTE — Assessment & Plan Note (Signed)
Well-controlled HTN  No known complications    Plan:  1.  Continue current BP regimen - Lisinopril-HCTZ 2. Encourage improved lifestyle - low sodium diet, regular exercise 3. Continue monitor BP outside office, bring readings to next visit, if persistently >140/90 or new symptoms notify office sooner 

## 2018-12-10 NOTE — Assessment & Plan Note (Signed)
Controlled cholesterol on statin lifestyle  Plan: 1. Continue current meds - Pravastatin 10mg 2. Encourage improved lifestyle - low carb/cholesterol, reduce portion size, continue improving regular exercise 

## 2018-12-10 NOTE — Assessment & Plan Note (Signed)
Well-controlled DM with A1c 6.4 now, x 3 readings in 6s No hypoglycemia Complications - DM retinopathy hyperlipidemia- increases risk of future cardiovascular complications   Plan:  1. Continue current therapy - Trulicity 1.5mg  weekly, Metformin 1000mg  BID, Glipzide 2.5mg  daily wc - caution hypoglycemia 2. Encourage improved lifestyle - low carb, low sugar diet, reduce portion size, continue improving regular exercise 3. Check CBG , bring log to next visit for review 4. Continue ACEi, Statin 5. Advised to schedule DM ophtho exam, send record 6. Follow-up 6 mo

## 2018-12-10 NOTE — Patient Instructions (Addendum)
Thank you for coming to the office today.  Recent Labs    09/02/18 1342 10/23/18 0256 12/03/18 0847  HGBA1C 6.3* 6.3* 6.4*     Your provider would like to you have your annual eye exam. Please contact your current eye doctor or here are some good options for you to contact.   [facility]   Address: [address] [place], [place] [address] Phone: [phone]  Website: [email or web address] [address], [place], [place] [address] Phone: [phone] [email or web address]  [facility]  Address: [place], [place], [place] [address] Phone: [phone]   [facility] [address] [place], [place] [address] Phone: [phone]  [facility] Address: [place], [place], [place] [address]  Phone: [phone]   Please schedule a Follow-up Appointment to: Return in about 6 months (around 06/09/2019) for 6 month follow-up DM A1c.  If you have any other questions or concerns, please feel free to call the office or send a message through [place]. You may also schedule an earlier appointment if necessary.  Additionally, you may be receiving a survey about your experience at our office within a few days to 1 week by e-mail or mail. We value your feedback.  Nobie [place], DO [place]

## 2018-12-16 ENCOUNTER — Ambulatory Visit: Payer: Self-pay | Admitting: Pharmacist

## 2018-12-16 ENCOUNTER — Telehealth: Payer: Self-pay

## 2018-12-16 NOTE — Chronic Care Management (AMB) (Signed)
  Chronic Care Management   Follow Up Note   12/16/2018 Name: [name]. MRN: [id number] DOB: Aug 23, 1945  Referred by: [name], DO Reason for referral : Chronic Care Management (Patient Phone Call)   [name]. is a 73 y.o. year old male who is a primary care patient of [name], DO. The CCM team was consulted for assistance with chronic disease management and care coordination needs.    I reached out to [name]. by phone today.  Patient reports that he is currently at work and asks that I call him back another day.   Plan  The care management team will reach out to the patient again over the next 30 days.   Harlow Asa, PharmD, [place] [employer] [phone]

## 2018-12-21 ENCOUNTER — Telehealth: Payer: Medicare HMO

## 2018-12-29 ENCOUNTER — Ambulatory Visit: Payer: Self-pay | Admitting: Pharmacist

## 2018-12-29 ENCOUNTER — Telehealth: Payer: Self-pay

## 2018-12-29 NOTE — Chronic Care Management (AMB) (Signed)
  Chronic Care Management   Follow Up Note   12/29/2018 Name: [name]. MRN: [id number] DOB: July 26, 1945  Referred by: [name], DO Reason for referral : Chronic Care Management (Patient Phone Call)   [name]. is a 73 y.o. year old male who is a primary care patient of [name], DO. The CCM team was consulted for assistance with chronic disease management and care coordination needs.    I reached out to [name]. by phone today.  Patient reports that his work schedule has changed. Reschedule call with patient.  Plan  Telephone follow up appointment with care management team member scheduled for: 12/17 at 3 pm  Harlow Asa, PharmD, [place] [phone]

## 2019-01-07 ENCOUNTER — Ambulatory Visit: Payer: Self-pay | Admitting: Pharmacist

## 2019-01-07 ENCOUNTER — Telehealth: Payer: Self-pay

## 2019-01-07 NOTE — Chronic Care Management (AMB) (Signed)
  Chronic Care Management   Follow Up Note   01/07/2019 Name: [name]. MRN: [id number] DOB: 1946-01-15  Referred by: [name], DO Reason for referral : Chronic Care Management (Patient Phone Call)   Cristin Szatkowski. is a 72 y.o. year old male who is a primary care patient of [name], DO. The CCM team was consulted for assistance with chronic disease management and care coordination needs.    Was unable to reach patient via telephone today and have left HIPAA compliant voicemail asking patient to return my call.  Plan  The care management team will reach out to the patient again over the next 30 days.   Harlow Asa, PharmD, [place] [employer] [phone]

## 2019-01-08 ENCOUNTER — Ambulatory Visit: Payer: Self-pay | Admitting: Pharmacist

## 2019-01-08 DIAGNOSIS — E113293 Type 2 diabetes mellitus with mild nonproliferative diabetic retinopathy without macular edema, bilateral: Secondary | ICD-10-CM

## 2019-01-08 NOTE — Patient Instructions (Signed)
Thank you allowing the Chronic Care Management Team to be a part of your care! It was a pleasure speaking with you today!     CCM (Chronic Care Management) Team    Janci Minor RN, BSN Nurse Care Coordinator  [phone]   Harlow Asa PharmD  Clinical Pharmacist  [phone]   Eula Fried LCSW Clinical Social Worker [phone]  Visit Information  Goals Addressed            This Visit's Progress   . PharmD - "I want to get my A1C down" (pt-stated)       Current Barriers:  [name] Medication Adherence  . Financial Barriers . Knowledge deficits related to nutrition with diabetes  Pharmacist Clinical Goal(s):  [name] Over the next 30 days, patient will continue to work with PharmD to address needs related to address needs related to diabetes management.  . Over the next 30 days, patient will demonstrate maintained medication adherence as evidenced by patient report of adherence  Interventions:   . Mr. Corvino reports he is doing well. States just leaving work now. Schedule appointment to review CBGs o Mr. Fyock denies any s/s of low blood sugars.  o Ask patient to call sooner for any medication concerns . Will collaborate with [facility] CPhT Susy Frizzle for patient assistance for Trulicity through [employer] for 2021.  Patient Self Care Activities:  . Self administers all medications as prescribed o Using weekly pillbox as adherence tool . To check blood sugars daily to twice daily and keep log. . To contact pharmacy to order refills of medication . Calls provider office for new concerns or questions. . Patient to eat regular meals  Please see past updates related to this goal by clicking on the "Past Updates" button in the selected goal         The patient verbalized understanding of instructions provided today and declined a print copy of patient instruction materials.   Telephone follow up appointment with care management team member scheduled for: 1/7 at 3  pm  Harlow Asa, PharmD, [place] [phone]

## 2019-01-08 NOTE — Chronic Care Management (AMB) (Signed)
Chronic Care Management   Follow Up Note   01/08/2019 Name: [name]. MRN: [id number] DOB: 1945/02/10  Referred by: [name], DO Reason for referral : Chronic Care Management (Patient Phone Call)   Antavious Spanos. is a 73 y.o. year old male who is a primary care patient of [name], DO. The CCM team was consulted for assistance with chronic disease management and care coordination needs.  Mr. Colgate has a past medical history including but not limited to: hypertension, type 2 diabetes and hyperlipidemia  Receive phone call from [name]. today.   Review of patient status, including review of consultants reports, relevant laboratory and other test results, and collaboration with appropriate care team members and the patient's provider was performed as part of comprehensive patient evaluation and provision of chronic care management services.     Outpatient Encounter Medications as of 01/08/2019  Medication Sig Note  . Dulaglutide (TRULICITY) 1.5 WU/9.8JX SOPN Inject 1.5 mg into the skin once a week. 11/19/2018: On Mondays  . ACCU-CHEK AVIVA PLUS test strip Check sugar up to 2 x daily   . Accu-Chek FastClix Lancets MISC Check sugar up to 2 x daily   . Accu-Chek Softclix Lancets lancets Check sugar up to 2 x daily   . acetaminophen (TYLENOL) 325 MG tablet Take 2 tablets (650 mg total) by mouth every 6 (six) hours as needed for mild pain or headache (fever >/= 101). (Patient not taking: Reported on 11/09/2018)   . Blood Glucose Monitoring Suppl (ACCU-CHEK AVIVA PLUS) w/Device KIT Use to check blood sugar up to x 2 daily   . glipiZIDE (GLUCOTROL) 5 MG tablet TAKE 1/2 TABLET(HALF TABLET) BY MOUTH DAILY BEFORE BREAKFAST   . lisinopril-hydrochlorothiazide (ZESTORETIC) 20-12.5 MG tablet Take 1 tablet by mouth daily.   . metFORMIN (GLUCOPHAGE) 1000 MG tablet TAKE 1 TABLET(1000 MG) BY MOUTH TWICE DAILY WITH A MEAL   . pravastatin (PRAVACHOL) 10 MG  tablet TAKE 1 TABLET(10 MG) BY MOUTH DAILY   . Selenium Sulfide 2.25 % SHAM APPLY EXTERNALLY TO THE AFFECTED AREA DAILY AS NEEDED FOR IRRITATION (Patient not taking: Reported on 11/02/2018)   . tadalafil (ADCIRCA/CIALIS) 20 MG tablet Take 1 tablet (20 mg total) by mouth daily as needed for erectile dysfunction.    No facility-administered encounter medications on file as of 01/08/2019.    Goals Addressed            This Visit's Progress   . PharmD - "I want to get my A1C down" (pt-stated)       Current Barriers:  [name] Medication Adherence  . Financial Barriers . Knowledge deficits related to nutrition with diabetes  Pharmacist Clinical Goal(s):  [name] Over the next 30 days, patient will continue to work with PharmD to address needs related to address needs related to diabetes management.  . Over the next 30 days, patient will demonstrate maintained medication adherence as evidenced by patient report of adherence  Interventions:   . Mr. Skalla reports he is doing well. States just leaving work now. Schedule appointment to review CBGs o Mr. Gorelik denies any s/s of low blood sugars.  o Ask patient to call sooner for any medication concerns . Will collaborate with [facility] CPhT Susy Frizzle for patient assistance for Trulicity through [employer] for 2021.  Patient Self Care Activities:  . Self administers all medications as prescribed o Using weekly pillbox as adherence tool . To check blood sugars daily to twice daily and keep  log. . To contact pharmacy to order refills of medication . Calls provider office for new concerns or questions. . Patient to eat regular meals  Please see past updates related to this goal by clicking on the "Past Updates" button in the selected goal         Plan  Telephone follow up appointment with care management team member scheduled for: 1/7 at 3 pm  Harlow Asa, PharmD, [place] [phone]

## 2019-01-20 ENCOUNTER — Other Ambulatory Visit: Payer: Self-pay | Admitting: Pharmacy Technician

## 2019-01-20 NOTE — Patient Outreach (Signed)
[name] [facility]) Care Management  01/20/2019  [name] July 05, 1945 [id number]                                      Medication Assistance Referral  Referral From: [place] RPh Dorthula Perfect   Medication/Company: [name] / [name] Patient application portion:  Mailed Provider application portion: Faxed  to [name] Provider address/fax verified via: Office website     Follow up:  Will follow up with patient in 5-10 business days to confirm application(s) have been received.  [name] [name], [place] Management [phone]

## 2019-01-28 ENCOUNTER — Telehealth: Payer: Self-pay

## 2019-01-28 ENCOUNTER — Ambulatory Visit (INDEPENDENT_AMBULATORY_CARE_PROVIDER_SITE_OTHER): Payer: Medicare HMO | Admitting: Pharmacist

## 2019-01-28 DIAGNOSIS — E785 Hyperlipidemia, unspecified: Secondary | ICD-10-CM

## 2019-01-28 DIAGNOSIS — E1169 Type 2 diabetes mellitus with other specified complication: Secondary | ICD-10-CM

## 2019-01-28 DIAGNOSIS — E113293 Type 2 diabetes mellitus with mild nonproliferative diabetic retinopathy without macular edema, bilateral: Secondary | ICD-10-CM

## 2019-01-28 DIAGNOSIS — I10 Essential (primary) hypertension: Secondary | ICD-10-CM | POA: Diagnosis not present

## 2019-01-28 NOTE — Chronic Care Management (AMB) (Signed)
Chronic Care Management   Follow Up Note   01/28/2019 Name: [name]. MRN: [id number] DOB: 09-Aug-1945  Referred by: [name], DO Reason for referral : Chronic Care Management (Patient Phone Call)   [name]. is a 74 y.o. year old male who is a primary care patient of [name], DO. The CCM team was consulted for assistance with chronic disease management and care coordination needs. [name] has a past medical history including but not limited to: hypertension, type 2 diabetes and hyperlipidemia  I reached out to [name]. by phone today.  Review of patient status, including review of consultants reports, relevant laboratory and other test results, and collaboration with appropriate care team members and the patient's provider was performed as part of comprehensive patient evaluation and provision of chronic care management services.     Outpatient Encounter Medications as of 01/28/2019  Medication Sig Note  . Dulaglutide (TRULICITY) 1.5 AE/4.9PN SOPN Inject 1.5 mg into the skin once a week. 11/19/2018: On Mondays  . glipiZIDE (GLUCOTROL) 5 MG tablet TAKE 1/2 TABLET(HALF TABLET) BY MOUTH DAILY BEFORE BREAKFAST   . lisinopril-hydrochlorothiazide (ZESTORETIC) 20-12.5 MG tablet Take 1 tablet by mouth daily.   . metFORMIN (GLUCOPHAGE) 1000 MG tablet TAKE 1 TABLET(1000 MG) BY MOUTH TWICE DAILY WITH A MEAL   . pravastatin (PRAVACHOL) 10 MG tablet TAKE 1 TABLET(10 MG) BY MOUTH DAILY   . ACCU-CHEK AVIVA PLUS test strip Check sugar up to 2 x daily   . Accu-Chek FastClix Lancets MISC Check sugar up to 2 x daily   . Accu-Chek Softclix Lancets lancets Check sugar up to 2 x daily   . acetaminophen (TYLENOL) 325 MG tablet Take 2 tablets (650 mg total) by mouth every 6 (six) hours as needed for mild pain or headache (fever >/= 101). (Patient not taking: Reported on 11/09/2018)   . Blood Glucose Monitoring Suppl (ACCU-CHEK AVIVA PLUS) w/Device KIT Use to  check blood sugar up to x 2 daily   . Selenium Sulfide 2.25 % SHAM APPLY EXTERNALLY TO THE AFFECTED AREA DAILY AS NEEDED FOR IRRITATION (Patient not taking: Reported on 11/02/2018)   . tadalafil (ADCIRCA/CIALIS) 20 MG tablet Take 1 tablet (20 mg total) by mouth daily as needed for erectile dysfunction.    No facility-administered encounter medications on file as of 01/28/2019.    Goals Addressed   Current Barriers:  [name] Medication Adherence  . Financial Barriers . Knowledge deficits related to nutrition with diabetes  Pharmacist Clinical Goal(s):  [name] Over the next 30 days, patient will continue to work with PharmD to address needs related to address needs related to diabetes management.  . Over the next 30 days, patient will demonstrate maintained medication adherence as evidenced by patient report of adherence  Interventions:   . Counsel on importance of blood sugar control and monitoring o Mr. Defoor reports taking:  Metformin 1000 mg twice daily  Trulicity 1.5 mg weekly  Glipizide 5 mg - 1/2 tablet (2.5 mg) daily o Review recent blood sugar results (see below)  Denies any low blood sugars  Counsel on symptoms of low blood sugar o Counsel on importance of eating regular and balanced meals as well as limiting carbohydrate portion sizes  Provide verbal education on sources of carbohydrates . Counsel on importance of medication adherence o Confirms continuing to use weekly pillbox adherence tool. Denies any missed doses o Confirms ordered refill of Trulicity prior to end of 2020 calendar year enrollment o Remind  to reorder testing supplies from [facility] order as needed o Identifies needing refill of pravastatin. States he will call pharmacy today. . Will continue to collaborate with [facility] CPhT Susy Frizzle for patient assistance for Trulicity through [employer] for 2021. o Confirms received application for 3700 program re-enrollment in mail today. Denies any questions about  application and confirms has contact information for [facility] CPhT  Patient Self Care Activities:  . Self administers all medications as prescribed o Using weekly pillbox as adherence tool . To check blood sugars daily to twice daily and keep log. Date Fasting Blood Glucose After Breakfast After Lunch  1 - January 137    2 - January     3 - January     4 - January  172   5 - January 122  218  6 - January     7 - January 143     . To contact pharmacy to order refills of medication . Calls provider office for new concerns or questions. . Patient to eat regular meals  Please see past updates related to this goal by clicking on the "Past Updates" button in the selected goal        Plan  Telephone follow up appointment with care management team member scheduled for: 2/4 at 3 pm  Harlow Asa, PharmD, [place] [phone]

## 2019-01-28 NOTE — Patient Instructions (Signed)
Thank you allowing the Chronic Care Management Team to be a part of your care! It was a pleasure speaking with you today!     CCM (Chronic Care Management) Team    Janci Minor RN, BSN Nurse Care Coordinator  [phone]   [name] PharmD  Clinical Pharmacist  [phone]   Dickie La LCSW Clinical Social Worker [phone]  Visit Information  Goals Addressed   Current Barriers:  Medication Adherence  Financial Barriers  Knowledge deficits related to nutrition with diabetes Pharmacist Clinical Goal(s):  Over the next 30 days, patient will continue to work with PharmD to address needs related to address needs related to diabetes management.  Over the next 30 days, patient will demonstrate maintained medication adherence as evidenced by patient report of adherence Interventions:  Counsel on importance of blood sugar control and monitoring  Mr. Stutsman reports taking:  Metformin 1000 mg twice daily  Trulicity 1.5 mg weekly  Glipizide 5 mg - 1/2 tablet (2.5 mg) daily Review recent blood sugar results (see below)  Denies any low blood sugars  Counsel on symptoms of low blood sugar Counsel on importance of eating regular and balanced meals as well as limiting carbohydrate portion sizes  Provide verbal education on sources of carbohydrates Counsel on importance of medication adherence  Confirms continuing to use weekly pillbox adherence tool. Denies any missed doses  Confirms ordered refill of Trulicity prior to end of 2020 calendar year enrollment  Remind to reorder testing supplies from [facility] order as needed  Identifies needing refill of pravastatin. States he will call pharmacy today. Will continue to collaborate with [facility] CPhT Pattricia Boss for patient assistance for Trulicity through [employer] for 2021.  Confirms received application for 2021 program re-enrollment in mail today. Denies any questions about application and confirms has contact information  for [facility] CPhT Patient Self Care Activities:  Self administers all medications as prescribed  Using weekly pillbox as adherence tool To check blood sugars daily to twice daily and keep log. Date Fasting Blood Glucose After Breakfast After Lunch  1 - January 137    2 - January     3 - January     4 - January  172   5 - January 122  218  6 - January     7 - January 143    To contact pharmacy to order refills of medication Calls provider office for new concerns or questions. Patient to eat regular meals Please see past updates related to this goal by clicking on the "Past Updates" button in the selected goal       The patient verbalized understanding of instructions provided today and declined a print copy of patient instruction materials.   Telephone follow up appointment with care management team member scheduled for: 2/4 at 3 pm  [name], PharmD, [facility] Clinical Pharmacist [facility] Medical [employer] [phone]

## 2019-01-29 ENCOUNTER — Telehealth: Payer: Self-pay

## 2019-02-01 ENCOUNTER — Other Ambulatory Visit: Payer: Self-pay | Admitting: Pharmacy Technician

## 2019-02-01 NOTE — Patient Outreach (Signed)
[facility]) Care Management  02/01/2019  [name]. 1945/10/07 [id number]      Incoming inbasket from embedded [facility] RPh regarding patient assistance application(s) for Trulicity with [name] , Received the following inbasket message.   [name] confirmed that he received the Trulicity PAP application yesterday, denied any current questions and confirmed he had your phone number from your business card.   Thank you!   [name]    Follow up:  Will route note to embedded [facility] RPh [name] for case closure if document(s) have not been received in the next 15 business days.  Marlayna Bannister P. Royal Beirne, CPhT Musician Care Management [phone]

## 2019-02-04 ENCOUNTER — Telehealth: Payer: Self-pay

## 2019-02-04 NOTE — Telephone Encounter (Signed)
error 

## 2019-02-05 ENCOUNTER — Other Ambulatory Visit: Payer: Self-pay | Admitting: Pharmacy Technician

## 2019-02-05 NOTE — Patient Outreach (Signed)
[facility]) Care Management  02/05/2019  [name]. 1945/08/24 [id number]   Unsuccessful follow up call placed to patient in regards to voicemail he left me in regards to [name] application for Trulicity.  Unfortunately patient did not answer the phone, HIPAA compliant voicemail was left.  Will await return call and  follow up with patient as previously scheduled.  Alanii Ramer P. Adra Shepler, CPhT Musician Care Management [phone]

## 2019-02-08 ENCOUNTER — Other Ambulatory Visit: Payer: Self-pay | Admitting: Pharmacy Technician

## 2019-02-08 NOTE — Patient Outreach (Signed)
[facility]) Care Management  02/08/2019  Dornell Grasmick. 16-Jul-1945 [id number]   Incoming call received from patient in regards to [facility] application for Trulicity.  Spoke to patient, HIPAA identifiers verified.  Patient informed he had a question about the income question on the application. Was able to provide an answer for the patient and he verbalized understanding. He informed he would place in the mail to me this week.  Will route note to embedded [facility] RPh [name] if application has not been received after 15 business days.  Cambri Plourde P. Joaquina Nissen, CPhT Musician Care Management [phone]

## 2019-02-11 ENCOUNTER — Telehealth: Payer: Self-pay | Admitting: Family Medicine

## 2019-02-11 ENCOUNTER — Other Ambulatory Visit: Payer: Self-pay | Admitting: Pharmacy Technician

## 2019-02-11 DIAGNOSIS — N529 Male erectile dysfunction, unspecified: Secondary | ICD-10-CM

## 2019-02-11 MED ORDER — SILDENAFIL CITRATE 20 MG PO TABS: ORAL_TABLET | ORAL | 2 refills | Status: DC

## 2019-02-11 NOTE — Patient Outreach (Signed)
[facility]) Care Management  02/11/2019  Jerald Hennington. September 28, 1945 [id number]   Received both patient and provider portion(s) of patient assistance application(s) for Trulicity. Faxed completed application and required documents into [name].  Will follow up with company(ies) in 7-10 business days to check status of application(s).  Francessca Friis P. Lanya Bucks, CPhT Musician Care Management [phone]

## 2019-02-11 NOTE — Telephone Encounter (Signed)
Pt. Called requesting refill sildenafil Bridget Hartshorn

## 2019-02-24 ENCOUNTER — Other Ambulatory Visit: Payer: Self-pay | Admitting: Pharmacy Technician

## 2019-02-24 NOTE — Patient Outreach (Signed)
[facility]) Care Management  02/24/2019  Asir Bingley. 04-Jun-1945 [id number]    Care coordination call placed to [name] in regards to patient's application for Trulicity.  Spoke to [employer] who was able to locate the application in the que. She informed she would send it to processing but had no specific timeline as to when the application would be processed.  Will follow up with [name] in 3-7 business days.  Maisey Deandrade P. Ormand Senn, CPhT Musician Care Management [phone]

## 2019-02-25 ENCOUNTER — Ambulatory Visit: Payer: Self-pay | Admitting: Pharmacist

## 2019-02-25 ENCOUNTER — Telehealth: Payer: Self-pay

## 2019-02-25 NOTE — Chronic Care Management (AMB) (Signed)
  Chronic Care Management   Follow Up Note   02/25/2019 Name: [name]. MRN: [id number] DOB: [birth year]  Referred by: [name], DO Reason for referral : Chronic Care Management (Patient Phone Call)   [name]. is a 74 y.o. year old male who is a primary care patient of [name], DO. The CCM team was consulted for assistance with chronic disease management and care coordination needs.    Was unable to reach patient via telephone today and have left HIPAA compliant voicemail asking patient to return my call.   Plan  The care management team will reach out to the patient again over the next 30 days.   [name], PharmD, [facility] Clinical Pharmacist [facility] Medical [employer] [phone]

## 2019-02-26 ENCOUNTER — Other Ambulatory Visit: Payer: Self-pay | Admitting: Pharmacy Technician

## 2019-02-26 NOTE — Patient Outreach (Signed)
[facility]) Care Management  02/26/2019  [name]. 12/02/1945 [id number]   Care coordination call placed to [name] in regards to patient's application for Trulicity.  Spoke to [place] who informed patient's account says inactive. Informed Marchelle Folks that the application was faxed on 02/11/2019. After providing the fax number from which the application was faxed, Marchelle Folks was able to locate the application. She informed she was confused as the application was attached to his file but his account remains inactive. She informed after reviewing the documents that page 5, the prescription page of the application, was hard to read and requested that it be ref axed into [place]. She informed that she was going to send the information to the processing team as well so that his account can be updated to an active status.  Will refax document when back in the office next week and will follow up with [name] in 3-7 business days to inquire if a determination has been made.  Aadon Gorelik P. Byrne Capek, CPhT Musician Care Management [phone]

## 2019-03-02 ENCOUNTER — Ambulatory Visit (INDEPENDENT_AMBULATORY_CARE_PROVIDER_SITE_OTHER): Payer: Medicare HMO | Admitting: Licensed Clinical Social Worker

## 2019-03-02 ENCOUNTER — Other Ambulatory Visit: Payer: Self-pay | Admitting: Pharmacy Technician

## 2019-03-02 DIAGNOSIS — E113293 Type 2 diabetes mellitus with mild nonproliferative diabetic retinopathy without macular edema, bilateral: Secondary | ICD-10-CM

## 2019-03-02 DIAGNOSIS — I10 Essential (primary) hypertension: Secondary | ICD-10-CM

## 2019-03-02 NOTE — Patient Outreach (Signed)
[facility]) Care Management  03/02/2019  [name]. [birth year] [id number]    Care coordination call placed to [name] in regards to patient's application for Trulicity.  Spoke to [name] who informed they received the application but it still is in the processing phase. She informed to check back in a few days.  Will follow up with [name] in 7-10 business days.  [name] P. [name], CPhT Musician Care Management [phone]

## 2019-03-02 NOTE — Chronic Care Management (AMB) (Signed)
Chronic Care Management    Clinical Social Work Follow Up Note  03/02/2019 Name: [name]. MRN: [id number] DOB: 01-15-1946  [name]. is a 74 y.o. year old male who is a primary care patient of [name], DO. The CCM team was consulted for assistance with Caregiver Stress.   Review of patient status, including review of consultants reports, other relevant assessments, and collaboration with appropriate care team members and the patient's provider was performed as part of comprehensive patient evaluation and provision of chronic care management services.    Advanced Directives Status: <no information> See Care Plan for related entries.   Outpatient Encounter Medications as of 03/02/2019  Medication Sig Note  . ACCU-CHEK AVIVA PLUS test strip Check sugar up to 2 x daily   . Accu-Chek FastClix Lancets MISC Check sugar up to 2 x daily   . Accu-Chek Softclix Lancets lancets Check sugar up to 2 x daily   . acetaminophen (TYLENOL) 325 MG tablet Take 2 tablets (650 mg total) by mouth every 6 (six) hours as needed for mild pain or headache (fever >/= 101). (Patient not taking: Reported on 11/09/2018)   . Blood Glucose Monitoring Suppl (ACCU-CHEK AVIVA PLUS) w/Device KIT Use to check blood sugar up to x 2 daily   . Dulaglutide (TRULICITY) 1.5 AS/5.0NL SOPN Inject 1.5 mg into the skin once a week. 11/19/2018: On Mondays  . glipiZIDE (GLUCOTROL) 5 MG tablet TAKE 1/2 TABLET(HALF TABLET) BY MOUTH DAILY BEFORE BREAKFAST   . lisinopril-hydrochlorothiazide (ZESTORETIC) 20-12.5 MG tablet Take 1 tablet by mouth daily.   . metFORMIN (GLUCOPHAGE) 1000 MG tablet TAKE 1 TABLET(1000 MG) BY MOUTH TWICE DAILY WITH A MEAL   . pravastatin (PRAVACHOL) 10 MG tablet TAKE 1 TABLET(10 MG) BY MOUTH DAILY   . Selenium Sulfide 2.25 % SHAM APPLY EXTERNALLY TO THE AFFECTED AREA DAILY AS NEEDED FOR IRRITATION (Patient not taking: Reported on 11/02/2018)   . sildenafil (REVATIO) 20 MG tablet Take 2-5  pills about 30 min prior to sex.   . tadalafil (ADCIRCA/CIALIS) 20 MG tablet Take 1 tablet (20 mg total) by mouth daily as needed for erectile dysfunction.    No facility-administered encounter medications on file as of 03/02/2019.     Goals Addressed    . "I need more help in the home because I live alone." (pt-stated)       Current Barriers:  . Financial constraints related to affording a caregiver and managing health care expenses . Limited social support . ADL IADL limitations . Social Isolation . Limited access to caregiver  Clinical Social Work Clinical Goal(s):  [name] Over the next 120 days, patient will work with SW to address concerns related to gaining financial and personal care service resource education  Interventions: . Patient interviewed and appropriate assessments performed . Discussed plans with patient for ongoing care management follow up and provided patient with direct contact information for care management team . Assisted patient/caregiver with obtaining information about health plan benefits . A voluntary and extensive discussion about advanced care planning including explanation and discussion of advanced was undertaken with the patient. Explanation regarding healthcare proxy and living will was reviewed and packet with forms with explanation of how to fill them out was given.   [name] Resource education provided on [employer] and [employer]. . Patient interested in being put on the wait list for C.H.OR.E. LCSW will complete this referral within 45 days. UPDATE-- C.H.O.R.E referral submitted on 03/02/2019.  Patient Self Care Activities:  . Attends all scheduled provider appointments . Calls provider office for new concerns or questions .  [name] Unable to independently self pay for personal care services   Please see past updates related to this goal by clicking on the "Past Updates" button in the selected goal      Follow Up  Plan: SW will follow up with patient by phone over the next quarter  Eula Fried, [place], MSW, [email or web address] Phone: [phone]

## 2019-03-09 ENCOUNTER — Other Ambulatory Visit: Payer: Self-pay | Admitting: Pharmacy Technician

## 2019-03-09 NOTE — Patient Outreach (Signed)
[facility]) Care Management  03/09/2019  [name]. Jun 15, 1945 [id number]    Care coordination call placed to [facility] in regards to patient's application for Trulicity.  Spoke to [place] who informed they received the fax. He informed that he would send it for processing. He informed he had not time line as to when that process would be complete.  Will follow up with [name] in 5-7 business days.  [name] P. Dodge Ator, CPhT Musician Care Management [phone]

## 2019-03-09 NOTE — Patient Outreach (Signed)
[facility]) Care Management  03/09/2019  [name]. [birth year] [id number]    Return call placed to patient regarding patient assistance application(s) for Trulicity with [name] , HIPAA compliant voicemail left.   Was returning patient's call, unfortunately he did not answer. HIPAA compliant message left.  Follow up:  Will await a return call.  Emmarie Sannes P. Aiven Kampe, CPhT Musician Care Management [phone]

## 2019-03-11 ENCOUNTER — Telehealth: Payer: Self-pay

## 2019-03-11 ENCOUNTER — Ambulatory Visit: Payer: Self-pay | Admitting: Pharmacist

## 2019-03-11 DIAGNOSIS — E113293 Type 2 diabetes mellitus with mild nonproliferative diabetic retinopathy without macular edema, bilateral: Secondary | ICD-10-CM

## 2019-03-11 DIAGNOSIS — I10 Essential (primary) hypertension: Secondary | ICD-10-CM

## 2019-03-11 NOTE — Patient Instructions (Signed)
Thank you allowing the Chronic Care Management Team to be a part of your care! It was a pleasure speaking with you today!     CCM (Chronic Care Management) Team    Noreene Larsson RN, MSN, CCM Nurse Care Coordinator  [phone]   Harlow Asa PharmD  Clinical Pharmacist  [phone]   Eula Fried LCSW Clinical Social Worker [phone]  Visit Information  Goals Addressed            This Visit's Progress   . PharmD - "I want to get my A1C down" (pt-stated)       CARE PLAN ENTRY (see longtitudinal plan of care for additional care plan information)  Current Barriers:  [name] Medication Adherence  . Financial Barriers . Knowledge deficits related to nutrition with diabetes  Pharmacist Clinical Goal(s):  [name] Over the next 30 days, patient will continue to work with PharmD to address needs related to address needs related to diabetes management.  . Over the next 30 days, patient will demonstrate maintained medication adherence as evidenced by patient report of adherence  Interventions:   . Counsel on importance of blood sugar control and monitoring o Patient reports taking:  Metformin 1000 mg twice daily  Trulicity 1.5 mg weekly  Glipizide 5 mg - 1/2 tablet (2.5 mg) daily o Review recent blood sugar results (see below)  Denies any low blood sugars  Counsel on symptoms of low blood sugar o Counsel on importance of eating regular and balanced meals as well as limiting carbohydrate portion sizes . Counsel on importance of medication adherence o Confirms continuing to use weekly pillbox adherence tool. Denies any missed doses o Reports used last pen of Trulicity last Monday; next injection due on 2/22 . Counsel patient on medication assistance, health plan formulary and options for reducing medication cost. . Collaborating with [place] for patient assistance for Trulicity through [employer] for 2021. o Note [facility] CPhT last contacted [name] on 4/27 -  application status: processing . Mr. Slusher states that he is planning to receive 1st dose of COVID-19 vaccine on 3/4. [name] Place coordination of care call to [place] today. Speak with representative [place]. Request application status be changed to urgent, as patient is now out of medication. [name] Collaborate with office regarding availability of Trulicity sample for patient. Nisha, CMA, will check availabliity and follow up with patient. Roney Marion with PCP.  Patient Self Care Activities:  . Self administers all medications as prescribed o Using weekly pillbox as adherence tool . To check blood sugars daily to twice daily and keep log. Date Fasting Blood Glucose After breakfast Bedtime Notes  9 - February  139  *Believe had large portion of carbohydrate for supper  10 - February 202*  103   11 - February  118    12 - February 105     13 - February      14 - February      16 - February 117     17 - February   90   18 - February 124      . To contact pharmacy to order refills of medication . Calls provider office for new concerns or questions. . Patient to eat regular meals  Please see past updates related to this goal by clicking on the "Past Updates" button in the selected goal         The patient verbalized understanding of instructions provided today and declined a print copy of  patient Paramedic.   Telephone follow up appointment with care management team member scheduled for: 3/3 at 8:30 am  [name], PharmD, [facility] Clinical Pharmacist [facility] [phone]

## 2019-03-11 NOTE — Chronic Care Management (AMB) (Signed)
Chronic Care Management   Follow Up Note   03/11/2019 Name: [name]. MRN: [id number] DOB: 1945-02-15  Referred by: [name], DO Reason for referral : Chronic Care Management (Patient Phone Call) and Care Coordination ([name])   [name]. is a 74 y.o. year old male who is a primary care patient of [name], DO. The CCM team was consulted for assistance with chronic disease management and care coordination needs.  [name] has a past medical history including but not limited to: hypertension, type 2 diabetes and hyperlipidemia  I reached out to [name]. by phone today.   Review of patient status, including review of consultants reports, relevant laboratory and other test results, and collaboration with appropriate care team members and the patient's provider was performed as part of comprehensive patient evaluation and provision of chronic care management services.     Outpatient Encounter Medications as of 03/11/2019  Medication Sig Note  . Dulaglutide (TRULICITY) 1.5 WU/9.8JX SOPN Inject 1.5 mg into the skin once a week. 11/19/2018: On Mondays  . glipiZIDE (GLUCOTROL) 5 MG tablet TAKE 1/2 TABLET(HALF TABLET) BY MOUTH DAILY BEFORE BREAKFAST   . lisinopril-hydrochlorothiazide (ZESTORETIC) 20-12.5 MG tablet Take 1 tablet by mouth daily.   . metFORMIN (GLUCOPHAGE) 1000 MG tablet TAKE 1 TABLET(1000 MG) BY MOUTH TWICE DAILY WITH A MEAL   . pravastatin (PRAVACHOL) 10 MG tablet TAKE 1 TABLET(10 MG) BY MOUTH DAILY   . ACCU-CHEK AVIVA PLUS test strip Check sugar up to 2 x daily   . Accu-Chek FastClix Lancets MISC Check sugar up to 2 x daily   . Accu-Chek Softclix Lancets lancets Check sugar up to 2 x daily   . acetaminophen (TYLENOL) 325 MG tablet Take 2 tablets (650 mg total) by mouth every 6 (six) hours as needed for mild pain or headache (fever >/= 101). (Patient not taking: Reported on 11/09/2018)   . Blood Glucose  Monitoring Suppl (ACCU-CHEK AVIVA PLUS) w/Device KIT Use to check blood sugar up to x 2 daily   . Selenium Sulfide 2.25 % SHAM APPLY EXTERNALLY TO THE AFFECTED AREA DAILY AS NEEDED FOR IRRITATION (Patient not taking: Reported on 11/02/2018)   . sildenafil (REVATIO) 20 MG tablet Take 2-5 pills about 30 min prior to sex. (Patient not taking: Reported on 03/11/2019)   . tadalafil (ADCIRCA/CIALIS) 20 MG tablet Take 1 tablet (20 mg total) by mouth daily as needed for erectile dysfunction. (Patient not taking: Reported on 03/11/2019)    No facility-administered encounter medications on file as of 03/11/2019.    Goals Addressed            This Visit's Progress   . PharmD - "I want to get my A1C down" (pt-stated)       CARE PLAN ENTRY (see longtitudinal plan of care for additional care plan information)  Current Barriers:  [name] Medication Adherence  . Financial Barriers . Knowledge deficits related to nutrition with diabetes  Pharmacist Clinical Goal(s):  [name] Over the next 30 days, patient will continue to work with PharmD to address needs related to address needs related to diabetes management.  . Over the next 30 days, patient will demonstrate maintained medication adherence as evidenced by patient report of adherence  Interventions:   . Counsel on importance of blood sugar control and monitoring o Patient reports taking:  Metformin 1000 mg twice daily  Trulicity 1.5 mg weekly  Glipizide 5 mg - 1/2 tablet (2.5 mg) daily o Review  recent blood sugar results (see below)  Denies any low blood sugars  Counsel on symptoms of low blood sugar o Counsel on importance of eating regular and balanced meals as well as limiting carbohydrate portion sizes . Counsel on importance of medication adherence o Confirms continuing to use weekly pillbox adherence tool. Denies any missed doses o Reports used last pen of Trulicity last Monday; next injection due on 2/22 . Counsel patient on medication  assistance, health plan formulary and options for reducing medication cost. . Collaborating with [name] for patient assistance for Trulicity through [employer] for 2021. o Note [facility] last contacted [name] on 8/67 - application status: processing . [name] states that he is planning to receive 1st dose of COVID-19 vaccine on 3/4. [name] Place coordination of care call to [place] today. Speak with representative [name]. Request application status be changed to urgent, as patient is now out of medication. [name] Collaborate with office regarding availability of Trulicity sample for patient. [name], CMA, will check availabliity and follow up with patient. [name] with PCP.  Patient Self Care Activities:  . Self administers all medications as prescribed o Using weekly pillbox as adherence tool . To check blood sugars daily to twice daily and keep log. Date Fasting Blood Glucose After breakfast Bedtime Notes  9 - February  139  *Believe had large portion of carbohydrate for supper  10 - February 202*  103   11 - February  118    12 - February 105     13 - February      14 - February      16 - February 117     17 - February   90   18 - February 124      . To contact pharmacy to order refills of medication . Calls provider office for new concerns or questions. . Patient to eat regular meals  Please see past updates related to this goal by clicking on the "Past Updates" button in the selected goal         Plan  Telephone follow up appointment with care management team member scheduled for: 3/3 at 8:30 am  Harlow Asa, PharmD, [place] [phone]

## 2019-03-15 ENCOUNTER — Other Ambulatory Visit: Payer: Self-pay | Admitting: Pharmacy Technician

## 2019-03-15 NOTE — Patient Outreach (Signed)
[facility]) Care Management  03/15/2019  Keyaan Lederman. 10-Aug-1945 [id number]    Unsuccessful call placed to patient regarding patient assistance application(s) for Trulicity with [name] , HIPAA compliant voicemail left.   Was calling to inform patient of his approval into the [name] patient assistance program and to provide him with the phone number to call to place his refills which is [phone].  Will route note to embedded [facility] RPh [name] to notify patient of this information at her next scheduled outreach call with him.  Follow up:  Will route note to embedded [facility] RPh [name] for case closure and will remove myself from care team.  Stacie Acres. Kendalynn Wideman, CPhT [facility] Care [facility] [phone]

## 2019-03-15 NOTE — Patient Outreach (Signed)
[facility]) Care Management  03/15/2019  [name]. 1945/11/23 [id number]  Care coordination call placed to [name] in regards to patient's Trulicity application.  Spoke to [place] who informed patient was APPROVED 03/12/2019-01/21/2020. He informed the request was sent to the pharmacy on 03/12/2019. He informed if patient is in need of a refill then he could call into [name] at [phone].  Will outreach patient with this information.  Inge Waldroup P. Serra Younan, CPhT [facility] Management [phone]

## 2019-03-24 ENCOUNTER — Ambulatory Visit: Payer: Self-pay | Admitting: Pharmacist

## 2019-03-24 DIAGNOSIS — E113293 Type 2 diabetes mellitus with mild nonproliferative diabetic retinopathy without macular edema, bilateral: Secondary | ICD-10-CM

## 2019-03-24 NOTE — Chronic Care Management (AMB) (Signed)
Chronic Care Management   Follow Up Note   03/24/2019 Name: [name]. MRN: [id number] DOB: February 26, 1945  Referred by: [name], DO Reason for referral : Chronic Care Management (Patient Phone Call)   [name]. is a 74 y.o. year old male who is a primary care patient of [name], DO. The CCM team was consulted for assistance with chronic disease management and care coordination needs.  [name] has a past medical history including but not limited to: hypertension, type 2 diabetes and hyperlipidemia  I reached out to [name]. by phone today.   Review of patient status, including review of consultants reports, relevant laboratory and other test results, and collaboration with appropriate care team members and the patient's provider was performed as part of comprehensive patient evaluation and provision of chronic care management services.      Outpatient Encounter Medications as of 03/24/2019  Medication Sig Note  . Dulaglutide (TRULICITY) 1.5 EX/5.2WU SOPN Inject 1.5 mg into the skin once a week. 11/19/2018: On Mondays  . glipiZIDE (GLUCOTROL) 5 MG tablet TAKE 1/2 TABLET(HALF TABLET) BY MOUTH DAILY BEFORE BREAKFAST   . metFORMIN (GLUCOPHAGE) 1000 MG tablet TAKE 1 TABLET(1000 MG) BY MOUTH TWICE DAILY WITH A MEAL   . ACCU-CHEK AVIVA PLUS test strip Check sugar up to 2 x daily   . Accu-Chek FastClix Lancets MISC Check sugar up to 2 x daily   . Accu-Chek Softclix Lancets lancets Check sugar up to 2 x daily   . acetaminophen (TYLENOL) 325 MG tablet Take 2 tablets (650 mg total) by mouth every 6 (six) hours as needed for mild pain or headache (fever >/= 101). (Patient not taking: Reported on 11/09/2018)   . Blood Glucose Monitoring Suppl (ACCU-CHEK AVIVA PLUS) w/Device KIT Use to check blood sugar up to x 2 daily   . lisinopril-hydrochlorothiazide (ZESTORETIC) 20-12.5 MG tablet Take 1 tablet by mouth daily.   . pravastatin (PRAVACHOL) 10 MG  tablet TAKE 1 TABLET(10 MG) BY MOUTH DAILY   . Selenium Sulfide 2.25 % SHAM APPLY EXTERNALLY TO THE AFFECTED AREA DAILY AS NEEDED FOR IRRITATION (Patient not taking: Reported on 11/02/2018)   . sildenafil (REVATIO) 20 MG tablet Take 2-5 pills about 30 min prior to sex. (Patient not taking: Reported on 03/11/2019)   . tadalafil (ADCIRCA/CIALIS) 20 MG tablet Take 1 tablet (20 mg total) by mouth daily as needed for erectile dysfunction. (Patient not taking: Reported on 03/11/2019)    No facility-administered encounter medications on file as of 03/24/2019.    Goals Addressed            This Visit's Progress   . PharmD - "I want to get my A1C down" (pt-stated)       CARE PLAN ENTRY (see longtitudinal plan of care for additional care plan information)  Current Barriers:    [name] Medication Adherence  . Financial Barriers . Knowledge deficits related to nutrition with diabetes  Pharmacist Clinical Goal(s):  [name] Over the next 30 days, patient will continue to work with PharmD to address needs related to address needs related to diabetes management.  . Over the next 30 days, patient will demonstrate maintained medication adherence as evidenced by patient report of adherence  Interventions:   . Receive message from [place] letting me know that patient has been approved to receive Trulicity from [place] patient assistance program for 2021 calendar year. . Follow up with patient regarding [name] patient assistance program o Confirms  picked up shipment of Trulicity as shipped to office. Reports receiving 3-4 boxes (unable to confirm as he is not home) o Counsel patient to reorder a refill from program wen he has 1 box remaining. Confirms having program phone number . Counsel on importance of blood sugar control and monitoring o Patient reports taking:  Metformin 1000 mg twice daily  Trulicity 1.5 mg weekly  Glipizide 5 mg - 1/2 tablet (2.5 mg) daily o Unable to review current CBG results  as is not currently home; about to start work.   Denies any s/s low blood sugars  Plan to review results at next call . Counsel on importance of medication adherence o Confirms continuing to use weekly pillbox adherence tool.  o Reports currently using last bottle of test strips.   Counsel patient to call [employer] mail order to reorder testing supplies  Patient Self Care Activities:  . Self administers all medications as prescribed o Using weekly pillbox as adherence tool . To check blood sugars daily to twice daily and keep log. . To contact pharmacy to order refills of medication . Calls provider office for new concerns or questions. . Patient to eat regular meals  Please see past updates related to this goal by clicking on the "Past Updates" button in the selected goal         Plan  Telephone follow up appointment with care management team member scheduled for: 4/7 at 8:30 am  Harlow Asa, PharmD, [place] [phone]

## 2019-03-24 NOTE — Patient Instructions (Signed)
Thank you allowing the Chronic Care Management Team to be a part of your care! It was a pleasure speaking with you today!     CCM (Chronic Care Management) Team    Alto Denver RN, MSN, CCM Nurse Care Coordinator  [phone]   [name] PharmD  Clinical Pharmacist  [phone]   Dickie La LCSW Clinical Social Worker [phone]  Visit Information  Goals Addressed            This Visit's Progress   . PharmD - "I want to get my A1C down" (pt-stated)       CARE PLAN ENTRY (see longtitudinal plan of care for additional care plan information)  Current Barriers:    [name] Medication Adherence  . Financial Barriers . Knowledge deficits related to nutrition with diabetes  Pharmacist Clinical Goal(s):  [name] Over the next 30 days, patient will continue to work with PharmD to address needs related to address needs related to diabetes management.  . Over the next 30 days, patient will demonstrate maintained medication adherence as evidenced by patient report of adherence  Interventions:   . Receive message from [facility] CPhT Noreene Larsson [place] letting me know that patient has been approved to receive Trulicity from [place] patient assistance program for 2021 calendar year. . Follow up with patient regarding [name] patient assistance program o Confirms picked up shipment of Trulicity as shipped to office. Reports receiving 3-4 boxes (unable to confirm as he is not home) o Counsel patient to reorder a refill from program wen he has 1 box remaining. Confirms having program phone number . Counsel on importance of blood sugar control and monitoring o Patient reports taking:  Metformin 1000 mg twice daily  Trulicity 1.5 mg weekly  Glipizide 5 mg - 1/2 tablet (2.5 mg) daily o Unable to review current CBG results as is not currently home; about to start work.   Denies any s/s low blood sugars  Plan to review results at next call . Counsel on importance of medication adherence o Confirms  continuing to use weekly pillbox adherence tool.  o Reports currently using last bottle of test strips.   Counsel patient to call [employer] mail order to reorder testing supplies  Patient Self Care Activities:  . Self administers all medications as prescribed o Using weekly pillbox as adherence tool . To check blood sugars daily to twice daily and keep log. . To contact pharmacy to order refills of medication . Calls provider office for new concerns or questions. . Patient to eat regular meals  Please see past updates related to this goal by clicking on the "Past Updates" button in the selected goal         Patient verbalizes understanding of instructions provided today.   Telephone follow up appointment with care management team member scheduled for: 4/7 at 8:30 am  [name], PharmD, [facility] Clinical Pharmacist [facility] [employer] [phone]

## 2019-04-12 ENCOUNTER — Telehealth: Payer: Self-pay | Admitting: Family Medicine

## 2019-04-12 NOTE — Telephone Encounter (Signed)
Received FMLA paperwork last week around 04/09/19.  Please notify patient that he will need to schedule in person or virtual visit to complete his FMLA paperwork.  Saralyn Pilar, DO [facility] 04/12/2019, 5:56 PM

## 2019-04-13 ENCOUNTER — Ambulatory Visit: Payer: Self-pay

## 2019-04-13 NOTE — Chronic Care Management (AMB) (Signed)
  Care Management   Follow Up Note   04/13/2019 Name: [name]. MRN: [id number] DOB: 1945/05/04  Referred by: [name], DO Reason for referral : Care Coordination   [name]. is a 74 y.o. year old male who is a primary care patient of [name], DO. The care management team was consulted for assistance with care management and care coordination needs.    Review of patient status, including review of consultants reports, relevant laboratory and other test results, and collaboration with appropriate care team members and the patient's provider was performed as part of comprehensive patient evaluation and provision of chronic care management services.    LCSW completed CCM outreach attempt today but was unable to reach patient successfully. A HIPPA compliant voice message was left encouraging patient to return call once available in order to schedule office visit with PCP. LCSW rescheduled CCM SW appointment as well.  Dickie La, BSW, MSW, LCSW [facility]  [facility] [email or web address] Phone: [phone]

## 2019-04-13 NOTE — Telephone Encounter (Signed)
Left message to schedule an apt

## 2019-04-20 ENCOUNTER — Encounter: Payer: Self-pay | Admitting: Family Medicine

## 2019-04-20 ENCOUNTER — Ambulatory Visit (INDEPENDENT_AMBULATORY_CARE_PROVIDER_SITE_OTHER): Payer: Medicare HMO | Admitting: Family Medicine

## 2019-04-20 ENCOUNTER — Other Ambulatory Visit: Payer: Self-pay

## 2019-04-20 DIAGNOSIS — E113293 Type 2 diabetes mellitus with mild nonproliferative diabetic retinopathy without macular edema, bilateral: Secondary | ICD-10-CM | POA: Diagnosis not present

## 2019-04-20 DIAGNOSIS — I10 Essential (primary) hypertension: Secondary | ICD-10-CM | POA: Diagnosis not present

## 2019-04-20 NOTE — Progress Notes (Signed)
FMLA Form Completion: Received FMLA paperwork for patient renewal.  Part A: Medical Facts: Type 2 diabetes and Hypertension on medication management, but may have flare up with abnormal BP and Headaches and Abnormal sugar.  Part B: Amount of Leave Needed 5. Continuous Leave - No   Chronic health condition - requires 1) periodic visits 2 per year at least, and may cause flare up.  Function limited - Driving unable to attend work and perform all tasks required with driving safely Limited by episodic flare ups only.  Intermittent leave - Yes  Start date - 04/07/2019 - end date 04/06/2020 Lifelong condition        - Frequency 6 appointment every year       - Duration 1 day per appointment  Episodic flare-ups - Yes.        - Frequency 1 time per 3 months       - Duration 1-2 days per episode  Reduced Schedule / Part Time - No   Completed, signed, and dated FMLA paperwork. To be scanned into chart and submitted via fax.  Note asked patient to clarify with his employer HR because the date range on the requested form says 04/07/19 to 04/30/19. And his understanding is that this is for up to 1 year.  Saralyn Pilar, DO [facility] 04/20/2019, 9:04 AM

## 2019-04-20 NOTE — Assessment & Plan Note (Signed)
Well-controlled DM with A1c 6.4 previously No hypoglycemia Complications - DM retinopathy hyperlipidemia- increases risk of future cardiovascular complications   Plan:  1. Continue current therapy - Trulicity 1.5mg  weekly, Metformin 1000mg  BID, Glipzide 2.5mg  daily wc - caution hypoglycemia 2. Encourage improved lifestyle - low carb, low sugar diet, reduce portion size, continue improving regular exercise 3. Check CBG , bring log to next visit for review 4. Continue ACEi, Statin 5. Advised to schedule DM ophtho exam, send record

## 2019-04-20 NOTE — Progress Notes (Signed)
Virtual Visit via Telephone The purpose of this virtual visit is to provide medical care while limiting exposure to the novel coronavirus (COVID19) for both patient and office staff.  Consent was obtained for phone visit:  Yes.   Answered questions that patient had about telehealth interaction:  Yes.   I discussed the limitations, risks, security and privacy concerns of performing an evaluation and management service by telephone. I also discussed with the patient that there may be a patient responsible charge related to this service. The patient expressed understanding and agreed to proceed.  Patient Location: Home Provider Location: [facility])  ---------------------------------------------------------------------- Chief Complaint  Patient presents with  . FMLA paperwork  . Diabetes  . Hypertension    S: Reviewed CMA documentation. I have called patient and gathered additional HPI as follows:   CHRONIC DM, Type 2w/ Retinopathy - Last visit with me 12/10/18 improvement on GLP1 regularly now with controlled A1c at 6.4. See prior notes for background info - Today patient reportshe is doing very well overall. No new major concerns. No further significant hypoglycemia readings.  - Trulicity 1.'5mg'$  weekly injection - Metformin '1000mg'$  BID -Taking Glipizide 2.'5mg'$  (half of '5mg'$ ) short acting sulfonylurea daily w/ breakfast Reports good compliance. Tolerating well w/o side-effects Currently on ACEi Lifestyle: Controlled weight - Diet (improve diet) - Exercise (Limited exercise due to time- gradually improving) - Dr [name] last DM Eye exam5/2019 w/ DM retinopathy, requiring monitoring- has not rescheduled or returned, he is due to go back now. Admits has had episodes of elevated blood sugar that have affected him in past. Denieshypoglycemia,polyuria, visual changes, numbness or tingling.  CHRONIC HTN: Reportsno new concerns.Controlled without  problem. Current Meds -Lisinopril-HCTZ 20-12.'5mg'$  once daily Reports good compliance, took meds today. Tolerating well, w/o complaints. Admits can have episodic headache flare with BP elevated. Denies CP, dyspnea, HA, edema, dizziness / lightheadedness   Denies any high risk travel to areas of current concern for COVID19. Denies any known or suspected exposure to person with or possibly with COVID19.  Denies any fevers, chills, sweats, body ache, cough, shortness of breath, sinus pain or pressure, headache, abdominal pain, diarrhea  Past Medical History:  Diagnosis Date  . Erectile dysfunction   . Hypertension   . Right-sided chest pain    Social History   Tobacco Use  . Smoking status: Current Some Day Smoker    Types: Cigarettes  . Smokeless tobacco: Current User  . Tobacco comment: pack of cigarettes last 1-2 months - doesnt inhale  Substance Use Topics  . Alcohol use: Yes    Alcohol/week: 1.0 standard drinks    Types: 1 Cans of beer per week    Comment: 1-2 beers week   . Drug use: No    Current Outpatient Medications:  .  ACCU-CHEK AVIVA PLUS test strip, Check sugar up to 2 x daily, Disp: 200 each, Rfl: 5 .  Accu-Chek FastClix Lancets MISC, Check sugar up to 2 x daily, Disp: 102 each, Rfl: 3 .  Accu-Chek Softclix Lancets lancets, Check sugar up to 2 x daily, Disp: 200 each, Rfl: 5 .  Blood Glucose Monitoring Suppl (ACCU-CHEK AVIVA PLUS) w/Device KIT, Use to check blood sugar up to x 2 daily, Disp: 1 kit, Rfl: 0 .  Dulaglutide (TRULICITY) 1.5 KX/3.8HW SOPN, Inject 1.5 mg into the skin once a week., Disp: 4 pen, Rfl: 0 .  glipiZIDE (GLUCOTROL) 5 MG tablet, TAKE 1/2 TABLET(HALF TABLET) BY MOUTH DAILY BEFORE BREAKFAST, Disp: 45 tablet, Rfl:  1 .  lisinopril-hydrochlorothiazide (ZESTORETIC) 20-12.5 MG tablet, Take 1 tablet by mouth daily., Disp: 90 tablet, Rfl: 3 .  metFORMIN (GLUCOPHAGE) 1000 MG tablet, TAKE 1 TABLET(1000 MG) BY MOUTH TWICE DAILY WITH A MEAL, Disp: 180  tablet, Rfl: 3 .  pravastatin (PRAVACHOL) 10 MG tablet, TAKE 1 TABLET(10 MG) BY MOUTH DAILY, Disp: 90 tablet, Rfl: 3 .  acetaminophen (TYLENOL) 325 MG tablet, Take 2 tablets (650 mg total) by mouth every 6 (six) hours as needed for mild pain or headache (fever >/= 101). (Patient not taking: Reported on 11/09/2018), Disp:  , Rfl:  .  Selenium Sulfide 2.25 % SHAM, APPLY EXTERNALLY TO THE AFFECTED AREA DAILY AS NEEDED FOR IRRITATION (Patient not taking: Reported on 11/02/2018), Disp: 180 mL, Rfl: 0 .  sildenafil (REVATIO) 20 MG tablet, Take 2-5 pills about 30 min prior to sex. (Patient not taking: Reported on 03/11/2019), Disp: 30 tablet, Rfl: 2 .  tadalafil (ADCIRCA/CIALIS) 20 MG tablet, Take 1 tablet (20 mg total) by mouth daily as needed for erectile dysfunction. (Patient not taking: Reported on 03/11/2019), Disp: 30 tablet, Rfl: 0  Depression screen [facility] 2/9 12/10/2018 09/02/2018 05/01/2018  Decreased Interest 0 0 0  Down, Depressed, Hopeless 0 0 0  PHQ - 2 Score 0 0 0  Altered sleeping - - -  Tired, decreased energy - - -  Change in appetite - - -  Feeling bad or failure about yourself  - - -  Trouble concentrating - - -  Moving slowly or fidgety/restless - - -  Suicidal thoughts - - -  PHQ-9 Score - - -  Difficult doing work/chores - - -    No flowsheet data found.  -------------------------------------------------------------------------- O: No physical exam performed due to remote telephone encounter.  Lab results reviewed.  No results found for this or any previous visit (from the past 2160 hour(s)).  -------------------------------------------------------------------------- A&P:  Problem List Items Addressed This Visit    Essential hypertension    Well-controlled HTN  No known complications     Plan:  1.  Continue current BP regimen Lisinopril HCTZ 20-12.22m daily 2. Encourage improved lifestyle - low sodium diet, regular exercise 3. Continue monitor BP outside office,  bring readings to next visit, if persistently >140/90 or new symptoms notify office sooner      Controlled type 2 diabetes mellitus with retinopathy ([place] - Primary    Well-controlled DM with A1c 6.4 previously No hypoglycemia Complications - DM retinopathy hyperlipidemia- increases risk of future cardiovascular complications   Plan:  1. Continue current therapy - Trulicity 13.6UYweekly, Metformin 10087mBID, Glipzide 2.76m62maily wc - caution hypoglycemia 2. Encourage improved lifestyle - low carb, low sugar diet, reduce portion size, continue improving regular exercise 3. Check CBG , bring log to next visit for review 4. Continue ACEi, Statin 5. Advised to schedule DM ophtho exam, send record        Completed FMLA form request See documentation in chart Intermittent leave request only. Date range requested 04/07/2019 to 04/06/2020 Up to 6 visits per 1 year Next apt 06/10/19 Episodic flare up approx estimate 1 flare pr 3 months, 1-2 days per flare  On form it says dates requested 04/07/19 to 04/30/19 - he can contact his employer to update this, should be up to 1 year Forms completed, to be faxed 04/20/19  No orders of the defined types were placed in this encounter.   Follow-up: - Return as scheduled 05/2019  Patient verbalizes understanding with the above medical  recommendations including the limitation of remote medical advice.  Specific follow-up and call-back criteria were given for patient to follow-up or seek medical care more urgently if needed.   - Time spent in direct consultation with patient on phone: 9 minutes  Nobie Putnam, [facility] 04/20/2019, 8:31 AM

## 2019-04-20 NOTE — Assessment & Plan Note (Signed)
Well-controlled HTN  No known complications     Plan:  1.  Continue current BP regimen Lisinopril HCTZ 20-12.5mg  daily 2. Encourage improved lifestyle - low sodium diet, regular exercise 3. Continue monitor BP outside office, bring readings to next visit, if persistently >140/90 or new symptoms notify office sooner

## 2019-04-23 ENCOUNTER — Ambulatory Visit: Payer: Medicare HMO | Admitting: Family Medicine

## 2019-04-23 ENCOUNTER — Ambulatory Visit: Payer: Medicare HMO | Attending: Internal Medicine

## 2019-04-23 DIAGNOSIS — Z23 Encounter for immunization: Secondary | ICD-10-CM

## 2019-04-23 NOTE — Progress Notes (Signed)
   [id number]  Name:  [name].    MRN: [id number] DOB: [birth year]  04/23/2019  [name] was observed post Covid-19 immunization for 15 minutes without incident. He was provided with Vaccine Information Sheet and instruction to access the V-Safe system.   [name] was instructed to call 911 with any severe reactions post vaccine: [name] Difficulty breathing  . Swelling of face and throat  . A fast heartbeat  . A bad rash all over body  . Dizziness and weakness   Immunizations Administered    Name Date Dose VIS Date Route   Pfizer COVID-19 Vaccine 04/23/2019  2:00 PM 0.3 mL 01/01/2019 Intramuscular   Manufacturer: [employer], [employer]   Lot: [id number]   NDC: [address]-0

## 2019-04-28 ENCOUNTER — Ambulatory Visit (INDEPENDENT_AMBULATORY_CARE_PROVIDER_SITE_OTHER): Payer: Medicare HMO | Admitting: Pharmacist

## 2019-04-28 DIAGNOSIS — E113293 Type 2 diabetes mellitus with mild nonproliferative diabetic retinopathy without macular edema, bilateral: Secondary | ICD-10-CM

## 2019-04-28 DIAGNOSIS — I10 Essential (primary) hypertension: Secondary | ICD-10-CM | POA: Diagnosis not present

## 2019-04-28 DIAGNOSIS — E785 Hyperlipidemia, unspecified: Secondary | ICD-10-CM

## 2019-04-28 DIAGNOSIS — E1169 Type 2 diabetes mellitus with other specified complication: Secondary | ICD-10-CM | POA: Diagnosis not present

## 2019-04-28 NOTE — Patient Instructions (Signed)
Thank you allowing the Chronic Care Management Team to be a part of your care! It was a pleasure speaking with you today!     CCM (Chronic Care Management) Team    Alto Denver RN, MSN, CCM Nurse Care Coordinator  [phone]   [name] PharmD  Clinical Pharmacist  [phone]   Dickie La LCSW Clinical Social Worker [phone]  Visit Information  Goals Addressed            This Visit's Progress   . PharmD - "I want to get my A1C down" (pt-stated)       CARE PLAN ENTRY (see longtitudinal plan of care for additional care plan information)  Current Barriers:    [name] Medication Adherence  . Financial Barriers . Knowledge deficits related to nutrition with diabetes  Pharmacist Clinical Goal(s):  [name] Over the next 30 days, patient will continue to work with PharmD to address needs related to address needs related to diabetes management.  . Over the next 30 days, patient will demonstrate maintained medication adherence as evidenced by patient report of adherence  Interventions:   . Counsel on importance of blood sugar control and monitoring o Patient reports taking:  Metformin 1000 mg twice daily  Trulicity 1.5 mg weekly  Glipizide 5 mg - 1/2 tablet (2.5 mg) daily o Reports needs new battery for glucometer - meter not working for a couple of days - will pick up battery o Reports recent morning fasting CBGs running 90s-140s  Denies any s/s low blood sugars . Counsel on importance of home BP monitoring o Reports taking lisinopril/HCTZ 20-12.5 mg daily o Denies having checked recently, but states that he will check occasionally o Encourage patient to call office for BP results outside of established parameters . Counsel on importance of medication adherence o Confirms continuing to use weekly pillbox adherence tool.  o Confirms reordered diabetes testing supplies o Planning to pick up refill of pravastatin today  Patient Self Care Activities:  . Self  administers all medications as prescribed o Using weekly pillbox as adherence tool . To check blood sugars daily to twice daily and keep log. . To contact pharmacy to order refills of medication . Calls provider office for new concerns or questions. . Patient to eat regular meals  Please see past updates related to this goal by clicking on the "Past Updates" button in the selected goal         Patient verbalizes understanding of instructions provided today.   Telephone follow up appointment with care management team member scheduled for: 6/2 at 8:30 am  [name], PharmD, [facility] Clinical Pharmacist [facility] [phone]

## 2019-04-28 NOTE — Chronic Care Management (AMB) (Signed)
Chronic Care Management   Follow Up Note   04/28/2019 Name: [name]. MRN: [id number] DOB: Nov 10, 1945  Referred by: Christopher Hauser, DO Reason for referral : Chronic Care Management (Patient Phone Call)   [name]. is a 74 y.o. year old male who is a primary care patient of Christopher Hauser, DO. The CCM team was consulted for assistance with chronic disease management and care coordination needs. [name] has a past medical history including but not limited to: hypertension, type 2 diabetes and hyperlipidemia   I reached out to Annia Mclaughlin. by phone today.   Review of patient status, including review of consultants reports, relevant laboratory and other test results, and collaboration with appropriate care team members and the patient's provider was performed as part of comprehensive patient evaluation and provision of chronic care management services.     Outpatient Encounter Medications as of 04/28/2019  Medication Sig Note  . Dulaglutide (TRULICITY) 1.5 LP/3.7TK SOPN Inject 1.5 mg into the skin once a week. 11/19/2018: On Mondays  . glipiZIDE (GLUCOTROL) 5 MG tablet TAKE 1/2 TABLET(HALF TABLET) BY MOUTH DAILY BEFORE BREAKFAST   . lisinopril-hydrochlorothiazide (ZESTORETIC) 20-12.5 MG tablet Take 1 tablet by mouth daily.   . metFORMIN (GLUCOPHAGE) 1000 MG tablet TAKE 1 TABLET(1000 MG) BY MOUTH TWICE DAILY WITH A MEAL   . pravastatin (PRAVACHOL) 10 MG tablet TAKE 1 TABLET(10 MG) BY MOUTH DAILY   . ACCU-CHEK AVIVA PLUS test strip Check sugar up to 2 x daily   . Accu-Chek FastClix Lancets MISC Check sugar up to 2 x daily   . Accu-Chek Softclix Lancets lancets Check sugar up to 2 x daily   . acetaminophen (TYLENOL) 325 MG tablet Take 2 tablets (650 mg total) by mouth every 6 (six) hours as needed for mild pain or headache (fever >/= 101). (Patient not taking: Reported on 11/09/2018)   . Blood Glucose Monitoring Suppl (ACCU-CHEK AVIVA PLUS) w/Device KIT Use  to check blood sugar up to x 2 daily   . Selenium Sulfide 2.25 % SHAM APPLY EXTERNALLY TO THE AFFECTED AREA DAILY AS NEEDED FOR IRRITATION (Patient not taking: Reported on 11/02/2018)   . sildenafil (REVATIO) 20 MG tablet Take 2-5 pills about 30 min prior to sex. (Patient not taking: Reported on 03/11/2019)   . tadalafil (ADCIRCA/CIALIS) 20 MG tablet Take 1 tablet (20 mg total) by mouth daily as needed for erectile dysfunction. (Patient not taking: Reported on 03/11/2019)    No facility-administered encounter medications on file as of 04/28/2019.    Goals Addressed            This Visit's Progress   . PharmD - "I want to get my A1C down" (pt-stated)       CARE PLAN ENTRY (see longtitudinal plan of care for additional care plan information)  Current Barriers:    [name] Medication Adherence  . Financial Barriers . Knowledge deficits related to nutrition with diabetes  Pharmacist Clinical Goal(s):  [name] Over the next 30 days, patient will continue to work with PharmD to address needs related to address needs related to diabetes management.  . Over the next 30 days, patient will demonstrate maintained medication adherence as evidenced by patient report of adherence  Interventions:   . Counsel on importance of blood sugar control and monitoring o Patient reports taking:  Metformin 1000 mg twice daily  Trulicity 1.5 mg weekly  Glipizide 5 mg - 1/2 tablet (2.5 mg) daily o Reports needs new battery for  glucometer - meter not working for a couple of days - will pick up battery o Reports recent morning fasting CBGs running 90s-140s  Denies any s/s low blood sugars . Counsel on importance of home BP monitoring o Reports taking lisinopril/HCTZ 20-12.5 mg daily o Denies having checked recently, but states that he will check occasionally o Encourage patient to call office for BP results outside of established parameters . Counsel on importance of medication adherence o Confirms continuing to use  weekly pillbox adherence tool.  o Confirms reordered diabetes testing supplies o Planning to pick up refill of pravastatin today  Patient Self Care Activities:  . Self administers all medications as prescribed o Using weekly pillbox as adherence tool . To check blood sugars daily to twice daily and keep log. . To contact pharmacy to order refills of medication . Calls provider office for new concerns or questions. . Patient to eat regular meals  Please see past updates related to this goal by clicking on the "Past Updates" button in the selected goal         Plan  Telephone follow up appointment with care management team member scheduled for: 6/2 at 8:30 am  Harlow Asa, PharmD, [place] [phone]

## 2019-05-14 ENCOUNTER — Ambulatory Visit: Payer: Medicare HMO | Attending: Internal Medicine

## 2019-05-14 DIAGNOSIS — Z23 Encounter for immunization: Secondary | ICD-10-CM

## 2019-05-14 NOTE — Progress Notes (Signed)
   [id number]  Name:  [name].    MRN: [id number] DOB: 06-Jul-1945  05/14/2019  [name] was observed post Covid-19 immunization for 15 minutes without incident. He was provided with Vaccine Information Sheet and instruction to access the V-Safe system.   [name] was instructed to call 911 with any severe reactions post vaccine: [name] Difficulty breathing  . Swelling of face and throat  . A fast heartbeat  . A bad rash all over body  . Dizziness and weakness   Immunizations Administered    Name Date Dose VIS Date Route   Pfizer COVID-19 Vaccine 05/14/2019  4:17 PM 0.3 mL 03/17/2018 Intramuscular   Manufacturer: [employer], [employer]   Lot: [id number]   NDC: [address]-3

## 2019-05-18 ENCOUNTER — Ambulatory Visit: Payer: Self-pay

## 2019-05-18 NOTE — Chronic Care Management (AMB) (Signed)
  Care Management   Follow Up Note   05/18/2019 Name: Refael Fulop. MRN: [id number] DOB: 1945/11/15  Referred by: [name], DO Reason for referral : Care Coordination   Izak Anding. is a 74 y.o. year old male who is a primary care patient of [name], DO. The care management team was consulted for assistance with care management and care coordination needs.    Review of patient status, including review of consultants reports, relevant laboratory and other test results, and collaboration with appropriate care team members and the patient's provider was performed as part of comprehensive patient evaluation and provision of chronic care management services.    LCSW completed CCM outreach attempt today but was unable to reach patient successfully. A HIPPA compliant voice message was left encouraging patient to return call once available. LCSW rescheduled CCM SW appointment as well.  A HIPPA compliant phone message was left for the patient providing contact information and requesting a return call.   Dickie La, BSW, MSW, LCSW [facility]  [facility] [email or web address] Phone: [phone]

## 2019-05-29 ENCOUNTER — Other Ambulatory Visit: Payer: Self-pay | Admitting: Family Medicine

## 2019-05-29 DIAGNOSIS — I10 Essential (primary) hypertension: Secondary | ICD-10-CM

## 2019-05-29 NOTE — Telephone Encounter (Signed)
Requested Prescriptions  Pending Prescriptions Disp Refills  . lisinopril-hydrochlorothiazide (ZESTORETIC) 20-12.5 MG tablet [Pharmacy Med Name: LISINOPRIL-HCTZ 20/12.5MG  TABLETS] 90 tablet 1    Sig: TAKE 1 TABLET BY MOUTH EVERY DAY     Cardiovascular:  ACEI + Diuretic Combos Passed - 05/29/2019  3:30 PM      Passed - Na in normal range and within 180 days    Sodium  Date Value Ref Range Status  12/03/2018 138 135 - 146 mmol/L Final  05/10/2015 141 134 - 144 mmol/L Final         Passed - K in normal range and within 180 days    Potassium  Date Value Ref Range Status  12/03/2018 4.2 3.5 - 5.3 mmol/L Final         Passed - Cr in normal range and within 180 days    Creat  Date Value Ref Range Status  12/03/2018 1.03 0.70 - 1.18 mg/dL Final    Comment:    For patients >47 years of age, the reference limit for Creatinine is approximately 13% higher for people identified as African-American. .    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, without long-term current use of insulin [facility])   [facility], Netta Neat, DO   5 months ago Annual physical exam   [facility] [name], DO   6 months ago COVID-19   [facility] [place], Netta Neat, DO   6 months ago Pneumonia due to COVID-19 virus   [facility] Kyung Rudd, Alison Stalling, NP   7 months ago Nausea without  vomiting   [facility] [place], Netta Neat, DO      Future Appointments            In 1 week Althea Charon, Netta Neat, DO [facility]

## 2019-06-10 ENCOUNTER — Other Ambulatory Visit: Payer: Self-pay | Admitting: Family Medicine

## 2019-06-10 ENCOUNTER — Other Ambulatory Visit: Payer: Self-pay

## 2019-06-10 ENCOUNTER — Encounter: Payer: Self-pay | Admitting: Family Medicine

## 2019-06-10 ENCOUNTER — Ambulatory Visit (INDEPENDENT_AMBULATORY_CARE_PROVIDER_SITE_OTHER): Payer: Medicare HMO | Admitting: Family Medicine

## 2019-06-10 VITALS — BP 134/77 | HR 67 | Temp 96.9°F | Resp 16 | Ht 71.0 in | Wt 187.0 lb

## 2019-06-10 DIAGNOSIS — E1169 Type 2 diabetes mellitus with other specified complication: Secondary | ICD-10-CM

## 2019-06-10 DIAGNOSIS — R351 Nocturia: Secondary | ICD-10-CM

## 2019-06-10 DIAGNOSIS — N529 Male erectile dysfunction, unspecified: Secondary | ICD-10-CM | POA: Diagnosis not present

## 2019-06-10 DIAGNOSIS — E113293 Type 2 diabetes mellitus with mild nonproliferative diabetic retinopathy without macular edema, bilateral: Secondary | ICD-10-CM

## 2019-06-10 DIAGNOSIS — I10 Essential (primary) hypertension: Secondary | ICD-10-CM

## 2019-06-10 DIAGNOSIS — E785 Hyperlipidemia, unspecified: Secondary | ICD-10-CM

## 2019-06-10 DIAGNOSIS — L700 Acne vulgaris: Secondary | ICD-10-CM | POA: Diagnosis not present

## 2019-06-10 DIAGNOSIS — Z0000 Encounter for general adult medical examination without abnormal findings: Secondary | ICD-10-CM

## 2019-06-10 LAB — POCT GLYCOSYLATED HEMOGLOBIN (HGB A1C): Hemoglobin A1C: 6 % — AB (ref 4.0–5.6)

## 2019-06-10 MED ORDER — ADAPALENE 0.1 % EX GEL: Freq: Every day | CUTANEOUS | 0 refills | Status: DC

## 2019-06-10 NOTE — Assessment & Plan Note (Signed)
Previous history of similar issue Now some areas of flare up Trial on topical Differin gel PRN spot treatment

## 2019-06-10 NOTE — Assessment & Plan Note (Signed)
Controlled cholesterol on statin lifestyle  Plan: 1. Continue current meds - Pravastatin 10mg  2. Encourage improved lifestyle - low carb/cholesterol, reduce portion size, continue improving regular exercise

## 2019-06-10 NOTE — Assessment & Plan Note (Addendum)
Well-controlled DM with A1c down to 6, on lifestyle and GLP1 No hypoglycemia Complications - DM retinopathy hyperlipidemia- increases risk of future cardiovascular complications   Plan:  1. Continue current therapy - Trulicity 1.5mg  weekly, Metformin 1000mg  BID, Glipzide 2.5mg  daily wc - caution hypoglycemia - Again strongly encouraged him to work on weaning down or off Glipizide 2.5mg  (half of the 5mg ) daily - as A1c is excellent control may not require this med 2. Encourage improved lifestyle - low carb, low sugar diet, reduce portion size, continue improving regular exercise 3. Check CBG , bring log to next visit for review 4. Continue ACEi, Statin 5. Advised to schedule DM ophtho exam, send record - [place]  Needs A1c in Feb 2022

## 2019-06-10 NOTE — Assessment & Plan Note (Signed)
Well-controlled HTN - not checking at home. Has secondary ED  No known complications     Plan:  1. MAY Try to REDUCE dose by cutting tab in half -  Lisinopril HCTZ 20-12.5mg  daily - if half dose works we can adjust rx - Goal to limit meds improve ED 2. Encourage improved lifestyle - low sodium diet, regular exercise 3. Continue monitor BP outside office, bring readings to next visit, if persistently >140/90 or new symptoms notify office sooner

## 2019-06-10 NOTE — Assessment & Plan Note (Signed)
Likely multifactorial Worse with HTN DM and on meds  Trial on reduced HTN med - if helps and controls BP may use sildenafil less

## 2019-06-10 NOTE — Patient Instructions (Addendum)
Thank you for coming to the office today.  Try to reduce BP medication Cut tablet in half - take one half every day. Keep track of BP readings at home, and see how you do. If >140/90 consistently, then can restart one whole pill daily instead. Let me know if questions. If doing well on half tablet, we can re order it differently in the future.  May also try to reduce Glipizide half tablet daily in future - can try without this a few a days at a time if you are interested to reduce.  We can return in February 2022 for A1c   Your provider would like to you have your annual eye exam. Please contact your current eye doctor to schedule  [facility]   Address: [address] [place] [address] Phone: [phone]  Website: [email or web address]  DUE for FASTING BLOOD WORK (no food or drink after midnight before the lab appointment, only water or coffee without cream/sugar on the morning of)  SCHEDULE "Lab Only" visit in the morning at the clinic for lab draw in 6 MONTHS   - Make sure Lab Only appointment is at about 1 week before your next appointment, so that results will be available  For Lab Results, once available within 2-3 days of blood draw, you can can log in to MyChart online to view your results and a brief explanation. Also, we can discuss results at next follow-up visit.     Please schedule a Follow-up Appointment to: Return in about 6 months (around 12/11/2019) for Annual Physical.  If you have any other questions or concerns, please feel free to call the office or send a message through MyChart. You may also schedule an earlier appointment if necessary.  Additionally, you may be receiving a survey about your experience at our office within a few days to 1 week by e-mail or mail. We value your feedback.  Saralyn Pilar, DO [facility], [place]

## 2019-06-10 NOTE — Assessment & Plan Note (Signed)
See A&P Needs to schedule w/ Dr Clydene Pugh for DM Eye Exam

## 2019-06-10 NOTE — Progress Notes (Signed)
Subjective:    Patient ID: Christopher Bruins., male    DOB: 04-Jan-1946, 74 y.o.   MRN: [id number]  Christopher Grasmick. is a 74 y.o. male presenting on 06/10/2019 for Diabetes   HPI   CHRONIC DM, Type 2w/ Retinopathy - Last visit with me 11/19/20improvement on GLP1 regularly now with controlled A1c See prior notes for background info - Today patient reportshe is doing very well overall. No new major concerns. No further significant hypoglycemia readings.  Due for A1c today - Trulicity 1.5mg  weekly injection - Metformin 1000mg  BID -Taking Glipizide 2.5mg  (half of 5mg ) short acting sulfonylurea daily w/ breakfast Reports good compliance. Tolerating well w/o side-effects Currently on ACEi Lifestyle: Controlled weight - Diet (improve diet) - Exercise (Limited exercise due to time- gradually improving) - Dr last DM Eye exam5/2019 w/ DM retinopathy, requiring monitoring- has not rescheduled or returned, he is due to go back now. Admits has had episodes of elevated blood sugar that have affected him in past. Denieshypoglycemia,polyuria, visual changes, numbness or tingling.  CHRONIC HTN: Reportsno new concerns.Controlled without problem. Asking about reducing dose Current Meds -Lisinopril-HCTZ 20-12.5mg  once daily Reports good compliance, took meds today. Tolerating well, w/o complaints. Admits can have episodic headache flare with BP elevated. Denies CP, dyspnea, HA, edema, dizziness / lightheadedness  Erectile dysfunction On Sildenafil PRN  Hoarse Voice - Post COVID19  Facial Acne spots - worse recently with wearing mask   Depression screen [facility] 2/9 06/10/2019 12/10/2018 09/02/2018  Decreased Interest 0 0 0  Down, Depressed, Hopeless 0 0 0  PHQ - 2 Score 0 0 0  Altered sleeping - - -  Tired, decreased energy - - -  Change in appetite - - -  Feeling bad or failure about yourself  - - -  Trouble concentrating - - -  Moving slowly or fidgety/restless - - -    Suicidal thoughts - - -  PHQ-9 Score - - -  Difficult doing work/chores - - -    Social History   Tobacco Use  . Smoking status: Current Some Day Smoker    Types: Cigarettes  . Smokeless tobacco: Current User  . Tobacco comment: pack of cigarettes last 1-2 months - doesnt inhale  Substance Use Topics  . Alcohol use: Yes    Alcohol/week: 1.0 standard drinks    Types: 1 Cans of beer per week    Comment: 1-2 beers week   . Drug use: No    Review of Systems Per HPI unless specifically indicated above     Objective:    BP 134/77   Pulse 67   Temp (!) 96.9 F (36.1 C) (Temporal)   Resp 16   Ht 5\' 11"  (1.803 m)   Wt 187 lb (84.8 kg)   SpO2 99%   BMI 26.08 kg/m   Wt Readings from Last 3 Encounters:  06/10/19 187 lb (84.8 kg)  12/10/18 184 lb (83.5 kg)  10/22/18 184 lb 11.9 oz (83.8 kg)    Physical Exam Vitals and nursing note reviewed.  Constitutional:      General: He is not in acute distress.    Appearance: He is well-developed. He is not diaphoretic.     Comments: Well-appearing, comfortable, cooperative  HENT:     Head: Normocephalic and atraumatic.  Eyes:     General:        Right eye: No discharge.        Left eye: No discharge.     Conjunctiva/sclera: Conjunctivae  normal.  Neck:     Thyroid: No thyromegaly.  Cardiovascular:     Rate and Rhythm: Normal rate and regular rhythm.     Heart sounds: Normal heart sounds. No murmur.  Pulmonary:     Effort: Pulmonary effort is normal. No respiratory distress.     Breath sounds: Normal breath sounds. No wheezing or rales.  Musculoskeletal:        General: Normal range of motion.     Cervical back: Normal range of motion and neck supple.  Lymphadenopathy:     Cervical: No cervical adenopathy.  Skin:    General: Skin is warm and dry.     Findings: No erythema or rash.     Comments: Several scattered mixed stage healing pustule or acne on face no secondary infection  Neurological:     Mental Status: He is  alert and oriented to person, place, and time.  Psychiatric:        Behavior: Behavior normal.     Comments: Well groomed, good eye contact, normal speech and thoughts      Recent Labs    10/23/18 0256 12/03/18 0847 06/10/19 0810  HGBA1C 6.3* 6.4* 6.0*    Results for orders placed or performed in visit on 06/10/19  POCT HgB A1C  Result Value Ref Range   Hemoglobin A1C 6.0 (A) 4.0 - 5.6 %      Assessment & Plan:   Problem List Items Addressed This Visit    Hyperlipidemia associated with type 2 diabetes mellitus ([place])    Controlled cholesterol on statin lifestyle  Plan: 1. Continue current meds - Pravastatin 10mg  2. Encourage improved lifestyle - low carb/cholesterol, reduce portion size, continue improving regular exercise      Essential hypertension    Well-controlled HTN - not checking at home. Has secondary ED  No known complications     Plan:  1. MAY Try to REDUCE dose by cutting tab in half -  Lisinopril HCTZ 20-12.5mg  daily - if half dose works we can adjust rx - Goal to limit meds improve ED 2. Encourage improved lifestyle - low sodium diet, regular exercise 3. Continue monitor BP outside office, bring readings to next visit, if persistently >140/90 or new symptoms notify office sooner      ED (erectile dysfunction) of organic origin    Likely multifactorial Worse with HTN DM and on meds  Trial on reduced HTN med - if helps and controls BP may use sildenafil less      Diabetic retinopathy ([place])    See A&P Needs to schedule w/ Dr [name] for DM Eye Exam      Controlled type 2 diabetes mellitus with retinopathy ([place]) - Primary    Well-controlled DM with A1c down to 6, on lifestyle and GLP1 No hypoglycemia Complications - DM retinopathy hyperlipidemia- increases risk of future cardiovascular complications   Plan:  1. Continue current therapy - Trulicity 1.5mg  weekly, Metformin 1000mg  BID, Glipzide 2.5mg  daily wc - caution hypoglycemia - Again strongly  encouraged him to work on weaning down or off Glipizide 2.5mg  (half of the 5mg ) daily - as A1c is excellent control may not require this med 2. Encourage improved lifestyle - low carb, low sugar diet, reduce portion size, continue improving regular exercise 3. Check CBG , bring log to next visit for review 4. Continue ACEi, Statin 5. Advised to schedule DM ophtho exam, send record - [place]  Needs A1c in Feb 2022      Relevant Orders  POCT HgB A1C (Completed)   Acne vulgaris    Previous history of similar issue Now some areas of flare up Trial on topical Differin gel PRN spot treatment      Relevant Medications   adapalene (DIFFERIN) 0.1 % gel      Meds ordered this encounter  Medications  . adapalene (DIFFERIN) 0.1 % gel    Sig: Apply topically at bedtime. As needed    Dispense:  45 g    Refill:  0     Follow up plan: Return in about 6 months (around 12/11/2019) for Annual Physical.  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

## 2019-06-23 ENCOUNTER — Telehealth: Payer: Self-pay

## 2019-06-24 ENCOUNTER — Other Ambulatory Visit: Payer: Self-pay | Admitting: Family Medicine

## 2019-06-24 DIAGNOSIS — I10 Essential (primary) hypertension: Secondary | ICD-10-CM

## 2019-06-24 DIAGNOSIS — E113293 Type 2 diabetes mellitus with mild nonproliferative diabetic retinopathy without macular edema, bilateral: Secondary | ICD-10-CM

## 2019-06-24 MED ORDER — LISINOPRIL-HYDROCHLOROTHIAZIDE 20-12.5 MG PO TABS: 1.0000 | ORAL_TABLET | Freq: Every day | ORAL | 3 refills | Status: DC

## 2019-06-24 MED ORDER — METFORMIN HCL 1000 MG PO TABS: 1000.0000 mg | ORAL_TABLET | Freq: Two times a day (BID) | ORAL | 3 refills | Status: DC

## 2019-06-24 NOTE — Telephone Encounter (Signed)
Refill on metformin 1000 MG,Lisinopril  12.5 MG ,  Called into  [facility] in [place]

## 2019-06-24 NOTE — Telephone Encounter (Signed)
Refilled  Saralyn Pilar, DO [facility] 06/24/2019, 12:10 PM

## 2019-06-28 ENCOUNTER — Ambulatory Visit: Payer: Self-pay | Admitting: Pharmacist

## 2019-06-28 DIAGNOSIS — I10 Essential (primary) hypertension: Secondary | ICD-10-CM

## 2019-06-28 NOTE — Patient Instructions (Signed)
Thank you allowing the Chronic Care Management Team to be a part of your care! It was a pleasure speaking with you today!     CCM (Chronic Care Management) Team    Alto Denver RN, MSN, CCM Nurse Care Coordinator  [phone]   [name] PharmD  Clinical Pharmacist  [phone]   Dickie La LCSW Clinical Social Worker [phone]  Visit Information  Goals Addressed            This Visit's Progress   . PharmD - "I want to get my A1C down" (pt-stated)       CARE PLAN ENTRY (see longtitudinal plan of care for additional care plan information)  Current Barriers:    [name] Medication Adherence  . Financial Barriers . Knowledge deficits related to nutrition with diabetes  Pharmacist Clinical Goal(s):  [name] Over the next 30 days, patient will continue to work with PharmD to address needs related to address needs related to diabetes management.  . Over the next 30 days, patient will demonstrate maintained medication adherence as evidenced by patient report of adherence  Interventions:   . Perform chart review o Patient seen by PCP on 5/20  For HTN management, advised by try reducing lisinopril 20-12.5 mg to take  tablet daily.   Patient to monitor home BP.  For diabetes medication management,  Patient advised to continue Trulicity 1.5 weekly, metformin 1000mg  BID, and to work on weaning off current dose of glipizide 2.5mg  daily, as blood sugar currently well-controlled . Reschedule appointment to review medications as well as blood pressure and blood sugar monitoring results. Patient reports ended up working later today and still at work  Patient Self Care Activities:  . Self administers all medications as prescribed o Using weekly pillbox as adherence tool . To check blood sugars daily to twice daily and keep log. . To contact pharmacy to order refills of medication . Calls provider office for new concerns or questions. . Patient to eat regular meals  Please  see past updates related to this goal by clicking on the "Past Updates" button in the selected goal         Patient verbalizes understanding of instructions provided today.   Telephone follow up appointment with care management team member scheduled for: 6/9 at 2:30 pm  8/9, PharmD, [facility] Clinical Pharmacist [facility] [phone]

## 2019-06-28 NOTE — Chronic Care Management (AMB) (Signed)
Chronic Care Management   Follow Up Note   06/28/2019 Name: [name]. MRN: [id number] DOB: 1945-06-19  Referred by: [name], DO Reason for referral : Chronic Care Management (Patient Phone Call)   [name]. is a 74 y.o. year old male who is a primary care patient of [name], DO. The CCM team was consulted for assistance with chronic disease management and care coordination needs.    I reached out to [name]. by phone today. Reschedule appointment with patient as requested.  Review of patient status, including review of consultants reports, relevant laboratory and other test results, and collaboration with appropriate care team members and the patient's provider was performed as part of comprehensive patient evaluation and provision of chronic care management services.     Outpatient Encounter Medications as of 06/28/2019  Medication Sig Note  . ACCU-CHEK AVIVA PLUS test strip Check sugar up to 2 x daily   . Accu-Chek FastClix Lancets MISC Check sugar up to 2 x daily   . Accu-Chek Softclix Lancets lancets Check sugar up to 2 x daily   . acetaminophen (TYLENOL) 325 MG tablet Take 2 tablets (650 mg total) by mouth every 6 (six) hours as needed for mild pain or headache (fever >/= 101).   [name] adapalene (DIFFERIN) 0.1 % gel Apply topically at bedtime. As needed   . Blood Glucose Monitoring Suppl (ACCU-CHEK AVIVA PLUS) w/Device KIT Use to check blood sugar up to x 2 daily   . Dulaglutide (TRULICITY) 1.5 RS/8.5IO SOPN Inject 1.5 mg into the skin once a week. 11/19/2018: On Mondays  . glipiZIDE (GLUCOTROL) 5 MG tablet TAKE 1/2 TABLET(HALF TABLET) BY MOUTH DAILY BEFORE BREAKFAST   . lisinopril-hydrochlorothiazide (ZESTORETIC) 20-12.5 MG tablet Take 1 tablet by mouth daily.   . metFORMIN (GLUCOPHAGE) 1000 MG tablet Take 1 tablet (1,000 mg total) by mouth 2 (two) times daily with a meal.   . pravastatin (PRAVACHOL) 10 MG tablet TAKE 1 TABLET(10  MG) BY MOUTH DAILY   . Selenium Sulfide 2.25 % SHAM APPLY EXTERNALLY TO THE AFFECTED AREA DAILY AS NEEDED FOR IRRITATION   . sildenafil (REVATIO) 20 MG tablet Take 2-5 pills about 30 min prior to sex.    No facility-administered encounter medications on file as of 06/28/2019.    Goals Addressed            This Visit's Progress   . PharmD - "I want to get my A1C down" (pt-stated)       CARE PLAN ENTRY (see longtitudinal plan of care for additional care plan information)  Current Barriers:    [name] Medication Adherence  . Financial Barriers . Knowledge deficits related to nutrition with diabetes  Pharmacist Clinical Goal(s):  [name] Over the next 30 days, patient will continue to work with PharmD to address needs related to address needs related to diabetes management.  . Over the next 30 days, patient will demonstrate maintained medication adherence as evidenced by patient report of adherence  Interventions:   . Perform chart review o Patient seen by PCP on 5/20  For HTN management, advised by try reducing lisinopril 20-12.5 mg to take  tablet daily.   Patient to monitor home BP.  For diabetes medication management,  Patient advised to continue Trulicity 1.5 weekly, metformin '1000mg'$  BID, and to work on weaning off current dose of glipizide 2.'5mg'$  daily, as blood sugar currently well-controlled . Reschedule appointment to review medications as well as blood pressure and blood sugar  monitoring results. Patient reports ended up working later today and still at work  Patient Self Care Activities:  . Self administers all medications as prescribed o Using weekly pillbox as adherence tool . To check blood sugars daily to twice daily and keep log. . To contact pharmacy to order refills of medication . Calls provider office for new concerns or questions. . Patient to eat regular meals  Please see past updates related to this goal by clicking on the "Past Updates" button in the selected goal          Plan  Telephone follow up appointment with care management team member scheduled for: 6/7 at 2:30 pm  Harlow Asa, PharmD, [place] [phone]

## 2019-06-30 ENCOUNTER — Ambulatory Visit: Payer: Self-pay | Admitting: Pharmacist

## 2019-06-30 DIAGNOSIS — I10 Essential (primary) hypertension: Secondary | ICD-10-CM

## 2019-06-30 DIAGNOSIS — E113293 Type 2 diabetes mellitus with mild nonproliferative diabetic retinopathy without macular edema, bilateral: Secondary | ICD-10-CM

## 2019-06-30 NOTE — Chronic Care Management (AMB) (Signed)
Chronic Care Management   Follow Up Note   06/30/2019 Name: [name]. MRN: [id number] DOB: Dec 19, 1945  Referred by: [name], DO Reason for referral : Chronic Care Management (Patient Phone Call)   [name]. is a 74 y.o. year old male who is a primary care patient of [name], DO. The CCM team was consulted for assistance with chronic disease management and care coordination needs.    I reached out to [name]. by phone today.   Review of patient status, including review of consultants reports, relevant laboratory and other test results, and collaboration with appropriate care team members and the patient's provider was performed as part of comprehensive patient evaluation and provision of chronic care management services.     Outpatient Encounter Medications as of 06/30/2019  Medication Sig Note  . Dulaglutide (TRULICITY) 1.5 UL/8.4TX SOPN Inject 1.5 mg into the skin once a week. 11/19/2018: On Mondays  . glipiZIDE (GLUCOTROL) 5 MG tablet TAKE 1/2 TABLET(HALF TABLET) BY MOUTH DAILY BEFORE BREAKFAST   . lisinopril-hydrochlorothiazide (ZESTORETIC) 20-12.5 MG tablet Take 1 tablet by mouth daily. (Patient taking differently: Take 0.5 tablets by mouth daily. )   . metFORMIN (GLUCOPHAGE) 1000 MG tablet Take 1 tablet (1,000 mg total) by mouth 2 (two) times daily with a meal.   . ACCU-CHEK AVIVA PLUS test strip Check sugar up to 2 x daily   . Accu-Chek FastClix Lancets MISC Check sugar up to 2 x daily   . Accu-Chek Softclix Lancets lancets Check sugar up to 2 x daily   . acetaminophen (TYLENOL) 325 MG tablet Take 2 tablets (650 mg total) by mouth every 6 (six) hours as needed for mild pain or headache (fever >/= 101).   [name] adapalene (DIFFERIN) 0.1 % gel Apply topically at bedtime. As needed   . Blood Glucose Monitoring Suppl (ACCU-CHEK AVIVA PLUS) w/Device KIT Use to check blood sugar up to x 2 daily   . pravastatin (PRAVACHOL) 10 MG tablet TAKE  1 TABLET(10 MG) BY MOUTH DAILY   . Selenium Sulfide 2.25 % SHAM APPLY EXTERNALLY TO THE AFFECTED AREA DAILY AS NEEDED FOR IRRITATION   . sildenafil (REVATIO) 20 MG tablet Take 2-5 pills about 30 min prior to sex.    No facility-administered encounter medications on file as of 06/30/2019.    Goals Addressed            This Visit's Progress   . PharmD - "I want to get my A1C down" (pt-stated)       CARE PLAN ENTRY (see longtitudinal plan of care for additional care plan information)  Current Barriers:    [name] Medication Adherence  . Financial Barriers . Knowledge deficits related to nutrition with diabetes  Pharmacist Clinical Goal(s):  [name] Over the next 30 days, patient will continue to work with PharmD to address needs related to address needs related to diabetes management.  . Over the next 30 days, patient will demonstrate maintained medication adherence as evidenced by patient report of adherence  Interventions:   . Performed chart review o Patient seen by PCP on 5/20  For HTN management, advised by try reducing lisinopril 20-12.5 mg to take  tablet daily.   Patient to monitor home BP.  For diabetes medication management,  Patient advised to continue Trulicity 1.5 weekly, metformin 1063m BID, and to work on weaning off current dose of glipizide 2.570mdaily, as blood sugar currently well-controlled . Counsel on importance of blood sugar control and  monitoring ? Patient reports taking: ? Metformin 1000 mg twice daily ? Trulicity 1.5 mg weekly ? Glipizide 5 mg - 1/2 tablet (2.5 mg) daily ? Review recent CBG readings from patient (see below) ? Denies any s/s low blood sugars ? Review with patient instruction from PCP to try weaning off of current dose of glipizide. Myles Rosenthal on importance of home BP monitoring ? Reports taking lisinopril/HCTZ 20-12.5 mg - 1/2 tablet daily as directed ? Reports has been checking his BP readings at home, but does not have this log with him  right now. ? Reports systolic BP has been running 120s-130s ? Encourage patient to call office for BP results outside of established parameters . Counsel on importance of medication adherence ? Confirms continuing to use weekly pillbox adherence tool.   Patient Self Care Activities:  . Self administers all medications as prescribed o Using weekly pillbox as adherence tool . To check blood sugars and keep log of results  Fasting Blood Glucose Before Lunch  4 - June 99 101  5 - June 129   6 - June    7 - June    8 - June    9 - June 119    . To contact pharmacy to order refills of medication . Calls provider office for new concerns or questions. . Patient to eat regular meals  Please see past updates related to this goal by clicking on the "Past Updates" button in the selected goal         Plan  Telephone follow up appointment with care management team member scheduled for: 6/30 at 3 pm to review blood pressure results  Harlow Asa, PharmD, [place] [phone]

## 2019-06-30 NOTE — Patient Instructions (Signed)
Thank you allowing the Chronic Care Management Team to be a part of your care! It was a pleasure speaking with you today!     CCM (Chronic Care Management) Team    Alto Denver RN, MSN, CCM Nurse Care Coordinator  [phone]   [name] PharmD  Clinical Pharmacist  [phone]   Dickie La LCSW Clinical Social Worker [phone]  Visit Information  Goals Addressed            This Visit's Progress    PharmD - "I want to get my A1C down" (pt-stated)       CARE PLAN ENTRY (see longtitudinal plan of care for additional care plan information)  Current Barriers:     Medication Adherence   Financial Barriers  Knowledge deficits related to nutrition with diabetes  Pharmacist Clinical Goal(s):   Over the next 30 days, patient will continue to work with PharmD to address needs related to address needs related to diabetes management.   Over the next 30 days, patient will demonstrate maintained medication adherence as evidenced by patient report of adherence  Interventions:    Performed chart review o Patient seen by PCP on 5/20  For HTN management, advised by try reducing lisinopril 20-12.5 mg to take  tablet daily.   Patient to monitor home BP.  For diabetes medication management,  Patient advised to continue Trulicity 1.5 weekly, metformin 1000mg  BID, and to work on weaning off current dose of glipizide 2.5mg  daily, as blood sugar currently well-controlled  Counsel on importance of blood sugar control and monitoring ? Patient reports taking: ? Metformin 1000 mg twice daily ? Trulicity 1.5 mg weekly ? Glipizide 5 mg - 1/2 tablet (2.5 mg) daily ? Review recent CBG readings from patient (see below) ? Denies any s/s low blood sugars ? Review with patient instruction from PCP to try weaning off of current dose of glipizide.  Counsel on importance of home BP monitoring ? Reports taking lisinopril/HCTZ 20-12.5 mg - 1/2 tablet daily as  directed ? Reports has been checking his BP readings at home, but does not have this log with him right now. ? Reports systolic BP has been running 120s-130s ? Encourage patient to call office for BP results outside of established parameters  Counsel on importance of medication adherence ? Confirms continuing to use weekly pillbox adherence tool.   Patient Self Care Activities:   Self administers all medications as prescribed o Using weekly pillbox as adherence tool  To check blood sugars and keep log of results  Fasting Blood Glucose Before Lunch  4 - June 99 101  5 - June 129   6 - June    7 - June    8 - June    9 - June 119     To contact pharmacy to order refills of medication  Calls provider office for new concerns or questions.  Patient to eat regular meals  Please see past updates related to this goal by clicking on the "Past Updates" button in the selected goal         Patient verbalizes understanding of instructions provided today.   Telephone follow up appointment with care management team member scheduled for: 6/30 at 3 pm  7/30, PharmD, [facility] Clinical Pharmacist [facility] [phone]

## 2019-07-06 ENCOUNTER — Ambulatory Visit: Payer: Self-pay

## 2019-07-06 NOTE — Chronic Care Management (AMB) (Signed)
  Care Management   Follow Up Note   07/06/2019 Name: [name]. MRN: [id number] DOB: [birth year]  Referred by: [name], DO Reason for referral : Care Coordination   [name] is a 74 y.o. year old male who is a primary care patient of [name], DO. The care management team was consulted for assistance with care management and care coordination needs.    Review of patient status, including review of consultants reports, relevant laboratory and other test results, and collaboration with appropriate care team members and the patient's provider was performed as part of comprehensive patient evaluation and provision of chronic care management services.    LCSW completed CCM outreach attempt today but was unable to reach patient successfully. A HIPPA compliant voice message was unable to be left as mailbox was full. LCSW rescheduled CCM SW appointment as well.  Dickie La, BSW, MSW, LCSW [facility] [place]  [facility] [email or web address] Phone: [phone]

## 2019-07-09 ENCOUNTER — Telehealth: Payer: Self-pay | Admitting: Family Medicine

## 2019-07-09 NOTE — Chronic Care Management (AMB) (Signed)
  Care Management   Note  07/09/2019 Name: [name]. MRN: [id number] DOB: 1946/01/13  [name]. is a 74 y.o. year old male who is a primary care patient of [name], DO and is actively engaged with the care management team. I reached out to [name]. by phone today to assist with re-scheduling a follow up visit with the Pharmacist  Follow up plan: Unsuccessful telephone outreach attempt made.  The care management team will reach out to the patient again over the next 7 days.  If patient returns call to provider office, please advise to call Embedded Care Management Care Guide Penne Lash  at [phone]  Penne Lash, RMA Care Guide, Embedded Care Coordination [facility]  [place], [place] [address] Direct Dial: [phone] [email or web address]: [email or web address]

## 2019-07-12 NOTE — Chronic Care Management (AMB) (Signed)
°  Care Management   Note  07/12/2019 Name: [name]. MRN: [id number] DOB: [birth year]  [name]. is a 74 y.o. year old male who is a primary care patient of [name], DO and is actively engaged with the care management team. I reached out to [name]. by phone today to assist with re-scheduling a follow up visit with the Pharmacist  Follow up plan: Unsuccessful telephone outreach attempt made. The care management team will reach out to the patient again over the next 7 days.  If patient returns call to provider office, please advise to call Embedded Care Management Care Guide Penne Lash  at [phone]  Penne Lash, RMA Care Guide, Embedded Care Coordination [facility]  [place], [place] [address] Direct Dial: [phone] [email or web address] Website: [email or web address]

## 2019-07-15 ENCOUNTER — Ambulatory Visit: Payer: Medicare HMO

## 2019-07-15 ENCOUNTER — Other Ambulatory Visit: Payer: Self-pay

## 2019-07-15 LAB — HM DIABETES EYE EXAM

## 2019-07-15 NOTE — Chronic Care Management (AMB) (Signed)
  Care Management   Note  07/15/2019 Name: [name]. MRN: [id number] DOB: 1945/07/15  [name]. is a 74 y.o. year old male who is a primary care patient of [name], DO and is actively engaged with the care management team. I reached out to [name]. by phone today to assist with re-scheduling a follow up visit with the Pharmacist  Follow up plan: Unsuccessful telephone outreach attempt made. A HIPPA compliant phone message was left for the patient providing contact information and requesting a return call.  If patient returns call to provider office, please advise to call Embedded Care Management Care Guide Penne Lash  at [phone]  Penne Lash, RMA Care Guide, Embedded Care Coordination [facility]  [place], [place] [address] Direct Dial: [phone] [name] Website: [email or web address]

## 2019-07-16 NOTE — Chronic Care Management (AMB) (Signed)
  Care Management   Note  07/16/2019 Name: [name]. MRN: [id number] DOB: [birth year]  [name]. is a 74 y.o. year old male who is a primary care patient of [name], DO and is actively engaged with the care management team. I reached out to [name]. by phone today to assist with re-scheduling a follow up visit with the Pharmacist  Follow up plan: Telephone appointment with care management team member scheduled for:08/09/2019  Penne Lash, RMA Care Guide, Embedded Care Coordination [facility]  [place], [place] [address] Direct Dial: [phone] [email or web address]: [email or web address]

## 2019-07-16 NOTE — Progress Notes (Signed)
g

## 2019-07-21 ENCOUNTER — Telehealth: Payer: Self-pay

## 2019-07-25 ENCOUNTER — Other Ambulatory Visit: Payer: Self-pay | Admitting: Family Medicine

## 2019-07-25 DIAGNOSIS — E785 Hyperlipidemia, unspecified: Secondary | ICD-10-CM

## 2019-07-25 NOTE — Telephone Encounter (Signed)
Requested Prescriptions  Pending Prescriptions Disp Refills  . pravastatin (PRAVACHOL) 10 MG tablet [Pharmacy Med Name: PRAVASTATIN 10MG  TABLETS] 90 tablet 1    Sig: TAKE 1 TABLET BY MOUTH DAILY     Cardiovascular:  Antilipid - Statins Passed - 07/25/2019  3:09 PM      Passed - Total Cholesterol in normal range and within 360 days    Cholesterol, Total  Date Value Ref Range Status  05/10/2015 204 (H) 100 - 199 mg/dL Final   Cholesterol  Date Value Ref Range Status  12/03/2018 179 <200 mg/dL Final         Passed - LDL in normal range and within 360 days    LDL Cholesterol (Calc)  Date Value Ref Range Status  12/03/2018 84 mg/dL (calc) Final    Comment:    Reference range: <100 . Desirable range <100 mg/dL for primary prevention;   <70 mg/dL for patients with CHD or diabetic patients  with > or = 2 CHD risk factors. 13/12/2018 LDL-C is now calculated using the Martin-Hopkins  calculation, which is a validated novel method providing  better accuracy than the Friedewald equation in the  estimation of LDL-C.  [name] et al. Horald Pollen. 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, without long-term current use of insulin [facility])   [facility], DO   3 months ago Controlled type 2 diabetes mellitus with both eyes affected by mild nonproliferative  retinopathy without macular edema, without long-term current use of insulin [facility])   [facility], DO   7 months ago Annual physical exam   [facility], DO   8 months ago COVID-19   [facility] [place], [place], DO   8 months ago Pneumonia due to COVID-19 virus   [facility], NP             Pt due for blood work 11/21

## 2019-08-05 ENCOUNTER — Telehealth: Payer: Self-pay | Admitting: Family Medicine

## 2019-08-05 DIAGNOSIS — E785 Hyperlipidemia, unspecified: Secondary | ICD-10-CM

## 2019-08-05 DIAGNOSIS — I10 Essential (primary) hypertension: Secondary | ICD-10-CM

## 2019-08-05 DIAGNOSIS — R351 Nocturia: Secondary | ICD-10-CM

## 2019-08-05 DIAGNOSIS — Z0000 Encounter for general adult medical examination without abnormal findings: Secondary | ICD-10-CM

## 2019-08-05 DIAGNOSIS — E113293 Type 2 diabetes mellitus with mild nonproliferative diabetic retinopathy without macular edema, bilateral: Secondary | ICD-10-CM

## 2019-08-05 NOTE — Telephone Encounter (Signed)
Signed lab orders.

## 2019-08-09 ENCOUNTER — Ambulatory Visit (INDEPENDENT_AMBULATORY_CARE_PROVIDER_SITE_OTHER): Payer: Medicare HMO | Admitting: Pharmacist

## 2019-08-09 ENCOUNTER — Other Ambulatory Visit: Payer: Self-pay | Admitting: Family Medicine

## 2019-08-09 DIAGNOSIS — E113293 Type 2 diabetes mellitus with mild nonproliferative diabetic retinopathy without macular edema, bilateral: Secondary | ICD-10-CM | POA: Diagnosis not present

## 2019-08-09 DIAGNOSIS — I10 Essential (primary) hypertension: Secondary | ICD-10-CM | POA: Diagnosis not present

## 2019-08-09 NOTE — Chronic Care Management (AMB) (Signed)
Chronic Care Management   Follow Up Note   08/09/2019 Name: Priest Lockridge. MRN: [id number] DOB: September 14, 1945  Referred by: [name], DO Reason for referral : Chronic Care Management (Patient Phone Call)   Wilkins Elpers. is a 74 y.o. year old male who is a primary care patient of [name], DO. The CCM team was consulted for assistance with chronic disease management and care coordination needs.    I reached out to [name]. by phone today.   Place coordination of care call to [employer].  Review of patient status, including review of consultants reports, relevant laboratory and other test results, and collaboration with appropriate care team members and the patient's provider was performed as part of comprehensive patient evaluation and provision of chronic care management services.      Outpatient Encounter Medications as of 08/09/2019  Medication Sig Note  . Dulaglutide (TRULICITY) 1.5 DJ/5.7SV SOPN Inject 1.5 mg into the skin once a week. 11/19/2018: On Mondays  . lisinopril-hydrochlorothiazide (ZESTORETIC) 20-12.5 MG tablet Take 1 tablet by mouth daily. (Patient taking differently: Take 0.5 tablets by mouth daily. )   . metFORMIN (GLUCOPHAGE) 1000 MG tablet Take 1 tablet (1,000 mg total) by mouth 2 (two) times daily with a meal.   . ACCU-CHEK AVIVA PLUS test strip Check sugar up to 2 x daily   . Accu-Chek FastClix Lancets MISC Check sugar up to 2 x daily   . Accu-Chek Softclix Lancets lancets Check sugar up to 2 x daily   . acetaminophen (TYLENOL) 325 MG tablet Take 2 tablets (650 mg total) by mouth every 6 (six) hours as needed for mild pain or headache (fever >/= 101).   [name] adapalene (DIFFERIN) 0.1 % gel Apply topically at bedtime. As needed   . Blood Glucose Monitoring Suppl (ACCU-CHEK AVIVA PLUS) w/Device KIT Use to check blood sugar up to x 2 daily   . glipiZIDE (GLUCOTROL) 5 MG tablet TAKE 1/2 TABLET(HALF TABLET) BY MOUTH  DAILY BEFORE BREAKFAST (Patient not taking: Reported on 08/09/2019)   . pravastatin (PRAVACHOL) 10 MG tablet TAKE 1 TABLET BY MOUTH DAILY   . Selenium Sulfide 2.25 % SHAM APPLY EXTERNALLY TO THE AFFECTED AREA DAILY AS NEEDED FOR IRRITATION   . sildenafil (REVATIO) 20 MG tablet Take 2-5 pills about 30 min prior to sex.    No facility-administered encounter medications on file as of 08/09/2019.    Goals Addressed              This Visit's Progress   .  PharmD - "I want to get my A1C down" (pt-stated)        CARE PLAN ENTRY (see longtitudinal plan of care for additional care plan information)  Current Barriers:    [name] Medication Adherence  . Financial Barriers . Knowledge deficits related to nutrition with diabetes  Pharmacist Clinical Goal(s):  [name] Over the next 30 days, patient will continue to work with PharmD to address needs related to address needs related to diabetes management.  . Over the next 30 days, patient will demonstrate maintained medication adherence as evidenced by patient report of adherence  Interventions:   . Counsel on importance of blood sugar control and monitoring ? Patient reports taking: ? Metformin 1000 mg twice daily ? Trulicity 1.5 mg weekly ? Reports has stopped glipizide as directed by PCP to try weaning off ? Reports recent CBGs ranging from 90s-120s, but unable to provide specific readings as not currently home ?  Reports missed dose of Trulicity today - out of medication. Reports called patient assistance program a week ago to reorder, but was told the refill was too soon. Myles Rosenthal on importance of home BP monitoring ? Reports taking lisinopril/HCTZ 20-12.5 mg - 1/2 tablet daily as directed ? Reports has been checking his BP readings at home, but does not have this log with him right now. ? Will review blood pressure readings during next appointment . Counsel on importance of medication adherence ? Confirms continuing to use weekly pillbox adherence  tool.  . Place coordination of care call to [place] patient assistance program/[facility] ? Speak with representative Nita who confirms patient's Trulicity is eligible refilled, will be filled and delivered to office by 7/23.   Patient Self Care Activities:  . Self administers all medications as prescribed o Using weekly pillbox as adherence tool . To check blood sugars and keep log of results  . To contact pharmacy to order refills of medication . Calls provider office for new concerns or questions. . Patient to eat regular meals  Please see past updates related to this goal by clicking on the "Past Updates" button in the selected goal         Plan  Telephone follow up appointment with care management team member scheduled for: 8/9 3:30 pm  Harlow Asa, PharmD, [facility] [phone]

## 2019-08-09 NOTE — Patient Instructions (Signed)
Thank you allowing the Chronic Care Management Team to be a part of your care! It was a pleasure speaking with you today!     CCM (Chronic Care Management) Team    Alto Denver RN, MSN, CCM Nurse Care Coordinator  [phone]   [name] PharmD  Clinical Pharmacist  [phone]   Dickie La LCSW Clinical Social Worker [phone]  Visit Information  Goals Addressed              This Visit's Progress   .  PharmD - "I want to get my A1C down" (pt-stated)        CARE PLAN ENTRY (see longtitudinal plan of care for additional care plan information)  Current Barriers:    [name] Medication Adherence  . Financial Barriers . Knowledge deficits related to nutrition with diabetes  Pharmacist Clinical Goal(s):  [name] Over the next 30 days, patient will continue to work with PharmD to address needs related to address needs related to diabetes management.  . Over the next 30 days, patient will demonstrate maintained medication adherence as evidenced by patient report of adherence  Interventions:   . Counsel on importance of blood sugar control and monitoring ? Patient reports taking: ? Metformin 1000 mg twice daily ? Trulicity 1.5 mg weekly ? Reports has stopped glipizide as directed by PCP to try weaning off ? Reports recent CBGs ranging from 90s-120s, but unable to provide specific readings as not currently home ? Reports missed dose of Trulicity today - out of medication. Reports called patient assistance program a week ago to reorder, but was told the refill was too soon. Remus Loffler on importance of home BP monitoring ? Reports taking lisinopril/HCTZ 20-12.5 mg - 1/2 tablet daily as directed ? Reports has been checking his BP readings at home, but does not have this log with him right now. ? Will review blood pressure readings during next appointment . Counsel on importance of medication adherence ? Confirms continuing to use weekly pillbox adherence tool.  . Place  coordination of care call to [name] patient assistance program/[facility] ? Speak with representative Nita who confirms patient's Trulicity is eligible refilled, will be filled and delivered to office by 7/23.   Patient Self Care Activities:  . Self administers all medications as prescribed o Using weekly pillbox as adherence tool . To check blood sugars and keep log of results  . To contact pharmacy to order refills of medication . Calls provider office for new concerns or questions. . Patient to eat regular meals  Please see past updates related to this goal by clicking on the "Past Updates" button in the selected goal         Patient verbalizes understanding of instructions provided today.   Telephone follow up appointment with care management team member scheduled for: 8/9 3:30 pm  [name], PharmD, [facility] Clinical Pharmacist [facility] [employer] [phone]

## 2019-08-13 ENCOUNTER — Ambulatory Visit (INDEPENDENT_AMBULATORY_CARE_PROVIDER_SITE_OTHER): Payer: Medicare HMO | Admitting: Family Medicine

## 2019-08-13 ENCOUNTER — Other Ambulatory Visit: Payer: Self-pay

## 2019-08-13 ENCOUNTER — Encounter: Payer: Self-pay | Admitting: Family Medicine

## 2019-08-13 VITALS — BP 149/78 | HR 61 | Temp 98.7°F | Resp 18

## 2019-08-13 DIAGNOSIS — J039 Acute tonsillitis, unspecified: Secondary | ICD-10-CM | POA: Insufficient documentation

## 2019-08-13 DIAGNOSIS — R221 Localized swelling, mass and lump, neck: Secondary | ICD-10-CM | POA: Diagnosis not present

## 2019-08-13 DIAGNOSIS — J3501 Chronic tonsillitis: Secondary | ICD-10-CM

## 2019-08-13 DIAGNOSIS — J029 Acute pharyngitis, unspecified: Secondary | ICD-10-CM

## 2019-08-13 LAB — POCT RAPID STREP A (OFFICE): Rapid Strep A Screen: NEGATIVE

## 2019-08-13 MED ORDER — AMOXICILLIN-POT CLAVULANATE 875-125 MG PO TABS: 1.0000 | ORAL_TABLET | Freq: Two times a day (BID) | ORAL | 0 refills | Status: DC

## 2019-08-13 MED ORDER — PREDNISONE 20 MG PO TABS: 40.0000 mg | ORAL_TABLET | Freq: Every day | ORAL | 0 refills | Status: AC

## 2019-08-13 NOTE — Progress Notes (Signed)
Subjective:    Patient ID: Christopher Bruins., male    DOB: [birth year], 74 y.o.   MRN: [id number]  Christopher Drumwright. is a 74 y.o. male presenting on 08/13/2019 for Sore Throat (w/difficulty swallowing x3 days)   HPI  [name] presents to clinic with concerns of sore throat and difficulty swallowing x 3 days.  Has had both of his COVID vaccines.  Reports temperature of 97.3 orally this morning.  Has tried a chloroseptic spray without relief of symptoms.  Reports worsening of symptoms first thing in the morning.  Denies fevers, change in taste/smell, runny nose, rhinorrhea, pain in ears, teeth, cough, SOB, CP, abdominal pain, n/v/d.  Denies any known sick contacts.  Depression screen [facility] 2/9 06/10/2019 12/10/2018 09/02/2018  Decreased Interest 0 0 0  Down, Depressed, Hopeless 0 0 0  PHQ - 2 Score 0 0 0  Altered sleeping - - -  Tired, decreased energy - - -  Change in appetite - - -  Feeling bad or failure about yourself  - - -  Trouble concentrating - - -  Moving slowly or fidgety/restless - - -  Suicidal thoughts - - -  PHQ-9 Score - - -  Difficult doing work/chores - - -    Social History   Tobacco Use  . Smoking status: Current Some Day Smoker    Types: Cigarettes  . Smokeless tobacco: Never Used  . Tobacco comment: pack of cigarettes last 1-2 months - doesnt inhale  Vaping Use  . Vaping Use: Never used  Substance Use Topics  . Alcohol use: Yes    Alcohol/week: 1.0 standard drink    Types: 1 Cans of beer per week    Comment: 1-2 beers week   . Drug use: No    Review of Systems  Constitutional: Negative.   HENT: Positive for sore throat and trouble swallowing. Negative for congestion, dental problem, drooling, ear discharge, ear pain, facial swelling, hearing loss, mouth sores, nosebleeds, postnasal drip, rhinorrhea, sinus pressure, sinus pain, sneezing, tinnitus and voice change.   Eyes: Negative.   Respiratory: Negative.   Cardiovascular: Negative.   Gastrointestinal:  Negative.   Endocrine: Negative.   Genitourinary: Negative.   Musculoskeletal: Negative.   Skin: Negative.   Allergic/Immunologic: Negative.   Neurological: Negative.   Hematological: Negative.   Psychiatric/Behavioral: Negative.    Per HPI unless specifically indicated above     Objective:    BP (!) 149/78 (BP Location: Left Arm, Patient Position: Sitting, Cuff Size: Normal)   Pulse 61   Temp 98.7 F (37.1 C) (Oral)   Resp 18   Wt Readings from Last 3 Encounters:  06/10/19 187 lb (84.8 kg)  12/10/18 184 lb (83.5 kg)  10/22/18 184 lb 11.9 oz (83.8 kg)    Physical Exam Vitals reviewed.  Constitutional:      General: He is not in acute distress.    Appearance: Normal appearance. He is well-developed and well-groomed. He is not ill-appearing or toxic-appearing.  HENT:     Head: Normocephalic and atraumatic.     Nose: Congestion present. No rhinorrhea.     Comments: Lesia Sago is in place, covering mouth and nose.    Mouth/Throat:     Lips: Pink. No lesions.     Tongue: No lesions.     Palate: No mass and lesions.     Pharynx: Posterior oropharyngeal erythema present. No oropharyngeal exudate.     Tonsils: Tonsillar exudate present. No tonsillar abscesses. 4+ on the right.  4+ on the left.  Eyes:     General:        Right eye: No discharge.        Left eye: No discharge.     Extraocular Movements: Extraocular movements intact.     Conjunctiva/sclera: Conjunctivae normal.     Pupils: Pupils are equal, round, and reactive to light.  Neck:     Thyroid: No thyroid mass, thyromegaly or thyroid tenderness.  Cardiovascular:     Pulses: Normal pulses.  Pulmonary:     Effort: Pulmonary effort is normal. No respiratory distress.  Lymphadenopathy:     Cervical: Cervical adenopathy present.     Right cervical: Superficial cervical adenopathy present. No posterior cervical adenopathy.    Left cervical: Superficial cervical adenopathy present. No posterior cervical adenopathy.   Skin:    General: Skin is warm and dry.     Capillary Refill: Capillary refill takes less than 2 seconds.  Neurological:     General: No focal deficit present.     Mental Status: He is alert and oriented to person, place, and time.  Psychiatric:        Attention and Perception: Attention and perception normal.        Mood and Affect: Mood and affect normal.        Speech: Speech normal.        Behavior: Behavior normal. Behavior is cooperative.        Thought Content: Thought content normal.        Cognition and Memory: Cognition and memory normal.    Results for orders placed or performed in visit on 08/13/19  POCT rapid strep A  Result Value Ref Range   Rapid Strep A Screen Negative Negative      Assessment & Plan:   Problem List Items Addressed This Visit      Respiratory   Infection of tonsil    Tonsils +4 swelling with white exudate, POCT strep testing negative, likely strep based on exam and evaluation.  Will treat with Augmentin 875-125mg  1 tablet 2x per day for the next 10 days and Prednisone 40mg  daily x 5 days.  Discussed augmentin may be hard on his stomach and to take this with food to prevent stomach upset.  Educated that the prednisone may increase his glucose, to be aware of this.  Plan: 1. Begin Augmentin 875-125mg  BID x 10 days 2. Begin Prednisone 40mg  daily x 5 days 3. RTC if symptoms worsen or fail to improve with current treatment.      Relevant Medications   amoxicillin-clavulanate (AUGMENTIN) 875-125 MG tablet     Other   Throat swelling - Primary    See infection of tonsil A/P      Relevant Medications   predniSONE (DELTASONE) 20 MG tablet    Other Visit Diagnoses    Sore throat       Relevant Orders   POCT rapid strep A (Completed)      Meds ordered this encounter  Medications  . predniSONE (DELTASONE) 20 MG tablet    Sig: Take 2 tablets (40 mg total) by mouth daily with breakfast for 5 days.    Dispense:  10 tablet    Refill:  0  .  amoxicillin-clavulanate (AUGMENTIN) 875-125 MG tablet    Sig: Take 1 tablet by mouth 2 (two) times daily.    Dispense:  20 tablet    Refill:  0      Follow up plan: Return if symptoms worsen or  fail to improve.   Charlaine Dalton, FNP Family Nurse Practitioner [facility] 08/13/2019, 4:45 PM

## 2019-08-13 NOTE — Assessment & Plan Note (Signed)
Tonsils +4 swelling with white exudate, POCT strep testing negative, likely strep based on exam and evaluation.  Will treat with Augmentin 875-125mg  1 tablet 2x per day for the next 10 days and Prednisone 40mg  daily x 5 days.  Discussed augmentin may be hard on his stomach and to take this with food to prevent stomach upset.  Educated that the prednisone may increase his glucose, to be aware of this.  Plan: 1. Begin Augmentin 875-125mg  BID x 10 days 2. Begin Prednisone 40mg  daily x 5 days 3. RTC if symptoms worsen or fail to improve with current treatment.

## 2019-08-13 NOTE — Patient Instructions (Signed)
As we discussed, you have an infection of your tonsils with throat swelling.  I have sent in a prescription for Augmentin 875-125mg  to take 1 tablet 2x per day for the next 10 days.  I have sent in a prescription for prednisone to take 40mg  daily for 5 days.  This medication will help reduce the swelling in your throat.  It will also raise your glucose levels for the next couple of days and may raise your A1C.  Be sure to bring this up at your next visit with Dr. .  We will plan to see you back if your symptoms worsen or fail to improve.  You will receive a survey after today's visit either digitally by e-mail or paper by Althea Charon. Your experiences and feedback matter to [employer].  Please respond so we know how we are doing as we provide care for you.  Call us with any questions/concerns/needs.  It is my goal to be available to you for your health concerns.  Thanks for choosing me to be a partner in your healthcare needs!  [place], FNP-C Family Nurse Practitioner [facility] Phone: [phone]

## 2019-08-13 NOTE — Assessment & Plan Note (Signed)
See infection of tonsil A/P

## 2019-08-26 ENCOUNTER — Telehealth: Payer: Self-pay

## 2019-08-30 ENCOUNTER — Ambulatory Visit (INDEPENDENT_AMBULATORY_CARE_PROVIDER_SITE_OTHER): Payer: Medicare HMO | Admitting: Pharmacist

## 2019-08-30 DIAGNOSIS — E113293 Type 2 diabetes mellitus with mild nonproliferative diabetic retinopathy without macular edema, bilateral: Secondary | ICD-10-CM | POA: Diagnosis not present

## 2019-08-30 DIAGNOSIS — E1165 Type 2 diabetes mellitus with hyperglycemia: Secondary | ICD-10-CM | POA: Diagnosis not present

## 2019-08-30 DIAGNOSIS — E785 Hyperlipidemia, unspecified: Secondary | ICD-10-CM | POA: Diagnosis not present

## 2019-08-30 DIAGNOSIS — I10 Essential (primary) hypertension: Secondary | ICD-10-CM

## 2019-08-30 DIAGNOSIS — E1169 Type 2 diabetes mellitus with other specified complication: Secondary | ICD-10-CM | POA: Diagnosis not present

## 2019-08-30 DIAGNOSIS — E1142 Type 2 diabetes mellitus with diabetic polyneuropathy: Secondary | ICD-10-CM

## 2019-08-30 NOTE — Chronic Care Management (AMB) (Signed)
Chronic Care Management   Follow Up Note   08/30/2019 Name: [name]. MRN: [id number] DOB: 1945-12-21  Referred by: [name], DO Reason for referral : Chronic Care Management (Patient Phone Call)   [name]. is a 74 y.o. year old male who is a primary care patient of [name], DO. The CCM team was consulted for assistance with chronic disease management and care coordination needs.    I reached out to [name]. by phone today.   Review of patient status, including review of consultants reports, relevant laboratory and other test results, and collaboration with appropriate care team members and the patient's provider was performed as part of comprehensive patient evaluation and provision of chronic care management services.     Outpatient Encounter Medications as of 08/30/2019  Medication Sig Note  . Dulaglutide (TRULICITY) 1.5 WN/4.6EV SOPN Inject 1.5 mg into the skin once a week. 11/19/2018: On Mondays  . lisinopril-hydrochlorothiazide (ZESTORETIC) 20-12.5 MG tablet Take 1 tablet by mouth daily. (Patient taking differently: Take 0.5 tablets by mouth daily. )   . metFORMIN (GLUCOPHAGE) 1000 MG tablet Take 1 tablet (1,000 mg total) by mouth 2 (two) times daily with a meal.   . pravastatin (PRAVACHOL) 10 MG tablet TAKE 1 TABLET BY MOUTH DAILY   . ACCU-CHEK AVIVA PLUS test strip Check sugar up to 2 x daily   . Accu-Chek FastClix Lancets MISC Check sugar up to 2 x daily   . Accu-Chek Softclix Lancets lancets Check sugar up to 2 x daily   . acetaminophen (TYLENOL) 325 MG tablet Take 2 tablets (650 mg total) by mouth every 6 (six) hours as needed for mild pain or headache (fever >/= 101).   [name] adapalene (DIFFERIN) 0.1 % gel Apply topically at bedtime. As needed   . Blood Glucose Monitoring Suppl (ACCU-CHEK AVIVA PLUS) w/Device KIT Use to check blood sugar up to x 2 daily   . Selenium Sulfide 2.25 % SHAM APPLY EXTERNALLY TO THE AFFECTED AREA  DAILY AS NEEDED FOR IRRITATION (Patient not taking: Reported on 08/13/2019)   . sildenafil (REVATIO) 20 MG tablet Take 2-5 pills about 30 min prior to sex.   . [DISCONTINUED] amoxicillin-clavulanate (AUGMENTIN) 875-125 MG tablet Take 1 tablet by mouth 2 (two) times daily.    No facility-administered encounter medications on file as of 08/30/2019.    Goals Addressed              This Visit's Progress   .  PharmD - "I want to get my A1C down" (pt-stated)        CARE PLAN ENTRY (see longtitudinal plan of care for additional care plan information)  Current Barriers:    . Financial Barriers . Knowledge deficits related to nutrition with diabetes  Pharmacist Clinical Goal(s):  [name] Over the next 30 days, patient will continue to work with PharmD to address needs related to address needs related to diabetes management.  . Over the next 30 days, patient will demonstrate maintained medication adherence as evidenced by patient report of adherence  Interventions:   . Perform chart review. Patient seen by Cyndia Skeeters, FNP in office on 7/23 for infection of tonsil . Follow up with patient regarding infection ? Reports completed courses of both Augmentin and prednisone as prescribed by FNP.  ? Reports infection resolved. Myles Rosenthal on importance of blood sugar control and monitoring ? Patient reports taking: ? Metformin 1000 mg twice daily ? Trulicity 1.5 mg weekly on Mondays ?  Reports recent morning fasting CBGs ranging from 117-130s ? Confirms received refill of Trulicity from patient assistance program . Counsel on importance of home BP monitoring ? Reports taking lisinopril/HCTZ 20-12.5 mg - 1/2 tablet daily as directed ? Reports has been checking his BP readings at home ? 8/7: 146/76, HR 69 (but states had just mowed yard) ? 8/5: 125/71, HR 62  ? Counsel on blood pressure monitoring technique ? Counsel on impact of salt/sodium on blood pressure. Encourage patient to review nutrition  labels for sodium content ? Encourage patient to continue to monitor BP outside office, bring log to medical appointments and if persistently >140/90 or new symptoms notify office sooner . Counsel on importance of medication adherence ? Confirms continuing to use weekly pillbox adherence tool.   . Will mail patient a blood pressure log and handout on blood pressure monitoring technique as requested  Patient Self Care Activities:  . Self administers all medications as prescribed o Using weekly pillbox as adherence tool . To check blood sugars and keep log of results . To contact pharmacy to order refills of medication . Calls provider office for new concerns or questions. . Patient to eat regular meals  Please see past updates related to this goal by clicking on the "Past Updates" button in the selected goal         Plan  Telephone follow up appointment with care management team member scheduled for: 10/11 3:30 pm  Harlow Asa, PharmD, [place] [phone]

## 2019-08-30 NOTE — Patient Instructions (Signed)
Thank you allowing the Chronic Care Management Team to be a part of your care! It was a pleasure speaking with you today!     CCM (Chronic Care Management) Team    Alto Denver RN, MSN, CCM Nurse Care Coordinator  [phone]   [name] PharmD  Clinical Pharmacist  [phone]   Dickie La LCSW Clinical Social Worker [phone]  Visit Information  Goals Addressed              This Visit's Progress   .  PharmD - "I want to get my A1C down" (pt-stated)        CARE PLAN ENTRY (see longtitudinal plan of care for additional care plan information)  Current Barriers:    . Financial Barriers . Knowledge deficits related to nutrition with diabetes  Pharmacist Clinical Goal(s):  [name] Over the next 30 days, patient will continue to work with PharmD to address needs related to address needs related to diabetes management.  . Over the next 30 days, patient will demonstrate maintained medication adherence as evidenced by patient report of adherence  Interventions:   . Perform chart review. Patient seen by Danielle Rankin, FNP in office on 7/23 for infection of tonsil . Follow up with patient regarding infection ? Reports completed courses of both Augmentin and prednisone as prescribed by FNP.  ? Reports infection resolved. Remus Loffler on importance of blood sugar control and monitoring ? Patient reports taking: ? Metformin 1000 mg twice daily ? Trulicity 1.5 mg weekly on Mondays ? Reports recent morning fasting CBGs ranging from 117-130s ? Confirms received refill of Trulicity from patient assistance program . Counsel on importance of home BP monitoring ? Reports taking lisinopril/HCTZ 20-12.5 mg - 1/2 tablet daily as directed ? Reports has been checking his BP readings at home ? 8/7: 146/76, HR 69 (but states had just mowed yard) ? 8/5: 125/71, HR 62  ? Counsel on blood pressure monitoring technique ? Counsel on impact of salt/sodium on blood pressure. Encourage  patient to review nutrition labels for sodium content ? Encourage patient to continue to monitor BP outside office, bring log to medical appointments and if persistently >140/90 or new symptoms notify office sooner . Counsel on importance of medication adherence ? Confirms continuing to use weekly pillbox adherence tool.   . Will mail patient a blood pressure log and handout on blood pressure monitoring technique as requested  Patient Self Care Activities:  . Self administers all medications as prescribed o Using weekly pillbox as adherence tool . To check blood sugars and keep log of results . To contact pharmacy to order refills of medication . Calls provider office for new concerns or questions. . Patient to eat regular meals  Please see past updates related to this goal by clicking on the "Past Updates" button in the selected goal         Patient verbalizes understanding of instructions provided today.   Telephone follow up appointment with care management team member scheduled for: 10/11 3:30 pm  [name], PharmD, [facility] Clinical Pharmacist [facility] Medical [employer] [phone]

## 2019-09-13 DIAGNOSIS — H2513 Age-related nuclear cataract, bilateral: Secondary | ICD-10-CM | POA: Diagnosis not present

## 2019-09-13 DIAGNOSIS — E119 Type 2 diabetes mellitus without complications: Secondary | ICD-10-CM | POA: Diagnosis not present

## 2019-09-13 DIAGNOSIS — H35372 Puckering of macula, left eye: Secondary | ICD-10-CM | POA: Diagnosis not present

## 2019-09-13 LAB — HM DIABETES EYE EXAM

## 2019-09-14 ENCOUNTER — Encounter: Payer: Self-pay | Admitting: Family Medicine

## 2019-09-21 ENCOUNTER — Ambulatory Visit: Payer: Self-pay | Admitting: Licensed Clinical Social Worker

## 2019-09-21 DIAGNOSIS — E1169 Type 2 diabetes mellitus with other specified complication: Secondary | ICD-10-CM

## 2019-09-21 DIAGNOSIS — E1142 Type 2 diabetes mellitus with diabetic polyneuropathy: Secondary | ICD-10-CM | POA: Diagnosis not present

## 2019-09-21 DIAGNOSIS — E113293 Type 2 diabetes mellitus with mild nonproliferative diabetic retinopathy without macular edema, bilateral: Secondary | ICD-10-CM

## 2019-09-21 DIAGNOSIS — E785 Hyperlipidemia, unspecified: Secondary | ICD-10-CM

## 2019-09-21 DIAGNOSIS — I10 Essential (primary) hypertension: Secondary | ICD-10-CM

## 2019-09-21 DIAGNOSIS — E1165 Type 2 diabetes mellitus with hyperglycemia: Secondary | ICD-10-CM | POA: Diagnosis not present

## 2019-09-21 DIAGNOSIS — IMO0002 Reserved for concepts with insufficient information to code with codable children: Secondary | ICD-10-CM

## 2019-09-21 NOTE — Chronic Care Management (AMB) (Signed)
Chronic Care Management    Clinical Social Work Follow Up Note  09/21/2019 Name: [name]. MRN: [id number] DOB: 15-Feb-1945  [name]. is a 74 y.o. year old male who is a primary care patient of [name], DO. The CCM team was consulted for assistance with [employer] .   Review of patient status, including review of consultants reports, other relevant assessments, and collaboration with appropriate care team members and the patient's provider was performed as part of comprehensive patient evaluation and provision of chronic care management services.    SDOH (Social Determinants of Health) assessments performed: Yes    Outpatient Encounter Medications as of 09/21/2019  Medication Sig Note  . ACCU-CHEK AVIVA PLUS test strip Check sugar up to 2 x daily   . Accu-Chek FastClix Lancets MISC Check sugar up to 2 x daily   . Accu-Chek Softclix Lancets lancets Check sugar up to 2 x daily   . acetaminophen (TYLENOL) 325 MG tablet Take 2 tablets (650 mg total) by mouth every 6 (six) hours as needed for mild pain or headache (fever >/= 101).   [name] adapalene (DIFFERIN) 0.1 % gel Apply topically at bedtime. As needed   . Blood Glucose Monitoring Suppl (ACCU-CHEK AVIVA PLUS) w/Device KIT Use to check blood sugar up to x 2 daily   . Dulaglutide (TRULICITY) 1.5 BJ/6.2GB SOPN Inject 1.5 mg into the skin once a week. 11/19/2018: On Mondays  . lisinopril-hydrochlorothiazide (ZESTORETIC) 20-12.5 MG tablet Take 1 tablet by mouth daily. (Patient taking differently: Take 0.5 tablets by mouth daily. )   . metFORMIN (GLUCOPHAGE) 1000 MG tablet Take 1 tablet (1,000 mg total) by mouth 2 (two) times daily with a meal.   . pravastatin (PRAVACHOL) 10 MG tablet TAKE 1 TABLET BY MOUTH DAILY   . Selenium Sulfide 2.25 % SHAM APPLY EXTERNALLY TO THE AFFECTED AREA DAILY AS NEEDED FOR IRRITATION (Patient not taking: Reported on 08/13/2019)   . sildenafil (REVATIO) 20 MG tablet Take 2-5 pills  about 30 min prior to sex.    No facility-administered encounter medications on file as of 09/21/2019.     Goals Addressed    .  COMPLETED: "I need more help in the home because I live alone." (pt-stated)        Current Barriers:  . Financial constraints related to affording a caregiver and managing health care expenses . Limited social support . ADL IADL limitations . Social Isolation . Limited access to caregiver  Clinical Social Work Clinical Goal(s):  [name] Over the next 120 days, patient will work with SW to address concerns related to gaining financial and personal care service resource education  Interventions: . Patient interviewed and appropriate assessments performed . Discussed plans with patient for ongoing care management follow up and provided patient with direct contact information for care management team . Assisted patient/caregiver with obtaining information about health plan benefits . A voluntary and extensive discussion about advanced care planning including explanation and discussion of advanced was undertaken with the patient. Explanation regarding healthcare proxy and living will was reviewed and packet with forms with explanation of how to fill them out was given.   [name] Resource education provided on [employer] and [place]. . Patient interested in being put on the wait list for [facility]. LCSW will complete this referral within 45 days. UPDATE-- [facility] referral submitted on 03/02/2019. Patient reports that he no longer needs in home support as he works full-time now. Patient denies  any further social work needs at this time. LCSW will close case at this time due to no further social work needs/goals.   Patient Self Care Activities:  . Attends all scheduled provider appointments . Calls provider office for new concerns or questions .  [name] Unable to independently self pay for personal care services   Please see past updates  related to this goal by clicking on the "Past Updates" button in the selected goal       Follow Up Plan: Embedded care coordination team will continue to follow patient progress and assist with care coordination needs. LCSW will close case at this time but CCM Pharmacist is still involved.   Eula Fried, BSW, MSW, [email or web address] Phone: [phone]

## 2019-11-01 ENCOUNTER — Telehealth: Payer: Self-pay

## 2019-11-17 ENCOUNTER — Telehealth: Payer: Self-pay

## 2019-11-17 ENCOUNTER — Telehealth: Payer: Self-pay | Admitting: Pharmacist

## 2019-11-17 NOTE — Progress Notes (Signed)
  Chronic Care Management   Outreach Note  11/17/2019 Name: Eladio Dentremont. MRN: [id number] DOB: March 31, 1945  Referred by: [name], DO Reason for referral : No chief complaint on file.  Was unable to reach patient via telephone today and have left HIPAA compliant voicemail asking patient to return my call.    Follow Up Plan: Will collaborate with Care Guide to outreach to schedule follow up with me  [name], PharmD, [facility] Clinical Pharmacist [facility] Care Management [phone]

## 2019-11-26 ENCOUNTER — Other Ambulatory Visit: Payer: Self-pay

## 2019-11-26 ENCOUNTER — Encounter: Payer: Self-pay | Admitting: Family Medicine

## 2019-11-26 ENCOUNTER — Telehealth: Payer: Self-pay

## 2019-11-26 ENCOUNTER — Telehealth (INDEPENDENT_AMBULATORY_CARE_PROVIDER_SITE_OTHER): Payer: Medicare HMO | Admitting: Family Medicine

## 2019-11-26 DIAGNOSIS — J029 Acute pharyngitis, unspecified: Secondary | ICD-10-CM | POA: Diagnosis not present

## 2019-11-26 MED ORDER — PREDNISONE 20 MG PO TABS: 40.0000 mg | ORAL_TABLET | Freq: Every day | ORAL | 0 refills | Status: DC

## 2019-11-26 NOTE — Chronic Care Management (AMB) (Signed)
°  Care Management   Note  11/26/2019 Name: [name]. MRN: [id number] DOB: [birth year]  [name]. is a 74 y.o. year old male who is a primary care patient of [name], DO and is actively engaged with the care management team. I reached out to [name]. by phone today to assist with re-scheduling a follow up visit with the Pharmacist  Follow up plan: Unsuccessful telephone outreach attempt made. A HIPAA compliant phone message was left for the patient providing contact information and requesting a return call.  The care management team will reach out to the patient again over the next 7 days.  If patient returns call to provider office, please advise to call Embedded Care Management Care Guide Penne Lash  at [phone]  Penne Lash, RMA Care Guide, Embedded Care Coordination [facility]  [place], [place] [address] Direct Dial: [phone] [email or web address]: [email or web address]

## 2019-11-26 NOTE — Progress Notes (Signed)
Virtual Visit via Telephone The purpose of this virtual visit is to provide medical care while limiting exposure to the novel coronavirus (COVID19) for both patient and office staff.  Consent was obtained for phone visit:  Yes.   Answered questions that patient had about telehealth interaction:  Yes.   I discussed the limitations, risks, security and privacy concerns of performing an evaluation and management service by telephone. I also discussed with the patient that there may be a patient responsible charge related to this service. The patient expressed understanding and agreed to proceed.  Patient Location: Home Provider Location: [facility])  ---------------------------------------------------------------------- Chief Complaint  Patient presents with  . Sore Throat    onset yesterday denies any other symptoms    S: Reviewed CMA documentation. I have called patient and gathered additional HPI as follows:  Sore Throat Reports that symptoms started 1 day ago with nagging sore throat with pain and some difficulty with eating at times. He had similar issue 07/2019 seen by Cyndia Skeeters, FNP he was dx with tonsilitis, given augmentin and prednisone, he said it resolved the problem. Now he does not want to let it get worse again. Asked for medications today. Using otc tylenol PRN  Denies any known or suspected exposure to person with or possibly with COVID19.  Denies any fevers, chills, sweats, body ache, cough, shortness of breath, sinus pain or pressure, headache, abdominal pain, diarrhea  Past Medical History:  Diagnosis Date  . Erectile dysfunction   . Hypertension   . Right-sided chest pain    Social History   Tobacco Use  . Smoking status: Current Some Day Smoker    Types: Cigarettes  . Smokeless tobacco: Never Used  . Tobacco comment: pack of cigarettes last 1-2 months - doesnt inhale  Vaping Use  . Vaping Use: Never used  Substance Use Topics  .  Alcohol use: Yes    Alcohol/week: 1.0 standard drink    Types: 1 Cans of beer per week    Comment: 1-2 beers week   . Drug use: No    Current Outpatient Medications:  .  ACCU-CHEK AVIVA PLUS test strip, Check sugar up to 2 x daily, Disp: 200 each, Rfl: 5 .  Accu-Chek FastClix Lancets MISC, Check sugar up to 2 x daily, Disp: 102 each, Rfl: 3 .  Accu-Chek Softclix Lancets lancets, Check sugar up to 2 x daily, Disp: 200 each, Rfl: 5 .  acetaminophen (TYLENOL) 325 MG tablet, Take 2 tablets (650 mg total) by mouth every 6 (six) hours as needed for mild pain or headache (fever >/= 101)., Disp:  , Rfl:  .  adapalene (DIFFERIN) 0.1 % gel, Apply topically at bedtime. As needed, Disp: 45 g, Rfl: 0 .  Blood Glucose Monitoring Suppl (ACCU-CHEK AVIVA PLUS) w/Device KIT, Use to check blood sugar up to x 2 daily, Disp: 1 kit, Rfl: 0 .  Dulaglutide (TRULICITY) 1.5 LD/3.5TS SOPN, Inject 1.5 mg into the skin once a week., Disp: 4 pen, Rfl: 0 .  lisinopril-hydrochlorothiazide (ZESTORETIC) 20-12.5 MG tablet, Take 1 tablet by mouth daily. (Patient taking differently: Take 0.5 tablets by mouth daily. ), Disp: 90 tablet, Rfl: 3 .  metFORMIN (GLUCOPHAGE) 1000 MG tablet, Take 1 tablet (1,000 mg total) by mouth 2 (two) times daily with a meal., Disp: 180 tablet, Rfl: 3 .  pravastatin (PRAVACHOL) 10 MG tablet, TAKE 1 TABLET BY MOUTH DAILY, Disp: 90 tablet, Rfl: 1 .  Selenium Sulfide 2.25 % SHAM, APPLY EXTERNALLY  TO THE AFFECTED AREA DAILY AS NEEDED FOR IRRITATION, Disp: 180 mL, Rfl: 0 .  sildenafil (REVATIO) 20 MG tablet, Take 2-5 pills about 30 min prior to sex., Disp: 30 tablet, Rfl: 2 .  predniSONE (DELTASONE) 20 MG tablet, Take 2 tablets (40 mg total) by mouth daily with breakfast. For 5 days, Disp: 10 tablet, Rfl: 0  Depression screen [facility] 2/9 11/26/2019 06/10/2019 12/10/2018  Decreased Interest 0 0 0  Down, Depressed, Hopeless 0 0 0  PHQ - 2 Score 0 0 0  Altered sleeping - - -  Tired, decreased energy - - -   Change in appetite - - -  Feeling bad or failure about yourself  - - -  Trouble concentrating - - -  Moving slowly or fidgety/restless - - -  Suicidal thoughts - - -  PHQ-9 Score - - -  Difficult doing work/chores - - -    No flowsheet data found.  -------------------------------------------------------------------------- O: No physical exam performed due to remote telephone encounter.  Lab results reviewed.  Recent Results (from the past 2160 hour(s))  HM DIABETES EYE EXAM     Status: None   Collection Time: 09/13/19 12:00 AM  Result Value Ref Range   HM Diabetic Eye Exam No Retinopathy No Retinopathy    -------------------------------------------------------------------------- A&P:  Problem List Items Addressed This Visit    None    Visit Diagnoses    Sore throat    -  Primary   Relevant Medications   predniSONE (DELTASONE) 20 MG tablet     Clinically sounds similar to last episode 07/2019 tonsillitis resolved w/ augmentin and prednisone however this seems to be very early and acute onset with 1-2 days approx No other infection symptoms at this time Limited history provided by patient unable to determine underlying cause, and not endorsing post nasal drainage or other causes of sore throat stomach acid or other cause  Start Prednisone 59m (23mx 2) daily with meal for 5 days, similarly ordered last time 07/2019 HOLD any antibiotic today given acute onset, and would recommend he contact [place] within 3-7 days if not improving can send augmentin in future if not improving Advised him that since unclear cause we can consider refer to ENT if not improving and recurrent episodes. Return precautions given.   Meds ordered this encounter  Medications  . predniSONE (DELTASONE) 20 MG tablet    Sig: Take 2 tablets (40 mg total) by mouth daily with breakfast. For 5 days    Dispense:  10 tablet    Refill:  0    Follow-up: - Return in 1 week as needed if not  improved  Patient verbalizes understanding with the above medical recommendations including the limitation of remote medical advice.  Specific follow-up and call-back criteria were given for patient to follow-up or seek medical care more urgently if needed.   - Time spent in direct consultation with patient on phone: 10 minutes   AlNobie PutnamDOCoam[place]oup 11/26/2019, 3:10 PM

## 2019-11-26 NOTE — Patient Instructions (Addendum)
Start Prednisone 40mg  (20mg  x 2) daily with meal for 5 days, similarly ordered last time 07/2019 HOLD any antibiotic today given acute onset, and would recommend he contact back within 3-7 days if not improving can send augmentin in future if not improving Advised him that since unclear cause we can consider refer to ENT if not improving and recurrent episodes. Return precautions given.  Please schedule a Follow-up Appointment to: Return in about 1 week (around 12/03/2019), or if symptoms worsen or fail to improve, for if not improve.  If you have any other questions or concerns, please feel free to call the office or send a message through MyChart. You may also schedule an earlier appointment if necessary.  Additionally, you may be receiving a survey about your experience at our office within a few days to 1 week by e-mail or mail. We value your feedback.  [place], DO [facility], Saralyn Pilar

## 2019-11-29 NOTE — Chronic Care Management (AMB) (Signed)
  Care Management   Note  11/29/2019 Name: [name]. MRN: [id number] DOB: 10-22-1945  [name]. is a 74 y.o. year old male who is a primary care patient of [name], DO and is actively engaged with the care management team. I reached out to [name]. by phone today to assist with re-scheduling a follow up visit with the Pharmacist  Follow up plan: Telephone appointment with care management team member scheduled for:12/20/2019  Penne Lash, RMA Care Guide, Embedded Care Coordination [facility]  [place], [place] [address] Direct Dial: [phone] [email or web address]: [email or web address]

## 2019-11-29 NOTE — Telephone Encounter (Signed)
Pt has been r/s  

## 2019-12-06 ENCOUNTER — Other Ambulatory Visit: Payer: Self-pay | Admitting: *Deleted

## 2019-12-06 DIAGNOSIS — E1169 Type 2 diabetes mellitus with other specified complication: Secondary | ICD-10-CM | POA: Diagnosis not present

## 2019-12-06 DIAGNOSIS — E785 Hyperlipidemia, unspecified: Secondary | ICD-10-CM

## 2019-12-06 DIAGNOSIS — I10 Essential (primary) hypertension: Secondary | ICD-10-CM

## 2019-12-06 DIAGNOSIS — E113293 Type 2 diabetes mellitus with mild nonproliferative diabetic retinopathy without macular edema, bilateral: Secondary | ICD-10-CM | POA: Diagnosis not present

## 2019-12-06 DIAGNOSIS — R351 Nocturia: Secondary | ICD-10-CM | POA: Diagnosis not present

## 2019-12-06 DIAGNOSIS — Z0000 Encounter for general adult medical examination without abnormal findings: Secondary | ICD-10-CM

## 2019-12-07 ENCOUNTER — Other Ambulatory Visit: Payer: Medicare HMO

## 2019-12-07 LAB — COMPLETE METABOLIC PANEL WITH GFR
AG Ratio: 1.5 (calc) (ref 1.0–2.5)
ALT: 11 U/L (ref 9–46)
AST: 15 U/L (ref 10–35)
Albumin: 4 g/dL (ref 3.6–5.1)
Alkaline phosphatase (APISO): 49 U/L (ref 35–144)
BUN: 9 mg/dL (ref 7–25)
CO2: 24 mmol/L (ref 20–32)
Calcium: 9 mg/dL (ref 8.6–10.3)
Chloride: 103 mmol/L (ref 98–110)
Creat: 1.11 mg/dL (ref 0.70–1.18)
GFR, Est African American: 76 mL/min/{1.73_m2} (ref >=60)
GFR, Est Non African American: 66 mL/min/{1.73_m2} (ref >=60)
Globulin: 2.6 g/dL (calc) (ref 1.9–3.7)
Glucose, Bld: 141 mg/dL — ABNORMAL HIGH (ref 65–99)
Potassium: 4 mmol/L (ref 3.5–5.3)
Sodium: 139 mmol/L (ref 135–146)
Total Bilirubin: 0.8 mg/dL (ref 0.2–1.2)
Total Protein: 6.6 g/dL (ref 6.1–8.1)

## 2019-12-07 LAB — CBC WITH DIFFERENTIAL/PLATELET
Absolute Monocytes: 406 cells/uL (ref 200–950)
Basophils Absolute: 21 cells/uL (ref 0–200)
Basophils Relative: 0.5 %
Eosinophils Absolute: 49 cells/uL (ref 15–500)
Eosinophils Relative: 1.2 %
HCT: 41.1 % (ref 38.5–50.0)
Hemoglobin: 14.1 g/dL (ref 13.2–17.1)
Lymphs Abs: 1796 cells/uL (ref 850–3900)
MCH: 31.3 pg (ref 27.0–33.0)
MCHC: 34.3 g/dL (ref 32.0–36.0)
MCV: 91.3 fL (ref 80.0–100.0)
MPV: 10 fL (ref 7.5–12.5)
Monocytes Relative: 9.9 %
Neutro Abs: 1829 cells/uL (ref 1500–7800)
Neutrophils Relative %: 44.6 %
Platelets: 170 10*3/uL (ref 140–400)
RBC: 4.5 10*6/uL (ref 4.20–5.80)
RDW: 12.1 % (ref 11.0–15.0)
Total Lymphocyte: 43.8 %
WBC: 4.1 10*3/uL (ref 3.8–10.8)

## 2019-12-07 LAB — LIPID PANEL
Cholesterol: 198 mg/dL (ref <200)
HDL: 94 mg/dL (ref >=40)
LDL Cholesterol (Calc): 87 mg/dL (calc)
Non-HDL Cholesterol (Calc): 104 mg/dL (calc) (ref <130)
Total CHOL/HDL Ratio: 2.1 (calc) (ref <5.0)
Triglycerides: 76 mg/dL (ref <150)

## 2019-12-07 LAB — HEMOGLOBIN A1C
Hgb A1c MFr Bld: 6.6 % of total Hgb — ABNORMAL HIGH (ref <5.7)
Mean Plasma Glucose: 143 (calc)
eAG (mmol/L): 7.9 (calc)

## 2019-12-07 LAB — TSH: TSH: 1.93 mIU/L (ref 0.40–4.50)

## 2019-12-07 LAB — PSA: PSA: 0.23 ng/mL (ref <=4.0)

## 2019-12-08 ENCOUNTER — Other Ambulatory Visit: Payer: Medicare HMO

## 2019-12-13 ENCOUNTER — Other Ambulatory Visit: Payer: Medicare HMO

## 2019-12-14 ENCOUNTER — Encounter: Payer: Self-pay | Admitting: Family Medicine

## 2019-12-14 ENCOUNTER — Ambulatory Visit (INDEPENDENT_AMBULATORY_CARE_PROVIDER_SITE_OTHER): Payer: Medicare HMO | Admitting: Family Medicine

## 2019-12-14 ENCOUNTER — Encounter: Payer: Medicare HMO | Admitting: Family Medicine

## 2019-12-14 ENCOUNTER — Other Ambulatory Visit: Payer: Self-pay

## 2019-12-14 VITALS — BP 124/70 | HR 63 | Temp 96.9°F | Resp 16 | Ht 71.0 in | Wt 194.0 lb

## 2019-12-14 DIAGNOSIS — E785 Hyperlipidemia, unspecified: Secondary | ICD-10-CM

## 2019-12-14 DIAGNOSIS — Z0000 Encounter for general adult medical examination without abnormal findings: Secondary | ICD-10-CM

## 2019-12-14 DIAGNOSIS — I10 Essential (primary) hypertension: Secondary | ICD-10-CM | POA: Diagnosis not present

## 2019-12-14 DIAGNOSIS — E113293 Type 2 diabetes mellitus with mild nonproliferative diabetic retinopathy without macular edema, bilateral: Secondary | ICD-10-CM | POA: Diagnosis not present

## 2019-12-14 DIAGNOSIS — N529 Male erectile dysfunction, unspecified: Secondary | ICD-10-CM | POA: Diagnosis not present

## 2019-12-14 DIAGNOSIS — E1169 Type 2 diabetes mellitus with other specified complication: Secondary | ICD-10-CM

## 2019-12-14 MED ORDER — SILDENAFIL CITRATE 20 MG PO TABS: ORAL_TABLET | ORAL | 5 refills | Status: DC

## 2019-12-14 MED ORDER — PRAVASTATIN SODIUM 10 MG PO TABS: 10.0000 mg | ORAL_TABLET | Freq: Every day | ORAL | 3 refills | Status: DC

## 2019-12-14 MED ORDER — LISINOPRIL-HYDROCHLOROTHIAZIDE 20-12.5 MG PO TABS: 0.5000 | ORAL_TABLET | Freq: Every day | ORAL | 3 refills | Status: DC

## 2019-12-14 NOTE — Assessment & Plan Note (Signed)
Controlled cholesterol on statin and lifestyle Last lipid panel 11/2019 The 10-year ASCVD risk score Denman George DC Jr., et al., 2013) is: 44.9%  Plan: 1. Continue current meds - Pravastatin 10mg  daily 2. Encourage improved lifestyle - low carb/cholesterol, reduce portion size, continue improving regular exercise

## 2019-12-14 NOTE — Progress Notes (Signed)
Subjective:    Patient ID: Christopher Friendly., male    DOB: 1945/03/25, 74 y.o.   MRN: [id number]  Christopher Kronberg. is a 74 y.o. male presenting on 12/14/2019 for Annual Exam   HPI   Here for Annual Physical and Lab Review.  CHRONIC DM, Type 2w/ Retinopathy - Last visit withme 05/2019, has been doing well on GLP1 regularly now with controlledA1c See prior notes for background info - Today patient reportshe is doing very well overall. No new major concerns. No further significant hypoglycemia readings.  Last K5L 6.6 - Trulicity 9.3TT weekly injection - Metformin 1082m BID - OFF GLipizide Reports good compliance. Tolerating well w/o side-effects Currently on ACEi Lifestyle: Weight down 6-7 lbs, had prior wt loss. - Diet (improve diet) - Exercise (Limited exercise due to time- gradually improving) - Dr WEllin Mayhewlast DM Eye exam 08/2019 Denieshypoglycemia,polyuria, visual changes, numbness or tingling.  CHRONIC HTN: Reportsno new concerns.Controlled without problem. Not checking BP regularly Current Meds -Lisinopril-HCTZ 20-12.516m- HALF pill daily - he reduced this due to concern with sexual performance  Reports good compliance, took meds today. Tolerating well, w/o complaints. Admits can have episodic headache flare with BP elevated. Denies CP, dyspnea, HA, edema, dizziness / lightheadedness  HYPERLIPIDEMIA: - Reports no concerns. Last lipid panel 11/2019, controlled  - Currently taking Pravastatin 2032mtolerating well without side effects or myalgias   Erectile Dysfunction Takes occasional dose generic Sildenafil x 2 = 9m51mN Has improved since he has taken half BP pill  Additional concern  Skin Tag / Cyst on back Reports 2 areas on back that bother him time to time, can be sore or swell, now no problem, asks about these.  Health Maintenance: UTD COVIKanet dose 05/14/19, declines booster at this time UTD Flu Shot  PSA 0.23,  negative.  Last cologuard 08/18/17 negative. Repeat due 07/2020   Depression screen PHQ [facility] 12/14/2019 11/26/2019 06/10/2019  Decreased Interest 0 0 0  Down, Depressed, Hopeless 0 0 0  PHQ - 2 Score 0 0 0  Altered sleeping - - -  Tired, decreased energy - - -  Change in appetite - - -  Feeling bad or failure about yourself  - - -  Trouble concentrating - - -  Moving slowly or fidgety/restless - - -  Suicidal thoughts - - -  PHQ-9 Score - - -  Difficult doing work/chores - - -    Past Medical History:  Diagnosis Date  . Erectile dysfunction   . Hypertension   . Right-sided chest pain    Past Surgical History:  Procedure Laterality Date  . ROTATOR CUFF REPAIR     Social History   Socioeconomic History  . Marital status: Divorced    Spouse name: Not on file  . Number of children: Not on file  . Years of education: Not on file  . Highest education level: Not on file  Occupational History  . Not on file  Tobacco Use  . Smoking status: Current Some Day Smoker    Types: Cigarettes  . Smokeless tobacco: Current User  . Tobacco comment: pack of cigarettes last 1-2 months - doesnt inhale  Vaping Use  . Vaping Use: Never used  Substance and Sexual Activity  . Alcohol use: Yes    Alcohol/week: 1.0 standard drink    Types: 1 Cans of beer per week    Comment: 1-2 beers week   . Drug use: No  . Sexual activity: Not  on file  Other Topics Concern  . Not on file  Social History Narrative   Working 1-2 days a week .    Social Determinants of Health   Financial Resource Strain:   . Difficulty of Paying Living Expenses: Not on file  Food Insecurity:   . Worried About Charity fundraiser in the Last Year: Not on file  . Ran Out of Food in the Last Year: Not on file  Transportation Needs:   . Lack of Transportation (Medical): Not on file  . Lack of Transportation (Non-Medical): Not on file  Physical Activity:   . Days of Exercise per Week: Not on file  . Minutes of  Exercise per Session: Not on file  Stress:   . Feeling of Stress : Not on file  Social Connections:   . Frequency of Communication with Friends and Family: Not on file  . Frequency of Social Gatherings with Friends and Family: Not on file  . Attends Religious Services: Not on file  . Active Member of Clubs or Organizations: Not on file  . Attends Archivist Meetings: Not on file  . Marital Status: Not on file  Intimate Partner Violence:   . Fear of Current or Ex-Partner: Not on file  . Emotionally Abused: Not on file  . Physically Abused: Not on file  . Sexually Abused: Not on file   Family History  Problem Relation Age of Onset  . Cancer Brother        Throat   Current Outpatient Medications on File Prior to Visit  Medication Sig  . ACCU-CHEK AVIVA PLUS test strip Check sugar up to 2 x daily  . Accu-Chek FastClix Lancets MISC Check sugar up to 2 x daily  . Accu-Chek Softclix Lancets lancets Check sugar up to 2 x daily  . acetaminophen (TYLENOL) 325 MG tablet Take 2 tablets (650 mg total) by mouth every 6 (six) hours as needed for mild pain or headache (fever >/= 101).  [name] adapalene (DIFFERIN) 0.1 % gel Apply topically at bedtime. As needed  . Blood Glucose Monitoring Suppl (ACCU-CHEK AVIVA PLUS) w/Device KIT Use to check blood sugar up to x 2 daily  . Dulaglutide (TRULICITY) 1.5 PN/3.0YF SOPN Inject 1.5 mg into the skin once a week.  . metFORMIN (GLUCOPHAGE) 1000 MG tablet Take 1 tablet (1,000 mg total) by mouth 2 (two) times daily with a meal.  . Selenium Sulfide 2.25 % SHAM APPLY EXTERNALLY TO THE AFFECTED AREA DAILY AS NEEDED FOR IRRITATION   No current facility-administered medications on file prior to visit.    Review of Systems  Constitutional: Negative for activity change, appetite change, chills, diaphoresis, fatigue and fever.  HENT: Negative for congestion and hearing loss.   Eyes: Negative for visual disturbance.  Respiratory: Negative for apnea, cough,  chest tightness, shortness of breath and wheezing.   Cardiovascular: Negative for chest pain, palpitations and leg swelling.  Gastrointestinal: Negative for abdominal pain, anal bleeding, blood in stool, constipation, diarrhea, nausea and vomiting.  Endocrine: Negative for cold intolerance.  Genitourinary: Negative for difficulty urinating, dysuria, frequency and hematuria.  Musculoskeletal: Negative for arthralgias, back pain and neck pain.  Skin: Negative for rash.  Allergic/Immunologic: Negative for environmental allergies.  Neurological: Negative for dizziness, weakness, light-headedness, numbness and headaches.  Hematological: Negative for adenopathy.  Psychiatric/Behavioral: Negative for behavioral problems, dysphoric mood and sleep disturbance. The patient is not nervous/anxious.    Per HPI unless specifically indicated above  Objective:    BP 124/70 (BP Location: Left Arm, Cuff Size: Normal)   Pulse 63   Temp (!) 96.9 F (36.1 C) (Temporal)   Resp 16   Ht 5' 11"  (1.803 m)   Wt 194 lb (88 kg)   SpO2 99%   BMI 27.06 kg/m   Wt Readings from Last 3 Encounters:  12/14/19 194 lb (88 kg)  06/10/19 187 lb (84.8 kg)  12/10/18 184 lb (83.5 kg)    Physical Exam Vitals and nursing note reviewed.  Constitutional:      General: He is not in acute distress.    Appearance: He is well-developed. He is not diaphoretic.     Comments: Well-appearing, comfortable, cooperative  HENT:     Head: Normocephalic and atraumatic.  Eyes:     General:        Right eye: No discharge.        Left eye: No discharge.     Conjunctiva/sclera: Conjunctivae normal.     Pupils: Pupils are equal, round, and reactive to light.  Neck:     Thyroid: No thyromegaly.  Cardiovascular:     Rate and Rhythm: Normal rate and regular rhythm.     Heart sounds: Normal heart sounds. No murmur heard.   Pulmonary:     Effort: Pulmonary effort is normal. No respiratory distress.     Breath sounds: Normal  breath sounds. No wheezing or rales.  Abdominal:     General: Bowel sounds are normal. There is no distension.     Palpations: Abdomen is soft. There is no mass.     Tenderness: There is no abdominal tenderness.  Musculoskeletal:        General: No tenderness. Normal range of motion.     Cervical back: Normal range of motion and neck supple.     Comments: Upper / Lower Extremities: - Normal muscle tone, strength bilateral upper extremities 5/5, lower extremities 5/5  Lymphadenopathy:     Cervical: No cervical adenopathy.  Skin:    General: Skin is warm and dry.     Findings: Lesion (upper midline thoracic spine with 1 x 1 cm soft raised larger skin tag engorged non tender, and lower mid back  over T spine with 2 x 2 cm epidermal inclusion cyst non inflamed) present. No erythema or rash.  Neurological:     Mental Status: He is alert and oriented to person, place, and time.     Comments: Distal sensation intact to light touch all extremities  Psychiatric:        Behavior: Behavior normal.     Comments: Well groomed, good eye contact, normal speech and thoughts      Diabetic Foot Exam - Simple   Simple Foot Form Diabetic Foot exam was performed with the following findings: Yes 12/14/2019  2:17 PM  Visual Inspection See comments: Yes Sensation Testing Intact to touch and monofilament testing bilaterally: Yes Pulse Check Posterior Tibialis and Dorsalis pulse intact bilaterally: Yes Comments Dry skin with mild callus formation heels, has overgrown thick great toenails. No ulceration.    Recent Labs    06/10/19 0810 12/06/19 0801  HGBA1C 6.0* 6.6*     Results for orders placed or performed in visit on 12/06/19  TSH  Result Value Ref Range   TSH 1.93 0.40 - 4.50 mIU/L  PSA  Result Value Ref Range   PSA 0.23 < OR = 4.0 ng/mL  Lipid panel  Result Value Ref Range   Cholesterol 198 <200  mg/dL   HDL 94 > OR = 40 mg/dL   Triglycerides 76 <150 mg/dL   LDL Cholesterol (Calc)  87 mg/dL (calc)   Total CHOL/HDL Ratio 2.1 <5.0 (calc)   Non-HDL Cholesterol (Calc) 104 <130 mg/dL (calc)  Hemoglobin A1c  Result Value Ref Range   Hgb A1c MFr Bld 6.6 (H) <5.7 % of total Hgb   Mean Plasma Glucose 143 (calc)   eAG (mmol/L) 7.9 (calc)  CBC with Differential/Platelet  Result Value Ref Range   WBC 4.1 3.8 - 10.8 Thousand/uL   RBC 4.50 4.20 - 5.80 Million/uL   Hemoglobin 14.1 13.2 - 17.1 g/dL   HCT 41.1 38 - 50 %   MCV 91.3 80.0 - 100.0 fL   MCH 31.3 27.0 - 33.0 pg   MCHC 34.3 32.0 - 36.0 g/dL   RDW 12.1 11.0 - 15.0 %   Platelets 170 140 - 400 Thousand/uL   MPV 10.0 7.5 - 12.5 fL   Neutro Abs 1,829 1,500 - 7,800 cells/uL   Lymphs Abs 1,796 850 - 3,900 cells/uL   Absolute Monocytes 406 200 - 950 cells/uL   Eosinophils Absolute 49 15.0 - 500.0 cells/uL   Basophils Absolute 21 0.0 - 200.0 cells/uL   Neutrophils Relative % 44.6 %   Total Lymphocyte 43.8 %   Monocytes Relative 9.9 %   Eosinophils Relative 1.2 %   Basophils Relative 0.5 %  COMPLETE METABOLIC PANEL WITH GFR  Result Value Ref Range   Glucose, Bld 141 (H) 65 - 99 mg/dL   BUN 9 7 - 25 mg/dL   Creat 1.11 0.70 - 1.18 mg/dL   GFR, Est Non African American 66 > OR = 60 mL/min/1.97m   GFR, Est African American 76 > OR = 60 mL/min/1.718m  BUN/Creatinine Ratio NOT APPLICABLE 6 - 22 (calc)   Sodium 139 135 - 146 mmol/L   Potassium 4.0 3.5 - 5.3 mmol/L   Chloride 103 98 - 110 mmol/L   CO2 24 20 - 32 mmol/L   Calcium 9.0 8.6 - 10.3 mg/dL   Total Protein 6.6 6.1 - 8.1 g/dL   Albumin 4.0 3.6 - 5.1 g/dL   Globulin 2.6 1.9 - 3.7 g/dL (calc)   AG Ratio 1.5 1.0 - 2.5 (calc)   Total Bilirubin 0.8 0.2 - 1.2 mg/dL   Alkaline phosphatase (APISO) 49 35 - 144 U/L   AST 15 10 - 35 U/L   ALT 11 9 - 46 U/L      Assessment & Plan:   Problem List Items Addressed This Visit    Hyperlipidemia associated with type 2 diabetes mellitus (HCCreedmoor   Controlled cholesterol on statin and lifestyle Last lipid panel  11/2019 The 10-year ASCVD risk score (GMikey BussingC Jr., et al., 2013) is: 44.9%  Plan: 1. Continue current meds - Pravastatin 1060maily 2. Encourage improved lifestyle - low carb/cholesterol, reduce portion size, continue improving regular exercise      Relevant Medications   pravastatin (PRAVACHOL) 10 MG tablet   lisinopril-hydrochlorothiazide (ZESTORETIC) 20-12.5 MG tablet   Essential hypertension    Well-controlled HTN - repeat manual improved in office On reduced BP med now Not checking regularly  No known complications     Plan:  1. HALF pill Lisinopril HCTZ 20-12.5mg34mily, new rx sent for new instructions 0.5 tab, #45 pills - Goal to limit meds improve ED - this has already improved on lower dose 2. Encourage improved lifestyle - low sodium diet, regular exercise 3. Continue monitor  BP outside office, bring readings to next visit, if persistently >140/90 or new symptoms notify office sooner      Relevant Medications   pravastatin (PRAVACHOL) 10 MG tablet   lisinopril-hydrochlorothiazide (ZESTORETIC) 20-12.5 MG tablet   sildenafil (REVATIO) 20 MG tablet   ED (erectile dysfunction) of organic origin    Likely multifactorial Worse with HTN DM and on meds  Improved on reduced BP medication now, half tab Refill Sildenafil taking 2 pills = 72m PRN with good results      Relevant Medications   sildenafil (REVATIO) 20 MG tablet   Diabetic retinopathy (HCornlea    Resolved DM Retinopathy on last exam      Relevant Medications   pravastatin (PRAVACHOL) 10 MG tablet   lisinopril-hydrochlorothiazide (ZESTORETIC) 20-12.5 MG tablet   Controlled type 2 diabetes mellitus with retinopathy (HConstantine    Well-controlled DM with A1c at 6.6, on lifestyle and GLP1 No hypoglycemia Complications - DM retinopathy hyperlipidemia- increases risk of future cardiovascular complications  - OFF Glipizide  Plan:  1. Continue current therapy - Trulicity 13.5WSweekly, Metformin 10064mBID 2. Encourage  improved lifestyle - low carb, low sugar diet, reduce portion size, continue improving regular exercise 3. Check CBG , bring log to next visit for review 4. Continue ACEi, Statin 5. 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 pravastatin (PRAVACHOL) 10 MG tablet    Sig: Take 1 tablet (10 mg total) by mouth daily.    Dispense:  90 tablet    Refill:  3  . lisinopril-hydrochlorothiazide (ZESTORETIC) 20-12.5 MG tablet    Sig: Take 0.5 tablets by mouth daily.    Dispense:  45 tablet    Refill:  3    Add refills with new instructions.  . sildenafil (REVATIO) 20 MG tablet    Sig: Take 2-5 pills about 30 min prior to sex.    Dispense:  30 tablet    Refill:  5      Follow up plan: Return in about 6 months (around 06/12/2020) for 3 month POC A1c check only with nurse - then next Follow-up 6 month (05/2020) DM A1c, HTN.  AlNobie PutnamDOGroveredical Group 12/14/2019, 2:23 PM

## 2019-12-14 NOTE — Assessment & Plan Note (Signed)
Well-controlled DM with A1c at 6.6, on lifestyle and GLP1 No hypoglycemia Complications - DM retinopathy hyperlipidemia- increases risk of future cardiovascular complications  - OFF Glipizide  Plan:  1. Continue current therapy - Trulicity 1.5mg  weekly, Metformin 1000mg  BID 2. Encourage improved lifestyle - low carb, low sugar diet, reduce portion size, continue improving regular exercise 3. Check CBG , bring log to next visit for review 4. Continue ACEi, Statin 5. 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

## 2019-12-14 NOTE — Assessment & Plan Note (Signed)
Likely multifactorial Worse with HTN DM and on meds  Improved on reduced BP medication now, half tab Refill Sildenafil taking 2 pills = 40mg PRN with good results 

## 2019-12-14 NOTE — Assessment & Plan Note (Signed)
Resolved DM Retinopathy on last exam

## 2019-12-14 NOTE — Assessment & Plan Note (Signed)
Well-controlled HTN - repeat manual improved in office On reduced BP med now Not checking regularly  No known complications     Plan:  1. HALF pill Lisinopril HCTZ 20-12.5mg  daily, new rx sent for new instructions 0.5 tab, #45 pills - Goal to limit meds improve ED - this has already improved on lower dose 2. Encourage improved lifestyle - low sodium diet, regular exercise 3. Continue monitor BP outside office, bring readings to next visit, if persistently >140/90 or new symptoms notify office sooner

## 2019-12-14 NOTE — Patient Instructions (Addendum)
Thank you for coming to the office today.  For the skin tag (top one) and deeper larger cyst (lower one) - they can be removed by surgeon if bothering you, please call this location if you want to ask them about cost and coverage with insurance or to get more information.  If you want me to go ahead and refer - let me know.  [facility] Address: [address], [place], [place] [address] Phone: [phone]  ------------------------------------------  Refilled medicines  Keep on half BP pill it is working well.  Please schedule a Follow-up Appointment to: Return in about 6 months (around 06/12/2020) for 3 month POC A1c check only with nurse - then next Follow-up 6 month (05/2020) DM A1c, HTN.  If you have any other questions or concerns, please feel free to call the office or send a message through MyChart. You may also schedule an earlier appointment if necessary.  Additionally, you may be receiving a survey about your experience at our office within a few days to 1 week by e-mail or mail. We value your feedback.  Saralyn Pilar, DO [facility], [place]

## 2019-12-20 ENCOUNTER — Other Ambulatory Visit: Payer: Self-pay | Admitting: Family Medicine

## 2019-12-20 ENCOUNTER — Ambulatory Visit: Payer: Medicare HMO | Admitting: Pharmacist

## 2019-12-20 DIAGNOSIS — E785 Hyperlipidemia, unspecified: Secondary | ICD-10-CM

## 2019-12-20 DIAGNOSIS — I10 Essential (primary) hypertension: Secondary | ICD-10-CM

## 2019-12-20 DIAGNOSIS — E113293 Type 2 diabetes mellitus with mild nonproliferative diabetic retinopathy without macular edema, bilateral: Secondary | ICD-10-CM

## 2019-12-20 MED ORDER — PRAVASTATIN SODIUM 40 MG PO TABS: 40.0000 mg | ORAL_TABLET | Freq: Every day | ORAL | 3 refills | Status: DC

## 2019-12-20 NOTE — Patient Instructions (Signed)
Thank you allowing the Chronic Care Management Team to be a part of your care! It was a pleasure speaking with you today!     CCM (Chronic Care Management) Team    Alto Denver RN, MSN, CCM Nurse Care Coordinator  [phone]   [name] PharmD  Clinical Pharmacist  [phone]   Dickie La LCSW Clinical Social Worker [phone]  Visit Information  Goals Addressed              This Visit's Progress     PharmD - "I want to get my A1C down" (pt-stated)        CARE PLAN ENTRY (see longtitudinal plan of care for additional care plan information)  Current Barriers:     Financial Barriers  Knowledge deficits related to nutrition with diabetes  Pharmacist Clinical Goal(s):   Over the next 30 days, patient will continue to work with PharmD to address needs related to address needs related to diabetes management.   Over the next 30 days, patient will demonstrate maintained medication adherence as evidenced by patient report of adherence  Interventions:    Follow up with Mr. Desa regarding medication assistance re-enrollment for 2022 calendar year.  ? Patient reports that he received re-enrollment application directly from [place], completed form and returned to office a couple of weeks ago  Counsel on importance of blood sugar control and monitoring ? Patient reports taking: ? Metformin 1000 mg twice daily ? Trulicity 1.5 mg weekly on Mondays ? Recalls recent morning fasting CBGs ranging from 116-140  Counsel on importance of home BP monitoring ? Reports taking lisinopril/HCTZ 20-12.5 mg - 1/2 tablet daily as directed ? Unable to review home readings today, but reports BP has been good ? Note BP from latest office visit with PCP 124/70, HR 63 on 11/23 ? Discuss impact of salt/sodium on blood pressure. Encourage patient to review nutrition labels for sodium content ? Encourage patient to continue to monitor BP outside office, bring log to medical  appointments and if persistently >140/90 or new symptoms notify office sooner  Discuss importance of lipid control for ASCVD risk reduction ? Reports taking pravastatin 10 mg daily ? Will collaborate with PCP to recommend increase of statin for ASCVD primary prevention  Counsel on importance of medication adherence ? Confirms continuing to use weekly pillbox adherence tool.    Will collaborate with [facility] CPh[facility] Pattricia Boss for assistance to patient with re-enrollment in [name] patient assistance program for 2022 calendar year ? Have collaborated with Rachell in office to request that patient assistance application brought in by patient be faxed to [facility]  Patient Self Care Activities:   Self administers all medications as prescribed o Using weekly pillbox as adherence tool  To check blood sugars and keep log of results  To contact pharmacy to order refills of medication  Calls provider office for new concerns or questions.  Patient to eat regular meals  Please see past updates related to this goal by clicking on the "Past Updates" button in the selected goal         The patient verbalized understanding of instructions, educational materials, and care plan provided today and declined offer to receive copy of patient instructions, educational materials, and care plan.   The care management team will reach out to the patient again over the next 7 days.   [name], PharmD, [facility] Clinical Pharmacist [facility] Medical [employer] [phone]

## 2019-12-20 NOTE — Chronic Care Management (AMB) (Signed)
Chronic Care Management   Follow Up Note   12/20/2019 Name: [name]. MRN: [id number] DOB: 07/16/1945  Referred by: [name], Alexander J, DO Reason for referral : Chronic Care Management (Patient Phone Call)   [name]. is a 74 y.o. year old male who is a primary care patient of [name], Alexander J, DO. The CCM team was consulted for assistance with chronic disease management and care coordination needs.    I reached out to [name]. by phone today.   Review of patient status, including review of consultants reports, relevant laboratory and other test results, and collaboration with appropriate care team members and the patient's provider was performed as part of comprehensive patient evaluation and provision of chronic care management services.    SDOH (Social Determinants of Health) assessments performed: No See Care Plan activities for detailed interventions related to SDOH)   Objective  Lab Results  Component Value Date   HGBA1C 6.6 (H) 12/06/2019   Lab Results  Component Value Date   CREATININE 1.11 12/06/2019   BUN 9 12/06/2019   NA 139 12/06/2019   K 4.0 12/06/2019   CL 103 12/06/2019   CO2 24 12/06/2019   Lab Results  Component Value Date   CHOL 198 12/06/2019   HDL 94 12/06/2019   LDLCALC 87 12/06/2019   TRIG 76 12/06/2019   CHOLHDL 2.1 12/06/2019    Clinical ASCVD: No  The 10-year ASCVD risk score (Goff DC Mclaughlin., et al., 2013) is: 44.9%   Values used to calculate the score:     Age: 74 years     Sex: Male     Is Non-Hispanic African American: Yes     Diabetic: Yes     Tobacco smoker: Yes     Systolic Blood Pressure: 124 mmHg     Is BP treated: Yes     HDL Cholesterol: 94 mg/dL     Total Cholesterol: 198 mg/dL      Outpatient Encounter Medications as of 12/20/2019  Medication Sig Note  . Dulaglutide (TRULICITY) 1.5 MG/0.5ML SOPN Inject 1.5 mg into the skin once a week. 11/19/2018: On Mondays  .  lisinopril-hydrochlorothiazide (ZESTORETIC) 20-12.5 MG tablet Take 0.5 tablets by mouth daily.   . metFORMIN (GLUCOPHAGE) 1000 MG tablet Take 1 tablet (1,000 mg total) by mouth 2 (two) times daily with a meal.   . pravastatin (PRAVACHOL) 10 MG tablet Take 1 tablet (10 mg total) by mouth daily.   . ACCU-CHEK AVIVA PLUS test strip Check sugar up to 2 x daily   . Accu-Chek FastClix Lancets MISC Check sugar up to 2 x daily   . Accu-Chek Softclix Lancets lancets Check sugar up to 2 x daily   . acetaminophen (TYLENOL) 325 MG tablet Take 2 tablets (650 mg total) by mouth every 6 (six) hours as needed for mild pain or headache (fever >/= 101).   . adapalene (DIFFERIN) 0.1 % gel Apply topically at bedtime. As needed   . Blood Glucose Monitoring Suppl (ACCU-CHEK AVIVA PLUS) w/Device KIT Use to check blood sugar up to x 2 daily   . Selenium Sulfide 2.25 % SHAM APPLY EXTERNALLY TO THE AFFECTED AREA DAILY AS NEEDED FOR IRRITATION   . sildenafil (REVATIO) 20 MG tablet Take 2-5 pills about 30 min prior to sex.    No facility-administered encounter medications on file as of 12/20/2019.    Goals Addressed              This Visit's Progress   .    PharmD - "I want to get my A1C down" (pt-stated)        CARE PLAN ENTRY (see longtitudinal plan of care for additional care plan information)  Current Barriers:    . Financial Barriers . Knowledge deficits related to nutrition with diabetes  Pharmacist Clinical Goal(s):  . Over the next 30 days, patient will continue to work with PharmD to address needs related to address needs related to diabetes management.  . Over the next 30 days, patient will demonstrate maintained medication adherence as evidenced by patient report of adherence  Interventions:   . Follow up with Mr. Holzheimer regarding medication assistance re-enrollment for 2022 calendar year.  ? Patient reports that he received re-enrollment application directly from [name], completed form and returned  to office a couple of weeks ago . Counsel on importance of blood sugar control and monitoring ? Patient reports taking: ? Metformin 1000 mg twice daily ? Trulicity 1.5 mg weekly on Mondays ? Recalls recent morning fasting CBGs ranging from 116-140 . Counsel on importance of home BP monitoring ? Reports taking lisinopril/HCTZ 20-12.5 mg - 1/2 tablet daily as directed ? Unable to review home readings today, but reports BP has been good ? Note BP from latest office visit with PCP 124/70, HR 63 on 11/23 ? Discuss impact of salt/sodium on blood pressure. Encourage patient to review nutrition labels for sodium content ? Encourage patient to continue to monitor BP outside office, bring log to medical appointments and if persistently >140/90 or new symptoms notify office sooner . Discuss importance of lipid control for ASCVD risk reduction ? Reports taking pravastatin 10 mg daily ? Will collaborate with PCP to recommend increase of statin for ASCVD primary prevention . Counsel on importance of medication adherence ? Confirms continuing to use weekly pillbox adherence tool.   . Will collaborate with [facility] C[facility] Jill Simcox for assistance to patient with re-enrollment in [name] patient assistance program for 2022 calendar year ? Have collaborated with Rachell in office to request that patient assistance application brought in by patient be faxed to [facility]  Patient Self Care Activities:  . Self administers all medications as prescribed o Using weekly pillbox as adherence tool . To check blood sugars and keep log of results . To contact pharmacy to order refills of medication . Calls provider office for new concerns or questions. . Patient to eat regular meals  Please see past updates related to this goal by clicking on the "Past Updates" button in the selected goal         Plan  The care management team will reach out to the patient again over the next 7 days.    [name] Dhalla, PharmD,  BCACP Clinical Pharmacist [facility] [phone] 

## 2019-12-22 ENCOUNTER — Ambulatory Visit: Payer: Self-pay | Admitting: Pharmacist

## 2019-12-22 DIAGNOSIS — E785 Hyperlipidemia, unspecified: Secondary | ICD-10-CM

## 2019-12-22 DIAGNOSIS — E113293 Type 2 diabetes mellitus with mild nonproliferative diabetic retinopathy without macular edema, bilateral: Secondary | ICD-10-CM

## 2019-12-22 DIAGNOSIS — E1169 Type 2 diabetes mellitus with other specified complication: Secondary | ICD-10-CM

## 2019-12-22 NOTE — Chronic Care Management (AMB) (Signed)
Chronic Care Management   Follow Up Note   12/22/2019 Name: [name]. MRN: [id number] DOB: 1945/03/12  Referred by: [name], DO Reason for referral : Chronic Care Management (Patient Phone Call)   [name]. is a 74 y.o. year old male who is a primary care patient of [name], DO. The CCM team was consulted for assistance with chronic disease management and care coordination needs.    I reached out to [name]. by phone today.   Review of patient status, including review of consultants reports, relevant laboratory and other test results, and collaboration with appropriate care team members and the patient's provider was performed as part of comprehensive patient evaluation and provision of chronic care management services.    SDOH (Social Determinants of Health) assessments performed: No See Care Plan activities for detailed interventions related to [facility])     Outpatient Encounter Medications as of 12/22/2019  Medication Sig Note  . ACCU-CHEK AVIVA PLUS test strip Check sugar up to 2 x daily   . Accu-Chek FastClix Lancets MISC Check sugar up to 2 x daily   . Accu-Chek Softclix Lancets lancets Check sugar up to 2 x daily   . acetaminophen (TYLENOL) 325 MG tablet Take 2 tablets (650 mg total) by mouth every 6 (six) hours as needed for mild pain or headache (fever >/= 101).   [name] adapalene (DIFFERIN) 0.1 % gel Apply topically at bedtime. As needed   . Blood Glucose Monitoring Suppl (ACCU-CHEK AVIVA PLUS) w/Device KIT Use to check blood sugar up to x 2 daily   . Dulaglutide (TRULICITY) 1.5 EL/3.8BO SOPN Inject 1.5 mg into the skin once a week. 11/19/2018: On Mondays  . lisinopril-hydrochlorothiazide (ZESTORETIC) 20-12.5 MG tablet Take 0.5 tablets by mouth daily.   . metFORMIN (GLUCOPHAGE) 1000 MG tablet Take 1 tablet (1,000 mg total) by mouth 2 (two) times daily with a meal.   . pravastatin (PRAVACHOL) 40 MG tablet Take 1 tablet (40 mg  total) by mouth daily.   . Selenium Sulfide 2.25 % SHAM APPLY EXTERNALLY TO THE AFFECTED AREA DAILY AS NEEDED FOR IRRITATION   . sildenafil (REVATIO) 20 MG tablet Take 2-5 pills about 30 min prior to sex.    No facility-administered encounter medications on file as of 12/22/2019.    Goals Addressed              This Visit's Progress   .  PharmD - "I want to get my A1C down" (pt-stated)        CARE PLAN ENTRY (see longtitudinal plan of care for additional care plan information)  Current Barriers:    . Financial Barriers . Knowledge deficits related to nutrition with diabetes  Pharmacist Clinical Goal(s):  [name] Over the next 30 days, patient will continue to work with PharmD to address needs related to address needs related to diabetes management.  . Over the next 30 days, patient will demonstrate maintained medication adherence as evidenced by patient report of adherence  Interventions:   . Collaborated with PCP to recommend increase of statin intensity for ASCVD primary prevention ? Provider sent Rx for pravastatin 40 mg daily to pharmacy on 11/29 . Follow up with [name] today regarding increase of pravastatin dose for greater LDL reduction/ASCVD risk reduction.  ? Patient verbalizes understanding. . Follow up with [name] regarding medication assistance re-enrollment for 2022 calendar year. ? Note received coordination of care message from [name] letting me know that  she receieved Trulicity medication assistance re-enrollment application back from patient, but that income box was left blank ? Patient states income same as last year and household size =1. ? Collaborate with [name] today to provide this update . Follow up with patient regarding COVID-19 prevention/vaccination ? Patient reports is planning to received COVID-19 booster dose on 12/6  Patient Self Care Activities:  . Self administers all medications as prescribed o Using weekly pillbox as adherence  tool . To check blood sugars and keep log of results . To contact pharmacy to order refills of medication . Calls provider office for new concerns or questions. . Patient to eat regular meals  Please see past updates related to this goal by clicking on the "Past Updates" button in the selected goal         Plan  Telephone follow up appointment with care management team member scheduled for: 02/07/2020 at 2:30 pm  Harlow Asa, PharmD, [place] [phone]

## 2019-12-22 NOTE — Patient Instructions (Signed)
Thank you allowing the Chronic Care Management Team to be a part of your care! It was a pleasure speaking with you today!     CCM (Chronic Care Management) Team    Alto Denver RN, MSN, CCM Nurse Care Coordinator  [phone]   [name] PharmD  Clinical Pharmacist  [phone]   Dickie La LCSW Clinical Social Worker [phone]  Visit Information  Goals Addressed              This Visit's Progress     PharmD - "I want to get my A1C down" (pt-stated)        CARE PLAN ENTRY (see longtitudinal plan of care for additional care plan information)  Current Barriers:     Financial Barriers  Knowledge deficits related to nutrition with diabetes  Pharmacist Clinical Goal(s):   Over the next 30 days, patient will continue to work with PharmD to address needs related to address needs related to diabetes management.   Over the next 30 days, patient will demonstrate maintained medication adherence as evidenced by patient report of adherence  Interventions:    Collaborated with PCP to recommend increase of statin intensity for ASCVD primary prevention ? Provider sent Rx for pravastatin 40 mg daily to pharmacy on 11/29  Follow up with [name] today regarding increase of pravastatin dose for greater LDL reduction/ASCVD risk reduction.  ? Patient verbalizes understanding.  Follow up with [name] regarding medication assistance re-enrollment for 2022 calendar year. ? Note received coordination of care message from [facility] CPhT Noreene Larsson Simcox letting me know that she receieved Trulicity medication assistance re-enrollment application back from patient, but that income box was left blank ? Patient states income same as last year and household size =1. ? Collaborate with [facility] CPhT today to provide this update  Follow up with patient regarding COVID-19 prevention/vaccination ? Patient reports is planning to received COVID-19 booster dose on 12/6  Patient Self Care  Activities:   Self administers all medications as prescribed o Using weekly pillbox as adherence tool  To check blood sugars and keep log of results  To contact pharmacy to order refills of medication  Calls provider office for new concerns or questions.  Patient to eat regular meals  Please see past updates related to this goal by clicking on the "Past Updates" button in the selected goal         The patient verbalized understanding of instructions, educational materials, and care plan provided today and declined offer to receive copy of patient instructions, educational materials, and care plan.   Telephone follow up appointment with care management team member scheduled for: 02/07/2020 at 2:30 pm  [name], PharmD, [facility] Clinical Pharmacist [facility] [phone]

## 2019-12-29 ENCOUNTER — Telehealth: Payer: Medicare HMO | Admitting: Family Medicine

## 2020-01-10 ENCOUNTER — Ambulatory Visit: Payer: Medicare HMO | Admitting: Pharmacist

## 2020-01-10 DIAGNOSIS — E785 Hyperlipidemia, unspecified: Secondary | ICD-10-CM

## 2020-01-10 DIAGNOSIS — E113293 Type 2 diabetes mellitus with mild nonproliferative diabetic retinopathy without macular edema, bilateral: Secondary | ICD-10-CM

## 2020-01-10 NOTE — Patient Instructions (Signed)
Thank you allowing the Chronic Care Management Team to be a part of your care! It was a pleasure speaking with you today!     CCM (Chronic Care Management) Team    Alto Denver RN, MSN, CCM Nurse Care Coordinator  [phone]   [name] PharmD  Clinical Pharmacist  [phone]   Dickie La LCSW Clinical Social Worker [phone]  Visit Information  Goals Addressed              This Visit's Progress   .  PharmD - "I want to get my A1C down" (pt-stated)        CARE PLAN ENTRY (see longtitudinal plan of care for additional care plan information)  Current Barriers:    . Financial Barriers . Knowledge deficits related to nutrition with diabetes  Pharmacist Clinical Goal(s):  [name] Over the next 30 days, patient will continue to work with PharmD to address needs related to address needs related to diabetes management.  . Over the next 30 days, patient will demonstrate maintained medication adherence as evidenced by patient report of adherence  Interventions:    Medication Assistance . Received a coordination of care message from [facility] CPhT Noreene Larsson Simcox letting me know that patient APPROVED for re-enrollment in [name] patient assistance program for Trulicity for 2022 calendar year.  . Follow up with patient today to let him know of approval for patient assistance re-enrollment ? Counsel patient that medication should auto-ship when due, but to follow up with assistance program if <1 month supply remaining and has not received refill.  Medication Adherence . Note patient uses weekly pillbox as adherence aid . Confirms started taking pravastatin 40 mg daily as directed ? Reports tolerating increased dose  COVID-19 Prevention/vaccination . Reports received booster dose of Pfizer COVID-19 vaccine from [facility] on 12/6 (update in chart)  Patient Self Care Activities:  . Self administers all medications as prescribed o Using weekly pillbox as adherence tool . To check  blood sugars and keep log of results . To contact pharmacy to order refills of medication . Calls provider office for new concerns or questions. . Patient to eat regular meals  Please see past updates related to this goal by clicking on the "Past Updates" button in the selected goal         The patient verbalized understanding of instructions, educational materials, and care plan provided today and declined offer to receive copy of patient instructions, educational materials, and care plan.   Telephone follow up appointment with care management team member scheduled for: 02/07/2020 at 2:30 pm  [name], PharmD, [facility] Clinical Pharmacist [facility] [phone]

## 2020-01-10 NOTE — Chronic Care Management (AMB) (Signed)
Chronic Care Management   Follow Up Note   01/10/2020 Name: [name]. MRN: [id number] DOB: [birth year]  Referred by: [name], DO Reason for referral : Chronic Care Management (Patient Phone Call)   [name]. is a 74 y.o. year old male who is a primary care patient of [name], DO. The CCM team was consulted for assistance with chronic disease management and care coordination needs.    I reached out to [name]. by phone today.   Review of patient status, including review of consultants reports, relevant laboratory and other test results, and collaboration with appropriate care team members and the patient's provider was performed as part of comprehensive patient evaluation and provision of chronic care management services.    SDOH (Social Determinants of Health) assessments performed: No See Care Plan activities for detailed interventions related to [facility])    Lab Results  Component Value Date   HGBA1C 6.6 (H) 12/06/2019    Outpatient Encounter Medications as of 01/10/2020  Medication Sig Note  . pravastatin (PRAVACHOL) 40 MG tablet Take 1 tablet (40 mg total) by mouth daily.   [name] ACCU-CHEK AVIVA PLUS test strip Check sugar up to 2 x daily   . Accu-Chek FastClix Lancets MISC Check sugar up to 2 x daily   . Accu-Chek Softclix Lancets lancets Check sugar up to 2 x daily   . acetaminophen (TYLENOL) 325 MG tablet Take 2 tablets (650 mg total) by mouth every 6 (six) hours as needed for mild pain or headache (fever >/= 101).   [name] adapalene (DIFFERIN) 0.1 % gel Apply topically at bedtime. As needed   . Blood Glucose Monitoring Suppl (ACCU-CHEK AVIVA PLUS) w/Device KIT Use to check blood sugar up to x 2 daily   . Dulaglutide (TRULICITY) 1.5 BT/5.9RC SOPN Inject 1.5 mg into the skin once a week. 11/19/2018: On Mondays  . lisinopril-hydrochlorothiazide (ZESTORETIC) 20-12.5 MG tablet Take 0.5 tablets by mouth daily.   . metFORMIN (GLUCOPHAGE)  1000 MG tablet Take 1 tablet (1,000 mg total) by mouth 2 (two) times daily with a meal.   . Selenium Sulfide 2.25 % SHAM APPLY EXTERNALLY TO THE AFFECTED AREA DAILY AS NEEDED FOR IRRITATION   . sildenafil (REVATIO) 20 MG tablet Take 2-5 pills about 30 min prior to sex.    No facility-administered encounter medications on file as of 01/10/2020.    Goals Addressed              This Visit's Progress   .  PharmD - "I want to get my A1C down" (pt-stated)        CARE PLAN ENTRY (see longtitudinal plan of care for additional care plan information)  Current Barriers:    . Financial Barriers . Knowledge deficits related to nutrition with diabetes  Pharmacist Clinical Goal(s):  [name] Over the next 30 days, patient will continue to work with PharmD to address needs related to address needs related to diabetes management.  . Over the next 30 days, patient will demonstrate maintained medication adherence as evidenced by patient report of adherence  Interventions:    Medication Assistance . Received a coordination of care message from [place] Simcox letting me know that patient APPROVED for re-enrollment in [employer] patient assistance program for Trulicity for 1638 calendar year.  . Follow up with patient today to let him know of approval for patient assistance re-enrollment ? Counsel patient that medication should auto-ship when due, but to follow up with assistance  program if <1 month supply remaining and has not received refill.  Medication Adherence . Note patient uses weekly pillbox as adherence aid . Confirms started taking pravastatin 40 mg daily as directed ? Reports tolerating increased dose  COVID-19 Prevention/vaccination . Reports received booster dose of Pfizer COVID-19 vaccine from [facility] on 12/6 (update in chart)  Patient Self Care Activities:  . Self administers all medications as prescribed o Using weekly pillbox as adherence tool . To check blood sugars and keep log of  results . To contact pharmacy to order refills of medication . Calls provider office for new concerns or questions. . Patient to eat regular meals  Please see past updates related to this goal by clicking on the "Past Updates" button in the selected goal         Plan  Telephone follow up appointment with care management team member scheduled for: 02/07/2020 at 2:30 pm  Harlow Asa, PharmD, [place] [phone]

## 2020-01-24 ENCOUNTER — Telehealth: Payer: Self-pay | Admitting: Family Medicine

## 2020-01-24 ENCOUNTER — Ambulatory Visit (INDEPENDENT_AMBULATORY_CARE_PROVIDER_SITE_OTHER): Payer: Medicare HMO

## 2020-01-24 ENCOUNTER — Other Ambulatory Visit: Payer: Self-pay

## 2020-01-24 DIAGNOSIS — Z23 Encounter for immunization: Secondary | ICD-10-CM | POA: Diagnosis not present

## 2020-01-24 DIAGNOSIS — Z789 Other specified health status: Secondary | ICD-10-CM

## 2020-01-24 NOTE — Telephone Encounter (Signed)
PPD given and patient will come back next week for blood drawn and to read PPD on Wednesday.

## 2020-01-24 NOTE — Telephone Encounter (Signed)
Patient walked into office today [facility] Mon 01/24/20 - he has form from [employer] to be filled out, health examination.  Last physical done 12/14/19.  He needs new PPD test - placed today will return this week 48-72 hr  Tdap was done 02/04/14  He needs proof of MMR / Hep B Vaccines - no record, will draw lab next week Monday [facility] for titers  Once lab results reviewed will sign form and return to patient  Alexander [name], DO [facility] 01/24/2020, 2:09 PM  

## 2020-01-26 LAB — TB SKIN TEST
Induration: 0 mm
TB Skin Test: NEGATIVE

## 2020-01-31 ENCOUNTER — Other Ambulatory Visit: Payer: Self-pay | Admitting: *Deleted

## 2020-01-31 DIAGNOSIS — Z789 Other specified health status: Secondary | ICD-10-CM | POA: Diagnosis not present

## 2020-02-01 LAB — HEPATITIS B SURFACE ANTIBODY,QUALITATIVE: Hep B S Ab: NONREACTIVE

## 2020-02-01 LAB — MEASLES/MUMPS/RUBELLA IMMUNITY
Mumps IgG: >300 AU/mL
Rubella: 16.8 Index
Rubeola IgG: >300 AU/mL

## 2020-02-02 ENCOUNTER — Other Ambulatory Visit: Payer: Self-pay

## 2020-02-02 ENCOUNTER — Telehealth: Payer: Self-pay | Admitting: Family Medicine

## 2020-02-02 ENCOUNTER — Ambulatory Visit (INDEPENDENT_AMBULATORY_CARE_PROVIDER_SITE_OTHER): Payer: Medicare HMO

## 2020-02-02 DIAGNOSIS — Z789 Other specified health status: Secondary | ICD-10-CM

## 2020-02-02 NOTE — Telephone Encounter (Signed)
Completed patient's [employer].  He will need Hep B vaccine (HeplisavB) x 2 doses. 30 days apart to be completely vaccinated.  He may return to work in the meantime, while waiting on vaccine.  I will forward to front office to schedule patient for x 2 nurse visit for Hep B Vaccine series.  Saralyn Pilar, DO [facility] 02/02/2020, 8:09 AM

## 2020-02-02 NOTE — Telephone Encounter (Signed)
Great. Thanks for letting me know.

## 2020-02-07 ENCOUNTER — Telehealth: Payer: Self-pay

## 2020-02-07 NOTE — Chronic Care Management (AMB) (Signed)
  Care Management   Note  02/07/2020 Name: [name]. MRN: [id number] DOB: 01/15/1946  [name]. is a 75 y.o. year old male who is a primary care patient of [name], DO and is actively engaged with the care management team. I reached out to [name]. by phone today to assist with re-scheduling a follow up visit with the Pharmacist  Follow up plan: Unsuccessful telephone outreach attempt made. A HIPAA compliant phone message was left for the patient providing contact information and requesting a return call.  The care management team will reach out to the patient again over the next 4 days.  If patient returns call to provider office, please advise to call Embedded Care Management Care Guide Penne Lash  at [phone]  Penne Lash, RMA Care Guide, Embedded Care Coordination [facility]  [place], [place] [address] Direct Dial: [phone] [email or web address]

## 2020-02-28 ENCOUNTER — Other Ambulatory Visit: Payer: Self-pay

## 2020-03-03 NOTE — Chronic Care Management (AMB) (Signed)
  Care Management   Note  03/03/2020 Name: [name]. MRN: [id number] DOB: February 01, 1945  [name]. is a 75 y.o. year old male who is a primary care patient of [name], DO and is actively engaged with the care management team. I reached out to [name]. by phone today to assist with re-scheduling a follow up visit with the Pharmacist  Follow up plan: Telephone appointment with care management team member scheduled for:03/13/2020  Penne Lash, RMA Care Guide, Embedded Care Coordination [facility]  [place], [place] [address] Direct Dial: [phone] [email or web address]

## 2020-03-06 ENCOUNTER — Encounter: Payer: Self-pay | Admitting: Family Medicine

## 2020-03-06 ENCOUNTER — Ambulatory Visit (INDEPENDENT_AMBULATORY_CARE_PROVIDER_SITE_OTHER): Payer: Medicare HMO

## 2020-03-06 ENCOUNTER — Other Ambulatory Visit: Payer: Self-pay

## 2020-03-06 ENCOUNTER — Ambulatory Visit: Payer: Medicare HMO

## 2020-03-06 DIAGNOSIS — Z789 Other specified health status: Secondary | ICD-10-CM | POA: Diagnosis not present

## 2020-03-06 DIAGNOSIS — E113293 Type 2 diabetes mellitus with mild nonproliferative diabetic retinopathy without macular edema, bilateral: Secondary | ICD-10-CM

## 2020-03-06 LAB — POCT GLYCOSYLATED HEMOGLOBIN (HGB A1C): Hemoglobin A1C: 6.9 % — AB (ref 4.0–5.6)

## 2020-03-06 MED ORDER — HEPATITIS B VAC RECOMBINANT 10 MCG/ML IJ SUSP: 1.0000 mL | Freq: Once | INTRAMUSCULAR | Status: DC

## 2020-03-13 ENCOUNTER — Ambulatory Visit (INDEPENDENT_AMBULATORY_CARE_PROVIDER_SITE_OTHER): Payer: Medicare HMO | Admitting: Pharmacist

## 2020-03-13 DIAGNOSIS — E785 Hyperlipidemia, unspecified: Secondary | ICD-10-CM

## 2020-03-13 DIAGNOSIS — E113293 Type 2 diabetes mellitus with mild nonproliferative diabetic retinopathy without macular edema, bilateral: Secondary | ICD-10-CM

## 2020-03-13 DIAGNOSIS — E1169 Type 2 diabetes mellitus with other specified complication: Secondary | ICD-10-CM | POA: Diagnosis not present

## 2020-03-13 DIAGNOSIS — I10 Essential (primary) hypertension: Secondary | ICD-10-CM

## 2020-03-13 NOTE — Chronic Care Management (AMB) (Signed)
Chronic Care Management Pharmacy Note  03/13/2020 Name:  [name]. MRN:  [id number] DOB:  December 04, 1945  Subjective: [name]. is an 75 y.o. year old male who is a primary patient of [name], DO.  The CCM team was consulted for assistance with disease management and care coordination needs.    Engaged with patient by telephone for follow up visit in response to provider referral for pharmacy case management and/or care coordination services.   Consent to Services:  The patient was given information about Chronic Care Management services, agreed to services, and gave verbal consent prior to initiation of services.  Please see initial visit note for detailed documentation.   Objective:  Lab Results  Component Value Date   CREATININE 1.11 12/06/2019   CREATININE 1.03 12/03/2018   CREATININE 0.71 10/27/2018    Lab Results  Component Value Date   HGBA1C 6.9 (A) 03/06/2020       Component Value Date/Time   CHOL 198 12/06/2019 0801   CHOL 204 (H) 05/10/2015 1609   TRIG 76 12/06/2019 0801   HDL 94 12/06/2019 0801   HDL 101 05/10/2015 1609   CHOLHDL 2.1 12/06/2019 0801   LDLCALC 87 12/06/2019 0801    Clinical ASCVD: No  The 10-year ASCVD risk score Mikey Bussing DC Jr., et al., 2013) is: 46%   Values used to calculate the score:     Age: 10 years     Sex: Male     Is Non-Hispanic African American: Yes     Diabetic: Yes     Tobacco smoker: Yes     Systolic Blood Pressure: 878 mmHg     Is BP treated: Yes     HDL Cholesterol: 94 mg/dL     Total Cholesterol: 198 mg/dL     BP Readings from Last 3 Encounters:  12/14/19 124/70  08/13/19 (!) 149/78  06/10/19 134/77    Assessment: Review of patient past medical history, allergies, medications, health status, including review of consultants reports, laboratory and other test data, was performed as part of comprehensive evaluation and provision of chronic care management services.   SDOH:  (Social  Determinants of Health) assessments and interventions performed: none   CCM Care Plan  Allergies  Allergen Reactions  . 2,4-D Dimethylamine (Amisol) Other (See Comments)  . Tetanus Toxoid Nausea And Vomiting  . Tetanus-Diphtheria Toxoids Td Other (See Comments)    Medications Reviewed Today    Reviewed by [name], [facility] (Pharmacist) on 12/20/19 at 1207  Med List Status: <None>  Medication Order Taking? Sig Documenting Provider Last Dose Status Informant  ACCU-CHEK AVIVA PLUS test strip [id number]  Check sugar up to 2 x daily [name], DO  Active Pharmacy Records  Accu-Chek FastClix Lancets MISC [id number]  Check sugar up to 2 x daily [name], DO  Active Pharmacy Records  Accu-Chek Softclix Lancets lancets [id number]  Check sugar up to 2 x daily [name], DO  Active Pharmacy Records  acetaminophen (TYLENOL) 325 MG tablet [id number]  Take 2 tablets (650 mg total) by mouth every 6 (six) hours as needed for mild pain or headache (fever >/= 101). [name], MD  Active   adapalene (DIFFERIN) 0.1 % gel [id number]  Apply topically at bedtime. As needed [name], DO  Active   Blood Glucose Monitoring Suppl (ACCU-CHEK AVIVA PLUS) w/Device KIT [id number]  Use to check blood sugar up to x 2 daily [name], DO  Active  Pharmacy Records  Dulaglutide (TRULICITY) 1.5 KF/2.7MD SOPN [id number] Yes Inject 1.5 mg into the skin once a week. [name], DO Taking Active Pharmacy Records           Med Note [facility], [facility] A   Thu Nov 19, 2018  2:04 PM) On Mondays  lisinopril-hydrochlorothiazide (ZESTORETIC) 20-12.5 MG tablet [id number] Yes Take 0.5 tablets by mouth daily. [name], DO Taking Active   metFORMIN (GLUCOPHAGE) 1000 MG tablet [id number] Yes Take 1 tablet (1,000 mg total) by mouth 2 (two) times daily with a meal. Parks Ranger, [name], DO Taking Active   pravastatin (PRAVACHOL)  10 MG tablet [id number] Yes Take 1 tablet (10 mg total) by mouth daily. [name], DO Taking Active   Selenium Sulfide 2.25 % SHAM [id number]  APPLY EXTERNALLY TO THE AFFECTED AREA DAILY AS NEEDED FOR IRRITATION [name], DO  Active Pharmacy Records  sildenafil (REVATIO) 20 MG tablet [id number]  Take 2-5 pills about 30 min prior to sex. [name], DO  Active           Patient Active Problem List   Diagnosis Date Noted  . Throat swelling 08/13/2019  . External hemorrhoid 10/14/2017  . Diabetic retinopathy ([place]) 08/02/2016  . Acne vulgaris 12/06/2015  . Controlled type 2 diabetes mellitus with retinopathy ([place]) 07/19/2014  . ED (erectile dysfunction) of organic origin 11/28/2011  . Essential hypertension 11/28/2011  . Hyperlipidemia associated with type 2 diabetes mellitus ([place]) 11/28/2011    Conditions to be addressed/monitored: HTN, HLD and DMII  Care Plan : PharmD - Med Mgmt  Updates made by [name], [place] since 03/13/2020 12:00 AM    Problem: Disease Progression     Long-Range Goal: Disease Progression Prevented or Minimized   Start Date: 03/13/2020  Expected End Date: 06/11/2020  This Visit's Progress: On track  Priority: High  Note:   Current Barriers:  . Financial Barriers o Patient APPROVED to received Trulicity through [place] patient assistance program through 01/20/2021 . Knowledge deficits related to nutrition with diabetes  Pharmacist Clinical Goal(s):  [name] Over the next 90 days, patient will maintain control of blood sugar as evidenced by A1C <7%  through collaboration with PharmD and provider.  .   Interventions: . 1:1 collaboration with [name], DO regarding development and update of comprehensive plan of care as evidenced by provider attestation and co-signature . Inter-disciplinary care team collaboration (see longitudinal plan of care) . 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 Controlled; current treatment: . Metformin 1000 mg twice daily . Trulicity 1.5 mg weekly on Mondays . Current glucose readings: fasting glucose: 110s-150s . Counsel on impact of diet and exercise on blood sugar . Counsel on importance of having regular well-balanced meals and limiting carbohydrate portion sizes . Encourage patient to work towards an exercise goal of 30 minutes x 5 days/week  Hypertension: . Current treatment: lisinopril/HCTZ 20-12.5 mg - 1/2 tablet daily . Current home readings: last checked yesterday: 135/80, HR 82 ? Have discussed impact of salt/sodium on blood pressure and encouraged patient to review nutrition labels for sodium content ? Encourage patient to continue to monitor BP outside office, bring log to medical appointments and if persistently >140/90 or new symptoms notify office sooner  Medication Adherence . Note patient uses weekly pillbox as adherence aid . Denies missed doses . Remind patient to review supply  of medications and diabetes testing supplies and order from pharmacies in advance   Will mail patient new blood sugar and blood pressure monitoring logs as requested  Patient Goals/Self-Care Activities . Over the next 90 days, patient will:  . Self administers all medications as prescribed o Using weekly pillbox as adherence tool . Check blood sugars and keep log of results . Contact pharmacy to order refills of medication . Call provider office for new concerns or questions.  Follow Up Plan: Telephone follow up appointment with care management team member scheduled for: 4/18 at 1:15 pm      Medication Assistance: Trulicity obtained through [place] medication assistance program.  Enrollment ends 01/20/2021  Follow Up:  Patient agrees to Care Plan and Follow-up.  Harlow Asa, PharmD, [place] [phone]

## 2020-03-13 NOTE — Patient Instructions (Signed)
Visit Information  PATIENT GOALS: Goals Addressed              This Visit's Progress   .  Pharmacy Goals        Our goal A1c is less than 7%. This corresponds with fasting sugars less than 130 and 2 hour after meal sugars less than 180. Please check your blood sugar and keep a record  Please check your home blood pressure, keep a log of the results and bring this with you to your medical appointments.  Our goal bad cholesterol, or LDL, is less than 70 . This is why it is important to continue taking your pravastatin  Feel free to call me with any questions or concerns. I look forward to our next call!  [name], PharmD, BCACP Clinical Pharmacist [facility] [phone]        The patient verbalized understanding of instructions, educational materials, and care plan provided today and declined offer to receive copy of patient instructions, educational materials, and care plan.   Telephone follow up appointment with care management team member scheduled for: 4/18 at 1:15 pm  [name], PharmD, [facility] Clinical Pharmacist [facility] [phone]

## 2020-05-08 ENCOUNTER — Ambulatory Visit (INDEPENDENT_AMBULATORY_CARE_PROVIDER_SITE_OTHER): Payer: Medicare HMO | Admitting: Pharmacist

## 2020-05-08 DIAGNOSIS — E113293 Type 2 diabetes mellitus with mild nonproliferative diabetic retinopathy without macular edema, bilateral: Secondary | ICD-10-CM

## 2020-05-08 DIAGNOSIS — E785 Hyperlipidemia, unspecified: Secondary | ICD-10-CM

## 2020-05-08 DIAGNOSIS — I10 Essential (primary) hypertension: Secondary | ICD-10-CM

## 2020-05-08 DIAGNOSIS — E1169 Type 2 diabetes mellitus with other specified complication: Secondary | ICD-10-CM

## 2020-05-08 NOTE — Chronic Care Management (AMB) (Signed)
Chronic Care Management Pharmacy Note  05/08/2020 Name:  Melecio Cueto. MRN:  [id number] DOB:  1945-10-17  Subjective: Balraj Brayfield. is an 75 y.o. year old male who is a primary patient of [name], DO.  The CCM team was consulted for assistance with disease management and care coordination needs.    Engaged with patient by telephone for follow up visit in response to provider referral for pharmacy case management and/or care coordination services.   Consent to Services:  The patient was given information about Chronic Care Management services, agreed to services, and gave verbal consent prior to initiation of services.  Please see initial visit note for detailed documentation.   Patient Care Team: [name], DO as PCP - General (Family Medicine) [place], Virl Diamond, [place] as Pharmacist [place], Dalbert Garnet, RN (Inactive) as Case Manager Greg Cutter, LCSW as Social Worker (Licensed Holiday representative)  Recent office visits: none  Hospital visits: None in previous 6 months  Objective:  Lab Results  Component Value Date   CREATININE 1.11 12/06/2019   CREATININE 1.03 12/03/2018   CREATININE 0.71 10/27/2018    Lab Results  Component Value Date   HGBA1C 6.9 (A) 03/06/2020   Last diabetic Eye exam:  Lab Results  Component Value Date/Time   HMDIABEYEEXA No Retinopathy 09/13/2019 12:00 AM    Last diabetic Foot exam: No results found for: HMDIABFOOTEX      Component Value Date/Time   CHOL 198 12/06/2019 0801   CHOL 204 (H) 05/10/2015 1609   TRIG 76 12/06/2019 0801   HDL 94 12/06/2019 0801   HDL 101 05/10/2015 1609   CHOLHDL 2.1 12/06/2019 0801   LDLCALC 87 12/06/2019 0801    Hepatic Function Latest Ref Rng & Units 12/06/2019 12/03/2018 10/27/2018  Total Protein 6.1 - 8.1 g/dL 6.6 6.7 5.6(L)  Albumin 3.5 - 5.0 g/dL - - 2.4(L)  AST 10 - 35 U/L _0 ALT 9 - 46 U/L _1 Alk Phosphatase 38 - 126 U/L - - 49  Total  Bilirubin 0.2 - 1.2 mg/dL 0.8 0.8 0.6    Clinical ASCVD: No  The 10-year ASCVD risk score Mikey Bussing DC Jr., et al., 2013) is: 46%   Values used to calculate the score:     Age: 83 years     Sex: Male     Is Non-Hispanic African American: Yes     Diabetic: Yes     Tobacco smoker: Yes     Systolic Blood Pressure: 253 mmHg     Is BP treated: Yes     HDL Cholesterol: 94 mg/dL     Total Cholesterol: 198 mg/dL     Social History   Tobacco Use  Smoking Status Current Some Day Smoker  . Types: Cigarettes  Smokeless Tobacco Current User  Tobacco Comment   pack of cigarettes last 1-2 months - doesnt inhale   BP Readings from Last 3 Encounters:  12/14/19 124/70  08/13/19 (!) 149/78  06/10/19 134/77   Pulse Readings from Last 3 Encounters:  12/14/19 63  08/13/19 61  06/10/19 67   Wt Readings from Last 3 Encounters:  12/14/19 194 lb (88 kg)  06/10/19 187 lb (84.8 kg)  12/10/18 184 lb (83.5 kg)    Assessment: Review of patient past medical history, allergies, medications, health status, including review of consultants reports, laboratory and other test data, was performed as part of comprehensive evaluation and provision of chronic care management  services.   SDOH:  (Social Determinants of Health) assessments and interventions performed: none   CCM Care Plan  Allergies  Allergen Reactions  . 2,4-D Dimethylamine (Amisol) Other (See Comments)  . Tetanus Toxoid Nausea And Vomiting  . Tetanus-Diphtheria Toxoids Td Other (See Comments)    Medications Reviewed Today    Reviewed by [name], RPH-CPP (Pharmacist) on 05/08/20 at 1333  Med List Status: <None>  Medication Order Taking? Sig Documenting Provider Last Dose Status Informant  ACCU-CHEK AVIVA PLUS test strip [id number]  Check sugar up to 2 x daily [name], DO  Active Pharmacy Records  Accu-Chek FastClix Lancets MISC [id number]  Check sugar up to 2 x daily [name], DO  Active  Pharmacy Records  Accu-Chek Softclix Lancets lancets [id number]  Check sugar up to 2 x daily [name], DO  Active Pharmacy Records  acetaminophen (TYLENOL) 325 MG tablet [id number] No Take 2 tablets (650 mg total) by mouth every 6 (six) hours as needed for mild pain or headache (fever >/= 101).  Patient not taking: Reported on 05/08/2020   [name], MD Not Taking Active   adapalene (DIFFERIN) 0.1 % gel [id number] Yes Apply topically at bedtime. As needed [name], DO Taking Active   Blood Glucose Monitoring Suppl (ACCU-CHEK AVIVA PLUS) w/Device KIT [id number]  Use to check blood sugar up to x 2 daily [name], DO  Active Pharmacy Records  Dulaglutide (TRULICITY) 1.5 XK/4.8JE [facility] [id number] Yes Inject 1.5 mg into the skin once a week. [name], DO Taking Active Pharmacy Records           Med Note [facility], [facility] A   Thu Nov 19, 2018  2:04 PM) On Mondays  lisinopril-hydrochlorothiazide (ZESTORETIC) 20-12.5 MG tablet [id number] Yes Take 0.5 tablets by mouth daily. [name], DO Taking Active   metFORMIN (GLUCOPHAGE) 1000 MG tablet [id number] Yes Take 1 tablet (1,000 mg total) by mouth 2 (two) times daily with a meal. Parks Ranger, [name], DO Taking Active   pravastatin (PRAVACHOL) 40 MG tablet [id number] Yes Take 1 tablet (40 mg total) by mouth daily. [name], DO Taking Active   Selenium Sulfide 2.25 % SHAM [id number] No APPLY EXTERNALLY TO THE AFFECTED AREA DAILY AS NEEDED FOR IRRITATION  Patient not taking: Reported on 05/08/2020   [name], DO Not Taking Active Pharmacy Records  sildenafil (REVATIO) 20 MG tablet [id number] No Take 2-5 pills about 30 min prior to sex.  Patient not taking: Reported on 05/08/2020   [name], DO Not Taking Active           Patient Active Problem List   Diagnosis Date Noted  . Throat swelling 08/13/2019  . External hemorrhoid  10/14/2017  . Diabetic retinopathy ([place]) 08/02/2016  . Acne vulgaris 12/06/2015  . Controlled type 2 diabetes mellitus with retinopathy ([place]) 07/19/2014  . ED (erectile dysfunction) of organic origin 11/28/2011  . Essential hypertension 11/28/2011  . Hyperlipidemia associated with type 2 diabetes mellitus ([place]) 11/28/2011    Immunization History  Administered Date(s) Administered  . Hepb-cpg 02/02/2020, 03/06/2020  . Influenza, High Dose Seasonal PF 11/07/2016, 10/28/2017, 10/02/2018, 11/05/2019  . Influenza-Unspecified 12/21/2013, 09/06/2015, 10/22/2015  . PFIZER(Purple Top)SARS-COV-2 Vaccination 04/23/2019, 05/14/2019, 12/27/2019  . PPD Test 01/24/2020  . Pneumococcal Conjugate-13 12/08/2014  . Pneumococcal Polysaccharide-23 05/06/2012  . Tdap 02/04/2014    Conditions to be addressed/monitored: HTN, HLD, T2DM  Care Plan : PharmD - Med Mgmt  Updates  made by [name], RPH-CPP since 05/09/2020 12:00 AM    Problem: Disease Progression     Long-Range Goal: Disease Progression Prevented or Minimized   Start Date: 03/13/2020  Expected End Date: 06/11/2020  This Visit's Progress: On track  Recent Progress: On track  Priority: High  Note:   Current Barriers:  . Financial Barriers o Patient APPROVED to received Trulicity through [place] patient assistance program through 01/20/2021 . Knowledge deficits related to nutrition with diabetes  Pharmacist Clinical Goal(s):  [name] Over the next 90 days, patient will maintain control of blood sugar as evidenced by A1C <7%  through collaboration with PharmD and provider.   Interventions: . 1:1 collaboration with [name], DO regarding development and update of comprehensive plan of care as evidenced by provider attestation and co-signature . Inter-disciplinary care team collaboration (see longitudinal plan of care) . Patient asks about an alternative to adapalene gel. Reports using as directed for irritation on face  following shaving. However, reports limited success with managing the irritation, particularly as the gel causes drying of his skin o From review of health plan website, note topical cream form of adapalene 0.1% is not covered under patient's plan o Will collaborate with PCP  Diabetes: . Controlled; current treatment: o Metformin 1000 mg twice daily o Trulicity 1.5 mg weekly on Mondays . Current glucose readings ranging: 115-140s . Have counseled on impact of diet and exercise on blood sugar o Counseled on importance of having regular well-balanced meals and limiting carbohydrate portion sizes o Encouraged patient to work towards an exercise goal of 30 minutes x 5 days/week . Counsel on blood sugar monitoring technique to improve comfort of testing o Remind patient to not reuse lancets    Hypertension: . Current treatment: lisinopril/HCTZ 20-12.5 mg - 1/2 tablet daily . Current home readings: last checked on 4/15: 121/61, HR 71 . Have discussed impact of salt/sodium on blood pressure and encouraged patient to review nutrition labels for sodium content . Encourage patient to continue to monitor BP outside office, bring log to medical appointments and if persistently >140/90 or new symptoms notify office sooner  Medication Adherence . From review of medication dispensing history, identify patient has not picked up metformin in >6 months o Patient reports he stopped using weekly pillbox o Counsel patient on importance of medication adherence. Patient confirms will restart using weekly pillbox as adherence aid . Remind patient to review supply of medications and diabetes testing supplies and order from pharmacies in advance  Medication Assistance: . Reports has only a 2-week supply of Trulicity remaining. Reports called [name] to order a refill, but was advised that program requires provider to send in new Rx . Place coordination of care call to [facility]. Speak with Denton Ar who reviews  latest Rx, confirms refill available and will be filled and shipped to office on 4/22. Will place patient on auto refill o Follow up with patient to provide update   Patient Goals/Self-Care Activities . Over the next 90 days, patient will:  . Self administers all medications as prescribed o Using weekly pillbox as adherence tool . Check blood sugars and keep log of results . Contact pharmacy to order refills of medication . Call provider office for new concerns or questions.  Follow Up Plan: Telephone follow up appointment with care management team member scheduled for: 07/03/2020 at 1:00 PM      Medication Assistance: Patient APPROVED to received Trulicity through [place] patient assistance program through 01/20/2021  Patient's preferred  pharmacy is:  [id number] [address], [place] [address] [place] [address] Phone: [phone] Fax: [place], [place] [address] [place] [address] Phone: [phone] Fax: [phone]   Follow Up:  Patient agrees to Care Plan and Follow-up.  Harlow Asa, PharmD, [place], CPP Clinical Pharmacist [facility] [phone]

## 2020-05-09 NOTE — Patient Instructions (Signed)
Visit Information  PATIENT GOALS: Goals Addressed            This Visit's Progress   . Pharmacy Goals       Our goal A1c is less than 7%. This corresponds with fasting sugars less than 130 and 2 hour after meal sugars less than 180. Please check your blood sugar and keep a record  Please check your home blood pressure, keep a log of the results and bring this with you to your medical appointments.  Our goal bad cholesterol, or LDL, is less than 70 . This is why it is important to continue taking your pravastatin  Please restart using your weekly pillbox to organize your medications  Feel free to call me with any questions or concerns. I look forward to our next call!  [name], PharmD, BCACP Clinical Pharmacist [facility] [phone]        The patient verbalized understanding of instructions, educational materials, and care plan provided today and declined offer to receive copy of patient instructions, educational materials, and care plan.   Telephone follow up appointment with care management team member scheduled for: 07/03/2020 at 1:00 PM  [name], PharmD, [place], CPP Clinical Pharmacist [facility] [phone]

## 2020-05-10 ENCOUNTER — Ambulatory Visit: Payer: Self-pay | Admitting: Pharmacist

## 2020-05-10 DIAGNOSIS — E113293 Type 2 diabetes mellitus with mild nonproliferative diabetic retinopathy without macular edema, bilateral: Secondary | ICD-10-CM | POA: Diagnosis not present

## 2020-05-10 DIAGNOSIS — L700 Acne vulgaris: Secondary | ICD-10-CM

## 2020-05-10 DIAGNOSIS — I10 Essential (primary) hypertension: Secondary | ICD-10-CM

## 2020-05-10 DIAGNOSIS — E1169 Type 2 diabetes mellitus with other specified complication: Secondary | ICD-10-CM | POA: Diagnosis not present

## 2020-05-10 DIAGNOSIS — E785 Hyperlipidemia, unspecified: Secondary | ICD-10-CM

## 2020-05-10 NOTE — Chronic Care Management (AMB) (Signed)
Chronic Care Management Pharmacy Note  05/10/2020 Name:  [name]. MRN:  [id number] DOB:  Jan 04, 1946  Subjective: [name]. is an 75 y.o. year old male who is a primary patient of Christopher Hauser, DO.  The CCM team was consulted for assistance with disease management and care coordination needs.    Engaged with patient by telephone for follow up visit in response to provider referral for pharmacy case management and/or care coordination services.   Consent to Services:  The patient was given information about Chronic Care Management services, agreed to services, and gave verbal consent prior to initiation of services.  Please see initial visit note for detailed documentation.   Patient Care Team: Christopher Hauser, DO as PCP - General (Family Medicine) Cope, [name], RPH-CPP as Pharmacist Christopher, Dalbert Garnet, RN (Inactive) as Case Manager Christopher Cutter, LCSW as Social Worker (Licensed Holiday representative)  Recent office visits: None  Hospital visits: None in previous 6 months  Objective:  Lab Results  Component Value Date   HGBA1C 6.9 (A) 03/06/2020    Social History   Tobacco Use  Smoking Status Current Some Day Smoker  . Types: Cigarettes  Smokeless Tobacco Current User  Tobacco Comment   pack of cigarettes last 1-2 months - doesnt inhale   BP Readings from Last 3 Encounters:  12/14/19 124/70  08/13/19 (!) 149/78  06/10/19 134/77   Pulse Readings from Last 3 Encounters:  12/14/19 63  08/13/19 61  06/10/19 67   Wt Readings from Last 3 Encounters:  12/14/19 194 lb (88 kg)  06/10/19 187 lb (84.8 kg)  12/10/18 184 lb (83.5 kg)    Assessment: Review of patient past medical history, allergies, medications, health status, including review of consultants reports, laboratory and other test data, was performed as part of comprehensive evaluation and provision of chronic care management services.   SDOH:  (Social Determinants  of Health) assessments and interventions performed: none   CCM Care Plan  Allergies  Allergen Reactions  . 2,4-D Dimethylamine (Amisol) Other (See Comments)  . Tetanus Toxoid Nausea And Vomiting  . Tetanus-Diphtheria Toxoids Td Other (See Comments)    Medications Reviewed Today    Reviewed by [name], RPH-CPP (Pharmacist) on 05/08/20 at 1333  Med List Status: <None>  Medication Order Taking? Sig Documenting Provider Last Dose Status Informant  ACCU-CHEK AVIVA PLUS test strip [id number]  Check sugar up to 2 x daily Christopher Hauser, DO  Active Pharmacy Records  Accu-Chek FastClix Lancets MISC [id number]  Check sugar up to 2 x daily Christopher Hauser, DO  Active Pharmacy Records  Accu-Chek Softclix Lancets lancets [id number]  Check sugar up to 2 x daily Christopher Hauser, DO  Active Pharmacy Records  acetaminophen (TYLENOL) 325 MG tablet [id number] No Take 2 tablets (650 mg total) by mouth every 6 (six) hours as needed for mild pain or headache (fever >/= 101).  Patient not taking: Reported on 05/08/2020   Christopher Altes, MD Not Taking Active   adapalene (DIFFERIN) 0.1 % gel [id number] Yes Apply topically at bedtime. As needed Christopher Hauser, DO Taking Active   Blood Glucose Monitoring Suppl (ACCU-CHEK AVIVA PLUS) w/Device KIT [id number]  Use to check blood sugar up to x 2 daily Christopher Hauser, DO  Active Pharmacy Records  Dulaglutide (TRULICITY) 1.5 PV/3.7SM [facility] [id number] Yes Inject 1.5 mg into the skin once a week. Christopher Hauser, DO Taking Active Pharmacy Records  Med Note [name], [name] A   Thu Nov 19, 2018  2:04 PM) On Mondays  lisinopril-hydrochlorothiazide (ZESTORETIC) 20-12.5 MG tablet [id number] Yes Take 0.5 tablets by mouth daily. Christopher Hauser, DO Taking Active   metFORMIN (GLUCOPHAGE) 1000 MG tablet [id number] Yes Take 1 tablet (1,000 mg total) by mouth 2 (two) times daily with a meal.  [name], Christopher Doughty, DO Taking Active   pravastatin (PRAVACHOL) 40 MG tablet [id number] Yes Take 1 tablet (40 mg total) by mouth daily. Christopher Hauser, DO Taking Active   Selenium Sulfide 2.25 % SHAM [id number] No APPLY EXTERNALLY TO THE AFFECTED AREA DAILY AS NEEDED FOR IRRITATION  Patient not taking: Reported on 05/08/2020   Christopher Hauser, DO Not Taking Active Pharmacy Records  sildenafil (REVATIO) 20 MG tablet [id number] No Take 2-5 pills about 30 min prior to sex.  Patient not taking: Reported on 05/08/2020   Christopher Hauser, DO Not Taking Active           Patient Active Problem List   Diagnosis Date Noted  . Throat swelling 08/13/2019  . External hemorrhoid 10/14/2017  . Diabetic retinopathy ([place]) 08/02/2016  . Acne vulgaris 12/06/2015  . Controlled type 2 diabetes mellitus with retinopathy ([place]) 07/19/2014  . ED (erectile dysfunction) of organic origin 11/28/2011  . Essential hypertension 11/28/2011  . Hyperlipidemia associated with type 2 diabetes mellitus ([place]) 11/28/2011    Immunization History  Administered Date(s) Administered  . Hepb-cpg 02/02/2020, 03/06/2020  . Influenza, High Dose Seasonal PF 11/07/2016, 10/28/2017, 10/02/2018, 11/05/2019  . Influenza-Unspecified 12/21/2013, 09/06/2015, 10/22/2015  . PFIZER(Purple Top)SARS-COV-2 Vaccination 04/23/2019, 05/14/2019, 12/27/2019  . PPD Test 01/24/2020  . Pneumococcal Conjugate-13 12/08/2014  . Pneumococcal Polysaccharide-23 05/06/2012  . Tdap 02/04/2014    Conditions to be addressed/monitored: HTN, HLD and DMII  Care Plan : PharmD - Med Mgmt  Updates made by [name], RPH-CPP since 05/10/2020 12:00 AM    Problem: Disease Progression     Long-Range Goal: Disease Progression Prevented or Minimized   Start Date: 03/13/2020  Expected End Date: 06/11/2020  This Visit's Progress: On track  Recent Progress: On track  Priority: High  Note:   Current Barriers:  . Financial  Barriers o Patient APPROVED to received Trulicity through [place] patient assistance program through 01/20/2021 . Knowledge deficits related to nutrition with diabetes  Pharmacist Clinical Goal(s):  [name] Over the next 90 days, patient will maintain control of blood sugar as evidenced by A1C <7%  through collaboration with PharmD and provider.   Interventions: . 1:1 collaboration with Christopher Hauser, DO regarding development and update of comprehensive plan of care as evidenced by provider attestation and co-signature . Inter-disciplinary care team collaboration (see longitudinal plan of care) . Collaborate with PCP regarding alternatives to adapalene gel as patient reported using as directed for irritation on face following shaving, but having limited success with managing the irritation, particularly as the gel causes drying of his skin o Provider advised other options such as benzoyl peroxide would likely cause dryness as well. Recommended patient try taking periodic breaks on use to see if drying is too much with regular use o Follow up with patient today. Mr. Jerger states that he will try taking breaks from the adapalene gel to see if helps with the drying, may alternatively try the OTC benzoyl peroxide and will plan to follow up with PCP at upcoming appointment on 5/9.  Medication/Monitoring Adherence . Confirms has restarted using weekly pillbox as discussed to aid with medication  adherence, particularly to twice daily metformin dosing . Confirms now using new lancet each time he checks his blood sugar   Patient Goals/Self-Care Activities . Over the next 90 days, patient will:  . Self administers all medications as prescribed o Using weekly pillbox as adherence tool . Check blood sugars and keep log of results . Contact pharmacy to order refills of medication . Call provider office for new concerns or questions.  Follow Up Plan: Telephone follow up appointment with care  management team member scheduled for: 07/03/2020 at 1:00 PM      Medication Assistance: APPROVED to received Trulicity through [place] patient assistance program through 01/20/2021  Patient's preferred pharmacy is:  [id number] - [address], [address] AT [address] [place] [address] Phone: [phone] Fax: [place], [address] [place] [address] Phone: [phone] Fax: [phone]  Uses pill box? Yes  Follow Up:  Patient agrees to Care Plan and Follow-up.  Harlow Asa, PharmD, Para [place], CPP Clinical Pharmacist [facility] [phone]

## 2020-05-10 NOTE — Patient Instructions (Signed)
Visit Information  PATIENT GOALS: Goals Addressed            This Visit's Progress   . Pharmacy Goals       Our goal A1c is less than 7%. This corresponds with fasting sugars less than 130 and 2 hour after meal sugars less than 180. Please check your blood sugar and keep a record  Please check your home blood pressure, keep a log of the results and bring this with you to your medical appointments.  Our goal bad cholesterol, or LDL, is less than 70 . This is why it is important to continue taking your pravastatin  Please use your weekly pillbox to organize your medications  Feel free to call me with any questions or concerns. I look forward to our next call!  [name], PharmD, BCACP Clinical Pharmacist [facility] [phone]        The patient verbalized understanding of instructions, educational materials, and care plan provided today and declined offer to receive copy of patient instructions, educational materials, and care plan.   Telephone follow up appointment with care management team member scheduled for: 07/03/2020 at 1:00 PM

## 2020-05-13 IMAGING — DX DG CHEST 1V PORT
1 series · 1 of 1 positions shown · non-contrast
Comparison: 10/22/2018

CLINICAL DATA: Shortness of breath

EXAM:
PORTABLE CHEST 1 VIEW

[chest]
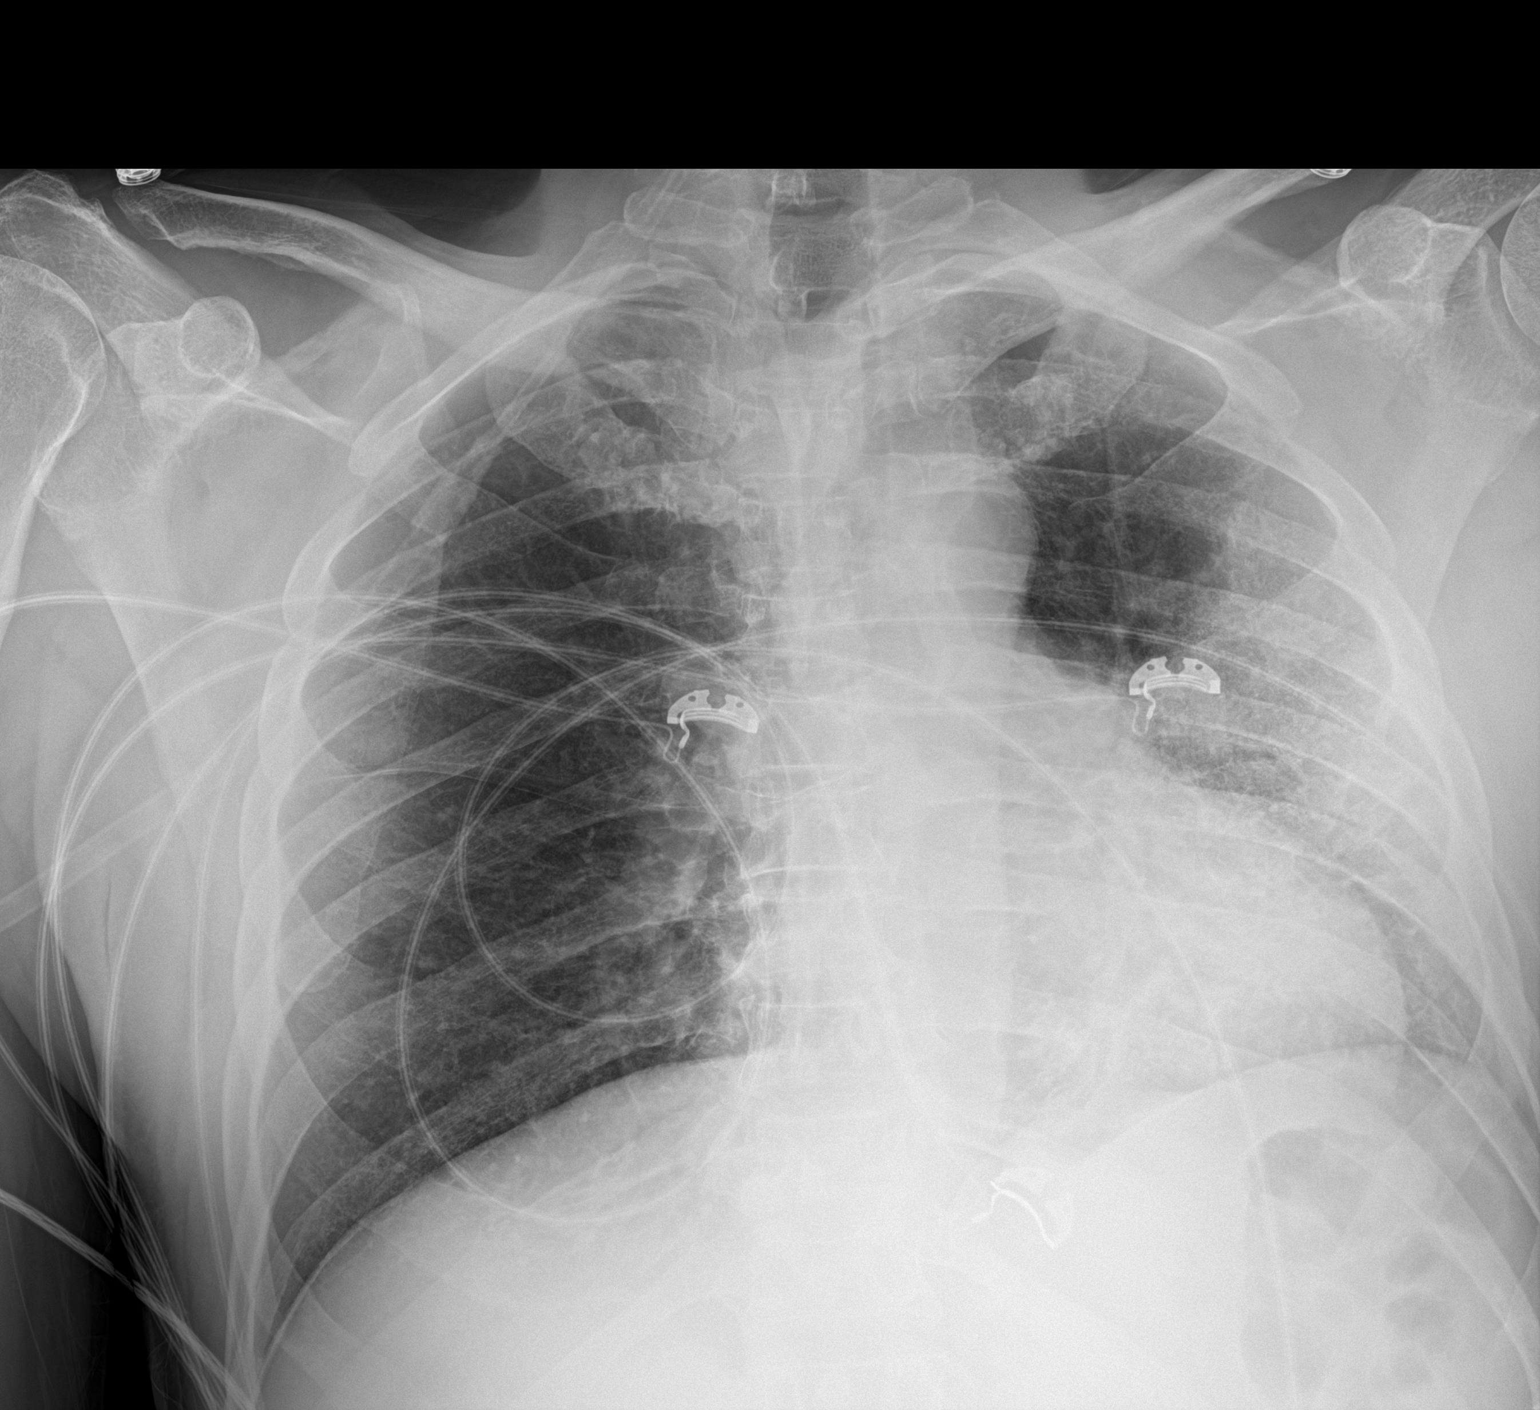

[1 of 1 positions shown; findings below may reference images not displayed]

FINDINGS: Cardiac shadow is stable but accentuated by the portable technique.
The right lung is clear. Patchy parenchymal density is noted
throughout the mid left lung similar to that seen on the prior exam
consistent with the given clinical history. No bony abnormality is
noted.
IMPRESSION: Stable left-sided infiltrate with improved aeration in the right
lung.

## 2020-05-29 ENCOUNTER — Encounter: Payer: Self-pay | Admitting: Family Medicine

## 2020-05-29 ENCOUNTER — Ambulatory Visit (INDEPENDENT_AMBULATORY_CARE_PROVIDER_SITE_OTHER): Payer: Medicare HMO | Admitting: Family Medicine

## 2020-05-29 ENCOUNTER — Other Ambulatory Visit: Payer: Self-pay | Admitting: Family Medicine

## 2020-05-29 ENCOUNTER — Other Ambulatory Visit: Payer: Self-pay

## 2020-05-29 VITALS — BP 128/77 | HR 67 | Ht 71.0 in | Wt 189.8 lb

## 2020-05-29 DIAGNOSIS — Z0000 Encounter for general adult medical examination without abnormal findings: Secondary | ICD-10-CM

## 2020-05-29 DIAGNOSIS — E785 Hyperlipidemia, unspecified: Secondary | ICD-10-CM

## 2020-05-29 DIAGNOSIS — E113293 Type 2 diabetes mellitus with mild nonproliferative diabetic retinopathy without macular edema, bilateral: Secondary | ICD-10-CM | POA: Diagnosis not present

## 2020-05-29 DIAGNOSIS — E1169 Type 2 diabetes mellitus with other specified complication: Secondary | ICD-10-CM

## 2020-05-29 DIAGNOSIS — I10 Essential (primary) hypertension: Secondary | ICD-10-CM

## 2020-05-29 DIAGNOSIS — N529 Male erectile dysfunction, unspecified: Secondary | ICD-10-CM | POA: Diagnosis not present

## 2020-05-29 DIAGNOSIS — R351 Nocturia: Secondary | ICD-10-CM

## 2020-05-29 LAB — POCT GLYCOSYLATED HEMOGLOBIN (HGB A1C): Hemoglobin A1C: 6.7 % — AB (ref 4.0–5.6)

## 2020-05-29 MED ORDER — SILDENAFIL CITRATE 20 MG PO TABS: ORAL_TABLET | ORAL | 5 refills | Status: DC

## 2020-05-29 NOTE — Progress Notes (Signed)
Subjective:    Patient ID: [name]., male    DOB: December 09, 1945, 75 y.o.   MRN: [id number]  [name]. is a 75 y.o. male presenting on 05/29/2020 for Diabetes   HPI   CHRONIC DM, Type 2w/ Retinopathy See prior notes for background info - Today patient reportshe is doing very well overall. No new major concerns. No further significant hypoglycemia readings. Last A1c 6.9 (02/2020) - Trulicity 1.5mg  weekly injection - Metformin 1000mg  BID - OFF GLipizide Reports good compliance. Tolerating well w/o side-effects Currently on ACEi Lifestyle: Weight down 5 lbs, 6 months. - Diet (improve diet) - Exercise (Limited exercise due to time- gradually improving) - Dr last DM Eye exam 08/2019 Denieshypoglycemia,polyuria, visual changes, numbness or tingling.  CHRONIC HTN: Reportsno new concerns.Controlled without problem. Not checking BP regularly Current Meds -Lisinopril-HCTZ 20-12.5mg  - HALF pill daily Reports good compliance, took meds today. Tolerating well, w/o complaints. Admits can have episodic headache flare with BP elevated. Denies CP, dyspnea, HA, edema, dizziness / lightheadedness  Erectile Dysfunction Takes occasional dose generic Sildenafil x 2 = 40mg  PRN He has run out of med or nearly run out needs re order higher pill count Has improved since he has taken half BP pill  Additional concern  Skin Tag / Cyst on back Reports 2 areas on back that bother him time to time, can be sore or swell, now no problem  Health Maintenance:   Last cologuard 08/18/17 negative. Repeat due at next visit in 2022 for Annual Physical    Depression screen [facility] 2/9 05/29/2020 12/14/2019 11/26/2019  Decreased Interest 0 0 0  Down, Depressed, Hopeless 0 0 0  PHQ - 2 Score 0 0 0  Altered sleeping 0 - -  Tired, decreased energy 0 - -  Change in appetite 0 - -  Feeling bad or failure about yourself  0 - -  Trouble concentrating 0 - -  Moving slowly or  fidgety/restless 0 - -  Suicidal thoughts 0 - -  PHQ-9 Score 0 - -  Difficult doing work/chores Not difficult at all - -    Social History   Tobacco Use  . Smoking status: Current Some Day Smoker    Types: Cigarettes  . Smokeless tobacco: Current User  . Tobacco comment: pack of cigarettes last 1-2 months - doesnt inhale  Vaping Use  . Vaping Use: Never used  Substance Use Topics  . Alcohol use: Yes    Alcohol/week: 1.0 standard drink    Types: 1 Cans of beer per week    Comment: 1-2 beers week   . Drug use: No    Review of Systems Per HPI unless specifically indicated above     Objective:    BP 128/77   Pulse 67   Ht 5\' 11"  (1.803 m)   Wt 189 lb 12.8 oz (86.1 kg)   SpO2 100%   BMI 26.47 kg/m   Wt Readings from Last 3 Encounters:  05/29/20 189 lb 12.8 oz (86.1 kg)  12/14/19 194 lb (88 kg)  06/10/19 187 lb (84.8 kg)    Physical Exam Vitals and nursing note reviewed.  Constitutional:      General: He is not in acute distress.    Appearance: He is well-developed. He is not diaphoretic.     Comments: Well-appearing, comfortable, cooperative  HENT:     Head: Normocephalic and atraumatic.  Eyes:     General:        Right eye: No  discharge.        Left eye: No discharge.     Conjunctiva/sclera: Conjunctivae normal.  Cardiovascular:     Rate and Rhythm: Normal rate.  Pulmonary:     Effort: Pulmonary effort is normal.  Skin:    General: Skin is warm and dry.     Findings: No erythema or rash.  Neurological:     Mental Status: He is alert and oriented to person, place, and time.  Psychiatric:        Behavior: Behavior normal.     Comments: Well groomed, good eye contact, normal speech and thoughts      Recent Labs    12/06/19 0801 03/06/20 0852 05/29/20 1103  HGBA1C 6.6* 6.9* 6.7*     Results for orders placed or performed in visit on 05/29/20  POCT HgB A1C  Result Value Ref Range   Hemoglobin A1C 6.7 (A) 4.0 - 5.6 %      Assessment & Plan:    Problem List Items Addressed This Visit    Hyperlipidemia associated with type 2 diabetes mellitus (HCC)    Controlled cholesterol on statin and lifestyle Last lipid panel 11/2019 The 10-year ASCVD risk score Denman George DC Jr., et al., 2013) is: 48%  Plan: 1. Continue current meds - Pravastatin 10mg  daily 2. Encourage improved lifestyle - low carb/cholesterol, reduce portion size, continue improving regular exercise      Essential hypertension    Well-controlled HTN Not checking regularly No known complications    Plan:  1. HALF pill Lisinopril HCTZ 20-12.5mg  daily - Goal to limit meds improve ED - this has already improved on lower dose 2. Encourage improved lifestyle - low sodium diet, regular exercise 3. Continue monitor BP outside office, bring readings to next visit, if persistently >140/90 or new symptoms notify office sooner      Relevant Medications   sildenafil (REVATIO) 20 MG tablet   ED (erectile dysfunction) of organic origin    Likely multifactorial Worse with HTN DM and on meds  Improved on reduced BP medication now, half tab Refill Sildenafil taking 2 pills = 40mg  PRN with good results      Relevant Medications   sildenafil (REVATIO) 20 MG tablet   Controlled type 2 diabetes mellitus with retinopathy (HCC) - Primary    Well-controlled DM with A1c at 6.7, on lifestyle and GLP1 No hypoglycemia Complications - DM retinopathy hyperlipidemia- increases risk of future cardiovascular complications  - OFF Glipizide  Plan:  1. Continue current therapy - Trulicity 1.5mg  weekly, Metformin 1000mg  BID 2. Encourage improved lifestyle - low carb, low sugar diet, reduce portion size, continue improving regular exercise 3. Check CBG , bring log to next visit for review 4. Continue ACEi, Statin 5. UTD DM Eye [place]      Relevant Orders   POCT HgB A1C (Completed)      Meds ordered this encounter  Medications  . sildenafil (REVATIO) 20 MG tablet    Sig: Take 2-5  pills about 30 min prior to sex.    Dispense:  60 tablet    Refill:  5    Increased pill count.      Follow up plan: Return in about 6 months (around 11/29/2020) for 6 month fasting lab only 1 week later Annual Physical.  Future labs ordered for 11/27/20   , DO [facility] 05/29/2020, 10:51 AM

## 2020-05-29 NOTE — Assessment & Plan Note (Signed)
Likely multifactorial Worse with HTN DM and on meds  Improved on reduced BP medication now, half tab Refill Sildenafil taking 2 pills = 40mg  PRN with good results

## 2020-05-29 NOTE — Assessment & Plan Note (Signed)
Well-controlled HTN Not checking regularly No known complications    Plan:  1. HALF pill Lisinopril HCTZ 20-12.5mg  daily - Goal to limit meds improve ED - this has already improved on lower dose 2. Encourage improved lifestyle - low sodium diet, regular exercise 3. Continue monitor BP outside office, bring readings to next visit, if persistently >140/90 or new symptoms notify office sooner

## 2020-05-29 NOTE — Patient Instructions (Addendum)
Thank you for coming to the office today.  Recent Labs    12/06/19 0801 03/06/20 0852 05/29/20 1103  HGBA1C 6.6* 6.9* 6.7*     No problem on the cyst.  For the skin tag (top one) and deeper larger cyst (lower one) - they can be removed by surgeon if bothering you, please call this location if you want to ask them about cost and coverage with insurance or to get more information.  If you want me to go ahead and refer - let me know.  [facility] at [facility] Address: [address], [place], [place] [address] Phone: [phone]  Please schedule a Follow-up Appointment to: Return in about 6 months (around 11/29/2020) for 6 month fasting lab only 1 week later Annual Physical.  If you have any other questions or concerns, please feel free to call the office or send a message through MyChart. You may also schedule an earlier appointment if necessary.  Additionally, you may be receiving a survey about your experience at our office within a few days to 1 week by e-mail or mail. We value your feedback.  Saralyn Pilar, DO [facility], [place]

## 2020-05-29 NOTE — Assessment & Plan Note (Signed)
Well-controlled DM with A1c at 6.7, on lifestyle and GLP1 No hypoglycemia Complications - DM retinopathy hyperlipidemia- increases risk of future cardiovascular complications  - OFF Glipizide  Plan:  1. Continue current therapy - Trulicity 1.5mg  weekly, Metformin 1000mg  BID 2. Encourage improved lifestyle - low carb, low sugar diet, reduce portion size, continue improving regular exercise 3. Check CBG , bring log to next visit for review 4. Continue ACEi, Statin 5. UTD DM Eye 

## 2020-05-29 NOTE — Assessment & Plan Note (Signed)
Controlled cholesterol on statin and lifestyle Last lipid panel 11/2019 The 10-year ASCVD risk score Denman George DC Jr., et al., 2013) is: 48%  Plan: 1. Continue current meds - Pravastatin 10mg  daily 2. Encourage improved lifestyle - low carb/cholesterol, reduce portion size, continue improving regular exercise

## 2020-05-30 ENCOUNTER — Telehealth: Payer: Self-pay

## 2020-05-30 ENCOUNTER — Ambulatory Visit: Payer: Medicare HMO

## 2020-05-30 NOTE — Telephone Encounter (Signed)
This nurse called patient in order to perform scheduled telephonic AWV. Patient stated that he is currently at work and will need to reschedule for another time.

## 2020-06-06 ENCOUNTER — Ambulatory Visit: Payer: Medicare HMO

## 2020-06-20 ENCOUNTER — Other Ambulatory Visit: Payer: Self-pay | Admitting: Family Medicine

## 2020-06-20 DIAGNOSIS — I10 Essential (primary) hypertension: Secondary | ICD-10-CM

## 2020-06-22 ENCOUNTER — Telehealth: Payer: Self-pay

## 2020-06-22 DIAGNOSIS — I10 Essential (primary) hypertension: Secondary | ICD-10-CM

## 2020-06-22 MED ORDER — LISINOPRIL-HYDROCHLOROTHIAZIDE 20-12.5 MG PO TABS
0.5000 | ORAL_TABLET | Freq: Every day | ORAL | 3 refills | Status: DC
Start: 2020-06-22 — End: 2021-03-01

## 2020-06-22 NOTE — Telephone Encounter (Signed)
Pt. Is requesting a refill on lisinopril  BP medication he think this was the name of the medication called into  [facility] in [place]

## 2020-06-26 ENCOUNTER — Ambulatory Visit: Payer: Medicare HMO | Admitting: Family Medicine

## 2020-07-03 ENCOUNTER — Ambulatory Visit (INDEPENDENT_AMBULATORY_CARE_PROVIDER_SITE_OTHER): Payer: Medicare HMO | Admitting: Pharmacist

## 2020-07-03 DIAGNOSIS — I10 Essential (primary) hypertension: Secondary | ICD-10-CM

## 2020-07-03 DIAGNOSIS — E785 Hyperlipidemia, unspecified: Secondary | ICD-10-CM

## 2020-07-03 DIAGNOSIS — E113293 Type 2 diabetes mellitus with mild nonproliferative diabetic retinopathy without macular edema, bilateral: Secondary | ICD-10-CM | POA: Diagnosis not present

## 2020-07-03 DIAGNOSIS — E1169 Type 2 diabetes mellitus with other specified complication: Secondary | ICD-10-CM | POA: Diagnosis not present

## 2020-07-03 NOTE — Chronic Care Management (AMB) (Signed)
Chronic Care Management Pharmacy Note  07/03/2020 Name:  [name]. MRN:  [id number] DOB:  [birth year]  Subjective: Christopher Plitt. is an 75 y.o. year old male who is a primary patient of [name], DO.  The CCM team was consulted for assistance with disease management and care coordination needs.    Engaged with patient by telephone for follow up visit in response to provider referral for pharmacy case management and/or care coordination services.   Consent to Services:  The patient was given information about Chronic Care Management services, agreed to services, and gave verbal consent prior to initiation of services.  Please see initial visit note for detailed documentation.   Patient Care Team: [name], DO as PCP - General (Family Medicine) [place], Virl Diamond, [place] as Pharmacist Minor, Dalbert Garnet, RN (Inactive) as Case Manager Greg Cutter, LCSW as Social Worker (Licensed Clinical Social Worker)  Recent office visits: Office Visit with PCP on 5/9 for diabetes follow up   Hospital visits: None in previous 6 months  Objective:  Lab Results  Component Value Date   CREATININE 1.11 12/06/2019   CREATININE 1.03 12/03/2018   CREATININE 0.71 10/27/2018    Lab Results  Component Value Date   HGBA1C 6.7 (A) 05/29/2020   Last diabetic Eye exam:  Lab Results  Component Value Date/Time   HMDIABEYEEXA No Retinopathy 09/13/2019 12:00 AM    Last diabetic Foot exam: No results found for: HMDIABFOOTEX      Component Value Date/Time   CHOL 198 12/06/2019 0801   CHOL 204 (H) 05/10/2015 1609   TRIG 76 12/06/2019 0801   HDL 94 12/06/2019 0801   HDL 101 05/10/2015 1609   CHOLHDL 2.1 12/06/2019 0801   LDLCALC 87 12/06/2019 0801    Hepatic Function Latest Ref Rng & Units 12/06/2019 12/03/2018 10/27/2018  Total Protein 6.1 - 8.1 g/dL 6.6 6.7 5.6(L)  Albumin 3.5 - 5.0 g/dL - - 2.4(L)  AST 10 - 35 U/L _0 ALT 9 - 46 U/L _1 Alk Phosphatase 38 - 126 U/L - - 49  Total Bilirubin 0.2 - 1.2 mg/dL 0.8 0.8 0.6    Clinical ASCVD: No  The 10-year ASCVD risk score Mikey Bussing DC Jr., et al., 2013) is: 48%   Values used to calculate the score:     Age: 38 years     Sex: Male     Is Non-Hispanic African American: Yes     Diabetic: Yes     Tobacco smoker: Yes     Systolic Blood Pressure: 709 mmHg     Is BP treated: Yes     HDL Cholesterol: 94 mg/dL     Total Cholesterol: 198 mg/dL    Other: (CHADS2VASc if Afib, PHQ9 if depression, MMRC or CAT for COPD, ACT, DEXA)  Social History   Tobacco Use  Smoking Status Some Days   Pack years: 0.00   Types: Cigarettes  Smokeless Tobacco Current  Tobacco Comments   pack of cigarettes last 1-2 months - doesnt inhale   BP Readings from Last 3 Encounters:  05/29/20 128/77  12/14/19 124/70  08/13/19 (!) 149/78   Pulse Readings from Last 3 Encounters:  05/29/20 67  12/14/19 63  08/13/19 61   Wt Readings from Last 3 Encounters:  05/29/20 189 lb 12.8 oz (86.1 kg)  12/14/19 194 lb (88 kg)  06/10/19 187 lb (84.8 kg)    Assessment: Review of patient past medical  history, allergies, medications, health status, including review of consultants reports, laboratory and other test data, was performed as part of comprehensive evaluation and provision of chronic care management services.   SDOH:  (Social Determinants of Health) assessments and interventions performed: none   CCM Care Plan  Allergies  Allergen Reactions   2,4-D Dimethylamine (Amisol) Other (See Comments)   Tetanus Toxoid Nausea And Vomiting   Tetanus-Diphtheria Toxoids Td Other (See Comments)    Medications Reviewed Today     Reviewed by [name], RPH-CPP (Pharmacist) on 07/03/20 at 1430  Med List Status: <None>   Medication Order Taking? Sig Documenting Provider Last Dose Status Informant  ACCU-CHEK AVIVA PLUS test strip [id number]  Check sugar up to 2 x daily [name], DO   Active Pharmacy Records  Accu-Chek FastClix Lancets MISC [id number]  Check sugar up to 2 x daily [name], DO  Active Pharmacy Records  Accu-Chek Softclix Lancets lancets [id number]  Check sugar up to 2 x daily [name], DO  Active Pharmacy Records  acetaminophen (TYLENOL) 325 MG tablet [id number]  Take 2 tablets (650 mg total) by mouth every 6 (six) hours as needed for mild pain or headache (fever >/= 101).  Patient not taking: No sig reported   [name], MD  Active   adapalene (DIFFERIN) 0.1 % gel [id number]  Apply topically at bedtime. As needed [name], DO  Active   Blood Glucose Monitoring Suppl (ACCU-CHEK AVIVA PLUS) w/Device KIT [id number]  Use to check blood sugar up to x 2 daily [name], DO  Active Pharmacy Records  Dulaglutide (TRULICITY) 1.5 RF/1.6BW [facility] [id number] Yes Inject 1.5 mg into the skin once a week. [name], DO Taking Active Pharmacy Records           Med Note [facility]   Thu Nov 19, 2018  2:04 PM) On Mondays  lisinopril-hydrochlorothiazide (ZESTORETIC) 20-12.5 MG tablet [id number] Yes Take 0.5 tablets by mouth daily. [name], DO Taking Active   metFORMIN (GLUCOPHAGE) 1000 MG tablet [id number] Yes Take 1 tablet (1,000 mg total) by mouth 2 (two) times daily with a meal. Parks Ranger, [name], DO Taking Active   pravastatin (PRAVACHOL) 40 MG tablet [id number] Yes Take 1 tablet (40 mg total) by mouth daily. [name], DO Taking Active   Selenium Sulfide 2.25 % SHAM [id number]  APPLY EXTERNALLY TO THE AFFECTED AREA DAILY AS NEEDED FOR IRRITATION  Patient not taking: No sig reported   [name], DO  Active   sildenafil (REVATIO) 20 MG tablet [id number]  Take 2-5 pills about 30 min prior to sex. [name], DO  Active             Patient Active Problem List   Diagnosis Date Noted   Throat swelling 08/13/2019   External  hemorrhoid 10/14/2017   Diabetic retinopathy ([place]) 08/02/2016   Acne vulgaris 12/06/2015   Controlled type 2 diabetes mellitus with retinopathy ([place]) 07/19/2014   ED (erectile dysfunction) of organic origin 11/28/2011   Essential hypertension 11/28/2011   Hyperlipidemia associated with type 2 diabetes mellitus ([place]) 11/28/2011    Immunization History  Administered Date(s) Administered   Hepb-cpg 02/02/2020, 03/06/2020   Influenza, High Dose Seasonal PF 11/07/2016, 10/28/2017, 10/02/2018, 11/05/2019   Influenza-Unspecified 12/21/2013, 09/06/2015, 10/22/2015   PFIZER(Purple Top)SARS-COV-2 Vaccination 04/23/2019, 05/14/2019, 12/27/2019   PPD Test 01/24/2020   Pneumococcal Conjugate-13 12/08/2014   Pneumococcal Polysaccharide-23 05/06/2012   Tdap 02/04/2014  Conditions to be addressed/monitored: HTN, HLD, and DMII  Care Plan : PharmD - Med Mgmt  Updates made by [name], RPH-CPP since 07/03/2020 12:00 AM     Problem: Disease Progression      Long-Range Goal: Disease Progression Prevented or Minimized   Start Date: 03/13/2020  Expected End Date: 06/11/2020  This Visit's Progress: On track  Recent Progress: On track  Priority: High  Note:   Current Barriers:  Financial Barriers Patient APPROVED to received Trulicity through [place] patient assistance program through 01/20/2021 Knowledge deficits related to nutrition with diabetes  Pharmacist Clinical Goal(s):  Over the next 90 days, patient will maintain control of blood sugar as evidenced by A1C <7%  through collaboration with PharmD and provider.   Interventions: 1:1 collaboration with [name], DO regarding development and update of comprehensive plan of care as evidenced by provider attestation and co-signature Inter-disciplinary care team collaboration (see longitudinal plan of care) Perform chart review. Patient seen for Office Visit with PCP on 5/9 for diabetes follow  up  Diabetes: Controlled; current treatment: Metformin 1000 mg twice daily Trulicity 1.5 mg weekly on Mondays Current glucose readings ranging: 120-130s Have counseled on impact of diet and exercise on blood sugar Counseled on importance of having regular well-balanced meals and limiting carbohydrate portion sizes Encouraged patient to work towards an exercise goal of 30 minutes x 5 days/week Confirms comfort with testing improved now that he is using new lancet each time he checks his blood sugar   Hypertension: Current treatment: lisinopril/HCTZ 20-12.5 mg - 1/2 tablet daily Current home readings: recalls last checked this weekend: 125/80 Have discussed impact of salt/sodium on blood pressure and encouraged patient to review nutrition labels for sodium content Encourage patient to continue to monitor BP outside office, bring log to medical appointments and if persistently >140/90 or new symptoms notify office sooner   Medication Adherence Confirms back to using weekly pillbox as discussed to aid with medication adherence, particularly to twice daily metformin dosing Denies missed doses since restarted use Remind patient to review supply of medications and diabetes testing supplies and order from pharmacies in advance   Medication Assistance: Confirms received refill of Trulicity from patient assistance program   Patient Goals/Self-Care Activities Over the next 90 days, patient will:  Self administers all medications as prescribed Using weekly pillbox as adherence tool Check blood sugars and keep log of results Contact pharmacy to order refills of medication Call provider office for new concerns or questions.  Follow Up Plan: Telephone follow up appointment with care management team member scheduled for: 8/15 at 2:30 pm      Medication Assistance: APPROVED to received Trulicity through [place] patient assistance program through 01/20/2021  Patient's preferred pharmacy  is:  [id number] - [address], [address] [place] [address] Phone: [phone] Fax: [place], [address] [place] [address] Phone: [phone] Fax: [phone]  Uses pill box? Yes   Follow Up:  Patient agrees to Care Plan and Follow-up.  Harlow Asa, PharmD, Para March, CPP Clinical Pharmacist [facility] [phone]

## 2020-07-03 NOTE — Patient Instructions (Signed)
Visit Information  PATIENT GOALS:  Goals Addressed             This Visit's Progress    Pharmacy Goals       Our goal A1c is less than 7%. This corresponds with fasting sugars less than 130 and 2 hour after meal sugars less than 180. Please check your blood sugar and keep a record  Please check your home blood pressure, keep a log of the results and bring this with you to your medical appointments.  Our goal bad cholesterol, or LDL, is less than 70 . This is why it is important to continue taking your pravastatin  Please use your weekly pillbox to organize your medications  Feel free to call me with any questions or concerns. I look forward to our next call!   [name], PharmD, BCACP Clinical Pharmacist [facility] [phone]          The patient verbalized understanding of instructions, educational materials, and care plan provided today and declined offer to receive copy of patient instructions, educational materials, and care plan.   Telephone follow up appointment with care management team member scheduled for:8/15 at 2:30 pm

## 2020-07-11 ENCOUNTER — Ambulatory Visit (INDEPENDENT_AMBULATORY_CARE_PROVIDER_SITE_OTHER): Payer: Medicare HMO

## 2020-07-11 VITALS — Ht 71.5 in | Wt 190.0 lb

## 2020-07-11 DIAGNOSIS — Z0000 Encounter for general adult medical examination without abnormal findings: Secondary | ICD-10-CM | POA: Diagnosis not present

## 2020-07-11 NOTE — Progress Notes (Signed)
I connected with [name]. today by telephone and verified that I am speaking with the correct person using two identifiers. Location patient: home Location provider: work Persons participating in the virtual visit: [name]., Glenna Durand LPN.   I discussed the limitations, risks, security and privacy concerns of performing an evaluation and management service by telephone and the availability of in person appointments. I also discussed with the patient that there may be a patient responsible charge related to this service. The patient expressed understanding and verbally consented to this telephonic visit.    Interactive audio and video telecommunications were attempted between this provider and patient, however failed, due to patient having technical difficulties OR patient did not have access to video capability.  We continued and completed visit with audio only.     Vital signs may be patient reported or missing.  Subjective:   [name]. is a 75 y.o. male who presents for Medicare Annual/Subsequent preventive examination.  Review of Systems     Cardiac Risk Factors include: advanced age (>75mn, >>53women);diabetes mellitus;dyslipidemia;hypertension;male gender;sedentary lifestyle;smoking/ tobacco exposure     Objective:    Today's Vitals   07/11/20 1356  Weight: 190 lb (86.2 kg)  Height: 5' 11.5" (1.816 m)   Body mass index is 26.13 kg/m.  Advanced Directives 07/11/2020 10/22/2018 10/22/2018 08/05/2017 05/22/2016 12/08/2014  Does Patient Have a Medical Advance Directive? No No No No No No  Would patient like information on creating a medical advance directive? - No - Patient declined No - Patient declined Yes (MAU/Ambulatory/Procedural Areas - Information given) - No - patient declined information    Current Medications (verified) Outpatient Encounter Medications as of 07/11/2020  Medication Sig   ACCU-CHEK AVIVA PLUS test strip Check sugar up to 2 x  daily   Accu-Chek FastClix Lancets MISC Check sugar up to 2 x daily   Accu-Chek Softclix Lancets lancets Check sugar up to 2 x daily   acetaminophen (TYLENOL) 325 MG tablet Take 2 tablets (650 mg total) by mouth every 6 (six) hours as needed for mild pain or headache (fever >/= 101).   Blood Glucose Monitoring Suppl (ACCU-CHEK AVIVA PLUS) w/Device KIT Use to check blood sugar up to x 2 daily   Dulaglutide (TRULICITY) 1.5 MDJ/4.9FWSOPN Inject 1.5 mg into the skin once a week.   lisinopril-hydrochlorothiazide (ZESTORETIC) 20-12.5 MG tablet Take 0.5 tablets by mouth daily.   metFORMIN (GLUCOPHAGE) 1000 MG tablet Take 1 tablet (1,000 mg total) by mouth 2 (two) times daily with a meal.   pravastatin (PRAVACHOL) 40 MG tablet Take 1 tablet (40 mg total) by mouth daily.   sildenafil (REVATIO) 20 MG tablet Take 2-5 pills about 30 min prior to sex.   adapalene (DIFFERIN) 0.1 % gel Apply topically at bedtime. As needed (Patient not taking: Reported on 07/11/2020)   Selenium Sulfide 2.25 % SHAM APPLY EXTERNALLY TO THE AFFECTED AREA DAILY AS NEEDED FOR IRRITATION (Patient not taking: No sig reported)   No facility-administered encounter medications on file as of 07/11/2020.    Allergies (verified) 2,4-d dimethylamine (amisol); Tetanus toxoid; and Tetanus-diphtheria toxoids td   History: Past Medical History:  Diagnosis Date   Erectile dysfunction    Hypertension    Right-sided chest pain    Past Surgical History:  Procedure Laterality Date   ROTATOR CUFF REPAIR     Family History  Problem Relation Age of Onset   Cancer Brother        Throat  Social History   Socioeconomic History   Marital status: Divorced    Spouse name: Not on file   Number of children: Not on file   Years of education: Not on file   Highest education level: Not on file  Occupational History   Not on file  Tobacco Use   Smoking status: Some Days    Pack years: 0.00    Types: Cigarettes   Smokeless tobacco:  Current   Tobacco comments:    pack of cigarettes last 1-2 months - doesnt inhale  Vaping Use   Vaping Use: Never used  Substance and Sexual Activity   Alcohol use: Yes    Alcohol/week: 1.0 standard drink    Types: 1 Cans of beer per week    Comment: 1-2 beers week    Drug use: No   Sexual activity: Not on file  Other Topics Concern   Not on file  Social History Narrative   Working 1-2 days a week .    Social Determinants of Health   Financial Resource Strain: Low Risk    Difficulty of Paying Living Expenses: Not hard at all  Food Insecurity: No Food Insecurity   Worried About Charity fundraiser in the Last Year: Never true   [place] in the Last Year: Never true  Transportation Needs: No Transportation Needs   Lack of Transportation (Medical): No   Lack of Transportation (Non-Medical): No  Physical Activity: Inactive   Days of Exercise per Week: 0 days   Minutes of Exercise per Session: 0 min  Stress: No Stress Concern Present   Feeling of Stress : Only a little  Social Connections: Not on file    Tobacco Counseling Ready to quit: Yes Counseling given: Not Answered Tobacco comments: pack of cigarettes last 1-2 months - doesnt inhale   Clinical Intake:  Pre-visit preparation completed: Yes  Pain : No/denies pain     Nutritional Status: BMI 25 -29 Overweight Nutritional Risks: None Diabetes: Yes  How often do you need to have someone help you when you read instructions, pamphlets, or other written materials from your doctor or pharmacy?: 1 - Never What is the last grade level you completed in school?: 12th grade  Diabetic? Yes Nutrition Risk Assessment:  Has the patient had any N/V/D within the last 2 months?  No  Does the patient have any non-healing wounds?  No  Has the patient had any unintentional weight loss or weight gain?  No   Diabetes:  Is the patient diabetic?  Yes  If diabetic, was a CBG obtained today?  No  Did the patient bring in  their glucometer from home?  No  How often do you monitor your CBG's? 2 weekly.   Financial Strains and Diabetes Management:  Are you having any financial strains with the device, your supplies or your medication? No .  Does the patient want to be seen by Chronic Care Management for management of their diabetes?  No  Would the patient like to be referred to a Nutritionist or for Diabetic Management?  No   Diabetic Exams:  Diabetic Eye Exam: Completed 12/06/2019 Diabetic Foot Exam: Completed 12/14/2019  Interpreter Needed?: No  Information entered by :: NAllen LPN   Activities of Daily Living In your present state of health, do you have any difficulty performing the following activities: 07/11/2020 11/26/2019  Hearing? N N  Vision? N N  Difficulty concentrating or making decisions? N N  Walking or climbing  stairs? N N  Dressing or bathing? N N  Doing errands, shopping? N N  Preparing Food and eating ? N -  Using the Toilet? N -  In the past six months, have you accidently leaked urine? N -  Do you have problems with loss of bowel control? N -  Managing your Medications? N -  Managing your Finances? N -  Housekeeping or managing your Housekeeping? N -  Some recent data might be hidden    Patient Care Team: [name], DO as PCP - General (Family Medicine) [place], Virl Diamond, RPH-CPP as Pharmacist Minor, Dalbert Garnet, RN (Inactive) as Case Manager Greg Cutter, LCSW as Social Worker (Licensed Clinical Social Worker)  Indicate any recent [employer] you may have received from other than [facility] providers in the past year (date may be approximate).     Assessment:   This is a routine wellness examination for Jadan.  Hearing/Vision screen Vision Screening - Comments:: Regular eye exams, Dr. [name]  Dietary issues and exercise activities discussed: Current Exercise Habits: The patient does not participate in regular exercise at present   Goals Addressed              This Visit's Progress    Patient Stated       07/11/2020, wants to save $100,000 to retire        Depression Screen PHQ 2/9 Scores 07/11/2020 05/29/2020 12/14/2019 11/26/2019 06/10/2019 12/10/2018 09/02/2018  PHQ - 2 Score 0 0 0 0 0 0 0  PHQ- 9 Score - 0 - - - - -    Fall Risk Fall Risk  07/11/2020 12/14/2019 11/26/2019 06/10/2019 12/10/2018  Falls in the past year? 0 0 0 0 1  Number falls in past yr: - 0 0 0 0  Injury with Fall? - 0 0 0 0  Risk for fall due to : Medication side effect - - - -  Risk for fall due to: Comment - - - - -  Follow up Falls evaluation completed;Education provided;Falls prevention discussed Falls evaluation completed Falls evaluation completed Falls evaluation completed Falls evaluation completed    FALL RISK PREVENTION PERTAINING TO THE HOME:  Any stairs in or around the home? Yes  If so, are there any without handrails? No  Home free of loose throw rugs in walkways, pet beds, electrical cords, etc? Yes  Adequate lighting in your home to reduce risk of falls? Yes   ASSISTIVE DEVICES UTILIZED TO PREVENT FALLS:  Life alert? No  Use of a cane, walker or w/c? No  Grab bars in the bathroom? No  Shower chair or bench in shower? No  Elevated toilet seat or a handicapped toilet? Yes   TIMED UP AND GO:  Was the test performed? No .      Cognitive Function:     6CIT Screen 07/11/2020 08/05/2017 05/22/2016  What Year? 0 points 0 points 0 points  What month? 0 points 0 points 0 points  What time? 0 points 0 points 0 points  Count back from 20 0 points 0 points 0 points  Months in reverse 0 points 0 points 0 points  Repeat phrase 2 points 0 points 4 points  Total Score 2 0 4    Immunizations Immunization History  Administered Date(s) Administered   Hepb-cpg 02/02/2020, 03/06/2020   Influenza, High Dose Seasonal PF 11/07/2016, 10/28/2017, 10/02/2018, 11/05/2019   Influenza-Unspecified 12/21/2013, 09/06/2015, 10/22/2015   PFIZER(Purple  Top)SARS-COV-2 Vaccination 04/23/2019, 05/14/2019, 12/27/2019   PPD Test  01/24/2020   Pneumococcal Conjugate-13 12/08/2014   Pneumococcal Polysaccharide-23 05/06/2012   Tdap 02/04/2014    TDAP status: Up to date  Flu Vaccine status: Up to date  Pneumococcal vaccine status: Up to date  Covid-19 vaccine status: Completed vaccines  Qualifies for Shingles Vaccine? Yes   Zostavax completed No   Shingrix Completed?: No.    Education has been provided regarding the importance of this vaccine. Patient has been advised to call insurance company to determine out of pocket expense if they have not yet received this vaccine. Advised may also receive vaccine at local pharmacy or Health Dept. Verbalized acceptance and understanding.  Screening Tests Health Maintenance  Topic Date Due   Zoster Vaccines- Shingrix (1 of 2) Never done   COVID-19 Vaccine (4 - Booster for Pfizer series) 03/26/2020   Fecal DNA (Cologuard)  08/18/2020   INFLUENZA VACCINE  08/21/2020   OPHTHALMOLOGY EXAM  09/12/2020   HEMOGLOBIN A1C  11/29/2020   FOOT EXAM  12/13/2020   TETANUS/TDAP  02/05/2024   Hepatitis C Screening  Completed   PNA vac Low Risk Adult  Completed   HPV VACCINES  Aged Out    Health Maintenance  Health Maintenance Due  Topic Date Due   Zoster Vaccines- Shingrix (1 of 2) Never done   COVID-19 Vaccine (4 - Booster for Pfizer series) 03/26/2020    Colorectal cancer screening: Type of screening: Cologuard. Completed 08/18/2017. Repeat every 3 years  Lung Cancer Screening: (Low Dose CT Chest recommended if Age 76-80 years, 30 pack-year currently smoking OR have quit w/in 15years.) does not qualify.   Lung Cancer Screening Referral: no  Additional Screening:  Hepatitis C Screening: does qualify; Completed 10/31/2015  Vision Screening: Recommended annual ophthalmology exams for early detection of glaucoma and other disorders of the eye. Is the patient up to date with their annual eye exam?  Yes   Who is the provider or what is the name of the office in which the patient attends annual eye exams? Dr. [name] If pt is not established with a provider, would they like to be referred to a provider to establish care? No .   Dental Screening: Recommended annual dental exams for proper oral hygiene  Community Resource Referral / Chronic Care Management: CRR required this visit?  No   CCM required this visit?  No      Plan:     I have personally reviewed and noted the following in the patient's chart:   Medical and social history Use of alcohol, tobacco or illicit drugs  Current medications and supplements including opioid prescriptions. Patient is not currently taking opioid prescriptions. Functional ability and status Nutritional status Physical activity Advanced directives List of other physicians Hospitalizations, surgeries, and ER visits in previous 12 months Vitals Screenings to include cognitive, depression, and falls Referrals and appointments  In addition, I have reviewed and discussed with patient certain preventive protocols, quality metrics, and best practice recommendations. A written personalized care plan for preventive services as well as general preventive health recommendations were provided to patient.     Kellie Simmering, LPN   [phone]   Nurse Notes:

## 2020-07-11 NOTE — Patient Instructions (Signed)
[name] , Thank you for taking time to come for your Medicare Wellness Visit. I appreciate your ongoing commitment to your health goals. Please review the following plan we discussed and let me know if I can assist you in the future.   Screening recommendations/referrals: Colonoscopy: cologuard 08/18/2017, due 08/18/2020 Recommended yearly ophthalmology/optometry visit for glaucoma screening and checkup Recommended yearly dental visit for hygiene and checkup  Vaccinations: Influenza vaccine: completed 11/05/2019, due 08/21/2020 Pneumococcal vaccine: completed 12/08/2014 Tdap vaccine: completed 02/04/2014 Shingles vaccine: discussed   Covid-19:  12/27/2019, 05/14/2019, 04/23/2019  Advanced directives: Advance directive discussed with you today.   Conditions/risks identified: smoking  Next appointment: Follow up in one year for your annual wellness visit.   Preventive Care 74 Years and Older, Male Preventive care refers to lifestyle choices and visits with your health care provider that can promote health and wellness. What does preventive care include? A yearly physical exam. This is also called an annual well check. Dental exams once or twice a year. Routine eye exams. Ask your health care provider how often you should have your eyes checked. Personal lifestyle choices, including: Daily care of your teeth and gums. Regular physical activity. Eating a healthy diet. Avoiding tobacco and drug use. Limiting alcohol use. Practicing safe sex. Taking low doses of aspirin every day. Taking vitamin and mineral supplements as recommended by your health care provider. What happens during an annual well check? The services and screenings done by your health care provider during your annual well check will depend on your age, overall health, lifestyle risk factors, and family history of disease. Counseling  Your health care provider may ask you questions about your: Alcohol use. Tobacco  use. Drug use. Emotional well-being. Home and relationship well-being. Sexual activity. Eating habits. History of falls. Memory and ability to understand (cognition). Work and work Astronomer. Screening  You may have the following tests or measurements: Height, weight, and BMI. Blood pressure. Lipid and cholesterol levels. These may be checked every 5 years, or more frequently if you are over 21 years old. Skin check. Lung cancer screening. You may have this screening every year starting at age 744 if you have a 30-pack-year history of smoking and currently smoke or have quit within the past 15 years. Fecal occult blood test (FOBT) of the stool. You may have this test every year starting at age 740. Flexible sigmoidoscopy or colonoscopy. You may have a sigmoidoscopy every 5 years or a colonoscopy every 10 years starting at age 74. Prostate cancer screening. Recommendations will vary depending on your family history and other risks. Hepatitis C blood test. Hepatitis B blood test. Sexually transmitted disease (STD) testing. Diabetes screening. This is done by checking your blood sugar (glucose) after you have not eaten for a while (fasting). You may have this done every 1-3 years. Abdominal aortic aneurysm (AAA) screening. You may need this if you are a current or former smoker. Osteoporosis. You may be screened starting at age 744 if you are at high risk. Talk with your health care provider about your test results, treatment options, and if necessary, the need for more tests. Vaccines  Your health care provider may recommend certain vaccines, such as: Influenza vaccine. This is recommended every year. Tetanus, diphtheria, and acellular pertussis (Tdap, Td) vaccine. You may need a Td booster every 10 years. Zoster vaccine. You may need this after age 74. Pneumococcal 13-valent conjugate (PCV13) vaccine. One dose is recommended after age 74. Pneumococcal polysaccharide (PPSV23) vaccine.  One  dose is recommended after age 74. Talk to your health care provider about which screenings and vaccines you need and how often you need them. This information is not intended to replace advice given to you by your health care provider. Make sure you discuss any questions you have with your health care provider. Document Released: 02/03/2015 Document Revised: 09/27/2015 Document Reviewed: 11/08/2014 Elsevier Interactive Patient Education  2017 [place] Prevention in the Home Falls can cause injuries. They can happen to people of all ages. There are many things you can do to make your home safe and to help prevent falls. What can I do on the outside of my home? Regularly fix the edges of walkways and driveways and fix any cracks. Remove anything that might make you trip as you walk through a door, such as a raised step or threshold. Trim any bushes or trees on the path to your home. Use bright outdoor lighting. Clear any walking paths of anything that might make someone trip, such as rocks or tools. Regularly check to see if handrails are loose or broken. Make sure that both sides of any steps have handrails. Any raised decks and porches should have guardrails on the edges. Have any leaves, snow, or ice cleared regularly. Use sand or salt on walking paths during winter. Clean up any spills in your garage right away. This includes oil or grease spills. What can I do in the bathroom? Use night lights. Install grab bars by the toilet and in the tub and shower. Do not use towel bars as grab bars. Use non-skid mats or decals in the tub or shower. If you need to sit down in the shower, use a plastic, non-slip stool. Keep the floor dry. Clean up any water that spills on the floor as soon as it happens. Remove soap buildup in the tub or shower regularly. Attach bath mats securely with double-sided non-slip rug tape. Do not have throw rugs and other things on the floor that can make  you trip. What can I do in the bedroom? Use night lights. Make sure that you have a light by your bed that is easy to reach. Do not use any sheets or blankets that are too big for your bed. They should not hang down onto the floor. Have a firm chair that has side arms. You can use this for support while you get dressed. Do not have throw rugs and other things on the floor that can make you trip. What can I do in the kitchen? Clean up any spills right away. Avoid walking on wet floors. Keep items that you use a lot in easy-to-reach places. If you need to reach something above you, use a strong step stool that has a grab bar. Keep electrical cords out of the way. Do not use floor polish or wax that makes floors slippery. If you must use wax, use non-skid floor wax. Do not have throw rugs and other things on the floor that can make you trip. What can I do with my stairs? Do not leave any items on the stairs. Make sure that there are handrails on both sides of the stairs and use them. Fix handrails that are broken or loose. Make sure that handrails are as long as the stairways. Check any carpeting to make sure that it is firmly attached to the stairs. Fix any carpet that is loose or worn. Avoid having throw rugs at the top or bottom of the stairs.  If you do have throw rugs, attach them to the floor with carpet tape. Make sure that you have a light switch at the top of the stairs and the bottom of the stairs. If you do not have them, ask someone to add them for you. What else can I do to help prevent falls? Wear shoes that: Do not have high heels. Have rubber bottoms. Are comfortable and fit you well. Are closed at the toe. Do not wear sandals. If you use a stepladder: Make sure that it is fully opened. Do not climb a closed stepladder. Make sure that both sides of the stepladder are locked into place. Ask someone to hold it for you, if possible. Clearly mark and make sure that you can  see: Any grab bars or handrails. First and last steps. Where the edge of each step is. Use tools that help you move around (mobility aids) if they are needed. These include: Canes. Walkers. Scooters. Crutches. Turn on the lights when you go into a dark area. Replace any light bulbs as soon as they burn out. Set up your furniture so you have a clear path. Avoid moving your furniture around. If any of your floors are uneven, fix them. If there are any pets around you, be aware of where they are. Review your medicines with your doctor. Some medicines can make you feel dizzy. This can increase your chance of falling. Ask your doctor what other things that you can do to help prevent falls. This information is not intended to replace advice given to you by your health care provider. Make sure you discuss any questions you have with your health care provider. Document Released: 11/03/2008 Document Revised: 06/15/2015 Document Reviewed: 02/11/2014 Elsevier Interactive Patient Education  2017 [employer].

## 2020-08-20 ENCOUNTER — Other Ambulatory Visit: Payer: Self-pay | Admitting: Family Medicine

## 2020-08-20 DIAGNOSIS — E1169 Type 2 diabetes mellitus with other specified complication: Secondary | ICD-10-CM

## 2020-08-20 DIAGNOSIS — E785 Hyperlipidemia, unspecified: Secondary | ICD-10-CM

## 2020-08-20 DIAGNOSIS — E113293 Type 2 diabetes mellitus with mild nonproliferative diabetic retinopathy without macular edema, bilateral: Secondary | ICD-10-CM

## 2020-08-20 NOTE — Telephone Encounter (Signed)
Requested Prescriptions  Pending Prescriptions Disp Refills  . metFORMIN (GLUCOPHAGE) 1000 MG tablet [Pharmacy Med Name: METFORMIN 1000MG TABLETS] 180 tablet 3    Sig: TAKE 1 TABLET(1000 MG) BY MOUTH TWICE DAILY WITH A MEAL     Endocrinology:  Diabetes - Biguanides Passed - 08/20/2020  3:04 PM      Passed - Cr in normal range and within 360 days    Creat  Date Value Ref Range Status  12/06/2019 1.11 0.70 - 1.18 mg/dL Final    Comment:    For patients >28 years of age, the reference limit for Creatinine is approximately 13% higher for people identified as African-American. .    Creatinine, POC  Date Value Ref Range Status  04/05/2015 0 mg/dL Final         Passed - HBA1C is between 0 and 7.9 and within 180 days    Hemoglobin A1C  Date Value Ref Range Status  05/29/2020 6.7 (A) 4.0 - 5.6 % Final   Hgb A1c MFr Bld  Date Value Ref Range Status  12/06/2019 6.6 (H) <5.7 % of total Hgb Final    Comment:    For someone without known diabetes, a hemoglobin A1c value of 6.5% or greater indicates that they may have  diabetes and this should be confirmed with a follow-up  test. . For someone with known diabetes, a value <7% indicates  that their diabetes is well controlled and a value  greater than or equal to 7% indicates suboptimal  control. A1c targets should be individualized based on  duration of diabetes, age, comorbid conditions, and  other considerations. . Currently, no consensus exists regarding use of hemoglobin A1c for diagnosis of diabetes for children. .          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 pravastatin (PRAVACHOL) 10 MG tablet [Pharmacy Med Name: PRAVASTATIN 10MG TABLETS] 90 tablet     Sig: TAKE 1 TABLET BY MOUTH DAILY     Cardiovascular:  Antilipid -  Statins Passed - 08/20/2020  3:04 PM      Passed - Total Cholesterol in normal range and within 360 days    Cholesterol, Total  Date Value Ref Range Status  05/10/2015 204 (H) 100 - 199 mg/dL Final   Cholesterol  Date Value Ref Range Status  12/06/2019 198 <200 mg/dL Final         Passed - LDL in normal range and within 360 days    LDL Cholesterol (Calc)  Date Value Ref Range Status  12/06/2019 87 mg/dL (calc) Final    Comment:    Reference range: <100 . Desirable range <100 mg/dL for primary prevention;   <70 mg/dL for patients with CHD or diabetic patients  with > or = 2 CHD risk factors. . LDL-C is now calculated using the Martin-Hopkins  calculation, which is a validated novel method providing  better accuracy than the Friedewald equation in the  estimation of LDL-C.  Martin SS et al. JAMA. 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, without long-term current use of insulin (HCC)   [facility] [name], Alexander J, DO   8 months ago Annual physical exam   [facility] [name], Alexander J, DO   8 months ago Sore throat   [facility] [name], Alexander J, DO   1 year ago Throat swelling   [facility] Malfi, Nicole M, FNP   1 year ago Controlled type 2 diabetes mellitus with both eyes affected 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             

## 2020-09-04 ENCOUNTER — Ambulatory Visit (INDEPENDENT_AMBULATORY_CARE_PROVIDER_SITE_OTHER): Payer: Medicare HMO | Admitting: Pharmacist

## 2020-09-04 DIAGNOSIS — E113293 Type 2 diabetes mellitus with mild nonproliferative diabetic retinopathy without macular edema, bilateral: Secondary | ICD-10-CM | POA: Diagnosis not present

## 2020-09-04 DIAGNOSIS — E785 Hyperlipidemia, unspecified: Secondary | ICD-10-CM | POA: Diagnosis not present

## 2020-09-04 DIAGNOSIS — I10 Essential (primary) hypertension: Secondary | ICD-10-CM

## 2020-09-04 DIAGNOSIS — E1169 Type 2 diabetes mellitus with other specified complication: Secondary | ICD-10-CM

## 2020-09-04 NOTE — Chronic Care Management (AMB) (Signed)
Chronic Care Management Pharmacy Note  09/04/2020 Name:  [name]. MRN:  [id number] DOB:  05/18/1945   Subjective: [name]. is an 75 y.o. year old male who is a primary patient of Christopher Hauser, DO.  The CCM team was consulted for assistance with disease management and care coordination needs.    Engaged with patient by telephone for follow up visit in response to provider referral for pharmacy case management and/or care coordination services.   Consent to Services:  The patient was given information about Chronic Care Management services, agreed to services, and gave verbal consent prior to initiation of services.  Please see initial visit note for detailed documentation.   Patient Care Team: Christopher Hauser, DO as PCP - General (Family Medicine) Curley Spice, Virl Diamond, RPH-CPP as Pharmacist [place], Dalbert Garnet, RN (Inactive) as Case Manager Greg Cutter, LCSW as Social Worker (Licensed Holiday representative)  Recent office visits: None  Hospital visits: None in previous 6 months  Objective:  Lab Results  Component Value Date   CREATININE 1.11 12/06/2019   CREATININE 1.03 12/03/2018   CREATININE 0.71 10/27/2018    Lab Results  Component Value Date   HGBA1C 6.7 (A) 05/29/2020   Last diabetic Eye exam:  Lab Results  Component Value Date/Time   HMDIABEYEEXA No Retinopathy 09/13/2019 12:00 AM    Last diabetic Foot exam: No results found for: HMDIABFOOTEX      Component Value Date/Time   CHOL 198 12/06/2019 0801   CHOL 204 (H) 05/10/2015 1609   TRIG 76 12/06/2019 0801   HDL 94 12/06/2019 0801   HDL 101 05/10/2015 1609   CHOLHDL 2.1 12/06/2019 0801   LDLCALC 87 12/06/2019 0801    Hepatic Function Latest Ref Rng & Units 12/06/2019 12/03/2018 10/27/2018  Total Protein 6.1 - 8.1 g/dL 6.6 6.7 5.6(L)  Albumin 3.5 - 5.0 g/dL - - 2.4(L)  AST 10 - 35 U/L _0 ALT 9 - 46 U/L _1 Alk Phosphatase 38 - 126 U/L - - 49  Total  Bilirubin 0.2 - 1.2 mg/dL 0.8 0.8 0.6    Social History   Tobacco Use  Smoking Status Some Days   Types: Cigarettes  Smokeless Tobacco Current  Tobacco Comments   pack of cigarettes last 1-2 months - doesnt inhale   BP Readings from Last 3 Encounters:  05/29/20 128/77  12/14/19 124/70  08/13/19 (!) 149/78   Pulse Readings from Last 3 Encounters:  05/29/20 67  12/14/19 63  08/13/19 61   Wt Readings from Last 3 Encounters:  07/11/20 190 lb (86.2 kg)  05/29/20 189 lb 12.8 oz (86.1 kg)  12/14/19 194 lb (88 kg)    Assessment: Review of patient past medical history, allergies, medications, health status, including review of consultants reports, laboratory and other test data, was performed as part of comprehensive evaluation and provision of chronic care management services.   SDOH:  (Social Determinants of Health) assessments and interventions performed: none   CCM Care Plan  Allergies  Allergen Reactions   2,4-D Dimethylamine (Amisol) Other (See Comments)   Tetanus Toxoid Nausea And Vomiting   Tetanus-Diphtheria Toxoids Td Other (See Comments)    Medications Reviewed Today     Reviewed by Rennis Petty, RPH-CPP (Pharmacist) on 09/04/20 at 1438  Med List Status: <None>   Medication Order Taking? Sig Documenting Provider Last Dose Status Informant  ACCU-CHEK AVIVA PLUS test strip [id number]  Check sugar up to  2 x daily Christopher Hauser, DO  Active Pharmacy Records  Accu-Chek [place] Lancets MISC [id number]  Check sugar up to 2 x daily Christopher Hauser, DO  Active Pharmacy Records  Accu-Chek Softclix Lancets lancets [id number]  Check sugar up to 2 x daily Christopher Hauser, DO  Active Pharmacy Records  acetaminophen (TYLENOL) 325 MG tablet [id number]  Take 2 tablets (650 mg total) by mouth every 6 (six) hours as needed for mild pain or headache (fever >/= 101). [name], MD  Active   adapalene (DIFFERIN) 0.1 % gel [id number]  Apply  topically at bedtime. As needed  Patient not taking: Reported on 07/11/2020   Christopher Hauser, DO  Active   Blood Glucose Monitoring Suppl (ACCU-CHEK AVIVA PLUS) w/Device KIT [id number]  Use to check blood sugar up to x 2 daily Christopher Hauser, DO  Active Pharmacy Records  Dulaglutide (TRULICITY) 1.5 KG/4.0NU [facility] [id number] Yes Inject 1.5 mg into the skin once a week. Christopher Hauser, DO Taking Active Pharmacy Records           Med Note [facility] A   Thu Nov 19, 2018  2:04 PM) On Mondays  lisinopril-hydrochlorothiazide (ZESTORETIC) 20-12.5 MG tablet [id number] Yes Take 0.5 tablets by mouth daily. Christopher Hauser, DO Taking Active   metFORMIN (GLUCOPHAGE) 1000 MG tablet [id number] Yes TAKE 1 TABLET(1000 MG) BY MOUTH TWICE DAILY WITH A MEAL [name], [name], DO Taking Active   pravastatin (PRAVACHOL) 40 MG tablet [id number] Yes Take 1 tablet (40 mg total) by mouth daily. Christopher Hauser, DO Taking Active   Selenium Sulfide 2.25 % SHAM [id number]  APPLY EXTERNALLY TO THE AFFECTED AREA DAILY AS NEEDED FOR IRRITATION  Patient not taking: No sig reported   Christopher Hauser, DO  Active   sildenafil (REVATIO) 20 MG tablet [id number]  Take 2-5 pills about 30 min prior to sex. Christopher Hauser, DO  Active             Patient Active Problem List   Diagnosis Date Noted   Throat swelling 08/13/2019   External hemorrhoid 10/14/2017   Diabetic retinopathy ([place]) 08/02/2016   Acne vulgaris 12/06/2015   Controlled type 2 diabetes mellitus with retinopathy ([place]) 07/19/2014   ED (erectile dysfunction) of organic origin 11/28/2011   Essential hypertension 11/28/2011   Hyperlipidemia associated with type 2 diabetes mellitus ([place]) 11/28/2011    Immunization History  Administered Date(s) Administered   Hepb-cpg 02/02/2020, 03/06/2020   Influenza, High Dose Seasonal PF 11/07/2016, 10/28/2017, 10/02/2018, 11/05/2019    Influenza-Unspecified 12/21/2013, 09/06/2015, 10/22/2015   PFIZER(Purple Top)SARS-COV-2 Vaccination 04/23/2019, 05/14/2019, 12/27/2019   PPD Test 01/24/2020   Pneumococcal Conjugate-13 12/08/2014   Pneumococcal Polysaccharide-23 05/06/2012   Tdap 02/04/2014    Conditions to be addressed/monitored: HTN, HLD, and DMII  Care Plan : PharmD - Med Mgmt  Updates made by Rennis Petty, RPH-CPP since 09/04/2020 12:00 AM     Problem: Disease Progression      Long-Range Goal: Disease Progression Prevented or Minimized   Start Date: 03/13/2020  Expected End Date: 06/11/2020  This Visit's Progress: On track  Recent Progress: On track  Priority: High  Note:   Current Barriers:  Financial Barriers Patient APPROVED to received Trulicity through [place] patient assistance program through 01/20/2021 Knowledge deficits related to nutrition with diabetes  Pharmacist Clinical Goal(s):  Over the next 90 days, patient will maintain control of blood sugar as evidenced by A1C <7%  through collaboration with PharmD and  provider.   Interventions: 1:1 collaboration with Christopher Hauser, DO regarding development and update of comprehensive plan of care as evidenced by provider attestation and co-signature Inter-disciplinary care team collaboration (see longitudinal plan of care)  Diabetes: Controlled; current treatment: Metformin 1000 mg twice daily Trulicity 1.5 mg weekly on Mondays Current glucose readings ranging: 90-120s Have counseled on impact of diet and exercise on blood sugar Counseled on importance of having regular well-balanced meals and limiting carbohydrate portion sizes Have encouraged patient to work towards an exercise goal of 30 minutes x 5 days/week   Hypertension: Current treatment: lisinopril/HCTZ 20-12.5 mg - 1/2 tablet daily Reports last checked home BP: last week: 137/75, HR 69  Counsel on impact of salt/sodium on blood pressure and encouraged patient to review  nutrition labels for sodium content Encourage patient to continue to monitor BP outside office, bring log to medical appointments and if persistently >140/90 or new symptoms notify office sooner   Medication Adherence Denies using weekly pillbox recently - reports recent change in driving schedule Encourage patient to restart using weekly pillbox to aid with medication adherence    Patient Goals/Self-Care Activities Over the next 90 days, patient will:  Self administers all medications as prescribed Using weekly pillbox as adherence tool Check blood sugars and keep log of results Contact pharmacy to order refills of medication Call provider office for new concerns or questions.  Follow Up Plan: Telephone follow up appointment with care management team member scheduled for: 12/20/2020 at 2:30 PM      Medication Assistance: APPROVED to received Trulicity through [place] patient assistance program through 01/20/2021  Patient's preferred pharmacy is:  [id number] - [address] [place] [address] Phone: [phone] Fax: [phone]  [place] [id number], [place] [address] Phone: [phone] Fax: [phone]   Follow Up:  Patient agrees to Care Plan and Follow-up.  Wallace Cullens, PharmD, Para [place], CPP Clinical Pharmacist [facility] [phone]

## 2020-09-04 NOTE — Patient Instructions (Signed)
Visit Information  PATIENT GOALS:  Goals Addressed             This Visit's Progress    Pharmacy Goals       Our goal A1c is less than 7%. This corresponds with fasting sugars less than 130 and 2 hour after meal sugars less than 180. Please check your blood sugar and keep a record  Please check your home blood pressure, keep a log of the results and bring this with you to your medical appointments.  Our goal bad cholesterol, or LDL, is less than 70 . This is why it is important to continue taking your pravastatin  Please use your weekly pillbox to organize your medications  Feel free to call me with any questions or concerns. I look forward to our next call!   Estelle Grumbles, PharmD, BCACP Clinical Pharmacist [facility] [phone]         The patient verbalized understanding of instructions, educational materials, and care plan provided today and declined offer to receive copy of patient instructions, educational materials, and care plan.   Telephone follow up appointment with care management team member scheduled for: 12/20/2020 at 2:30 PM

## 2020-11-15 DIAGNOSIS — E119 Type 2 diabetes mellitus without complications: Secondary | ICD-10-CM | POA: Diagnosis not present

## 2020-11-15 DIAGNOSIS — H35372 Puckering of macula, left eye: Secondary | ICD-10-CM | POA: Diagnosis not present

## 2020-11-15 DIAGNOSIS — H2513 Age-related nuclear cataract, bilateral: Secondary | ICD-10-CM | POA: Diagnosis not present

## 2020-11-15 LAB — HM DIABETES EYE EXAM

## 2020-11-24 ENCOUNTER — Other Ambulatory Visit: Payer: Self-pay

## 2020-11-24 DIAGNOSIS — E1169 Type 2 diabetes mellitus with other specified complication: Secondary | ICD-10-CM

## 2020-11-24 DIAGNOSIS — E113293 Type 2 diabetes mellitus with mild nonproliferative diabetic retinopathy without macular edema, bilateral: Secondary | ICD-10-CM

## 2020-11-24 DIAGNOSIS — Z0000 Encounter for general adult medical examination without abnormal findings: Secondary | ICD-10-CM

## 2020-11-24 DIAGNOSIS — R351 Nocturia: Secondary | ICD-10-CM

## 2020-11-24 DIAGNOSIS — I10 Essential (primary) hypertension: Secondary | ICD-10-CM

## 2020-11-27 ENCOUNTER — Other Ambulatory Visit: Payer: Self-pay

## 2020-11-27 ENCOUNTER — Other Ambulatory Visit: Payer: Medicare HMO

## 2020-11-27 DIAGNOSIS — E785 Hyperlipidemia, unspecified: Secondary | ICD-10-CM | POA: Diagnosis not present

## 2020-11-27 DIAGNOSIS — E113293 Type 2 diabetes mellitus with mild nonproliferative diabetic retinopathy without macular edema, bilateral: Secondary | ICD-10-CM | POA: Diagnosis not present

## 2020-11-27 DIAGNOSIS — Z0000 Encounter for general adult medical examination without abnormal findings: Secondary | ICD-10-CM | POA: Diagnosis not present

## 2020-11-27 DIAGNOSIS — R351 Nocturia: Secondary | ICD-10-CM | POA: Diagnosis not present

## 2020-11-27 DIAGNOSIS — E1169 Type 2 diabetes mellitus with other specified complication: Secondary | ICD-10-CM | POA: Diagnosis not present

## 2020-11-27 DIAGNOSIS — I10 Essential (primary) hypertension: Secondary | ICD-10-CM | POA: Diagnosis not present

## 2020-11-28 LAB — COMPLETE METABOLIC PANEL WITH GFR
AG Ratio: 1.5 (calc) (ref 1.0–2.5)
ALT: 14 U/L (ref 9–46)
AST: 18 U/L (ref 10–35)
Albumin: 4.3 g/dL (ref 3.6–5.1)
Alkaline phosphatase (APISO): 58 U/L (ref 35–144)
BUN: 13 mg/dL (ref 7–25)
CO2: 24 mmol/L (ref 20–32)
Calcium: 8.9 mg/dL (ref 8.6–10.3)
Chloride: 105 mmol/L (ref 98–110)
Creat: 1.06 mg/dL (ref 0.70–1.28)
Globulin: 2.8 g/dL (calc) (ref 1.9–3.7)
Glucose, Bld: 107 mg/dL — ABNORMAL HIGH (ref 65–99)
Potassium: 3.9 mmol/L (ref 3.5–5.3)
Sodium: 139 mmol/L (ref 135–146)
Total Bilirubin: 0.8 mg/dL (ref 0.2–1.2)
Total Protein: 7.1 g/dL (ref 6.1–8.1)
eGFR: 74 mL/min/{1.73_m2} (ref >=60)

## 2020-11-28 LAB — CBC WITH DIFFERENTIAL/PLATELET
Absolute Monocytes: 396 cells/uL (ref 200–950)
Basophils Absolute: 30 cells/uL (ref 0–200)
Basophils Relative: 0.8 %
Eosinophils Absolute: 30 cells/uL (ref 15–500)
Eosinophils Relative: 0.8 %
HCT: 40.3 % (ref 38.5–50.0)
Hemoglobin: 13.4 g/dL (ref 13.2–17.1)
Lymphs Abs: 1388 cells/uL (ref 850–3900)
MCH: 31 pg (ref 27.0–33.0)
MCHC: 33.3 g/dL (ref 32.0–36.0)
MCV: 93.3 fL (ref 80.0–100.0)
MPV: 9.4 fL (ref 7.5–12.5)
Monocytes Relative: 10.7 %
Neutro Abs: 1857 cells/uL (ref 1500–7800)
Neutrophils Relative %: 50.2 %
Platelets: 163 10*3/uL (ref 140–400)
RBC: 4.32 10*6/uL (ref 4.20–5.80)
RDW: 12.6 % (ref 11.0–15.0)
Total Lymphocyte: 37.5 %
WBC: 3.7 10*3/uL — ABNORMAL LOW (ref 3.8–10.8)

## 2020-11-28 LAB — LIPID PANEL
Cholesterol: 213 mg/dL — ABNORMAL HIGH (ref <200)
HDL: 98 mg/dL (ref >=40)
LDL Cholesterol (Calc): 96 mg/dL (calc)
Non-HDL Cholesterol (Calc): 115 mg/dL (calc) (ref <130)
Total CHOL/HDL Ratio: 2.2 (calc) (ref <5.0)
Triglycerides: 95 mg/dL (ref <150)

## 2020-11-28 LAB — TSH: TSH: 1.67 mIU/L (ref 0.40–4.50)

## 2020-11-28 LAB — PSA: PSA: 0.28 ng/mL (ref <=4.00)

## 2020-11-28 LAB — HEMOGLOBIN A1C
Hgb A1c MFr Bld: 6.4 % of total Hgb — ABNORMAL HIGH (ref <5.7)
Mean Plasma Glucose: 137 mg/dL
eAG (mmol/L): 7.6 mmol/L

## 2020-12-04 ENCOUNTER — Other Ambulatory Visit: Payer: Self-pay

## 2020-12-04 ENCOUNTER — Ambulatory Visit (INDEPENDENT_AMBULATORY_CARE_PROVIDER_SITE_OTHER): Payer: Medicare HMO | Admitting: Family Medicine

## 2020-12-04 ENCOUNTER — Encounter: Payer: Self-pay | Admitting: Family Medicine

## 2020-12-04 VITALS — BP 131/68 | HR 65 | Ht 71.5 in | Wt 192.6 lb

## 2020-12-04 DIAGNOSIS — E785 Hyperlipidemia, unspecified: Secondary | ICD-10-CM | POA: Diagnosis not present

## 2020-12-04 DIAGNOSIS — Z0000 Encounter for general adult medical examination without abnormal findings: Secondary | ICD-10-CM | POA: Diagnosis not present

## 2020-12-04 DIAGNOSIS — Z1211 Encounter for screening for malignant neoplasm of colon: Secondary | ICD-10-CM

## 2020-12-04 DIAGNOSIS — E1169 Type 2 diabetes mellitus with other specified complication: Secondary | ICD-10-CM | POA: Diagnosis not present

## 2020-12-04 DIAGNOSIS — L720 Epidermal cyst: Secondary | ICD-10-CM

## 2020-12-04 MED ORDER — PRAVASTATIN SODIUM 40 MG PO TABS: 40.0000 mg | ORAL_TABLET | Freq: Every day | ORAL | 3 refills | Status: DC

## 2020-12-04 NOTE — Patient Instructions (Addendum)
Thank you for coming to the office today.  Consider COVID booster  Cologuard ordered to your house.  Recent Labs    03/06/20 0852 05/29/20 1103 11/27/20 1314  HGBA1C 6.9* 6.7* 6.4*   Letter today for the shoes.   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  If you have any other questions or concerns, please feel free to call the office or send a message through MyChart. You may also schedule an earlier appointment if necessary.  Additionally, you may be receiving a survey about your experience at our office within a few days to 1 week by e-mail or mail. We value your feedback.  Saralyn Pilar, DO [facility], [place]

## 2020-12-04 NOTE — Progress Notes (Signed)
Subjective:    Patient ID: Christopher Friendly., male    DOB: July 09, 1945, 75 y.o.   MRN: [id number]  Christopher Hupp. is a 75 y.o. male presenting on 12/04/2020 for Annual Exam   HPI  Here for Annual Physical and Lab Review.  CHRONIC DM, Type 2 w/ Neuropathy See prior notes for background info - Today patient reports he is doing very well overall. No new major concerns. No further significant hypoglycemia readings.  Last A1c 6.4 (12/87/8676) - Trulicity 7.2CN weekly injection - Metformin 1078m BID - OFF GLipizide Reports good compliance. Tolerating well w/o side-effects Currently on ACEi Lifestyle: Weight up 2-3 lbs in past 6 months - Diet (improve diet) - Exercise (Limited exercise due to time - gradually improving) - Dr WEllin Mayhewlast DM Eye exam 11/15/20 no DM Retinopathy Denies hypoglycemia, polyuria, visual changes, numbness or tingling.  HYPERLIPIDEMIA: - Reports no concerns. Last lipid panel 11/2020, controlled  - Currently taking Pravastatin 434m tolerating well without side effects or myalgias    CHRONIC HTN: Reports no new concerns. Controlled without problem. Not checking BP regularly Current Meds - Lisinopril-HCTZ 20-12.25m33m HALF pill daily Reports good compliance, took meds today. Tolerating well, w/o complaints. Admits can have episodic headache flare with BP elevated. Denies CP, dyspnea, HA, edema, dizziness / lightheadedness    Erectile Dysfunction Takes occasional dose generic Sildenafil x 2 = 28m90mN He has run out of med or nearly run out needs re order higher pill count Has improved since he has taken half BP pill He ordered his own med on this one.   Additional concern   Skin Tag / Cyst on back Reports 2 areas on back that bother him time to time, can be sore or swell, now no problem   Health Maintenance:  Updated Flu Shot  PSA 0.28 negative 11/2020  COVID19 booster not updated yet.   Last cologuard 08/18/17 negative. Repeat due at next  visit in 2022 for Annual Physical   Depression screen PHQ [facility] 07/11/2020 05/29/2020 12/14/2019  Decreased Interest 0 0 0  Down, Depressed, Hopeless 0 0 0  PHQ - 2 Score 0 0 0  Altered sleeping - 0 -  Tired, decreased energy - 0 -  Change in appetite - 0 -  Feeling bad or failure about yourself  - 0 -  Trouble concentrating - 0 -  Moving slowly or fidgety/restless - 0 -  Suicidal thoughts - 0 -  PHQ-9 Score - 0 -  Difficult doing work/chores - Not difficult at all -    Past Medical History:  Diagnosis Date   Erectile dysfunction    Hypertension    Right-sided chest pain    Past Surgical History:  Procedure Laterality Date   ROTATOR CUFF REPAIR     Social History   Socioeconomic History   Marital status: Divorced    Spouse name: Not on file   Number of children: Not on file   Years of education: Not on file   Highest education level: Not on file  Occupational History   Not on file  Tobacco Use   Smoking status: Some Days    Types: Cigarettes   Smokeless tobacco: Current   Tobacco comments:    pack of cigarettes last 1-2 months - doesnt inhale  Vaping Use   Vaping Use: Never used  Substance and Sexual Activity   Alcohol use: Yes    Alcohol/week: 1.0 standard drink    Types: 1 Cans of beer per  week    Comment: 1-2 beers week    Drug use: No   Sexual activity: Not on file  Other Topics Concern   Not on file  Social History Narrative   Working 1-2 days a week .    Social Determinants of Health   Financial Resource Strain: Low Risk    Difficulty of Paying Living Expenses: Not hard at all  Food Insecurity: No Food Insecurity   Worried About Charity fundraiser in the Last Year: Never true   Bureau in the Last Year: Never true  Transportation Needs: No Transportation Needs   Lack of Transportation (Medical): No   Lack of Transportation (Non-Medical): No  Physical Activity: Inactive   Days of Exercise per Week: 0 days   Minutes of Exercise per  Session: 0 min  Stress: No Stress Concern Present   Feeling of Stress : Only a little  Social Connections: Not on file  Intimate Partner Violence: Not on file   Family History  Problem Relation Age of Onset   Cancer Brother        Throat   Current Outpatient Medications on File Prior to Visit  Medication Sig   ACCU-CHEK AVIVA PLUS test strip Check sugar up to 2 x daily   Accu-Chek FastClix Lancets MISC Check sugar up to 2 x daily   Accu-Chek Softclix Lancets lancets Check sugar up to 2 x daily   acetaminophen (TYLENOL) 325 MG tablet Take 2 tablets (650 mg total) by mouth every 6 (six) hours as needed for mild pain or headache (fever >/= 101).   adapalene (DIFFERIN) 0.1 % gel Apply topically at bedtime. As needed (Patient not taking: Reported on 07/11/2020)   Blood Glucose Monitoring Suppl (ACCU-CHEK AVIVA PLUS) w/Device KIT Use to check blood sugar up to x 2 daily   Dulaglutide (TRULICITY) 1.5 NK/5.3ZJ SOPN Inject 1.5 mg into the skin once a week.   lisinopril-hydrochlorothiazide (ZESTORETIC) 20-12.5 MG tablet Take 0.5 tablets by mouth daily.   metFORMIN (GLUCOPHAGE) 1000 MG tablet TAKE 1 TABLET(1000 MG) BY MOUTH TWICE DAILY WITH A MEAL   Selenium Sulfide 2.25 % SHAM APPLY EXTERNALLY TO THE AFFECTED AREA DAILY AS NEEDED FOR IRRITATION (Patient not taking: No sig reported)   sildenafil (REVATIO) 20 MG tablet Take 2-5 pills about 30 min prior to sex.   No current facility-administered medications on file prior to visit.    Review of Systems  Constitutional:  Negative for activity change, appetite change, chills, diaphoresis, fatigue and fever.  HENT:  Negative for congestion and hearing loss.   Eyes:  Negative for visual disturbance.  Respiratory:  Negative for cough, chest tightness, shortness of breath and wheezing.   Cardiovascular:  Negative for chest pain, palpitations and leg swelling.  Gastrointestinal:  Negative for abdominal pain, constipation, diarrhea, nausea and vomiting.   Genitourinary:  Negative for dysuria, frequency and hematuria.  Musculoskeletal:  Negative for arthralgias and neck pain.  Skin:  Negative for rash.  Neurological:  Negative for dizziness, weakness, light-headedness, numbness and headaches.  Hematological:  Negative for adenopathy.  Psychiatric/Behavioral:  Negative for behavioral problems, dysphoric mood and sleep disturbance.   Per HPI unless specifically indicated above      Objective:    BP 131/68   Pulse 65   Ht 5' 11.5" (1.816 m)   Wt 192 lb 9.6 oz (87.4 kg)   SpO2 100%   BMI 26.49 kg/m   Wt Readings from Last 3 Encounters:  12/04/20 192  lb 9.6 oz (87.4 kg)  07/11/20 190 lb (86.2 kg)  05/29/20 189 lb 12.8 oz (86.1 kg)    Physical Exam Vitals and nursing note reviewed.  Constitutional:      General: He is not in acute distress.    Appearance: He is well-developed. He is not diaphoretic.     Comments: Well-appearing, comfortable, cooperative  HENT:     Head: Normocephalic and atraumatic.  Eyes:     General:        Right eye: No discharge.        Left eye: No discharge.     Conjunctiva/sclera: Conjunctivae normal.     Pupils: Pupils are equal, round, and reactive to light.  Neck:     Thyroid: No thyromegaly.  Cardiovascular:     Rate and Rhythm: Normal rate and regular rhythm.     Pulses: Normal pulses.     Heart sounds: Normal heart sounds. No murmur heard. Pulmonary:     Effort: Pulmonary effort is normal. No respiratory distress.     Breath sounds: Normal breath sounds. No wheezing or rales.  Abdominal:     General: Bowel sounds are normal. There is no distension.     Palpations: Abdomen is soft. There is no mass.     Tenderness: There is no abdominal tenderness.  Musculoskeletal:        General: No tenderness. Normal range of motion.     Cervical back: Normal range of motion and neck supple.     Comments: Upper / Lower Extremities: - Normal muscle tone, strength bilateral upper extremities 5/5, lower  extremities 5/5  Lymphadenopathy:     Cervical: No cervical adenopathy.  Skin:    General: Skin is warm and dry.     Findings: Lesion (3 cm large soft mobile non indurated, no erythema, epidermoid cyst midline thoracic spine mid back) present. No erythema or rash.  Neurological:     Mental Status: He is alert and oriented to person, place, and time.     Comments: Distal sensation intact to light touch all extremities  Psychiatric:        Mood and Affect: Mood normal.        Behavior: Behavior normal.        Thought Content: Thought content normal.     Comments: Well groomed, good eye contact, normal speech and thoughts    Diabetic Mclaughlin Exam - Simple   Simple Mclaughlin Form Diabetic Mclaughlin exam was performed with the following findings: Yes 12/04/2020  1:39 PM  Visual Inspection See comments: Yes Sensation Testing Intact to touch and monofilament testing bilaterally: Yes Pulse Check Posterior Tibialis and Dorsalis pulse intact bilaterally: Yes Comments Mild dry skin callus formation heels and forefoot bilateral. No ulceration      Results for orders placed or performed in visit on 11/24/20  TSH  Result Value Ref Range   TSH 1.67 0.40 - 4.50 mIU/L  PSA  Result Value Ref Range   PSA 0.28 < OR = 4.00 ng/mL  Lipid panel  Result Value Ref Range   Cholesterol 213 (H) <200 mg/dL   HDL 98 > OR = 40 mg/dL   Triglycerides 95 <150 mg/dL   LDL Cholesterol (Calc) 96 mg/dL (calc)   Total CHOL/HDL Ratio 2.2 <5.0 (calc)   Non-HDL Cholesterol (Calc) 115 <130 mg/dL (calc)  COMPLETE METABOLIC PANEL WITH GFR  Result Value Ref Range   Glucose, Bld 107 (H) 65 - 99 mg/dL   BUN 13 7 - 25  mg/dL   Creat 1.06 0.70 - 1.28 mg/dL   eGFR 74 > OR = 60 mL/min/1.52m   BUN/Creatinine Ratio NOT APPLICABLE 6 - 22 (calc)   Sodium 139 135 - 146 mmol/L   Potassium 3.9 3.5 - 5.3 mmol/L   Chloride 105 98 - 110 mmol/L   CO2 24 20 - 32 mmol/L   Calcium 8.9 8.6 - 10.3 mg/dL   Total Protein 7.1 6.1 - 8.1 g/dL    Albumin 4.3 3.6 - 5.1 g/dL   Globulin 2.8 1.9 - 3.7 g/dL (calc)   AG Ratio 1.5 1.0 - 2.5 (calc)   Total Bilirubin 0.8 0.2 - 1.2 mg/dL   Alkaline phosphatase (APISO) 58 35 - 144 U/L   AST 18 10 - 35 U/L   ALT 14 9 - 46 U/L  CBC with Differential/Platelet  Result Value Ref Range   WBC 3.7 (L) 3.8 - 10.8 Thousand/uL   RBC 4.32 4.20 - 5.80 Million/uL   Hemoglobin 13.4 13.2 - 17.1 g/dL   HCT 40.3 38.5 - 50.0 %   MCV 93.3 80.0 - 100.0 fL   MCH 31.0 27.0 - 33.0 pg   MCHC 33.3 32.0 - 36.0 g/dL   RDW 12.6 11.0 - 15.0 %   Platelets 163 140 - 400 Thousand/uL   MPV 9.4 7.5 - 12.5 fL   Neutro Abs 1,857 1,500 - 7,800 cells/uL   Lymphs Abs 1,388 850 - 3,900 cells/uL   Absolute Monocytes 396 200 - 950 cells/uL   Eosinophils Absolute 30 15 - 500 cells/uL   Basophils Absolute 30 0 - 200 cells/uL   Neutrophils Relative % 50.2 %   Total Lymphocyte 37.5 %   Monocytes Relative 10.7 %   Eosinophils Relative 0.8 %   Basophils Relative 0.8 %  Hemoglobin A1c  Result Value Ref Range   Hgb A1c MFr Bld 6.4 (H) <5.7 % of total Hgb   Mean Plasma Glucose 137 mg/dL   eAG (mmol/L) 7.6 mmol/L      Assessment & Plan:   Problem List Items Addressed This Visit     Type 2 diabetes mellitus with other specified complication [place]    Well-controlled DM with A1c at 6.4, on lifestyle and GLP1 No hypoglycemia Complications - DM retinopathy hyperlipidemia- increases risk of future cardiovascular complications  - OFF Glipizide  Plan:  1. Continue current therapy - Trulicity 13.4LPweekly, Metformin 10081mBID 2. Encourage improved lifestyle - low carb, low sugar diet, reduce portion size, continue improving regular exercise 3. Check CBG , bring log to next visit for review 4. Continue ACEi, Statin 5. UTD DM Eye [place]-controlled DM with A1c at 6.4, on lifestyle and GLP1 No hypoglycemia Complications - DM retinopathy hyperlipidemia- increases risk of future cardiovascular complications  - OFF  Glipizide  Plan:  1. Continue current therapy - Trulicity 1.3.7TKeekly, Metformin 100076mID 2. Encourage improved lifestyle - low carb, low sugar diet, reduce portion size, continue improving regular exercise 3. Check CBG , bring log to next visit for review 4. Continue ACEi, Statin 5. UTD DM Eye [name]      Relevant Medications   pravastatin (PRAVACHOL) 40 MG tablet   Hyperlipidemia associated with type 2 diabetes mellitus (HCC)    Controlled cholesterol on statin and lifestyle \The 10-year ASCVD risk score (Arnett DK, et al., 2019) is: 49.9%  Plan: 1. Continue current meds - Pravastatin 17m75mily 2. Encourage improved lifestyle - low carb/cholesterol, reduce portion size, continue improving regular exercise  Relevant Medications   pravastatin (PRAVACHOL) 40 MG tablet   Other Visit Diagnoses     Annual physical exam    -  Primary   Screening for colon cancer       Relevant Orders   Cologuard   Epidermoid cyst of skin of back       Relevant Orders   Ambulatory referral to General Surgery        Updated Health Maintenance information Reviewed recent lab results with patient Encouraged improvement to lifestyle with diet and exercise Goal of weight loss  Referral to Gen Surgery for epidermoid cyst, large on back thoracic spine. >3 cm.   Orders Placed This Encounter  Procedures   Cologuard   Ambulatory referral to General Surgery    Referral Priority:   Routine    Referral Type:   Surgical    Referral Reason:   Specialty Services Required    Requested Specialty:   General Surgery    Number of Visits Requested:   1     Meds ordered this encounter  Medications   pravastatin (PRAVACHOL) 40 MG tablet    Sig: Take 1 tablet (40 mg total) by mouth daily.    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  [name], [facility] 12/04/2020, 1:52 PM

## 2020-12-04 NOTE — Assessment & Plan Note (Addendum)
Well-controlled DM with A1c at 6.4, on lifestyle and GLP1 No hypoglycemia Complications - DM retinopathy hyperlipidemia- increases risk of future cardiovascular complications  - OFF Glipizide  Plan:  1. Continue current therapy - Trulicity 1.5mg  weekly, Metformin 1000mg  BID 2. Encourage improved lifestyle - low carb, low sugar diet, reduce portion size, continue improving regular exercise 3. Check CBG , bring log to next visit for review 4. Continue ACEi, Statin 5. UTD DM Eye [place]-controlled DM with A1c at 6.4, on lifestyle and GLP1 No hypoglycemia Complications - DM retinopathy hyperlipidemia- increases risk of future cardiovascular complications  - OFF Glipizide  Plan:  1. Continue current therapy - Trulicity 1.5mg  weekly, Metformin 1000mg  BID 2. Encourage improved lifestyle - low carb, low sugar diet, reduce portion size, continue improving regular exercise 3. Check CBG , bring log to next visit for review 4. Continue ACEi, Statin 5. UTD DM Eye [place] DM [place]

## 2020-12-05 NOTE — Assessment & Plan Note (Signed)
Controlled cholesterol on statin and lifestyle \\The  10-year ASCVD risk score (Arnett DK, et al., 2019) is: 49.9%  Plan: 1. Continue current meds - Pravastatin 40mg  daily 2. Encourage improved lifestyle - low carb/cholesterol, reduce portion size, continue improving regular exercise

## 2020-12-19 ENCOUNTER — Ambulatory Visit: Payer: Medicare HMO | Admitting: Surgery

## 2020-12-19 ENCOUNTER — Other Ambulatory Visit: Payer: Self-pay

## 2020-12-19 ENCOUNTER — Encounter: Payer: Self-pay | Admitting: Surgery

## 2020-12-19 VITALS — BP 145/77 | HR 79 | Temp 98.7°F | Ht 71.5 in | Wt 189.0 lb

## 2020-12-19 DIAGNOSIS — L723 Sebaceous cyst: Secondary | ICD-10-CM
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
## 2020-12-19 NOTE — Patient Instructions (Addendum)
We have removed a Cyst in our office today.  You will need to change the dressing daily. You may shower Thursday. When you shower, remove the dressing and let the warm soapy water run over the area, rinse well, and pat dry. Then you may redress the area with dry gauze.  The first two days you may see a lot of drainage, you may need to change your dressing multiple times a day.   We have placed one suture in the area. We will have you return in 7-10 days to have this removed.   Avoid activities that will place pressure to this area of the body for 1-2 weeks to avoid re-injury to incision site.  Please see your follow-up appointment provided. We will see you back in office to make sure this area is healed .If you have any questions or concerns prior to this appointment, call our office and speak with a nurse.    Excision of Skin Cysts or Lesions Excision of a skin lesion refers to the removal of a section of skin by making small cuts (incisions) in the skin. This procedure may be done to remove a cancerous (malignant) or noncancerous (benign) growth on the skin. It is typically done to treat or prevent cancer or infection. It may also be done to improve cosmetic appearance. The procedure may be done to remove: Cancerous growths, such as basal cell carcinoma, squamous cell carcinoma, or melanoma. Noncancerous growths, such as a cyst or lipoma. Growths, such as moles or skin tags, which may be removed for cosmetic reasons.  Various excision or surgical techniques may be used depending on your condition, the location of the lesion, and your overall health. Tell a health care provider about: Any allergies you have. All medicines you are taking, including vitamins, herbs, eye drops, creams, and over-the-counter medicines. Any problems you or family members have had with anesthetic medicines. Any blood disorders you have. Any surgeries you have had. Any medical conditions you have. Whether you  are pregnant or may be pregnant. What are the risks? Generally, this is a safe procedure. However, problems may occur, including: Bleeding. Infection. Scarring. Recurrence of the cyst, lipoma, or cancer. Changes in skin sensation or appearance, such as discoloration or swelling. Reaction to the anesthetics. Allergic reaction to surgical materials or ointments. Damage to nerves, blood vessels, muscles, or other structures. Continued pain.  What happens before the procedure? Ask your health care provider about: Changing or stopping your regular medicines. This is especially important if you are taking diabetes medicines or blood thinners. Taking medicines such as aspirin and ibuprofen. These medicines can thin your blood. Do not take these medicines before your procedure if your health care provider instructs you not to. You may be asked to take certain medicines. You may be asked to stop smoking. You may have an exam or testing. Plan to have someone take you home after the procedure. Plan to have someone help you with activities during recovery. What happens during the procedure? To reduce your risk of infection: Your health care team will wash or sanitize their hands. Your skin will be washed with soap. You will be given a medicine to numb the area (local anesthetic). One of the following excision techniques will be performed. At the end of any of these procedures, antibiotic ointment will be applied as needed. Each of the following techniques may vary among health care providers and hospitals. Complete Surgical Excision The area of skin that needs to be removed  will be marked with a pen. Using a small scalpel or scissors, the surgeon will gently cut around and under the lesion until it is completely removed. The lesion will be placed in a fluid and sent to the lab for examination. If necessary, bleeding will be controlled with a device that delivers heat (electrocautery). The edges of  the wound may be stitched (sutured) together, and a bandage (dressing) will be applied. This procedure may be performed to treat a cancerous growth or a noncancerous cyst or lesion. Excision of a Cyst The surgeon will make an incision on the cyst. The entire cyst will be removed through the incision. The incision may be closed with sutures.  What happens after the procedure? Return to your normal activities as told by your health care provider. Talk with your health care provider to discuss any test results, treatment options, and if necessary, the need for more tests. This information is not intended to replace advice given to you by your health care provider. Make sure you discuss any questions you have with your health care provider. Document Released: 04/03/2009 Document Revised: 06/15/2015 Document Reviewed: 02/23/2014 Elsevier Interactive Patient Education  2018 Elsevier I

## 2020-12-20 ENCOUNTER — Ambulatory Visit (INDEPENDENT_AMBULATORY_CARE_PROVIDER_SITE_OTHER): Payer: Medicare HMO | Admitting: Pharmacist

## 2020-12-20 DIAGNOSIS — Z7984 Long term (current) use of oral hypoglycemic drugs: Secondary | ICD-10-CM | POA: Diagnosis not present

## 2020-12-20 DIAGNOSIS — E785 Hyperlipidemia, unspecified: Secondary | ICD-10-CM | POA: Diagnosis not present

## 2020-12-20 DIAGNOSIS — E1169 Type 2 diabetes mellitus with other specified complication: Secondary | ICD-10-CM

## 2020-12-20 DIAGNOSIS — Z7985 Long-term (current) use of injectable non-insulin antidiabetic drugs: Secondary | ICD-10-CM

## 2020-12-20 DIAGNOSIS — F17210 Nicotine dependence, cigarettes, uncomplicated: Secondary | ICD-10-CM | POA: Diagnosis not present

## 2020-12-20 DIAGNOSIS — I10 Essential (primary) hypertension: Secondary | ICD-10-CM | POA: Diagnosis not present

## 2020-12-20 DIAGNOSIS — Z1211 Encounter for screening for malignant neoplasm of colon: Secondary | ICD-10-CM | POA: Diagnosis not present

## 2020-12-20 NOTE — Patient Instructions (Signed)
Visit Information  Thank you for taking time to visit with me today. Please don't hesitate to contact me if I can be of assistance to you before our next scheduled telephone appointment.  Following are the goals we discussed today:  Our goal A1c is less than 7%. This corresponds with fasting sugars less than 130 and 2 hour after meal sugars less than 180. Please check your blood sugar and keep a record  Please check your home blood pressure, keep a log of the results and bring this with you to your medical appointments.  Our goal bad cholesterol, or LDL, is less than 70 . This is why it is important to continue taking your pravastatin  Please use your weekly pillbox to organize your medications  Feel free to call me with any questions or concerns. I look forward to our next call!   Estelle Grumbles, PharmD, BCACP Clinical Pharmacist [facility] [phone]  Our next appointment is by telephone on 12/14 at 2 pm  Please call the care guide team at [phone] if you need to cancel or reschedule your appointment.    The patient verbalized understanding of instructions, educational materials, and care plan provided today and declined offer to receive copy of patient instructions, educational materials, and care plan.
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
## 2020-12-25 LAB — COLOGUARD: COLOGUARD: NEGATIVE

## 2020-12-26 ENCOUNTER — Telehealth: Payer: Self-pay | Admitting: Pharmacy Technician

## 2020-12-26 DIAGNOSIS — Z596 Low income: Secondary | ICD-10-CM

## 2020-12-26 NOTE — Progress Notes (Signed)
[facility] Customer service manager [facility])                                            [facility] Quality Pharmacy Team    12/26/2020  [name] [birth year] [phone]  FOR 2023 RE ENROLLMENT                                      Medication Assistance Referral  Referral From: [facility] Embedded RPh [name]   Medication/Company: [name] / [name] Patient application portion:  Mailed Provider application portion: Faxed  to Dr. Althea Mclaughlin Provider address/fax verified via: Office website     [name] P. [name], CPhT [facility] [employer]  [phone]

## 2020-12-28 ENCOUNTER — Other Ambulatory Visit: Payer: Self-pay

## 2020-12-28 ENCOUNTER — Encounter: Payer: Self-pay | Admitting: Surgery

## 2020-12-28 ENCOUNTER — Ambulatory Visit (INDEPENDENT_AMBULATORY_CARE_PROVIDER_SITE_OTHER): Payer: Medicare HMO | Admitting: Surgery

## 2020-12-28 VITALS — BP 140/76 | HR 68 | Temp 98.6°F | Ht 71.5 in | Wt 192.2 lb

## 2020-12-28 DIAGNOSIS — L723 Sebaceous cyst: Secondary | ICD-10-CM

## 2020-12-28 DIAGNOSIS — Z09 Encounter for follow-up examination after completed treatment for conditions other than malignant neoplasm: Secondary | ICD-10-CM

## 2020-12-28 NOTE — Patient Instructions (Signed)
If you have any concerns or questions, please feel free to call our office.   Incision and Drainage, Care After This sheet gives you information about how to care for yourself after your procedure. Your health care provider may also give you more specific instructions. If you have problems or questions, contact your health care provider. What can I expect after the procedure? After the procedure, it is common to have: Pain or discomfort around the incision site. Blood, fluid, or pus (drainage) from the incision. Redness and firm skin around the incision site. Follow these instructions at home: Medicines Take over-the-counter and prescription medicines only as told by your health care provider. If you were prescribed an antibiotic medicine, use or take it as told by your health care provider. Do not stop using the antibiotic even if you start to feel better. Wound care Follow instructions from your health care provider about how to take care of your wound. Make sure you: Wash your hands with soap and water before and after you change your bandage (dressing). If soap and water are not available, use hand sanitizer. Change your dressing and packing as told by your health care provider. If your dressing is dry or stuck when you try to remove it, moisten or wet the dressing with saline or water so that it can be removed without harming your skin or tissues. If your wound is packed, leave it in place until your health care provider tells you to remove it. To remove the packing, moisten or wet the packing with saline or water so that it can be removed without harming your skin or tissues. Leave stitches (sutures), skin glue, or adhesive strips in place. These skin closures may need to stay in place for 2 weeks or longer. If adhesive strip edges start to loosen and curl up, you may trim the loose edges. Do not remove adhesive strips completely unless your health care provider tells you to do that. Check  your wound every day for signs of infection. Check for: More redness, swelling, or pain. More fluid or blood. Warmth. Pus or a bad smell. If you were sent home with a drain tube in place, follow instructions from your health care provider about: How to empty it. How to care for it at home.  General instructions Rest the affected area. Do not take baths, swim, or use a hot tub until your health care provider approves. Ask your health care provider if you may take showers. You may only be allowed to take sponge baths. Return to your normal activities as told by your health care provider. Ask your health care provider what activities are safe for you. Your health care provider may put you on activity or lifting restrictions. The incision will continue to drain. It is normal to have some clear or slightly bloody drainage. The amount of drainage should lessen each day. Do not apply any creams, ointments, or liquids unless you have been told to by your health care provider. Keep all follow-up visits as told by your health care provider. This is important. Contact a health care provider if: Your cyst or abscess returns. You have a fever or chills. You have more redness, swelling, or pain around your incision. You have more fluid or blood coming from your incision. Your incision feels warm to the touch. You have pus or a bad smell coming from your incision. You have red streaks above or below the incision site. Get help right away if: You have  severe pain or bleeding. You cannot eat or drink without vomiting. You have decreased urine output. You become short of breath. You have chest pain. You cough up blood. The affected area becomes numb or starts to tingle. These symptoms may represent a serious problem that is an emergency. Do not wait to see if the symptoms will go away. Get medical help right away. Call your local emergency services (911 in the [place] Do not drive yourself to the  hospital. Summary After this procedure, it is common to have fluid, blood, or pus coming from the surgery site. Follow all home care instructions. You will be told how to take care of your incision, how to check for infection, and how to take medicines. If you were prescribed an antibiotic medicine, take it as told by your health care provider. Do not stop taking the antibiotic even if you start to feel better. Contact a health care provider if you have increased redness, swelling, or pain around your incision. Get help right away if you have chest pain, you vomit, you cough up blood, or you have shortness of breath. Keep all follow-up visits as told by your health care provider. This is important. This information is not intended to replace advice given to you by your health care provider. Make sure you discuss any questions you have with your health care provider. Document Revised: 12/08/2017 Document Reviewed: 12/08/2017 Elsevier Patient Education  2022 [employer].

## 2020-12-28 NOTE — Progress Notes (Signed)
[facility] POST-OP OFFICE VISIT  12/28/2020  HPI: [name]. is a 75 y.o. male 9 days s/p I&D, with partial closure of inflamed sebaceous cyst of mid back.  He reports a small amount of drainage, changes his dressing daily.  Denies pain denies fevers and chills.  Vital signs: BP 140/76   Pulse 68   Temp 98.6 F (37 C) (Oral)   Ht 5' 11.5" (1.816 m)   Wt 192 lb 3.2 oz (87.2 kg)   SpO2 96%   BMI 26.43 kg/m    Physical Exam: Constitutional: Appears well. Skin: I removed the vertical mattress suture of the lesion.  There is a little more swelling than I would hope for at this time point in time.  I obtained a small amount of drainage, a drop or to from what remains open.  I encouraged him to take off his dressing shower and replace the dressing as needed.  As long as there is some sort of drainage he should keep a Band-Aid on it at least.  Assessment/Plan: This is a 75 y.o. male 9 days s/p I&D/excision with partial closure of inflamed sebaceous cyst mid back.  Patient Active Problem List   Diagnosis Date Noted   Throat swelling 08/13/2019   External hemorrhoid 10/14/2017   Diabetic retinopathy (HCC) 08/02/2016   Acne vulgaris 12/06/2015   Type 2 diabetes mellitus with other specified complication (HCC) 07/19/2014   ED (erectile dysfunction) of organic origin 11/28/2011   Essential hypertension 11/28/2011   Hyperlipidemia associated with type 2 diabetes mellitus (HCC) 11/28/2011    -We will be glad to help him with his wound since he cannot appreciate it very well by repeat visit in 2 weeks.   Campbell Lerner M.D., FACS 12/28/2020, 1:26 PM

## 2021-01-03 ENCOUNTER — Ambulatory Visit (INDEPENDENT_AMBULATORY_CARE_PROVIDER_SITE_OTHER): Payer: Medicare HMO | Admitting: Pharmacist

## 2021-01-03 DIAGNOSIS — E1169 Type 2 diabetes mellitus with other specified complication: Secondary | ICD-10-CM

## 2021-01-03 DIAGNOSIS — I10 Essential (primary) hypertension: Secondary | ICD-10-CM

## 2021-01-03 DIAGNOSIS — E785 Hyperlipidemia, unspecified: Secondary | ICD-10-CM

## 2021-01-03 NOTE — Chronic Care Management (AMB) (Signed)
Chronic Care Management CCM Pharmacy Note  01/03/2021 Name:  [name]. MRN:  [id number] DOB:  1945/09/26   Subjective: Christopher Adami. is an 75 y.o. year old male who is a primary patient of Christopher Hauser, DO.  The CCM team was consulted for assistance with disease management and care coordination needs.    Engaged with patient by telephone for follow up visit for pharmacy case management and/or care coordination services.   Objective:  Medications Reviewed Today     Reviewed by Rennis Petty, RPH-CPP (Pharmacist) on 01/03/21 at [place] List Status: <None>   Medication Order Taking? Sig Documenting Provider Last Dose Status Informant  ACCU-CHEK AVIVA PLUS test strip [id number]  Check sugar up to 2 x daily Christopher Hauser, DO  Active Pharmacy Records  Accu-Chek FastClix Lancets MISC [id number]  Check sugar up to 2 x daily Christopher Hauser, DO  Active Pharmacy Records  Accu-Chek Softclix Lancets lancets [id number]  Check sugar up to 2 x daily Christopher Hauser, DO  Active Pharmacy Records  acetaminophen (TYLENOL) 325 MG tablet [id number]  Take 2 tablets (650 mg total) by mouth every 6 (six) hours as needed for mild pain or headache (fever >/= 101). [name], MD  Active   Blood Glucose Monitoring Suppl (ACCU-CHEK AVIVA PLUS) w/Device KIT [id number]  Use to check blood sugar up to x 2 daily Christopher Hauser, DO  Active Pharmacy Records  Dulaglutide (TRULICITY) 1.5 HF/0.2OV [facility] [id number] Yes Inject 1.5 mg into the skin once a week. Christopher Hauser, DO Taking Active Pharmacy Records           Med Note [facility] A   Thu Nov 19, 2018  2:04 PM) On Mondays  lisinopril-hydrochlorothiazide (ZESTORETIC) 20-12.5 MG tablet [id number] Yes Take 0.5 tablets by mouth daily. Christopher Hauser, DO Taking Active   metFORMIN (GLUCOPHAGE) 1000 MG tablet [id number] Yes TAKE 1 TABLET(1000 MG) BY MOUTH TWICE DAILY WITH A MEAL  [name], [name], DO Taking Active   pravastatin (PRAVACHOL) 40 MG tablet [id number] Yes Take 1 tablet (40 mg total) by mouth daily. Christopher Hauser, DO Taking Active   Selenium Sulfide 2.25 % SHAM [id number]  APPLY EXTERNALLY TO THE AFFECTED AREA DAILY AS NEEDED FOR IRRITATION [employer] [name], DO  Active   sildenafil (REVATIO) 20 MG tablet [id number]  Take 2-5 pills about 30 min prior to sex. Christopher Hauser, DO  Active             Pertinent Labs:  Lab Results  Component Value Date   HGBA1C 6.4 (H) 11/27/2020   Lab Results  Component Value Date   CHOL 213 (H) 11/27/2020   HDL 98 11/27/2020   LDLCALC 96 11/27/2020   TRIG 95 11/27/2020   CHOLHDL 2.2 11/27/2020   Lab Results  Component Value Date   CREATININE 1.06 11/27/2020   BUN 13 11/27/2020   NA 139 11/27/2020   K 3.9 11/27/2020   CL 105 11/27/2020   CO2 24 11/27/2020    BP Readings from Last 3 Encounters:  12/28/20 140/76  12/19/20 (!) 145/77  12/04/20 131/68   Pulse Readings from Last 3 Encounters:  12/28/20 68  12/19/20 79  12/04/20 65     SDOH:  (Social Determinants of Health) assessments and interventions performed:    [place]  Review of patient past medical history, allergies, medications, health status, including review of consultants reports, laboratory and other test data,  was performed as part of comprehensive evaluation and provision of chronic care management services.   Care Plan : PharmD - Med Mgmt  Updates made by Rennis Petty, RPH-CPP since 01/03/2021 12:00 AM     Problem: Disease Progression      Long-Range Goal: Disease Progression Prevented or Minimized   Start Date: 03/13/2020  Expected End Date: 06/11/2020  This Visit's Progress: On track  Recent Progress: On track  Priority: High  Note:   Current Barriers:  Financial Barriers Patient APPROVED to received Trulicity through [place] patient assistance program through  01/20/2021 Knowledge deficits related to nutrition with diabetes  Pharmacist Clinical Goal(s):  Over the next 90 days, patient will maintain control of blood sugar as evidenced by A1C <7%  through collaboration with PharmD and provider.   Interventions: 1:1 collaboration with Christopher Hauser, DO regarding development and update of comprehensive plan of care as evidenced by provider attestation and co-signature Inter-disciplinary care team collaboration (see longitudinal plan of care) Perform chart review. Patient seen for Post-Op Visit with [facility] on 12/8 Today reports wound continues to heal well.  Hyperlipidemia: Uncontrolled; current treatment: pravastatin 40 mg daily Note patient's pravastatin dose previously increased on 12/20/2019 from pravastatin 10 mg to 40 mg daily. Contact patient's [place] today. From review of dispensing data and discussion with technician, identify pharmacy dispensed pravastatin 40 mg Rx to patient on 12/20/2019 and 04/03/2020, but then instead dispensed pravastatin 10 mg daily Rx on 05/19/2020 (each 90 day supplies) Today advise pharmacy to discontinue pravastatin 10 mg Rx. Patient picked up 90 day supply of pravastatin 40 mg daily on 11/6 and confirms taking as directed using weekly pillbox Discuss importance of lipid control for ASCVD risk reduction Counsel patient on lifestyle changes to help reduce LDL, including increasing exercise and having a well-balanced diet, particularly limiting saturated and trans fats. Encourage patient to review nutrition labels, particularly for saturated fat and trans fat content Exercise: discuss increasing exercise by walking on treadmill ~30 minutes at gym 1-2 days/week Will mail patient handout How Can I Improve My Cholesterol? from [employer]  Diabetes: Controlled; current treatment: Metformin 1000 mg twice daily Trulicity 1.5 mg weekly on Mondays Recent glucose  readings ranging: 119-140s Have counseled on impact of diet and exercise on blood sugar Counseled on importance of having regular well-balanced meals and limiting carbohydrate portion sizes Have encouraged patient to work towards an exercise goal of 30 minutes x 5 days/week  Hypertension: Current treatment: lisinopril/HCTZ 20-12.5 mg - 1/2 tablet daily Denies checking home blood pressure recently Have counseled on impact of salt/sodium on blood pressure and encouraged patient to review nutrition labels for sodium content Encouraged patient to continue to monitor BP outside office, bring log to medical appointments and if persistently >140/90 or new symptoms notify office sooner   Medication Adherence Reports using weekly pillbox and denies missed doses  Tobacco Use: Reports currently smokes cigarettes only occasionally; <1 cigarette/day Counsel patient on benefits of smoking cessation Patient reports has not smoked in >2 weeks  Medication Assistance: Reports has sufficient supply of Trulicity from assistance program to last until the end of current calendar year Collaborating with [place] [employer] for aid to patient with re-enrollment in [place] patient assistance program for 2023 calendar year Today reports received application in mail, will complete and mail back to [place]   Patient Goals/Self-Care Activities Over the next 90 days, patient will:  Self administers all medications as prescribed Using weekly pillbox  as adherence tool Check blood sugars and keep log of results Contact pharmacy to order refills of medication Call provider office for new concerns or questions.  Follow Up Plan: Telephone follow up appointment with care management team member scheduled for: 02/12/2021 at 1:15 pm       Wallace Cullens, PharmD, BCACP, [place] [phone]

## 2021-01-03 NOTE — Patient Instructions (Signed)
Visit Information  Thank you for taking time to visit with me today. Please don't hesitate to contact me if I can be of assistance to you before our next scheduled telephone appointment.  Following are the goals we discussed today:   Goals Addressed             This Visit's Progress    Pharmacy Goals       Our goal A1c is less than 7%. This corresponds with fasting sugars less than 130 and 2 hour after meal sugars less than 180. Please check your blood sugar and keep a record  Please check your home blood pressure, keep a log of the results and bring this with you to your medical appointments.  Our goal bad cholesterol, or LDL, is less than 70 . This is why it is important to continue taking your pravastatin 40 mg daily  Please use your weekly pillbox to organize your medications  Feel free to call me with any questions or concerns. I look forward to our next call!  Estelle Grumbles, PharmD, BCACP Clinical Pharmacist [facility] [phone]          Our next appointment is by telephone on 02/12/2021 at 1:15 pm  Please call the care guide team at [phone] if you need to cancel or reschedule your appointment.    The patient verbalized understanding of instructions, educational materials, and care plan provided today and declined offer to receive copy of patient instructions, educational materials, and care plan.

## 2021-01-16 ENCOUNTER — Ambulatory Visit: Payer: Medicare HMO | Admitting: Physician Assistant

## 2021-01-16 ENCOUNTER — Ambulatory Visit (INDEPENDENT_AMBULATORY_CARE_PROVIDER_SITE_OTHER): Payer: Medicare HMO | Admitting: Physician Assistant

## 2021-01-16 ENCOUNTER — Other Ambulatory Visit: Payer: Self-pay

## 2021-01-16 ENCOUNTER — Encounter: Payer: Self-pay | Admitting: Physician Assistant

## 2021-01-16 VITALS — BP 142/83 | HR 70 | Temp 98.5°F | Ht 71.5 in | Wt 191.4 lb

## 2021-01-16 DIAGNOSIS — Z09 Encounter for follow-up examination after completed treatment for conditions other than malignant neoplasm: Secondary | ICD-10-CM

## 2021-01-16 DIAGNOSIS — L723 Sebaceous cyst: Secondary | ICD-10-CM

## 2021-01-16 NOTE — Patient Instructions (Signed)
Please call our office if you have any questions or concerns.  

## 2021-01-16 NOTE — Progress Notes (Signed)
[facility] POST-OP OFFICE VISIT  01/16/2021  HPI: [name]. is a 75 y.o. male 28 days s/p I&D, with partial closure of inflamed sebaceous cyst of mid back with Dr Claudine Mouton.   No issues Stopped dressing x1 week ago; no drainage  No complaints  Vital signs: BP (!) 142/83    Pulse 70    Temp 98.5 F (36.9 C) (Oral)    Ht 5' 11.5" (1.816 m)    Wt 191 lb 6.4 oz (86.8 kg)    SpO2 96%    BMI 26.32 kg/m    Physical Exam: Constitutional: Well appearing male, NAD Skin: Small incision to his mid-back which is completely healed, no erythema  Assessment/Plan: This is a 75 y.o. male 28 days s/p I&D, with partial closure of inflamed sebaceous cyst of mid back    - Nothing further from surgical perspective; he can follow up as needed   -- Lynden Oxford, PA-C [facility] 01/16/2021, 11:44 AM [phone] M-F: 7am - 4pm

## 2021-01-20 DIAGNOSIS — I10 Essential (primary) hypertension: Secondary | ICD-10-CM

## 2021-01-20 DIAGNOSIS — E785 Hyperlipidemia, unspecified: Secondary | ICD-10-CM

## 2021-01-20 DIAGNOSIS — E1169 Type 2 diabetes mellitus with other specified complication: Secondary | ICD-10-CM | POA: Diagnosis not present

## 2021-02-12 ENCOUNTER — Ambulatory Visit (INDEPENDENT_AMBULATORY_CARE_PROVIDER_SITE_OTHER): Payer: Medicare HMO | Admitting: Pharmacist

## 2021-02-12 DIAGNOSIS — I10 Essential (primary) hypertension: Secondary | ICD-10-CM

## 2021-02-12 DIAGNOSIS — E1169 Type 2 diabetes mellitus with other specified complication: Secondary | ICD-10-CM

## 2021-02-12 DIAGNOSIS — E785 Hyperlipidemia, unspecified: Secondary | ICD-10-CM

## 2021-02-12 NOTE — Chronic Care Management (AMB) (Signed)
Chronic Care Management CCM Pharmacy Note  02/12/2021 Name:  [name]. MRN:  [id number] DOB:  [birth year]   Subjective: [name]. is an 76 y.o. year old male who is a primary patient of [name], DO.  The CCM team was consulted for assistance with disease management and care coordination needs.    Engaged with patient by telephone for follow up visit for pharmacy case management and/or care coordination services.   Objective:  Medications Reviewed Today     Reviewed by Rennis Petty, RPH-CPP (Pharmacist) on 02/12/21 at 1344  Med List Status: <None>   Medication Order Taking? Sig Documenting Provider Last Dose Status Informant  ACCU-CHEK AVIVA PLUS test strip [id number]  Check sugar up to 2 x daily [name], DO  Active Pharmacy Records  Accu-Chek FastClix Lancets MISC [id number]  Check sugar up to 2 x daily [name], DO  Active Pharmacy Records  Accu-Chek Softclix Lancets lancets [id number]  Check sugar up to 2 x daily [name], DO  Active Pharmacy Records  acetaminophen (TYLENOL) 325 MG tablet [id number]  Take 2 tablets (650 mg total) by mouth every 6 (six) hours as needed for mild pain or headache (fever >/= 101). [name], MD  Active   Blood Glucose Monitoring Suppl (ACCU-CHEK AVIVA PLUS) w/Device KIT [id number]  Use to check blood sugar up to x 2 daily [name], DO  Active Pharmacy Records  Dulaglutide (TRULICITY) 1.5 OV/5.6EP [facility] [id number] Yes Inject 1.5 mg into the skin once a week. [name], DO Taking Active Pharmacy Records           Med Note [facility], [facility] A   Thu Nov 19, 2018  2:04 PM) On Mondays  FLUZONE HIGH-DOSE QUADRIVALENT 0.7 ML SUSY [id number]   [provider]  Active   lisinopril-hydrochlorothiazide (ZESTORETIC) 20-12.5 MG tablet [id number] Yes Take 0.5 tablets by mouth daily. [name], DO Taking Active   metFORMIN  (GLUCOPHAGE) 1000 MG tablet [id number] Yes TAKE 1 TABLET(1000 MG) BY MOUTH TWICE DAILY WITH A MEAL [name], [name], DO Taking Active   pravastatin (PRAVACHOL) 40 MG tablet [id number] Yes Take 1 tablet (40 mg total) by mouth daily. [name], DO Taking Active   Selenium Sulfide 2.25 % SHAM [id number]  APPLY EXTERNALLY TO THE AFFECTED AREA DAILY AS NEEDED FOR IRRITATION [employer] [name], DO  Active   sildenafil (REVATIO) 20 MG tablet [id number]  Take 2-5 pills about 30 min prior to sex. [name], DO  Active             Pertinent Labs:  Lab Results  Component Value Date   HGBA1C 6.4 (H) 11/27/2020   Lab Results  Component Value Date   CHOL 213 (H) 11/27/2020   HDL 98 11/27/2020   LDLCALC 96 11/27/2020   TRIG 95 11/27/2020   CHOLHDL 2.2 11/27/2020   Lab Results  Component Value Date   CREATININE 1.06 11/27/2020   BUN 13 11/27/2020   NA 139 11/27/2020   K 3.9 11/27/2020   CL 105 11/27/2020   CO2 24 11/27/2020    SDOH:  (Social Determinants of Health) assessments and interventions performed:    [place]  Review of patient past medical history, allergies, medications, health status, including review of consultants reports, laboratory and other test data, was performed as part of comprehensive evaluation and provision of chronic care management services.   Care Plan : PharmD - Med  Mgmt  Updates made by Rennis Petty, RPH-CPP since 02/12/2021 12:00 AM     Problem: Disease Progression      Long-Range Goal: Disease Progression Prevented or Minimized   Start Date: 03/13/2020  Expected End Date: 06/11/2020  This Visit's Progress: On track  Recent Progress: On track  Priority: High  Note:   Current Barriers:  Financial Barriers Patient APPROVED to received Trulicity through [place] patient assistance program through 01/20/2021 Knowledge deficits related to nutrition with diabetes  Pharmacist Clinical Goal(s):  Over the  next 90 days, patient will maintain control of blood sugar as evidenced by A1C <7%  through collaboration with PharmD and provider.   Interventions: 1:1 collaboration with [name], DO regarding development and update of comprehensive plan of care as evidenced by provider attestation and co-signature Inter-disciplinary care team collaboration (see longitudinal plan of care) Perform chart review. Patient seen for Office Visit with [facility] on 12/27 for post-op follow up  Hyperlipidemia: Uncontrolled based on latest LDL result; current treatment: pravastatin 40 mg daily (restarted current dose ~11/26/2020) Discussed importance of lipid control for ASCVD risk reduction Counseled patient on lifestyle changes to help reduce LDL, including increasing exercise and having a well-balanced diet, particularly limiting saturated and trans fats. Exercise: reports stopped going to the gym due to reduce risk of exposure to COVID-19 virus.  Discuss option of walking outdoors for exercise  Diabetes: Controlled; current treatment: Metformin 1000 mg twice daily Trulicity 1.5 mg weekly Recent glucose readings ranging: 120s-130s Have counseled on impact of diet and exercise on blood sugar Counseled on importance of having regular well-balanced meals and limiting carbohydrate portion sizes Have encouraged patient to work towards an exercise goal of 30 minutes x 5 days/week  Hypertension: Current treatment: lisinopril/HCTZ 20-12.5 mg - 1/2 tablet daily Denies checking home blood pressure recently Identifies need for refill of lisinopril/HCTZ Rx Patient will contact pharmacy Have counseled on impact of salt/sodium on blood pressure and encouraged patient to review nutrition labels for sodium content Encourage patient to monitor BP outside office, bring log to medical appointments and if persistently >140/90 or new symptoms notify office sooner   Medication Adherence Reports  not using weekly pillbox this week Encourage patient to restart using weekly pillbox  Tobacco Use: Reports continues to refrain from smoking. Denies smoking since prior to 12/20/2020 Previously reported smoking occasionally (<1 cigarette/day) Congratulate patient  Medication Assistance: Reports 1 month supply of Trulicity from assistance program remaining Collaborating with [place] [employer] for aid to patient with re-enrollment in [place] patient assistance program for 2023 calendar year Receive message from [place] Simcox advising she spoke with patient on 1/19 to address his question regarding patient assistance renewal application Patient reports mailed his portion of application back to [place] last week   Patient Goals/Self-Care Activities Over the next 90 days, patient will:  Self administers all medications as prescribed Using weekly pillbox as adherence tool Check blood sugars and keep log of results Contact pharmacy to order refills of medication Call provider office for new concerns or questions Attend medical appointment as scheduled Next appointment with PCP on 2/21  Follow Up Plan: Telephone follow up appointment with care management team member scheduled for: 03/07/2021 at 1:45 pm       Wallace Cullens, PharmD, BCACP, [place] [phone]

## 2021-02-12 NOTE — Patient Instructions (Signed)
Visit Information  Thank you for taking time to visit with me today. Please don't hesitate to contact me if I can be of assistance to you before our next scheduled telephone appointment.  Following are the goals we discussed today:   Goals Addressed             This Visit's Progress    Pharmacy Goals       Our goal A1c is less than 7%. This corresponds with fasting sugars less than 130 and 2 hour after meal sugars less than 180. Please check your blood sugar and keep a record  Please check your home blood pressure, keep a log of the results and bring this with you to your medical appointments.  Our goal bad cholesterol, or LDL, is less than 70 . This is why it is important to continue taking your pravastatin 40 mg daily  Please use your weekly pillbox to organize your medications  Feel free to call me with any questions or concerns. I look forward to our next call!    Estelle Grumbles, PharmD, BCACP Clinical Pharmacist [facility] [phone]          Our next appointment is by telephone on 03/07/2021 at 1:45 pm  Please call the care guide team at [phone] if you need to cancel or reschedule your appointment.    The patient verbalized understanding of instructions, educational materials, and care plan provided today and declined offer to receive copy of patient instructions, educational materials, and care plan.

## 2021-02-20 ENCOUNTER — Telehealth: Payer: Self-pay | Admitting: Pharmacy Technician

## 2021-02-20 DIAGNOSIS — E785 Hyperlipidemia, unspecified: Secondary | ICD-10-CM | POA: Diagnosis not present

## 2021-02-20 DIAGNOSIS — Z596 Low income: Secondary | ICD-10-CM

## 2021-02-20 DIAGNOSIS — I10 Essential (primary) hypertension: Secondary | ICD-10-CM | POA: Diagnosis not present

## 2021-02-20 DIAGNOSIS — E1169 Type 2 diabetes mellitus with other specified complication: Secondary | ICD-10-CM

## 2021-02-20 NOTE — Progress Notes (Signed)
[facility] Customer service manager [facility])                                            [facility] Quality Pharmacy Team    02/20/2021  [name]. 06-Mar-1945 [id number]  Received both patient and provider portion(s) of patient assistance application(s) for Trulicity. Faxed completed application and required documents into [name].    Larinda Herter P. Mellanie Bejarano, CPhT [facility] [employer]  [phone]

## 2021-02-28 ENCOUNTER — Ambulatory Visit: Payer: Medicare HMO | Admitting: Family Medicine

## 2021-03-01 ENCOUNTER — Ambulatory Visit (INDEPENDENT_AMBULATORY_CARE_PROVIDER_SITE_OTHER): Payer: Medicare HMO | Admitting: Family Medicine

## 2021-03-01 ENCOUNTER — Encounter: Payer: Self-pay | Admitting: Family Medicine

## 2021-03-01 ENCOUNTER — Other Ambulatory Visit: Payer: Self-pay

## 2021-03-01 VITALS — BP 138/75 | HR 72 | Ht 71.5 in | Wt 193.4 lb

## 2021-03-01 DIAGNOSIS — L739 Follicular disorder, unspecified: Secondary | ICD-10-CM | POA: Diagnosis not present

## 2021-03-01 DIAGNOSIS — E1169 Type 2 diabetes mellitus with other specified complication: Secondary | ICD-10-CM | POA: Diagnosis not present

## 2021-03-01 DIAGNOSIS — I10 Essential (primary) hypertension: Secondary | ICD-10-CM

## 2021-03-01 LAB — POCT GLYCOSYLATED HEMOGLOBIN (HGB A1C): Hemoglobin A1C: 6.8 % — AB (ref 4.0–5.6)

## 2021-03-01 MED ORDER — LISINOPRIL-HYDROCHLOROTHIAZIDE 20-12.5 MG PO TABS: 1.0000 | ORAL_TABLET | Freq: Every day | ORAL | 3 refills | Status: DC

## 2021-03-01 MED ORDER — MUPIROCIN 2 % EX OINT: 1.0000 "application " | TOPICAL_OINTMENT | Freq: Two times a day (BID) | CUTANEOUS | 1 refills | Status: AC

## 2021-03-01 NOTE — Progress Notes (Signed)
Subjective:    Patient ID: [name]., male    DOB: Sep 08, 1945, 76 y.o.   MRN: [id number]  Delos Beyler. is a 76 y.o. male presenting on 03/01/2021 for Diabetes   HPI  CHRONIC DM, Type 2 w/ Neuropathy See prior notes for background info - Today patient reports he is doing very well overall. No new major concerns. No further significant hypoglycemia readings.  Last A1c 6.4 (12/04/2020) - now due today for repeat A1c, before DOT eval in Feb - Trulicity 1.5mg  weekly injection (on PAP for financial support, app this year submitted) - Metformin 1000mg  BID - OFF GLipizide Reports good compliance. Tolerating well w/o side-effects Currently on ACEi Lifestyle: Weight stable to +1 lb in 3 months - Diet (improve diet) - Exercise (Limited exercise due to time - gradually improving) - Dr [name] last DM Eye exam 11/15/20 no DM Retinopathy Denies hypoglycemia, polyuria, visual changes, numbness or tingling.   CHRONIC HTN: Mild elevated BP, not checking as often, needs better control for DOT Current Meds - Lisinopril-HCTZ 20-12.5mg  - HALF pill daily Reports good compliance, took meds today. Tolerating well, w/o complaints. Admits can have episodic headache flare with BP elevated. Denies CP, dyspnea, HA, edema, dizziness / lightheadedness   Folliculitis, face Has boil from shaving on left lower chin. Healing.  Sebaceous Cyst removed on back, by Gen Surgery - healing well   Depression screen [facility] 2/9 07/11/2020 05/29/2020 12/14/2019  Decreased Interest 0 0 0  Down, Depressed, Hopeless 0 0 0  PHQ - 2 Score 0 0 0  Altered sleeping - 0 -  Tired, decreased energy - 0 -  Change in appetite - 0 -  Feeling bad or failure about yourself  - 0 -  Trouble concentrating - 0 -  Moving slowly or fidgety/restless - 0 -  Suicidal thoughts - 0 -  PHQ-9 Score - 0 -  Difficult doing work/chores - Not difficult at all -    Social History   Tobacco Use   Smoking status: Some Days    Types:  Cigarettes   Smokeless tobacco: Current   Tobacco comments:    pack of cigarettes last 1-2 months - doesnt inhale  Vaping Use   Vaping Use: Never used  Substance Use Topics   Alcohol use: Yes    Alcohol/week: 1.0 standard drink    Types: 1 Cans of beer per week    Comment: 1-2 beers week    Drug use: No    Review of Systems Per HPI unless specifically indicated above     Objective:    BP 138/75 (BP Location: Left Arm, Cuff Size: Normal)    Pulse 72    Ht 5' 11.5" (1.816 m)    Wt 193 lb 6.4 oz (87.7 kg)    SpO2 95%    BMI 26.60 kg/m   Wt Readings from Last 3 Encounters:  03/01/21 193 lb 6.4 oz (87.7 kg)  01/16/21 191 lb 6.4 oz (86.8 kg)  12/28/20 192 lb 3.2 oz (87.2 kg)    Physical Exam Vitals and nursing note reviewed.  Constitutional:      General: He is not in acute distress.    Appearance: He is well-developed. He is not diaphoretic.     Comments: Well-appearing, comfortable, cooperative  HENT:     Head: Normocephalic and atraumatic.  Eyes:     General:        Right eye: No discharge.        Left eye:  No discharge.     Conjunctiva/sclera: Conjunctivae normal.  Neck:     Thyroid: No thyromegaly.  Cardiovascular:     Rate and Rhythm: Normal rate and regular rhythm.     Pulses: Normal pulses.     Heart sounds: Normal heart sounds. No murmur heard. Pulmonary:     Effort: Pulmonary effort is normal. No respiratory distress.     Breath sounds: Normal breath sounds. No wheezing or rales.  Musculoskeletal:        General: Normal range of motion.     Cervical back: Normal range of motion and neck supple.  Lymphadenopathy:     Cervical: No cervical adenopathy.  Skin:    General: Skin is warm and dry.     Findings: Lesion (s/p excised sebaceous cyst on midline back over spine, resolved, well healed small scar tissue only) present. No erythema or rash.     Comments: Left lower chin/beard area with folliculitis  Neurological:     Mental Status: He is alert and  oriented to person, place, and time. Mental status is at baseline.  Psychiatric:        Behavior: Behavior normal.     Comments: Well groomed, good eye contact, normal speech and thoughts    Recent Labs    05/29/20 1103 11/27/20 1314 03/01/21 0941  HGBA1C 6.7* 6.4* 6.8*     Results for orders placed or performed in visit on 03/01/21  POCT HgB A1C  Result Value Ref Range   Hemoglobin A1C 6.8 (A) 4.0 - 5.6 %      Assessment & Plan:   Problem List Items Addressed This Visit     Type 2 diabetes mellitus with other specified complication ([place]) - Primary    Well-controlled DM with A1c at 6.8 (inc from last time but still at goal), on lifestyle and GLP1 No hypoglycemia Complications - DM retinopathy hyperlipidemia- increases risk of future cardiovascular complications  - OFF Glipizide  Plan:  1. Continue current therapy - Trulicity 1.5mg  weekly (PAP), Metformin 1000mg  BID 2. Encourage improved lifestyle - low carb, low sugar diet, reduce portion size, continue improving regular exercise 3. Check CBG , bring log to next visit for review 4. Continue ACEi, Statin      Relevant Medications   lisinopril-hydrochlorothiazide (ZESTORETIC) 20-12.5 MG tablet   Other Relevant Orders   POCT HgB A1C (Completed)   Essential hypertension    Mild elevated SBP 145, due for upcoming DOT Not checking regularly No known complications    Plan:  1. Inc back to whole tab Lisinopril HCTZ 20-12.5mg  daily new rx - Goal to limit meds improve ED - this has already improved on lower dose 2. Encourage improved lifestyle - low sodium diet, regular exercise 3. Continue monitor BP outside office, bring readings to next visit, if persistently >140/90 or new symptoms notify office sooner      Relevant Medications   lisinopril-hydrochlorothiazide (ZESTORETIC) 20-12.5 MG tablet   Other Visit Diagnoses     Folliculitis       Relevant Medications   mupirocin ointment (BACTROBAN) 2 %        Folliculitis on chin/beard from shaving likely Localized only  Mupirocin BID x 2 weeks  S/p Seb Cyst excised on back looks like it healed great  Meds ordered this encounter  Medications   mupirocin ointment (BACTROBAN) 2 %    Sig: Apply 1 application topically 2 (two) times daily. For 1 week as needed for folliculitis infection    Dispense:  30 g    Refill:  1   lisinopril-hydrochlorothiazide (ZESTORETIC) 20-12.5 MG tablet    Sig: Take 1 tablet by mouth daily.    Dispense:  90 tablet    Refill:  3      Follow up plan: Return in about 5 months (around 07/29/2021) for 5 month follow-up DM A1c, HTN.   Nobie Putnam, [facility] 03/01/2021, 9:43 AM

## 2021-03-01 NOTE — Assessment & Plan Note (Signed)
Well-controlled DM with A1c at 6.8 (inc from last time but still at goal), on lifestyle and GLP1 No hypoglycemia Complications - DM retinopathy hyperlipidemia- increases risk of future cardiovascular complications  - OFF Glipizide  Plan:  1. Continue current therapy - Trulicity 1.5mg  weekly (PAP), Metformin 1000mg  BID 2. Encourage improved lifestyle - low carb, low sugar diet, reduce portion size, continue improving regular exercise 3. Check CBG , bring log to next visit for review 4. Continue ACEi, Statin

## 2021-03-01 NOTE — Assessment & Plan Note (Signed)
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 Inc back to whole tab Lisinopril HCTZ 20-12.5mg  daily new rx - Goal to limit meds improve ED - this has already improved on lower dose 2. Encourage improved lifestyle - low sodium diet, regular exercise 3. Continue monitor BP outside office, bring readings to next visit, if persistently >140/90 or new symptoms notify office sooner

## 2021-03-01 NOTE — Patient Instructions (Addendum)
Thank you for coming to the office today.  Use the antibiotic cream on the face spot, twice a day for 2 weeks. Until healed.  Back cyst removed looks great.  Keep up the great work.  Recent Labs    05/29/20 1103 11/27/20 1314 03/01/21 0941  HGBA1C 6.7* 6.4* 6.8*    Increase to whole pill BP medication, new order in  Please schedule a Follow-up Appointment to: Return in about 5 months (around 07/29/2021) for 5 month follow-up DM A1c, HTN.  If you have any other questions or concerns, please feel free to call the office or send a message through [place]. You may also schedule an earlier appointment if necessary.  Additionally, you may be receiving a survey about your experience at our office within a few days to 1 week by e-mail or mail. We value your feedback.  Nobie Putnam, DO [place]

## 2021-03-05 ENCOUNTER — Ambulatory Visit: Payer: Medicare HMO | Admitting: Family Medicine

## 2021-03-07 ENCOUNTER — Ambulatory Visit (INDEPENDENT_AMBULATORY_CARE_PROVIDER_SITE_OTHER): Payer: Medicare HMO | Admitting: Pharmacist

## 2021-03-07 DIAGNOSIS — E1169 Type 2 diabetes mellitus with other specified complication: Secondary | ICD-10-CM

## 2021-03-07 DIAGNOSIS — I10 Essential (primary) hypertension: Secondary | ICD-10-CM

## 2021-03-07 NOTE — Chronic Care Management (AMB) (Signed)
Chronic Care Management CCM Pharmacy Note  03/07/2021 Name:  Caswell Alvillar. MRN:  [id number] DOB:  [birth year]   Subjective: Chanze Teagle. is an 76 y.o. year old male who is a primary patient of [name], DO.  The CCM team was consulted for assistance with disease management and care coordination needs.    Engaged with patient by telephone for follow up visit for pharmacy case management and/or care coordination services.   Objective:  Medications Reviewed Today     Reviewed by Rennis Petty, RPH-CPP (Pharmacist) on 03/07/21 at 1418  Med List Status: <None>   Medication Order Taking? Sig Documenting Provider Last Dose Status Informant  ACCU-CHEK AVIVA PLUS test strip [id number]  Check sugar up to 2 x daily [name], DO  Active Pharmacy Records  Accu-Chek FastClix Lancets MISC [id number]  Check sugar up to 2 x daily [name], DO  Active Pharmacy Records  Accu-Chek Softclix Lancets lancets [id number]  Check sugar up to 2 x daily [name], DO  Active Pharmacy Records  acetaminophen (TYLENOL) 325 MG tablet [id number]  Take 2 tablets (650 mg total) by mouth every 6 (six) hours as needed for mild pain or headache (fever >/= 101). [name], MD  Active   Blood Glucose Monitoring Suppl (ACCU-CHEK AVIVA PLUS) w/Device KIT [id number]  Use to check blood sugar up to x 2 daily [name], DO  Active Pharmacy Records  Dulaglutide (TRULICITY) 1.5 CH/8.8FO [facility] [id number] Yes Inject 1.5 mg into the skin once a week. [name], DO Taking Active Pharmacy Records           Med Note [facility] A   Thu Nov 19, 2018  2:04 PM) On Mondays  lisinopril-hydrochlorothiazide (ZESTORETIC) 20-12.5 MG tablet [id number] Yes Take 1 tablet by mouth daily. [name], DO Taking Active   metFORMIN (GLUCOPHAGE) 1000 MG tablet [id number] Yes TAKE 1 TABLET(1000 MG) BY MOUTH TWICE DAILY WITH A MEAL  [name], [name], DO Taking Active   mupirocin ointment (BACTROBAN) 2 % [id number] Yes Apply 1 application topically 2 (two) times daily. For 1 week as needed for folliculitis infection [name], DO Taking Active   pravastatin (PRAVACHOL) 40 MG tablet [id number] Yes Take 1 tablet (40 mg total) by mouth daily. [name], DO Taking Active   Selenium Sulfide 2.25 % SHAM [id number]  APPLY EXTERNALLY TO THE AFFECTED AREA DAILY AS NEEDED FOR IRRITATION [place] [name], DO  Active   sildenafil (REVATIO) 20 MG tablet [id number]  Take 2-5 pills about 30 min prior to sex. [name], DO  Active             Pertinent Labs:  Lab Results  Component Value Date   HGBA1C 6.8 (A) 03/01/2021   Lab Results  Component Value Date   CHOL 213 (H) 11/27/2020   HDL 98 11/27/2020   LDLCALC 96 11/27/2020   TRIG 95 11/27/2020   CHOLHDL 2.2 11/27/2020   Lab Results  Component Value Date   CREATININE 1.06 11/27/2020   BUN 13 11/27/2020   NA 139 11/27/2020   K 3.9 11/27/2020   CL 105 11/27/2020   CO2 24 11/27/2020    SDOH:  (Social Determinants of Health) assessments and interventions performed:    [place]  Review of patient past medical history, allergies, medications, health status, including review of consultants reports, laboratory and other test data, was performed as part of comprehensive evaluation  and provision of chronic care management services.   Care Plan : PharmD - Med Mgmt  Updates made by Rennis Petty, RPH-CPP since 03/07/2021 12:00 AM     Problem: Disease Progression      Long-Range Goal: Disease Progression Prevented or Minimized   Start Date: 03/13/2020  Expected End Date: 06/11/2020  This Visit's Progress: On track  Recent Progress: On track  Priority: High  Note:   Current Barriers:  Financial Barriers Patient APPROVED to received Trulicity through [place] patient assistance program through  01/20/2021 Knowledge deficits related to nutrition with diabetes  Pharmacist Clinical Goal(s):  Over the next 90 days, patient will maintain control of blood sugar as evidenced by A1C <7%  through collaboration with PharmD and provider.   Interventions: 1:1 collaboration with [name], DO regarding development and update of comprehensive plan of care as evidenced by provider attestation and co-signature Inter-disciplinary care team collaboration (see longitudinal plan of care) Perform chart review. Patient seen for Office Visit with PCP on 2/9. Provider advised patient: Use the antibiotic cream on the face spot, twice a day for 2 weeks. Until healed. Increase lisinopril-hydrochlorothiazide 20-12.5 mg to 1 full tablet daily Today patient reports using antibiotic cream as directed and area on face improving  Medication Assistance: Collaborating with [place] Simcox for aid to patient with re-enrollment in [place] patient assistance program for 2023 calendar year From review of notes from [place] Simcox, note she faxed application to assistance program on 1/31 and then called to follow up with program on 2/13 and was advised application received but still in processing at that time Today patient reports he has 1 pen of Trulicity from assistance program remaining (due to inject on Monday, 2/20) Advise patient to call to follow up with [name] patient assistance program today [phone]) to let program know that he only has 1 pen remaining and request shipment Patient to follow up with CM Pharmacist if needed for further assistance  Diabetes: Controlled; current treatment: Metformin 1000 mg twice daily Trulicity 1.5 mg weekly Recent fasting blood sugar readings ranging: 103-115 Have counseled on impact of diet and exercise on blood sugar Counsel on importance of having regular well-balanced meals and limiting carbohydrate portion sizes Have encouraged patient to  work towards an exercise goal of 30 minutes x 5 days/week  Hypertension: Current treatment: lisinopril/HCTZ 20-12.5 mg - 1 tablet daily Have counseled on impact of salt/sodium on blood pressure and encouraged patient to review nutrition labels for sodium content Denies symptoms of hypotension or hypertension Encourage patient to monitor BP outside office, bring log to medical appointments and if persistently >140/90 or new symptoms notify office sooner   Medication Adherence Reports restarted using weekly pillbox; denies missed doses   Hyperlipidemia: Uncontrolled based on latest LDL result; current treatment: pravastatin 40 mg daily (restarted current dose ~11/26/2020) Discussed importance of lipid control for ASCVD risk reduction Counseled patient on lifestyle changes to help reduce LDL, including increasing exercise and having a well-balanced diet, particularly limiting saturated and trans fats. Exercise: reports stopped going to the gym due to reduce risk of exposure to COVID-19 virus.  Discuss option of walking outdoors for exercise  Patient Goals/Self-Care Activities Over the next 90 days, patient will:  Self administers all medications as prescribed Using weekly pillbox as adherence tool Check blood sugars and keep log of results Contact pharmacy to order refills of medication Call provider office for new concerns or questions Attend medical appointment as scheduled Next appointment  with PCP on 2/21  Follow Up Plan: Telephone follow up appointment with care management team member scheduled for: 05/02/2021 at 1:45 PM       Wallace Cullens, PharmD, [place], [place] [phone]

## 2021-03-07 NOTE — Patient Instructions (Signed)
Visit Information  Thank you for taking time to visit with me today. Please don't hesitate to contact me if I can be of assistance to you before our next scheduled telephone appointment.  Following are the goals we discussed today:   Goals Addressed             This Visit's Progress    Pharmacy Goals       Our goal A1c is less than 7%. This corresponds with fasting sugars less than 130 and 2 hour after meal sugars less than 180. Please check your blood sugar and keep a record  Please check your home blood pressure, keep a log of the results and bring this with you to your medical appointments.  Our goal bad cholesterol, or LDL, is less than 70 . This is why it is important to continue taking your pravastatin 40 mg daily  Please use your weekly pillbox to organize your medications  Feel free to call me with any questions or concerns. I look forward to our next call!   Estelle Grumbles, PharmD, BCACP Clinical Pharmacist [facility] [phone]          Our next appointment is by telephone on 05/02/2021 at 1:45 PM  Please call the care guide team at [phone] if you need to cancel or reschedule your appointment.    Patient verbalizes understanding of instructions and care plan provided today and agrees to view in MyChart. Active MyChart status confirmed with patient.

## 2021-03-12 ENCOUNTER — Telehealth: Payer: Self-pay | Admitting: Pharmacy Technician

## 2021-03-12 DIAGNOSIS — Z596 Low income: Secondary | ICD-10-CM

## 2021-03-12 NOTE — Progress Notes (Signed)
[facility] Customer service manager [facility])                                            [facility] Quality Pharmacy Team    03/12/2021  [name]. Apr 11, 1945 [id number]  Care coordination call placed to [name] in regard to Trulicity application.  Spoke to [place] who informs patient is APPROVED 03/08/21-01/20/22. She informs medication will ship based on last fill date in 2022 and going forward in 2023 with delivery to the patient's home. Patient can always call [name] at [phone] to check on shipment status or [facility], which is the pharmacy [name] uses to process and deliver medications, by calling [phone].  Brooklynne Pereida P. Makalia Bare, CPhT [facility] [employer]  [phone]

## 2021-03-13 ENCOUNTER — Ambulatory Visit: Payer: Medicare HMO | Admitting: Family Medicine

## 2021-03-20 DIAGNOSIS — I10 Essential (primary) hypertension: Secondary | ICD-10-CM | POA: Diagnosis not present

## 2021-03-20 DIAGNOSIS — E785 Hyperlipidemia, unspecified: Secondary | ICD-10-CM | POA: Diagnosis not present

## 2021-03-20 DIAGNOSIS — E1169 Type 2 diabetes mellitus with other specified complication: Secondary | ICD-10-CM | POA: Diagnosis not present

## 2021-03-21 ENCOUNTER — Ambulatory Visit (INDEPENDENT_AMBULATORY_CARE_PROVIDER_SITE_OTHER): Payer: Medicare HMO | Admitting: Pharmacist

## 2021-03-21 DIAGNOSIS — E113293 Type 2 diabetes mellitus with mild nonproliferative diabetic retinopathy without macular edema, bilateral: Secondary | ICD-10-CM

## 2021-03-21 MED ORDER — TRULICITY 1.5 MG/0.5ML ~~LOC~~ SOAJ: 1.5000 mg | SUBCUTANEOUS | 0 refills | Status: DC

## 2021-03-21 NOTE — Chronic Care Management (AMB) (Signed)
? ?Chronic Care Management ?CCM Pharmacy Note ? ?03/21/2021 ?Name:  [name]. MRN:  [id number] DOB:  Sep 20, 1945 ? ? ?Subjective: ?[name]. is an 76 y.o. year old male who is a primary patient of [name], DO.  The CCM team was consulted for assistance with disease management and care coordination needs.   ? ?Receive a message from [place] Simcox regarding patient's medication assistance ? ?Engaged with patient by telephone for follow up visit for pharmacy case management and/or care coordination services.  ? ?Objective: ? ?Medications Reviewed Today   ? ? Reviewed by Rennis Petty, RPH-CPP (Pharmacist) on 03/07/21 at 1418  Med List Status: <None>  ? ?Medication Order Taking? Sig Documenting Provider Last Dose Status Informant  ?ACCU-CHEK AVIVA PLUS test strip [id number]  Check sugar up to 2 x daily [name], DO  Active Pharmacy Records  ?Accu-Chek FastClix Lancets MISC [id number]  Check sugar up to 2 x daily [name], DO  Active Pharmacy Records  ?Accu-Chek Softclix Lancets lancets [id number]  Check sugar up to 2 x daily [name], DO  Active Pharmacy Records  ?acetaminophen (TYLENOL) 325 MG tablet [id number]  Take 2 tablets (650 mg total) by mouth every 6 (six) hours as needed for mild pain or headache (fever >/= 101). [name], MD  Active   ?Blood Glucose Monitoring Suppl (ACCU-CHEK AVIVA PLUS) w/Device KIT [id number]  Use to check blood sugar up to x 2 daily [place] [name], DO  Active Pharmacy Records  ?Dulaglutide (TRULICITY) 1.5 OV/5.6EP SOPN [id number] Yes Inject 1.5 mg into the skin once a week. [name], DO Taking Active Pharmacy Records  ?         ?Med Note [name], Aaminah Forrester A   Thu Nov 19, 2018  2:04 PM) On Mondays  ?lisinopril-hydrochlorothiazide (ZESTORETIC) 20-12.5 MG tablet [id number] Yes Take 1 tablet by mouth daily. [name], DO Taking Active   ?metFORMIN (GLUCOPHAGE)  1000 MG tablet [id number] Yes TAKE 1 TABLET(1000 MG) BY MOUTH TWICE DAILY WITH A MEAL [name], [name], DO Taking Active   ?mupirocin ointment (BACTROBAN) 2 % [id number] Yes Apply 1 application topically 2 (two) times daily. For 1 week as needed for folliculitis infection [name], DO Taking Active   ?pravastatin (PRAVACHOL) 40 MG tablet [id number] Yes Take 1 tablet (40 mg total) by mouth daily. [name], DO Taking Active   ?Selenium Sulfide 2.25 % SHAM [id number]  APPLY EXTERNALLY TO THE AFFECTED AREA DAILY AS NEEDED FOR IRRITATION [name], DO  Active   ?sildenafil (REVATIO) 20 MG tablet [id number]  Take 2-5 pills about 30 min prior to sex. [name], DO  Active   ? ?  ?  ? ?  ? ? ?Pertinent Labs:  ?Lab Results  ?Component Value Date  ? HGBA1C 6.8 (A) 03/01/2021  ? ? ?Lab Results  ?Component Value Date  ? CREATININE 1.06 11/27/2020  ? BUN 13 11/27/2020  ? NA 139 11/27/2020  ? K 3.9 11/27/2020  ? CL 105 11/27/2020  ? CO2 24 11/27/2020  ? ? ?SDOH:  (Social Determinants of Health) assessments and interventions performed:  ? ? ?CCM Care Plan ? ?Review of patient past medical history, allergies, medications, health status, including review of consultants reports, laboratory and other test data, was performed as part of comprehensive evaluation and provision of chronic care management services.  ? ?Care Plan : PharmD - Med Mgmt  ?Updates made by  Akemi Overholser, Virl Diamond, RPH-CPP since 03/21/2021 12:00 AM  ?  ? ?Problem: Disease Progression   ?  ? ?Long-Range Goal: Disease Progression Prevented or Minimized   ?Start Date: 03/13/2020  ?Expected End Date: 06/11/2020  ?Recent Progress: On track  ?Priority: High  ?Note:   ?Current Barriers:  ?Financial Barriers ?Patient APPROVED to received Trulicity through [place] patient assistance program through 01/20/2021 ?Knowledge deficits related to nutrition with diabetes ? ?Pharmacist Clinical Goal(s):  ?Over the next 90 days,  patient will maintain control of blood sugar as evidenced by A1C <7%  through collaboration with PharmD and provider.  ? ?Interventions: ?1:1 collaboration with [name], DO regarding development and update of comprehensive plan of care as evidenced by provider attestation and co-signature ?Inter-disciplinary care team collaboration (see longitudinal plan of care) ?Receive a message from [place] Simcox regarding patient's medication assistance ? ?Medication Assistance: ?Collaborating with [place] Simcox for aid to patient with re-enrollment in [place] patient assistance program for 2023 calendar year ?Receive message from [place] today.  ?Patient reports used last Trulicity pen from assistance program on 2/20 (did not have pen for injection due on 2/27) ?Offer to send 30-day supply Rx for Trulicity to local pharmacy for patient. Patient agreeable to pick up this 1 month supply, paying copayment thorough his [employer] Part D plan to restart Trulicity today, while waiting on supply from assistance program ?CPP contacts [place] to confirm medication in stock and sends Rx to pharmacy ? ?Diabetes: ?Controlled; current treatment: ?Metformin 1000 mg twice daily ?Trulicity 1.5 mg weekly ?Have counseled on impact of diet and exercise on blood sugar ?Counsel on importance of having regular well-balanced meals and limiting carbohydrate portion sizes ?Have encouraged patient to work towards an exercise goal of 30 minutes x 5 days/week ? ? ?Patient Goals/Self-Care Activities ?Over the next 90 days, patient will:  ?Self administers all medications as prescribed ?Using weekly pillbox as adherence tool ?Check blood sugars and keep log of results ?Contact pharmacy to order refills of medication ?Call provider office for new concerns or questions ?Attend medical appointment as scheduled ? ?Follow Up Plan: Telephone follow up appointment with care management team member scheduled for: 05/02/2021 at  1:45 PM ? ?  ?  ? ?Wallace Cullens, PharmD, BCACP, CPP ?Clinical Pharmacist ?[address] [facility] ?[place] ?[phone] ? ? ? ? ? ?

## 2021-03-21 NOTE — Patient Instructions (Signed)
Visit Information ? ?Thank you for taking time to visit with me today. Please don't hesitate to contact me if I can be of assistance to you before our next scheduled telephone appointment. ? ?Following are the goals we discussed today:  ? Goals Addressed   ? ?  ?  ?  ?  ? This Visit's Progress  ?  Pharmacy Goals     ?  Our goal A1c is less than 7%. This corresponds with fasting sugars less than 130 and 2 hour after meal sugars less than 180. Please check your blood sugar and keep a record ? ?Please check your home blood pressure, keep a log of the results and bring this with you to your medical appointments. ? ?Our goal bad cholesterol, or LDL, is less than 70 . This is why it is important to continue taking your pravastatin 40 mg daily ? ?Please use your weekly pillbox to organize your medications ? ?Feel free to call me with any questions or concerns. I look forward to our next call! ? ? ? ?Estelle Grumbles, PharmD, BCACP ?Clinical Pharmacist ?[facility] ?[place] ?[phone] ? ?  ? ?  ? ? ? ?Our next appointment is by telephone on 05/02/2021 at 1:45 PM ? ?Please call the care guide team at [phone] if you need to cancel or reschedule your appointment.  ? ? ?Patient verbalizes understanding of instructions and care plan provided today and agrees to view in MyChart. Active MyChart status confirmed with patient.   ? ?

## 2021-04-20 DIAGNOSIS — E1169 Type 2 diabetes mellitus with other specified complication: Secondary | ICD-10-CM

## 2021-04-20 DIAGNOSIS — Z7984 Long term (current) use of oral hypoglycemic drugs: Secondary | ICD-10-CM

## 2021-05-02 ENCOUNTER — Telehealth: Payer: Self-pay | Admitting: Pharmacist

## 2021-05-02 ENCOUNTER — Telehealth: Payer: Medicare HMO

## 2021-05-02 NOTE — Telephone Encounter (Signed)
?  Chronic Care Management  ? ?Outreach Note ? ?05/02/2021 ?Name: [name]. MRN: [id number] DOB: Jun 16, 1945 ? ?Referred by: [name], DO ?Reason for referral : No chief complaint on file. ? ? ?Was unable to reach patient via telephone today and have left HIPAA compliant voicemail asking patient to return my call. ? ? ?Follow Up Plan: Will collaborate with Care Guide to outreach to schedule follow up with me ? ?Estelle Grumbles, PharmD, BCACP ?Clinical Pharmacist ?[facility] ?[phone] ? ?

## 2021-05-28 ENCOUNTER — Telehealth: Payer: Self-pay | Admitting: Pharmacist

## 2021-05-28 ENCOUNTER — Telehealth: Payer: Medicare HMO

## 2021-05-28 NOTE — Telephone Encounter (Signed)
?  Chronic Care Management  ? ?Outreach Note ? ?05/28/2021 ?Name: [name]. MRN: [id number] DOB: 07-13-1945 ? ?Referred by: [name], DO ?Reason for referral : No chief complaint on file. ? ? ?Was unable to reach patient via telephone today and have left HIPAA compliant voicemail asking patient to return my call. Outreach attempt #2. ? ? ?Follow Up Plan: Will collaborate with Care Guide to outreach to schedule follow up with me ? ?Wallace Cullens, PharmD, BCACP ?Clinical Pharmacist ?[place] [facility] ?[phone] ? ?

## 2021-06-05 ENCOUNTER — Ambulatory Visit: Payer: Medicare HMO

## 2021-06-06 ENCOUNTER — Telehealth: Payer: Self-pay | Admitting: Pharmacist

## 2021-06-06 ENCOUNTER — Telehealth: Payer: Medicare HMO

## 2021-06-06 NOTE — Telephone Encounter (Signed)
?  Chronic Care Management  ? ?Outreach Note ? ?06/06/2021 ?Name: [name]. MRN: [id number] DOB: 03-Mar-1945 ? ?Referred by: [name], DO ?Reason for referral : No chief complaint on file. ? ? ?Was unable to reach patient via telephone today and have left HIPAA compliant voicemail asking patient to return my call.  ? ? ?Follow Up Plan: Will collaborate with Care Guide to outreach to schedule follow up with me ? ?Estelle Grumbles, PharmD, BCACP ?Clinical Pharmacist ?[facility] ?[phone] ? ?

## 2021-06-12 ENCOUNTER — Ambulatory Visit: Payer: Medicare HMO

## 2021-06-20 ENCOUNTER — Telehealth: Payer: Medicare HMO

## 2021-06-20 ENCOUNTER — Telehealth: Payer: Self-pay | Admitting: Pharmacist

## 2021-06-20 NOTE — Telephone Encounter (Signed)
  Chronic Care Management   Outreach Note  06/20/2021 Name: [name]. MRN: [id number] DOB: 1945-09-14  Referred by: [name], DO Reason for referral : No chief complaint on file.   Was unable to reach patient via telephone today and have left HIPAA compliant voicemail asking patient to return my call.    Follow Up Plan: Will attempt to reach patient by telephone within the next 90 days  Estelle Grumbles, PharmD, [facility] Clinical Pharmacist [facility] [phone]

## 2021-06-27 ENCOUNTER — Ambulatory Visit (INDEPENDENT_AMBULATORY_CARE_PROVIDER_SITE_OTHER): Payer: Medicare HMO | Admitting: Pharmacist

## 2021-06-27 DIAGNOSIS — I10 Essential (primary) hypertension: Secondary | ICD-10-CM

## 2021-06-27 DIAGNOSIS — E1169 Type 2 diabetes mellitus with other specified complication: Secondary | ICD-10-CM

## 2021-06-27 NOTE — Chronic Care Management (AMB) (Signed)
Chronic Care Management CCM Pharmacy Note  06/27/2021 Name:  [name]. MRN:  [id number] DOB:  1945-07-30   Subjective: [name]. is an 76 y.o. year old male who is a primary patient of [name], DO.  The CCM team was consulted for assistance with disease management and care coordination needs.    Engaged with patient by telephone for follow up visit for pharmacy case management and/or care coordination services.   Objective:  Medications Reviewed Today     Reviewed by Rennis Petty, RPH-CPP (Pharmacist) on 06/27/21 at 4  Med List Status: <None>   Medication Order Taking? Sig Documenting Provider Last Dose Status Informant  ACCU-CHEK AVIVA PLUS test strip [id number]  Check sugar up to 2 x daily [name], DO  Active Pharmacy Records  Accu-Chek FastClix Lancets MISC [id number]  Check sugar up to 2 x daily [name], DO  Active Pharmacy Records  Accu-Chek Softclix Lancets lancets [id number]  Check sugar up to 2 x daily [name], DO  Active Pharmacy Records  acetaminophen (TYLENOL) 325 MG tablet [id number]  Take 2 tablets (650 mg total) by mouth every 6 (six) hours as needed for mild pain or headache (fever >/= 101). [name], MD  Active   Blood Glucose Monitoring Suppl (ACCU-CHEK AVIVA PLUS) w/Device KIT [id number]  Use to check blood sugar up to x 2 daily [name], DO  Active Pharmacy Records  Dulaglutide (TRULICITY) 1.5 WF/0.9NA [facility] [id number] Yes Inject 1.5 mg into the skin once a week for 28 days. [name], DO Taking Active   lisinopril-hydrochlorothiazide (ZESTORETIC) 20-12.5 MG tablet [id number] Yes Take 1 tablet by mouth daily. [name], DO Taking Active   metFORMIN (GLUCOPHAGE) 1000 MG tablet [id number] Yes TAKE 1 TABLET(1000 MG) BY MOUTH TWICE DAILY WITH A MEAL [name], [name], DO Taking Active   mupirocin ointment (BACTROBAN) 2 % [id number]   Apply 1 application topically 2 (two) times daily. For 1 week as needed for folliculitis infection [name], DO  Active   pravastatin (PRAVACHOL) 40 MG tablet [id number] Yes Take 1 tablet (40 mg total) by mouth daily. [name], DO Taking Active   Selenium Sulfide 2.25 % SHAM [id number]  APPLY EXTERNALLY TO THE AFFECTED AREA DAILY AS NEEDED FOR IRRITATION [place] [employer] [name], DO  Active   sildenafil (REVATIO) 20 MG tablet [id number]  Take 2-5 pills about 30 min prior to sex. [name], DO  Active             Pertinent Labs:  Lab Results  Component Value Date   HGBA1C 6.8 (A) 03/01/2021   Lab Results  Component Value Date   CHOL 213 (H) 11/27/2020   HDL 98 11/27/2020   LDLCALC 96 11/27/2020   TRIG 95 11/27/2020   CHOLHDL 2.2 11/27/2020   Lab Results  Component Value Date   CREATININE 1.06 11/27/2020   BUN 13 11/27/2020   NA 139 11/27/2020   K 3.9 11/27/2020   CL 105 11/27/2020   CO2 24 11/27/2020    SDOH:  (Social Determinants of Health) assessments and interventions performed:    [place]  Review of patient past medical history, allergies, medications, health status, including review of consultants reports, laboratory and other test data, was performed as part of comprehensive evaluation and provision of chronic care management services.   Care Plan : PharmD - Med Mgmt  Updates made by Curley Spice Virl Diamond, RPH-CPP  since 06/27/2021 12:00 AM     Problem: Disease Progression      Long-Range Goal: Disease Progression Prevented or Minimized   Start Date: 03/13/2020  Expected End Date: 06/11/2020  Recent Progress: On track  Priority: High  Note:   Current Barriers:  Financial Barriers Patient APPROVED to received Trulicity through [place] patient assistance program through 01/20/2021 Knowledge deficits related to nutrition with diabetes  Pharmacist Clinical Goal(s):  Over the next 90 days, patient will maintain  control of blood sugar as evidenced by A1C <7%  through collaboration with PharmD and provider.   Interventions: 1:1 collaboration with [name], DO regarding development and update of comprehensive plan of care as evidenced by provider attestation and co-signature Inter-disciplinary care team collaboration (see longitudinal plan of care)  Medication Assistance: Reports received latest shipment of Trulicity from assistance program this week  Diabetes: Controlled; current treatment: Metformin 1000 mg twice daily Trulicity 1.5 mg weekly Reports recent fasting blood sugar readings ranging 97-114 Have counseled on impact of diet and exercise on blood sugar Counsel on importance of having regular well-balanced meals and limiting carbohydrate portion sizes Encourage patient to work towards an exercise goal of 30 minutes x most days/week    Hypertension: Current treatment: lisinopril/HCTZ 20-12.5 mg - 1 tablet daily Have counseled on impact of salt/sodium on blood pressure and encouraged patient to review nutrition labels for sodium content Denies monitoring home blood pressure recently Denies symptoms of hypotension or hypertension Encourage patient to monitor BP outside office, bring log to medical appointments and if persistently >140/90 or new symptoms notify office sooner   Medication Adherence Encourage patient to restart using weekly pillbox    Hyperlipidemia: Uncontrolled based on latest LDL result; current treatment: pravastatin 40 mg daily (restarted current dose ~11/26/2020) Have discussed importance of lipid control for ASCVD risk reduction Counseled patient on lifestyle changes to help reduce LDL, including increasing exercise and having a well-balanced diet, particularly limiting saturated and trans fats. Encourage patient to work towards an exercise goal of 30 minutes x most days/week  Patient Goals/Self-Care Activities Over the next 90 days, patient will:   Self administers all medications as prescribed Using weekly pillbox as adherence tool Check blood sugars and keep log of results Contact pharmacy to order refills of medication Call provider office for new concerns or questions Attend medical appointment as scheduled  Follow Up Plan: Telephone follow up appointment with care management team member scheduled for: 09/05/2021 at 3 pm       Wallace Cullens, PharmD, [place], [facility] [place] [phone]

## 2021-06-27 NOTE — Patient Instructions (Signed)
Visit Information  Thank you for taking time to visit with me today. Please don't hesitate to contact me if I can be of assistance to you before our next scheduled telephone appointment.  Following are the goals we discussed today:   Goals Addressed             This Visit's Progress    Pharmacy Goals       Our goal A1c is less than 7%. This corresponds with fasting sugars less than 130 and 2 hour after meal sugars less than 180. Please check your blood sugar and keep a record  Please check your home blood pressure, keep a log of the results and bring this with you to your medical appointments.  Our goal bad cholesterol, or LDL, is less than 70 . This is why it is important to continue taking your pravastatin 40 mg daily  Please use your weekly pillbox to organize your medications  Feel free to call me with any questions or concerns. I look forward to our next call!   Estelle Grumbles, PharmD, BCACP Clinical Pharmacist [facility] [phone]          Our next appointment is by telephone on 09/05/2021 at 3 pm  Please call the care guide team at [phone] if you need to cancel or reschedule your appointment.    Patient verbalizes understanding of instructions and care plan provided today and agrees to view in MyChart. Active MyChart status and patient understanding of how to access instructions and care plan via MyChart confirmed with patient.

## 2021-07-17 ENCOUNTER — Ambulatory Visit: Payer: Medicare HMO

## 2021-07-19 ENCOUNTER — Ambulatory Visit (INDEPENDENT_AMBULATORY_CARE_PROVIDER_SITE_OTHER): Payer: Medicare HMO

## 2021-07-19 VITALS — BP 135/72 | HR 64 | Temp 97.6°F | Resp 17 | Ht 69.5 in | Wt 194.6 lb

## 2021-07-19 DIAGNOSIS — Z0000 Encounter for general adult medical examination without abnormal findings: Secondary | ICD-10-CM | POA: Diagnosis not present

## 2021-07-19 NOTE — Progress Notes (Signed)
Subjective:   [name]. is a 76 y.o. male who presents for Medicare Annual/Subsequent preventive examination.  Review of Systems    Per HPI unless specifically indicated above         Objective:    Today's Vitals   07/19/21 1507 07/19/21 1520  BP: (!) 142/65 135/72  Pulse: 64   Resp: 17   Temp: 97.6 F (36.4 C)   TempSrc: Temporal   SpO2: 100%   Weight: 194 lb 9.6 oz (88.3 kg)   Height: 5' 9.5" (1.765 m)   PainSc: 0-No pain    Body mass index is 28.33 kg/m.     07/11/2020    2:02 PM 10/22/2018   11:29 PM 10/22/2018   12:20 PM 08/05/2017    4:02 PM 05/22/2016    1:37 PM 12/08/2014    2:12 PM  Advanced Directives  Does Patient Have a Medical Advance Directive? _0  No  Would patient like information on creating a medical advance directive?  No - Patient declined No - Patient declined Yes (MAU/Ambulatory/Procedural Areas - Information given)  No - patient declined information    Current Medications (verified) Outpatient Encounter Medications as of 07/19/2021  Medication Sig   ACCU-CHEK AVIVA PLUS test strip Check sugar up to 2 x daily   Accu-Chek FastClix Lancets MISC Check sugar up to 2 x daily   Accu-Chek Softclix Lancets lancets Check sugar up to 2 x daily   acetaminophen (TYLENOL) 325 MG tablet Take 2 tablets (650 mg total) by mouth every 6 (six) hours as needed for mild pain or headache (fever >/= 101).   Blood Glucose Monitoring Suppl (ACCU-CHEK AVIVA PLUS) w/Device KIT Use to check blood sugar up to x 2 daily   lisinopril-hydrochlorothiazide (ZESTORETIC) 20-12.5 MG tablet Take 1 tablet by mouth daily.   metFORMIN (GLUCOPHAGE) 1000 MG tablet TAKE 1 TABLET(1000 MG) BY MOUTH TWICE DAILY WITH A MEAL   mupirocin ointment (BACTROBAN) 2 % Apply 1 application topically 2 (two) times daily. For 1 week as needed for folliculitis infection   pravastatin (PRAVACHOL) 40 MG tablet Take 1 tablet (40 mg total) by mouth daily.   sildenafil (REVATIO) 20 MG tablet  Take 2-5 pills about 30 min prior to sex.   Dulaglutide (TRULICITY) 1.5 UJ/8.1XB SOPN Inject 1.5 mg into the skin once a week for 28 days.   Selenium Sulfide 2.25 % SHAM APPLY EXTERNALLY TO THE AFFECTED AREA DAILY AS NEEDED FOR IRRITATION (Patient not taking: Reported on 07/19/2021)   No facility-administered encounter medications on file as of 07/19/2021.    Allergies (verified) 2,4-d dimethylamine; Tetanus toxoid; and Tetanus-diphtheria toxoids td   History: Past Medical History:  Diagnosis Date   Erectile dysfunction    Hypertension    Right-sided chest pain    Past Surgical History:  Procedure Laterality Date   ROTATOR CUFF REPAIR Right    Family History  Problem Relation Age of Onset   Cancer Brother        Throat   Social History   Socioeconomic History   Marital status: Divorced    Spouse name: Not on file   Number of children: 2   Years of education: Not on file   Highest education level: Not on file  Occupational History   Occupation: bus driver  Tobacco Use   Smoking status: Former    Types: Cigarettes    Quit date: 03/2021    Years since quitting: 0.3   Smokeless tobacco: Never  Tobacco comments:    pack of cigarettes last 1-2 months - doesnt inhale  Vaping Use   Vaping Use: Never used  Substance and Sexual Activity   Alcohol use: Yes    Alcohol/week: 3.0 standard drinks of alcohol    Types: 3 Cans of beer per week    Comment: 1-2 beers week    Drug use: No   Sexual activity: Not on file  Other Topics Concern   Not on file  Social History Narrative   Working 1-2 days a week .    Social Determinants of Health   Financial Resource Strain: Low Risk  (07/19/2021)   Overall Financial Resource Strain (CARDIA)    Difficulty of Paying Living Expenses: Not hard at all  Food Insecurity: No Food Insecurity (07/19/2021)   Hunger Vital Sign    Worried About Running Out of Food in the Last Year: Never true    Ran Out of Food in the Last Year: Never true   Transportation Needs: No Transportation Needs (07/19/2021)   PRAPARE - Hydrologist (Medical): No    Lack of Transportation (Non-Medical): No  Physical Activity: Inactive (07/11/2020)   Exercise Vital Sign    Days of Exercise per Week: 0 days    Minutes of Exercise per Session: 0 min  Stress: No Stress Concern Present (07/19/2021)   [place]    Feeling of Stress : Not at all  Social Connections: Moderately Isolated (07/19/2021)   Social Connection and Isolation Panel [NHANES]    Frequency of Communication with Friends and Family: More than three times a week    Frequency of Social Gatherings with Friends and Family: Twice a week    Attends Religious Services: More than 4 times per year    Active Member of [employer] or Organizations: No    Attends Archivist Meetings: Never    Marital Status: Divorced    Tobacco Counseling The pt quit smoking three months ago. He report that he was an occasional smoker.  Clinical Intake:  Pre-visit preparation completed: No  Pain : No/denies pain Pain Score: 0-No pain     Nutritional Status: BMI 25 -29 Overweight Nutritional Risks: Other (Comment) Diabetes: Yes CBG done?: Yes CBG resulted in Enter/ Edit results?: No Did pt. bring in CBG monitor from home?: No  How often do you need to have someone help you when you read instructions, pamphlets, or other written materials from your doctor or pharmacy?: 1 - Never  Diabetic?Nutrition Risk Assessment:  Has the patient had any N/V/D within the last 2 months?  No  Does the patient have any non-healing wounds?  No  Has the patient had any unintentional weight loss or weight gain?  No   Diabetes:  Is the patient diabetic?  Yes  If diabetic, was a CBG obtained today?  No  Did the patient bring in their glucometer from home?  No  How often do you monitor your CBG's? Twice weekly  .    Financial Strains and Diabetes Management:  Are you having any financial strains with the device, your supplies or your medication? No .  Does the patient want to be seen by Chronic Care Management for management of their diabetes?  No  Would the patient like to be referred to a Nutritionist or for Diabetic Management?  No   Diabetic Exams:  Diabetic Eye Exam: Completed 11/15/2020 Diabetic Foot Exam: Completed 12/04/2020  Interpreter Needed?: No  Information entered by :: Donnie Mesa, [place]   Activities of Daily Living    07/19/2021    3:04 PM  In your present state of health, do you have any difficulty performing the following activities:  Hearing? 0  Vision? 1  Difficulty concentrating or making decisions? 0  Walking or climbing stairs? 0  Dressing or bathing? 0  Doing errands, shopping? 0    Patient Care Team: [name], DO as PCP - General (Family Medicine) Curley Spice, Virl Diamond, RPH-CPP as Pharmacist [place], Dalbert Garnet, RN (Inactive) as Case Manager Greg Cutter, LCSW as Social Worker (Licensed Clinical Social Worker)  Indicate any recent [employer] you may have received from other than [facility] providers in the past year (date may be approximate). No hospitalization in the past 12 months.     Assessment:   This is a routine wellness examination for Aadarsh.  Hearing/Vision screen No hearing difficulties    Dietary issues and exercise activities discussed: Current Exercise Habits: Structured exercise class, Time (Minutes): 10, Frequency (Times/Week): 2, Weekly Exercise (Minutes/Week): 20, Intensity: Mild, Exercise limited by: None identified   Goals Addressed   None    Depression Screen    07/19/2021    3:03 PM 07/11/2020    2:03 PM 05/29/2020   10:45 AM 12/14/2019    2:02 PM 11/26/2019    1:32 PM 06/10/2019    8:01 AM 12/10/2018    9:36 AM  PHQ 2/9 Scores  PHQ - 2 Score 0 0 0 0 0 0 0  PHQ- 9 Score 0  0        Fall Risk     07/19/2021    3:03 PM 01/16/2021   11:33 AM 07/11/2020    2:03 PM 12/14/2019    2:02 PM 11/26/2019    1:32 PM  [place] in the past year? 0 0 0 0 0  Number falls in past yr: 0   0 0  Injury with Fall? 0   0 0  Risk for fall due to : No Fall Risks  Medication side effect    Follow up Falls evaluation completed  Falls evaluation completed;Education provided;Falls prevention discussed Falls evaluation completed Falls evaluation completed    FALL RISK PREVENTION PERTAINING TO THE HOME:  Any stairs in or around the home? No  If so, are there any without handrails? No  Home free of loose throw rugs in walkways, pet beds, electrical cords, etc? Yes  Adequate lighting in your home to reduce risk of falls? Yes   ASSISTIVE DEVICES UTILIZED TO PREVENT FALLS:  Life alert? No  Use of a cane, walker or w/c? No  Grab bars in the bathroom? No  Shower chair or bench in shower? No  Elevated toilet seat or a handicapped toilet? No   TIMED UP AND GO:  Was the test performed? Yes .  Length of time to ambulate 10 feet: 10  sec.   Gait steady and fast without use of assistive device  Cognitive Function:        07/19/2021    3:04 PM 07/11/2020    2:05 PM 08/05/2017    4:05 PM 05/22/2016    1:41 PM  6CIT Screen  What Year? 0 points 0 points 0 points 0 points  What month? 0 points 0 points 0 points 0 points  What time? 0 points 0 points 0 points 0 points  Count back from 20 0 points 0 points  0 points 0 points  Months in reverse 0 points 0 points 0 points 0 points  Repeat phrase 8 points 2 points 0 points 4 points  Total Score 8 points 2 points 0 points 4 points    Immunizations Immunization History  Administered Date(s) Administered   Hepb-cpg 02/02/2020, 03/06/2020   Influenza, High Dose Seasonal PF 11/07/2016, 10/28/2017, 10/02/2018, 11/05/2019, 11/21/2020   Influenza-Unspecified 12/21/2013, 09/06/2015, 10/22/2015   PFIZER(Purple Top)SARS-COV-2 Vaccination 04/23/2019,  05/14/2019, 12/27/2019   PPD Test 01/24/2020   Pneumococcal Conjugate-13 12/08/2014   Pneumococcal Polysaccharide-23 05/06/2012   Tdap 02/04/2014    TDAP status: Up to date  Flu Vaccine status: Up to date  Pneumococcal vaccine status: Up to date  Covid-19 vaccine status: Completed vaccines  Qualifies for Shingles Vaccine? Yes   Zostavax completed No   Shingrix Completed?: No.    Education has been provided regarding the importance of this vaccine. Patient has been advised to call insurance company to determine out of pocket expense if they have not yet received this vaccine. Advised may also receive vaccine at local pharmacy or Health Dept. Verbalized acceptance and understanding.  Screening Tests Health Maintenance  Topic Date Due   Zoster Vaccines- Shingrix (1 of 2) Never done   COVID-19 Vaccine (4 - Booster for Pfizer series) 02/21/2020   INFLUENZA VACCINE  08/21/2021   HEMOGLOBIN A1C  08/29/2021   OPHTHALMOLOGY EXAM  11/15/2021   FOOT EXAM  12/04/2021   Fecal DNA (Cologuard)  12/21/2023   TETANUS/TDAP  02/05/2024   Pneumonia Vaccine 18+ Years old  Completed   Hepatitis C Screening  Completed   HPV VACCINES  Aged Out    Health Maintenance  Health Maintenance Due  Topic Date Due   Zoster Vaccines- Shingrix (1 of 2) Never done   COVID-19 Vaccine (4 - Booster for Pfizer series) 02/21/2020    Colorectal cancer screening: Type of screening: Cologuard. Completed 12/20/2020. Repeat every 3 years  Lung Cancer Screening: (Low Dose CT Chest recommended if Age 40-80 years, 30 pack-year currently smoking OR have quit w/in 15years.) does not qualify.   Lung Cancer Screening Referral: does not qualify   Additional Screening:  Hepatitis C Screening: does qualify; Completed 10/31/2015   Vision Screening: Recommended annual ophthalmology exams for early detection of glaucoma and other disorders of the eye. Is the patient up to date with their annual eye exam?  Yes  Who is the  provider or what is the name of the office in which the patient attends annual eye exams? [facility]  If pt is not established with a provider, would they like to be referred to a provider to establish care? No .   Dental Screening: Recommended annual dental exams for proper oral hygiene  Community Resource Referral / Chronic Care Management: CRR required this visit?  No   CCM required this visit?  No      Plan:     I have personally reviewed and noted the following in the patient's chart:   Medical and social history Use of alcohol, tobacco or illicit drugs  Current medications and supplements including opioid prescriptions. Patient is not currently taking opioid prescriptions. Functional ability and status Nutritional status Physical activity Advanced directives List of other physicians Hospitalizations, surgeries, and ER visits in previous 12 months Vitals Screenings to include cognitive, depression, and falls Referrals and appointments  In addition, I have reviewed and discussed with patient certain preventive protocols, quality metrics, and best practice recommendations. A written personalized care plan  for preventive services as well as general preventive health recommendations were provided to patient.    Mr. Fetters , Thank you for taking time to come for your Medicare Wellness Visit. I appreciate your ongoing commitment to your health goals. Please review the following plan we discussed and let me know if I can assist you in the future.   These are the goals we discussed:  Goals       DIET - INCREASE WATER INTAKE      Recommend drinking at least 6-8 glasses of water a day       I am still so tired from covid (pt-stated)      Current Barriers:  Knowledge Deficits related to COVID-19 and impact on patient self health management  Clinical Goal(s):  Over the next 30 days, patient will verbalize basic understanding of COVID-19 impact on individual health and  self health management as evidenced by verbalization of basic understanding of COVID-19 as a viral disease, measures to prevent exposure, signs and symptoms, when to contact provider  Interventions: Advised patient to Call his HR dept about paid days off related to COvid-19 abscence Collaborated with PCP office  regarding papers that will be faxed which will need to be filled out and signed by the PCP Discussed plans with patient for ongoing care management follow up and provided patient with direct contact information for care management team Met with patient at the PCP office and assisted him with calling FMLA Source. [phone] Leave request # [phone] Spoke with Abby. Patient requested leave from 10/22/2018- 11/23/2018 Reached out to patient's benefit dept and patient was not signed up for short term Disability-[phone] Patient confirmed he would be able to pay his bills until he would be able to return to work.  Patient confirmed he did receive the Well Dine meals from [facility]  Patient reported [facility] nurse continuing to come. He said he felt like he was gaining strength.  Patient Self Care Activities:  Self administers medications as prescribed Attends all scheduled provider appointments  Please see past updates related to this goal by clicking on the "Past Updates" button in the selected goal                      I need help with my diabetic diet (pt-stated)      Current Barriers:  Knowledge Deficits related to basic Diabetes pathophysiology and self care/management  Diabetic diet  Case Manager Clinical Goal(s):  Over the next 90 days, patient will demonstrate improved adherence to prescribed treatment plan for diabetes self care/management as evidenced by:  adherence to ADA/ carb modified diet  Interventions:  Provided education to patient about basic DM disease process Reviewed medications with patient and discussed importance of medication adherence Discussed  plans with patient for ongoing care management follow up and provided patient with direct contact information for care management team Provided patient with verbal educational materials related to hypo and hyperglycemia and importance of correct treatment  Patient reports no sugars over 150 and the lowest am sugar 92 and highest am sugar 144.  Reviewed basics of ADA/Carb modified diet- discussed easy low carb options that travel easy Patient discussed the possibility of obtaining a continuous glucose monitor related to his early am schedule and difficulties of being able to check sugars during the day related to job. Made him aware I would discuss this with CCM Pharmacist. Patient also talked about his recent losses of his Brother to covid-19, his sister in  law to an MI and another young relative. Empathetic listening.  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 Let him know I would pass the message to the provider.   Patient Self Care Activities:  UNABLE to independently adhere to carb modified diabetic diet  Please see past updates related to this goal by clicking on the "Past Updates" button in the selected goal         Patient Stated      07/11/2020, wants to save $100,000 to retire      Pharmacy Goals      Our goal A1c is less than 7%. This corresponds with fasting sugars less than 130 and 2 hour after meal sugars less than 180. Please check your blood sugar and keep a record  Please check your home blood pressure, keep a log of the results and bring this with you to your medical appointments.  Our goal bad cholesterol, or LDL, is less than 70 . This is why it is important to continue taking your pravastatin 40 mg daily  Please use your weekly pillbox to organize your medications  Feel free to call me with any questions or concerns. I look forward to our next call!   Wallace Cullens, PharmD,  [place] [phone]         This is a list of the screening recommended for you and due dates:  Health Maintenance  Topic Date Due   Zoster (Shingles) Vaccine (1 of 2) Never done   COVID-19 Vaccine (4 - Booster for Pfizer series) 02/21/2020   Flu Shot  08/21/2021   Hemoglobin A1C  08/29/2021   Eye exam for diabetics  11/15/2021   Complete foot exam   12/04/2021   Cologuard (Stool DNA test)  12/21/2023   Tetanus Vaccine  02/05/2024   Pneumonia Vaccine  Completed   Hepatitis C Screening: USPSTF Recommendation to screen - Ages 18-79 yo.  Completed   HPV Vaccine  Aged [address], [place]   07/19/2021

## 2021-07-19 NOTE — Patient Instructions (Signed)
Health Maintenance, Male Adopting a healthy lifestyle and getting preventive care are important in promoting health and wellness. Ask your health care provider about: The right schedule for you to have regular tests and exams. Things you can do on your own to prevent diseases and keep yourself healthy. What should I know about diet, weight, and exercise? Eat a healthy diet  Eat a diet that includes plenty of vegetables, fruits, low-fat dairy products, and lean protein. Do not eat a lot of foods that are high in solid fats, added sugars, or sodium. Maintain a healthy weight Body mass index (BMI) is a measurement that can be used to identify possible weight problems. It estimates body fat based on height and weight. Your health care provider can help determine your BMI and help you achieve or maintain a healthy weight. Get regular exercise Get regular exercise. This is one of the most important things you can do for your health. Most adults should: Exercise for at least 150 minutes each week. The exercise should increase your heart rate and make you sweat (moderate-intensity exercise). Do strengthening exercises at least twice a week. This is in addition to the moderate-intensity exercise. Spend less time sitting. Even light physical activity can be beneficial. Watch cholesterol and blood lipids Have your blood tested for lipids and cholesterol at 75 years of age, then have this test every 5 years. You may need to have your cholesterol levels checked more often if: Your lipid or cholesterol levels are high. You are older than 75 years of age. You are at high risk for heart disease. What should I know about cancer screening? Many types of cancers can be detected early and may often be prevented. Depending on your health history and family history, you may need to have cancer screening at various ages. This may include screening for: Colorectal cancer. Prostate cancer. Skin cancer. Lung  cancer. What should I know about heart disease, diabetes, and high blood pressure? Blood pressure and heart disease High blood pressure causes heart disease and increases the risk of stroke. This is more likely to develop in people who have high blood pressure readings or are overweight. Talk with your health care provider about your target blood pressure readings. Have your blood pressure checked: Every 3-5 years if you are 18-39 years of age. Every year if you are 40 years old or older. If you are between the ages of 65 and 75 and are a current or former smoker, ask your health care provider if you should have a one-time screening for abdominal aortic aneurysm (AAA). Diabetes Have regular diabetes screenings. This checks your fasting blood sugar level. Have the screening done: Once every three years after age 45 if you are at a normal weight and have a low risk for diabetes. More often and at a younger age if you are overweight or have a high risk for diabetes. What should I know about preventing infection? Hepatitis B If you have a higher risk for hepatitis B, you should be screened for this virus. Talk with your health care provider to find out if you are at risk for hepatitis B infection. Hepatitis C Blood testing is recommended for: Everyone born from 1945 through 1965. Anyone with known risk factors for hepatitis C. Sexually transmitted infections (STIs) You should be screened each year for STIs, including gonorrhea and chlamydia, if: You are sexually active and are younger than 75 years of age. You are older than 75 years of age and your   health care provider tells you that you are at risk for this type of infection. Your sexual activity has changed since you were last screened, and you are at increased risk for chlamydia or gonorrhea. Ask your health care provider if you are at risk. Ask your health care provider about whether you are at high risk for HIV. Your health care provider  may recommend a prescription medicine to help prevent HIV infection. If you choose to take medicine to prevent HIV, you should first get tested for HIV. You should then be tested every 3 months for as long as you are taking the medicine. Follow these instructions at home: Alcohol use Do not drink alcohol if your health care provider tells you not to drink. If you drink alcohol: Limit how much you have to 0-2 drinks a day. Know how much alcohol is in your drink. In the [place] one drink equals one 12 oz bottle of beer (355 mL), one 5 oz glass of wine (148 mL), or one 1 oz glass of hard liquor (44 mL). Lifestyle Do not use any products that contain nicotine or tobacco. These products include cigarettes, chewing tobacco, and vaping devices, such as e-cigarettes. If you need help quitting, ask your health care provider. Do not use street drugs. Do not share needles. Ask your health care provider for help if you need support or information about quitting drugs. General instructions Schedule regular health, dental, and eye exams. Stay current with your vaccines. Tell your health care provider if: You often feel depressed. You have ever been abused or do not feel safe at home. Summary Adopting a healthy lifestyle and getting preventive care are important in promoting health and wellness. Follow your health care provider's instructions about healthy diet, exercising, and getting tested or screened for diseases. Follow your health care provider's instructions on monitoring your cholesterol and blood pressure. This information is not intended to replace advice given to you by your health care provider. Make sure you discuss any questions you have with your health care provider. Document Revised: 05/29/2020 Document Reviewed: 05/29/2020 Elsevier Patient Education  2023 [employer]  

## 2021-07-20 ENCOUNTER — Ambulatory Visit: Payer: Medicare HMO

## 2021-07-20 DIAGNOSIS — E785 Hyperlipidemia, unspecified: Secondary | ICD-10-CM

## 2021-07-20 DIAGNOSIS — I10 Essential (primary) hypertension: Secondary | ICD-10-CM | POA: Diagnosis not present

## 2021-07-20 DIAGNOSIS — E1159 Type 2 diabetes mellitus with other circulatory complications: Secondary | ICD-10-CM | POA: Diagnosis not present

## 2021-07-30 ENCOUNTER — Ambulatory Visit: Payer: Medicare HMO | Admitting: Family Medicine

## 2021-07-31 ENCOUNTER — Encounter: Payer: Self-pay | Admitting: Family Medicine

## 2021-07-31 ENCOUNTER — Ambulatory Visit (INDEPENDENT_AMBULATORY_CARE_PROVIDER_SITE_OTHER): Payer: Medicare HMO | Admitting: Family Medicine

## 2021-07-31 ENCOUNTER — Other Ambulatory Visit: Payer: Self-pay | Admitting: Family Medicine

## 2021-07-31 VITALS — BP 131/75 | HR 63 | Temp 97.7°F | Wt 193.0 lb

## 2021-07-31 DIAGNOSIS — E113293 Type 2 diabetes mellitus with mild nonproliferative diabetic retinopathy without macular edema, bilateral: Secondary | ICD-10-CM

## 2021-07-31 DIAGNOSIS — E1169 Type 2 diabetes mellitus with other specified complication: Secondary | ICD-10-CM | POA: Diagnosis not present

## 2021-07-31 DIAGNOSIS — R351 Nocturia: Secondary | ICD-10-CM

## 2021-07-31 DIAGNOSIS — I10 Essential (primary) hypertension: Secondary | ICD-10-CM | POA: Diagnosis not present

## 2021-07-31 DIAGNOSIS — Z0000 Encounter for general adult medical examination without abnormal findings: Secondary | ICD-10-CM

## 2021-07-31 LAB — POCT GLYCOSYLATED HEMOGLOBIN (HGB A1C): Hemoglobin A1C: 6.8 % — AB (ref 4.0–5.6)

## 2021-07-31 MED ORDER — METFORMIN HCL 1000 MG PO TABS: 1000.0000 mg | ORAL_TABLET | Freq: Two times a day (BID) | ORAL | 3 refills | Status: DC

## 2021-07-31 NOTE — Patient Instructions (Addendum)
Thank you for coming to the office today.  Keep on current medications. No change today  Add refills Metformin  For facial cream, acne - ask if pharmacy has Over The Counter OTC version Adapalene / Differin gel OTC 0.1% If not I can order a stronger version but cost may be high  Recent Labs    11/27/20 1314 03/01/21 0941 07/31/21 1501  HGBA1C 6.4* 6.8* 6.8*   BP looks good.  DUE for FASTING BLOOD WORK AFTER VISIT (no food or drink after midnight before the lab appointment, only water or coffee without cream/sugar on the morning of)  For Lab Results, once available within 2-3 days of blood draw, you can can log in to MyChart online to view your results and a brief explanation. Also, we can discuss results at next follow-up visit.   Please schedule a Follow-up Appointment to: Return in about 4 months (around 12/01/2021) for 4 months Annual Physical AM apt fasting lab AFTER.  If you have any other questions or concerns, please feel free to call the office or send a message through MyChart. You may also schedule an earlier appointment if necessary.  Additionally, you may be receiving a survey about your experience at our office within a few days to 1 week by e-mail or mail. We value your feedback.  Saralyn Pilar, DO [facility], [place]

## 2021-07-31 NOTE — Assessment & Plan Note (Signed)
Well-controlled HTN - Home BP readings reviewed    Plan:  1.  Continue current BP regimen Lisinopril-HCTZ 20-12.5mg whole tab daily 2. Encourage improved lifestyle - low sodium diet, regular exercise 3. Continue monitor BP outside office, bring readings to next visit, if persistently >140/90 or new symptoms notify office sooner 

## 2021-07-31 NOTE — Assessment & Plan Note (Signed)
Well-controlled DM with A1c at 6.8 stable from last result No hypoglycemia Complications - DM retinopathy hyperlipidemia- increases risk of future cardiovascular complications  - OFF Glipizide  Plan:  1. Continue current therapy - Trulicity 1.5mg  weekly (PAP), Metformin 1000mg  BID 2. Encourage improved lifestyle - low carb, low sugar diet, reduce portion size, continue improving regular exercise 3. Check CBG , bring log to next visit for review 4. Continue ACEi, Statin

## 2021-07-31 NOTE — Progress Notes (Signed)
Subjective:    Patient ID: [name]., male    DOB: July 30, 1945, 76 y.o.   MRN: [id number]  [name]. is a 76 y.o. male presenting on 07/31/2021 for Diabetes   HPI  CHRONIC DM, Type 2 w/ Neuropathy See prior notes for background info - Today patient reports he is doing very well overall. No new major concerns. No further significant hypoglycemia readings.  A1c readings 6.8 and 6.8 in the past >6 months. 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 - Trulicity 1.5mg  weekly injection (on PAP for financial support, app this year submitted) - Metformin 1000mg  BID - OFF GLipizide Reports good compliance. Tolerating well w/o side-effects Currently on ACEi Lifestyle: Weight stable - Diet (improve diet) - Exercise (Limited exercise due to time - gradually improving) - Dr last DM Eye exam 11/15/20 no DM Retinopathy Denies hypoglycemia, polyuria, visual changes, numbness or tingling.   CHRONIC HTN: Controlled BP Current Meds - Lisinopril-HCTZ 20-12.5mg  - whole tab pill daily Reports good compliance, took meds today. Tolerating well, w/o complaints. Admits can have episodic headache flare with BP elevated. Denies CP, dyspnea, HA, edema, dizziness / lightheadedness       07/19/2021    3:03 PM 07/11/2020    2:03 PM 05/29/2020   10:45 AM  Depression screen PHQ 2/9  Decreased Interest 0 0 0  Down, Depressed, Hopeless 0 0 0  PHQ - 2 Score 0 0 0  Altered sleeping 0  0  Tired, decreased energy 0  0  Change in appetite 0  0  Feeling bad or failure about yourself  0  0  Trouble concentrating 0  0  Moving slowly or fidgety/restless 0  0  Suicidal thoughts 0  0  PHQ-9 Score 0  0  Difficult doing work/chores Not difficult at all  Not difficult at all    Social History   Tobacco Use   Smoking status: Former    Types: Cigarettes    Quit date: 03/2021    Years since quitting: 0.3   Smokeless tobacco: Never   Tobacco comments:    pack of cigarettes last 1-2 months  - doesnt inhale  Vaping Use   Vaping Use: Never used  Substance Use Topics   Alcohol use: Yes    Alcohol/week: 3.0 standard drinks of alcohol    Types: 3 Cans of beer per week    Comment: 1-2 beers week    Drug use: No    Review of Systems Per HPI unless specifically indicated above     Objective:    BP 131/75 (BP Location: Right Arm, Patient Position: Sitting, Cuff Size: Normal)   Pulse 63   Temp 97.7 F (36.5 C) (Temporal)   Wt 193 lb (87.5 kg)   SpO2 99%   BMI 28.09 kg/m   Wt Readings from Last 3 Encounters:  07/31/21 193 lb (87.5 kg)  07/19/21 194 lb 9.6 oz (88.3 kg)  03/01/21 193 lb 6.4 oz (87.7 kg)    Physical Exam Vitals and nursing note reviewed.  Constitutional:      General: He is not in acute distress.    Appearance: Normal appearance. He is well-developed. He is not diaphoretic.     Comments: Well-appearing, comfortable, cooperative  HENT:     Head: Normocephalic and atraumatic.  Eyes:     General:        Right eye: No discharge.        Left eye: No discharge.  Conjunctiva/sclera: Conjunctivae normal.  Cardiovascular:     Rate and Rhythm: Normal rate.  Pulmonary:     Effort: Pulmonary effort is normal.  Skin:    General: Skin is warm and dry.     Findings: No erythema or rash.  Neurological:     Mental Status: He is alert and oriented to person, place, and time.  Psychiatric:        Mood and Affect: Mood normal.        Behavior: Behavior normal.        Thought Content: Thought content normal.     Comments: Well groomed, good eye contact, normal speech and thoughts      Recent Labs    11/27/20 1314 03/01/21 0941 07/31/21 1501  HGBA1C 6.4* 6.8* 6.8*    Results for orders placed or performed in visit on 07/31/21  POCT glycosylated hemoglobin (Hb A1C)  Result Value Ref Range   Hemoglobin A1C 6.8 (A) 4.0 - 5.6 %      Assessment & Plan:   Problem List Items Addressed This Visit     Type 2 diabetes mellitus with other specified  complication (HCC) - Primary    Well-controlled DM with A1c at 6.8 stable from last result No hypoglycemia Complications - DM retinopathy hyperlipidemia- increases risk of future cardiovascular complications  - OFF Glipizide  Plan:  1. Continue current therapy - Trulicity 1.5mg  weekly (PAP), Metformin 1000mg  BID 2. Encourage improved lifestyle - low carb, low sugar diet, reduce portion size, continue improving regular exercise 3. Check CBG , bring log to next visit for review 4. Continue ACEi, Statin      Relevant Medications   metFORMIN (GLUCOPHAGE) 1000 MG tablet   Other Relevant Orders   POCT glycosylated hemoglobin (Hb A1C) (Completed)   Urine Microalbumin w/creat. ratio   Essential hypertension    Well-controlled HTN - Home BP readings reviewed    Plan:  1.  Continue current BP regimen Lisinopril-HCTZ 20-12.5mg  whole tab daily 2. Encourage improved lifestyle - low sodium diet, regular exercise 3. 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 No change today  Urine Microalbumin ordered today  Add refills Metformin  For facial cream, acne - ask if pharmacy has Over The Counter OTC version Adapalene / Differin gel OTC 0.1% If not I can order a stronger version but cost may be high  Orders Placed This Encounter  Procedures   Urine Microalbumin w/creat. ratio   POCT glycosylated hemoglobin (Hb A1C)     Meds ordered this encounter  Medications   metFORMIN (GLUCOPHAGE) 1000 MG tablet    Sig: Take 1 tablet (1,000 mg total) by mouth 2 (two) times daily with a meal.    Dispense:  180 tablet     Refill:  3    Add refills     Follow up plan: Return in about 4 months (around 12/01/2021) for 4 months Annual Physical AM apt fasting lab AFTER.  Future labs ordered for 11/2021   12/2021, DO [facility] 07/31/2021, 3:05 PM

## 2021-08-01 LAB — MICROALBUMIN / CREATININE URINE RATIO
Creatinine, Urine: 223 mg/dL (ref 20–320)
Microalb Creat Ratio: 2 mcg/mg creat (ref <30)
Microalb, Ur: 0.5 mg/dL

## 2021-09-01 ENCOUNTER — Other Ambulatory Visit: Payer: Self-pay | Admitting: Family Medicine

## 2021-09-01 DIAGNOSIS — I10 Essential (primary) hypertension: Secondary | ICD-10-CM

## 2021-09-03 NOTE — Telephone Encounter (Signed)
last RF 03/01/21 #90 3 RF  Too soon  Requested Prescriptions  Refused Prescriptions Disp Refills  . lisinopril-hydrochlorothiazide (ZESTORETIC) 20-12.5 MG tablet [Pharmacy Med Name: LISINOPRIL-HCTZ 20/12.5MG TABLETS] 45 tablet     Sig: TAKE 1/2 TABLET BY MOUTH DAILY     Cardiovascular:  ACEI + Diuretic Combos Failed - 09/01/2021  3:15 AM      Failed - Na in normal range and within 180 days    Sodium  Date Value Ref Range Status  11/27/2020 139 135 - 146 mmol/L Final  05/10/2015 141 134 - 144 mmol/L Final         Failed - K in normal range and within 180 days    Potassium  Date Value Ref Range Status  11/27/2020 3.9 3.5 - 5.3 mmol/L Final         Failed - Cr in normal range and within 180 days    Creat  Date Value Ref Range Status  11/27/2020 1.06 0.70 - 1.28 mg/dL Final   Creatinine, POC  Date Value Ref Range Status  04/05/2015 0 mg/dL Final   Creatinine, Urine  Date Value Ref Range Status  07/31/2021 223 20 - 320 mg/dL Final         Failed - eGFR is 30 or above and within 180 days    GFR, Est African American  Date Value Ref Range Status  12/06/2019 76 > OR = 60 mL/min/1.43m Final   GFR, Est Non African American  Date Value Ref Range Status  12/06/2019 66 > OR = 60 mL/min/1.793mFinal   eGFR  Date Value Ref Range Status  11/27/2020 74 > OR = 60 mL/min/1.7392minal    Comment:    The eGFR is based on the CKD-EPI 2021 equation. To calculate  the new eGFR from a previous Creatinine or Cystatin C result, go to https://www.kidney.org/professionals/ kdoqi/gfr%5Fcalculator          Passed - Patient is not pregnant      Passed - Last BP in normal range    BP Readings from Last 1 Encounters:  07/31/21 131/75         Passed - Valid encounter within last 6 months    Recent Outpatient Visits          1 month ago Type 2 diabetes mellitus with other specified complication, without long-term current use of insulin ([facility]   6 months ago Type 2 diabetes mellitus with other specified complication, without long-term current use of insulin ([facility]   9 months ago Annual physical exam   [facility]   1 year ago 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

## 2021-09-05 ENCOUNTER — Telehealth: Payer: Self-pay | Admitting: Pharmacist

## 2021-09-05 ENCOUNTER — Telehealth: Payer: Medicare HMO

## 2021-09-05 NOTE — Telephone Encounter (Signed)
  Chronic Care Management   Outreach Note  09/05/2021 Name: [name]. MRN: [id number] DOB: 1945/10/21  Referred by: [name], DO Reason for referral : No chief complaint on file.   Was unable to reach patient via telephone today and have left HIPAA compliant voicemail asking patient to return my call.    Follow Up Plan: Will attempt to reach patient by telephone within the next 90 days  Estelle Grumbles, PharmD, [place]y [place], CPP Clinical Pharmacist [facility] [phone]

## 2021-10-31 ENCOUNTER — Telehealth: Payer: Self-pay | Admitting: Pharmacist

## 2021-10-31 NOTE — Telephone Encounter (Signed)
   Outreach Note  10/31/2021 Name: [name]. MRN: [id number] DOB: 10-31-1945  Referred by: [name], DO Reason for referral : No chief complaint on file.   Outreach to patient today regarding diabetes medication adherence as requested by patient's health plan.   From review of dispensing history in chart, note patient last had metformin Rx filled on 07/17/2021 for 90 day supply.  Was unable to reach patient via telephone today and have left HIPAA compliant voicemail asking patient to return my call.    Follow Up Plan: Will attempt to reach patient by telephone again within the next 14 days  Wallace Cullens, PharmD, [place] [facility] [phone]

## 2021-11-05 ENCOUNTER — Telehealth: Payer: Self-pay | Admitting: Pharmacist

## 2021-11-05 ENCOUNTER — Telehealth: Payer: Medicare HMO

## 2021-11-05 NOTE — Telephone Encounter (Signed)
   Outreach Note  11/05/2021 Name: [name]. MRN: [id number] DOB: 1945/10/02  Referred by: [name], DO Reason for referral : No chief complaint on file.   Was unable to reach patient via telephone today and have left HIPAA compliant voicemail asking patient to return my call.  Follow Up Plan: Will collaborate with Care Guide to outreach to schedule follow up with me  Wallace Cullens, PharmD, [place] [facility] [phone]

## 2021-11-14 ENCOUNTER — Telehealth: Payer: Self-pay

## 2021-11-14 NOTE — Chronic Care Management (AMB) (Signed)
  Care Coordination  Outreach Note  11/14/2021 Name: [name]. MRN: [id number] DOB: 12/04/1945   Care Coordination Outreach Attempts: An unsuccessful telephone outreach was attempted today to offer the patient information about available care coordination services as a benefit of their health plan.   Follow Up Plan:  Additional outreach attempts will be made to offer the patient care coordination information and services.   Encounter Outcome:  No Answer  Sig Noreene Larsson, [place], [place] [address] Direct Dial: [phone] [email or web address]

## 2021-11-15 LAB — HM DIABETES EYE EXAM

## 2021-11-28 ENCOUNTER — Telehealth: Payer: Medicare HMO

## 2021-11-28 NOTE — Progress Notes (Signed)
  Care Coordination Note  11/28/2021 Name: [name]. MRN: [id number] DOB: Apr 28, 1945  Christopher Bruins. is a 76 y.o. year old male who is a primary care patient of [name], DO and is actively engaged with the care management team. I reached out to Christopher Bruins. by phone today to assist with re-scheduling a follow up visit with the Pharmacist  Follow up plan: Telephone appointment with care management team member scheduled for:12/17/2021  [name], RMA Care Guide [facility]  [place], [place] [address] Direct Dial: [phone] [email or web address]

## 2021-12-10 DIAGNOSIS — E119 Type 2 diabetes mellitus without complications: Secondary | ICD-10-CM | POA: Diagnosis not present

## 2021-12-10 DIAGNOSIS — H35372 Puckering of macula, left eye: Secondary | ICD-10-CM | POA: Diagnosis not present

## 2021-12-10 DIAGNOSIS — H2513 Age-related nuclear cataract, bilateral: Secondary | ICD-10-CM | POA: Diagnosis not present

## 2021-12-10 DIAGNOSIS — Z0100 Encounter for examination of eyes and vision without abnormal findings: Secondary | ICD-10-CM | POA: Diagnosis not present

## 2021-12-17 ENCOUNTER — Telehealth: Payer: Self-pay | Admitting: Pharmacist

## 2021-12-17 ENCOUNTER — Telehealth: Payer: Medicare HMO

## 2021-12-17 NOTE — Telephone Encounter (Signed)
   Outreach Note  12/17/2021 Name: Kartik Fernando. MRN: [id number] DOB: 1945-10-08  Referred by: [name], DO Reason for referral : No chief complaint on file.   Was unable to reach patient via telephone today x 3 and have left HIPAA compliant voicemail asking patient to return my call.    Follow Up Plan: Will attempt to reach patient by telephone again within the next 30 days  Estelle Grumbles, PharmD, [facility] Clinical Pharmacist [facility] [phone]

## 2021-12-24 ENCOUNTER — Other Ambulatory Visit: Payer: Self-pay | Admitting: Family Medicine

## 2021-12-24 ENCOUNTER — Ambulatory Visit: Payer: Medicare HMO | Admitting: Pharmacist

## 2021-12-24 DIAGNOSIS — E113293 Type 2 diabetes mellitus with mild nonproliferative diabetic retinopathy without macular edema, bilateral: Secondary | ICD-10-CM

## 2021-12-24 DIAGNOSIS — E785 Hyperlipidemia, unspecified: Secondary | ICD-10-CM

## 2021-12-24 MED ORDER — PRAVASTATIN SODIUM 40 MG PO TABS: 40.0000 mg | ORAL_TABLET | Freq: Every day | ORAL | 0 refills | Status: DC

## 2021-12-24 NOTE — Chronic Care Management (AMB) (Unsigned)
12/24/2021 Name: [name]. MRN: [id number] DOB: 01-04-1946  Chief Complaint  Patient presents with   Medication Adherence   Medication Assistance    [name]. is a 76 y.o. year old male who presented for a telephone visit.   They were referred to the pharmacist by their PCP for assistance in managing diabetes, hypertension, hyperlipidemia, and medication access.    Subjective:  Care Team: Primary Care Provider: [name], DO   Medication Access/Adherence  Current Pharmacy:  [place], [address] [place] [address] Phone: [phone] Fax: [phone]  [place] [id number], [place] [address] [place] [address] Phone: [phone] Fax: [phone]  [place], [address] [place] [address] Phone: [phone] Fax: [phone]   Patient reports affordability concerns with their medications: No  Patient reports access/transportation concerns to their pharmacy: No  Patient reports adherence concerns with their medications:  Admits to not using weekly pillbox recently  Recent fill dates:  - metformin 1000 mg Rx last filled 07/17/2021 for 90 day supply - pravastatin 40 mg Rx last filled 07/04/2021 for 90 day supply - note current Rx now expired   Diabetes:  Current medications:  Metformin 1000 mg twice daily Trulicity 1.5 mg weekly  Previous medication tried: glipizide  Current glucose readings: ranging 120-140 in afternoons when returns from work   Patient denies hypoglycemic s/sx including dizziness, shakiness, sweating.    Current medication access support: APPROVED to received Trulicity through [place] patient assistance program through 01/20/2022  Hypertension:  Current medications: lisinopril/HCTZ 20-12.5 mg - 1  tablet daily   Patient has a validated, automated, upper arm home BP cuff, but denies monitoring recently    Hyperlipidemia/ASCVD Risk Reduction  Current lipid lowering medications: pravastatin 40 mg daily  Medications tried in the past: pravastatin 10 mg    Objective: Lab Results  Component Value Date   HGBA1C 6.8 (A) 07/31/2021    Lab Results  Component Value Date   CREATININE 1.06 11/27/2020   BUN 13 11/27/2020   NA 139 11/27/2020   K 3.9 11/27/2020   CL 105 11/27/2020   CO2 24 11/27/2020    Lab Results  Component Value Date   CHOL 213 (H) 11/27/2020   HDL 98 11/27/2020   LDLCALC 96 11/27/2020   TRIG 95 11/27/2020   CHOLHDL 2.2 11/27/2020   BP Readings from Last 3 Encounters:  07/31/21 131/75  07/19/21 135/72  03/01/21 138/75   Pulse Readings from Last 3 Encounters:  07/31/21 63  07/19/21 64  03/01/21 72     Medications Reviewed Today     Reviewed by Rennis Petty, RPH-CPP (Pharmacist) on 12/24/21 at 1801  Med List Status: <None>   Medication Order Taking? Sig Documenting Provider Last Dose Status Informant  ACCU-CHEK AVIVA PLUS test strip [id number]  Check sugar up to 2 x daily [name], DO  Active Pharmacy Records  Accu-Chek FastClix Lancets MISC [id number]  Check sugar up to 2 x daily [name], DO  Active Pharmacy Records  Accu-Chek Softclix Lancets lancets [id number]  Check sugar up to 2 x daily [name], DO  Active Pharmacy Records  acetaminophen (TYLENOL) 325 MG tablet [id number]  Take 2 tablets (650 mg total) by mouth  every 6 (six) hours as needed for mild pain or headache (fever >/= 101). [name], MD  Active   Blood Glucose Monitoring Suppl (ACCU-CHEK AVIVA PLUS) w/Device KIT [id number]  Use to check blood sugar up to x 2 daily [name], DO  Active Pharmacy Records  Dulaglutide (TRULICITY) 1.5 OV/7.0HE [facility] [id number] Yes Inject 1.5 mg into the skin once a week for 28 days.  [name], DO Taking Active   lisinopril-hydrochlorothiazide (ZESTORETIC) 20-12.5 MG tablet [id number] Yes Take 1 tablet by mouth daily. [name], DO Taking Active   metFORMIN (GLUCOPHAGE) 1000 MG tablet [id number] Yes Take 1 tablet (1,000 mg total) by mouth 2 (two) times daily with a meal. Parks Ranger, [name], DO Taking Active   mupirocin ointment (BACTROBAN) 2 % [id number]  Apply 1 application topically 2 (two) times daily. For 1 week as needed for folliculitis infection [name], DO  Active   pravastatin (PRAVACHOL) 40 MG tablet [id number]  Take 1 tablet (40 mg total) by mouth daily. [name], DO  Active   Selenium Sulfide 2.25 % SHAM [id number]  APPLY EXTERNALLY TO THE AFFECTED AREA DAILY AS NEEDED FOR IRRITATION  Patient not taking: Reported on 07/19/2021   [name], DO  Active   sildenafil (REVATIO) 20 MG tablet [id number]  Take 2-5 pills about 30 min prior to sex. [name], DO  Active               Assessment/Plan:   Counsel patient on importance of medication adherence and encourage him to restart using weekly pillbox Patient to schedule follow up appointment with PCP  Diabetes: - Have counseled on impact of diet and exercise on blood sugar - Counseled on importance of having regular well-balanced meals and limiting carbohydrate portion sizes - Recommend to check glucose, keep log of results and have this record to review at upcoming appointments - Meets financial criteria for Trulicity patient assistance program through [place]. Will collaborate with provider, CPhT, and patient to pursue assistance re-enrollment for 2024 calendar year   Hypertension: - Recommended to check home blood pressure and heart rate, keep log of results and have record to review during upcoming appointments   Hyperlipidemia/ASCVD Risk Reduction: - Currently uncontrolled.  - Review importance of medication  adherence. Patient agreeable to restart using weekly pillbox - CPP sends renewal of pravastatin Rx to pharmacy for patient as previous Rx expired.    Follow Up Plan: Clinical Pharmacist to follow up with patient by telephone again on 01/23/2022 at 2:30 PM   Wallace Cullens, PharmD, Para March, [facility] [place] [phone]

## 2021-12-24 NOTE — Telephone Encounter (Signed)
Requested medication (s) are due for refill today: For review  Requested medication (s) are on the active medication list: Yes    Last refill: 03/21/21 85ml  0 refills  Future visit scheduled No  Notes to clinic:  Please review. Last refill noted:   Inject 1.5 mg into the skin once a week for 28 days.  Requested Prescriptions  Pending Prescriptions Disp Refills   TRULICITY 1.5 MG/0.5ML SOPN [Pharmacy Med Name: Trulicity Subcutaneous Solution Pen-injector 1.5 MG/0.5ML]  0    Sig: INJECT 1.5 MG (0.5ML) UNDER THE SKIN ONCE A WEEK     Endocrinology:  Diabetes - GLP-1 Receptor Agonists Passed - 12/24/2021  9:03 AM      Passed - HBA1C is between 0 and 7.9 and within 180 days    Hemoglobin A1C  Date Value Ref Range Status  07/31/2021 6.8 (A) 4.0 - 5.6 % Corrected   Hgb A1c MFr Bld  Date Value Ref Range Status  11/27/2020 6.4 (H) <5.7 % of total Hgb Final    Comment:    For someone without known diabetes, a hemoglobin  A1c value between 5.7% and 6.4% is consistent with prediabetes and should be confirmed with a  follow-up test. . For someone with known diabetes, a value <7% indicates that their diabetes is well controlled. A1c targets should be individualized based on duration of diabetes, age, comorbid conditions, and other considerations. . This assay result is consistent with an increased risk of diabetes. . Currently, no consensus exists regarding use of hemoglobin A1c for diagnosis of diabetes for children. Verna Czech - Valid encounter within last 6 months    Recent Outpatient Visits           Today Hyperlipidemia associated with type 2 diabetes mellitus (HCC)   [facility], [name] A, RPH-CPP   4 months ago Type 2 diabetes mellitus with other specified complication, without long-term current use of insulin [facility])   [facility], Netta Neat, DO   9 months ago Type 2 diabetes mellitus with other specified  complication, without long-term current use of insulin (HCC)   [facility] [name], DO   1 year ago Annual physical exam   [facility] [name], DO   1 year ago 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

## 2021-12-25 NOTE — Patient Instructions (Signed)
Goals Addressed             This Visit's Progress    Pharmacy Goals       Our goal A1c is less than 7%. This corresponds with fasting sugars less than 130 and 2 hour after meal sugars less than 180. Please check your blood sugar and keep a record  Please check your home blood pressure, keep a log of the results and bring this with you to your medical appointments.  Our goal bad cholesterol, or LDL, is less than 70 . This is why it is important to continue taking your pravastatin 40 mg daily  Please use your weekly pillbox to organize your medications  Feel free to call me with any questions or concerns. I look forward to our next call!  Lenyx Boody Jimmie Dattilio, PharmD, BCACP, CPP Clinical Pharmacist [facility] [place] [phone]          

## 2021-12-26 ENCOUNTER — Telehealth: Payer: Self-pay | Admitting: Pharmacy Technician

## 2021-12-26 DIAGNOSIS — Z596 Low income: Secondary | ICD-10-CM

## 2021-12-26 NOTE — Progress Notes (Signed)
[facility] Customer service manager [facility])                                            [facility] Quality Pharmacy Team    12/26/2021  Kavan Devan 06/28/1945 [id number]                                      Medication Assistance Referral  Referral From: [facility] Embedded RPh Vallery Sa   Medication/Company: Trudee Kuster / [name] Patient application portion:  Mailed Provider application portion: Faxed  to Dr. Saralyn Pilar Provider address/fax verified via: Office website  Adyn Hoes P. Jakelin Taussig, CPhT [facility] [employer]  [phone]

## 2021-12-31 ENCOUNTER — Telehealth: Payer: Medicare HMO

## 2022-01-16 ENCOUNTER — Other Ambulatory Visit: Payer: Self-pay | Admitting: Family Medicine

## 2022-01-16 DIAGNOSIS — E1169 Type 2 diabetes mellitus with other specified complication: Secondary | ICD-10-CM

## 2022-01-18 NOTE — Telephone Encounter (Signed)
Requested medication (s) are due for refill today: no  Requested medication (s) are on the active medication list: yes  Last refill:  12/24/21 #90  Future visit scheduled: no  Notes to clinic:  overdue lab work- mail order wanting refill    Requested Prescriptions  Pending Prescriptions Disp Refills   pravastatin (PRAVACHOL) 40 MG tablet [Pharmacy Med Name: PRAVASTATIN 40MG  TABLETS] 90 tablet 0    Sig: TAKE 1 TABLET(40 MG) BY MOUTH DAILY     Cardiovascular:  Antilipid - Statins Failed - 01/16/2022 10:09 AM      Failed - Lipid Panel in normal range within the last 12 months    Cholesterol, Total  Date Value Ref Range Status  05/10/2015 204 (H) 100 - 199 mg/dL Final   Cholesterol  Date Value Ref Range Status  11/27/2020 213 (H) <200 mg/dL Final   LDL Cholesterol (Calc)  Date Value Ref Range Status  11/27/2020 96 mg/dL (calc) Final    Comment:    Reference range: <100 . Desirable range <100 mg/dL for primary prevention;   <70 mg/dL for patients with CHD or diabetic patients  with > or = 2 CHD risk factors. 13/07/2020 LDL-C is now calculated using the Martin-Hopkins  calculation, which is a validated novel method providing  better accuracy than the Friedewald equation in the  estimation of LDL-C.  [name] et al. Horald Pollen. Lenox Ahr): 2061-2068  (http://education.[facility].com/faq/FAQ164)    HDL  Date Value Ref Range Status  11/27/2020 98 > OR = 40 mg/dL Final  13/07/2020 53/61/4431 540 mg/dL Final   Triglycerides  Date Value Ref Range Status  11/27/2020 95 <150 mg/dL Final         Passed - Patient is not pregnant      Passed - Valid encounter within last 12 months    Recent Outpatient Visits           3 weeks ago Hyperlipidemia associated with type 2 diabetes mellitus (HCC)   [facility], [facility], RPH-CPP   5 months ago Type 2 diabetes mellitus with other specified complication, without long-term current use of insulin (HCC)   [facility], [facility], DO   10 months ago Type 2 diabetes mellitus with other specified complication, without long-term current use of insulin (HCC)   [facility], DO   1 year ago Annual physical exam   [facility], DO   1 year ago 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

## 2022-01-23 ENCOUNTER — Ambulatory Visit: Payer: Medicare HMO | Admitting: Pharmacist

## 2022-01-23 DIAGNOSIS — E1169 Type 2 diabetes mellitus with other specified complication: Secondary | ICD-10-CM

## 2022-01-23 DIAGNOSIS — I10 Essential (primary) hypertension: Secondary | ICD-10-CM

## 2022-01-23 NOTE — Patient Instructions (Signed)
Goals Addressed             This Visit's Progress    Pharmacy Goals       Our goal A1c is less than 7%. This corresponds with fasting sugars less than 130 and 2 hour after meal sugars less than 180. Please check your blood sugar and keep a record  Please check your home blood pressure, keep a log of the results and bring this with you to your medical appointments.  Our goal bad cholesterol, or LDL, is less than 70 . This is why it is important to continue taking your pravastatin 40 mg daily  Please use your weekly pillbox to organize your medications  Feel free to call me with any questions or concerns. I look forward to our next call!  Lynden Carrithers Greydon Betke, PharmD, BCACP, CPP Clinical Pharmacist [facility] [place] [phone]          

## 2022-01-23 NOTE — Progress Notes (Signed)
01/23/2022 Name: [name]. MRN: [id number] DOB: 1946/01/02  Chief Complaint  Patient presents with   Medication Assistance   Medication Management    [name]. is a 77 y.o. year old male who presented for a telephone visit.   They were referred to the pharmacist by their PCP for assistance in managing diabetes, hypertension, hyperlipidemia, and medication access.    Subjective:  Care Team: Primary Care Provider: [name], DO  Medication Access/Adherence  Current Pharmacy:  [facility] [place], [address] [place] [address] Phone: [phone] Fax: [phone]  [place] [id number], [place] [address] [place] [address] Phone: [phone] Fax: [phone]  [place], [address] [place] [address] Phone: [phone] Fax: [phone]   Patient reports affordability concerns with their medications: No  Patient reports access/transportation concerns to their pharmacy: No  Patient reports adherence concerns with their medications:  No    Confirms has restarted using his weekly pillbox to aid with adherence   Diabetes:   Current medications:  Metformin 1000 mg twice daily Trulicity 1.5 mg weekly   Previous medication tried: glipizide   Current glucose readings: unable to review recent readings as reports not currently home     Patient denies hypoglycemic s/sx including dizziness, shakiness, sweating.      Current medication access support: APPROVED to received Trulicity through [place] patient assistance program through 01/20/2022 - Confirms received re-enrollment application from [place]. Reports will complete application and mail back with his proof of income document to [place] today. - Reports he is unsure how much Trulicity  he has remaining, but states he will call back to office or clinical pharmacist if needs to request Rx be sent into local pharmacy while waiting on approval for assistance program re-enrollment   Hypertension:   Current medications: lisinopril/HCTZ 20-12.5 mg - 1 tablet daily    Patient has a validated, automated, upper arm home BP cuff, but denies monitoring recently       Hyperlipidemia/ASCVD Risk Reduction   Current lipid lowering medications: pravastatin 40 mg daily  Medications tried in the past: pravastatin 10 mg   Health Maintenance  Health Maintenance Due  Topic Date Due   Zoster Vaccines- Shingrix (1 of 2) Never done   INFLUENZA VACCINE  08/21/2021   COVID-19 Vaccine (4 - 2023-24 season) 09/21/2021   Diabetic kidney evaluation - eGFR measurement  11/27/2021   FOOT EXAM  12/04/2021     Objective: Lab Results  Component Value Date   HGBA1C 6.8 (A) 07/31/2021    Lab Results  Component Value Date   CREATININE 1.06 11/27/2020   BUN 13 11/27/2020   NA 139 11/27/2020   K 3.9 11/27/2020   CL 105 11/27/2020   CO2 24 11/27/2020    Lab Results  Component Value Date   CHOL 213 (H) 11/27/2020   HDL 98 11/27/2020   LDLCALC 96 11/27/2020   TRIG 95 11/27/2020   CHOLHDL 2.2 11/27/2020    Medications Reviewed Today     Reviewed by Rennis Petty, RPH-CPP (Pharmacist) on 01/23/22 at [place] List Status: <None>   Medication Order Taking? Sig Documenting Provider Last Dose Status Informant  ACCU-CHEK AVIVA PLUS test strip [id number]  Check sugar up  to 2 x daily [name], DO  Active Pharmacy Records  Accu-Chek [place] Lancets MISC [id number]  Check sugar up to 2 x daily [name], DO  Active Pharmacy Records  Accu-Chek Softclix Lancets lancets [id number]  Check sugar up to 2 x daily [name], DO  Active Pharmacy Records  acetaminophen (TYLENOL) 325 MG tablet [id number]  Take 2 tablets (650 mg total) by mouth every 6  (six) hours as needed for mild pain or headache (fever >/= 101). [name], MD  Active   Blood Glucose Monitoring Suppl (ACCU-CHEK AVIVA PLUS) w/Device KIT [id number]  Use to check blood sugar up to x 2 daily [place] [name], DO  Active Pharmacy Records  Dulaglutide (TRULICITY) 1.5 YI/0.1KP [place] [facility] [id number] Yes INJECT 1.5 MG (0.5ML) UNDER THE SKIN ONCE A WEEK [name], [name], DO Taking Active   lisinopril-hydrochlorothiazide (ZESTORETIC) 20-12.5 MG tablet [id number] Yes Take 1 tablet by mouth daily. [name], DO Taking Active   metFORMIN (GLUCOPHAGE) 1000 MG tablet [id number] Yes Take 1 tablet (1,000 mg total) by mouth 2 (two) times daily with a meal. [place], [name], DO Taking Active   mupirocin ointment (BACTROBAN) 2 % [id number]  Apply 1 application topically 2 (two) times daily. For 1 week as needed for folliculitis infection [name], DO  Active   pravastatin (PRAVACHOL) 40 MG tablet [id number] Yes TAKE 1 TABLET(40 MG) BY MOUTH DAILY [name], DO Taking Active   Selenium Sulfide 2.25 % SHAM [id number]  APPLY EXTERNALLY TO THE AFFECTED AREA DAILY AS NEEDED FOR IRRITATION  Patient not taking: Reported on 07/19/2021   [name], DO  Active   sildenafil (REVATIO) 20 MG tablet [id number]  Take 2-5 pills about 30 min prior to sex. [name], DO  Active               Assessment/Plan:   Counsel patient on importance of medication adherence and encourage him to continue using weekly pillbox Patient to schedule follow up appointment with PCP   Diabetes: - Have counseled on impact of diet and exercise on blood sugar - Counseled on importance of having regular well-balanced meals and limiting carbohydrate portion sizes - Recommend to check glucose, keep log of results and have this record to review at upcoming appointments - Collaborating with provider, CPhT, and patient to pursue assistance  re-enrollment for 2024 calendar year States he will complete application and mail back with his proof of income document to [place] today - Patient to call office or clinical pharmacist if needs to request Trulicity Rx be sent into local pharmacy while waiting on approval for assistance program re-enrollment  Hypertension: - Recommended to check home blood pressure and heart rate, keep log of results and have record to review during upcoming appointments     Hyperlipidemia/ASCVD Risk Reduction: - Currently uncontrolled.  - Review importance of medication adherence. Patient agreeable to restart using weekly pillbox    Follow Up Plan: Clinical Pharmacist to follow up with patient by telephone again on 02/20/2022 at 2:30 pm  Wallace Cullens, PharmD, Para [place], [place] [phone]

## 2022-02-01 ENCOUNTER — Telehealth: Payer: Self-pay | Admitting: Pharmacy Technician

## 2022-02-01 DIAGNOSIS — Z596 Low income: Secondary | ICD-10-CM

## 2022-02-01 NOTE — Progress Notes (Signed)
[facility])                                            [place] Team    02/01/2022  Lamont Tant. 16-Jul-1945 [id number]  Received both patient and provider portion(s) of patient assistance application(s) for Trulicity. Faxed completed application and required documents into [name].    Rio Kidane P. Arnell Mausolf, [place]  [phone]

## 2022-02-13 ENCOUNTER — Telehealth: Payer: Self-pay | Admitting: Pharmacy Technician

## 2022-02-13 DIAGNOSIS — Z596 Low income: Secondary | ICD-10-CM

## 2022-02-13 NOTE — Progress Notes (Signed)
[facility])                                            [place] Team    02/13/2022  Gracin Mcpartland. [birth year] [id number]  Care coordination call placed to [place] in regard to Trulicity application.   Spoke to Quillian Quince who informs patient is APPROVED 02/01/22-01/21/23. Medication will automatically fill and ship to patient's address on file based on last fill date in 2023. Patient may call [name] at [phone] if shipment has not arrived and patient does not have sufficient supply.   Jimi Schappert P. Nikolay Demetriou, Home

## 2022-02-20 ENCOUNTER — Telehealth: Payer: Medicare HMO

## 2022-02-20 ENCOUNTER — Ambulatory Visit: Payer: Medicare HMO | Admitting: Pharmacist

## 2022-02-20 DIAGNOSIS — E1169 Type 2 diabetes mellitus with other specified complication: Secondary | ICD-10-CM

## 2022-02-20 DIAGNOSIS — I10 Essential (primary) hypertension: Secondary | ICD-10-CM

## 2022-02-20 NOTE — Progress Notes (Signed)
02/20/2022 Name: [name]. MRN: [id number] DOB: 06/13/1945  Chief Complaint  Patient presents with   Medication Assistance   Medication Management    Christopher Hedeen. is a 77 y.o. year old male who presented for a telephone visit.   They were referred to the pharmacist by their PCP for assistance in managing diabetes, hypertension, hyperlipidemia, and medication access.    Subjective:  Care Team: Primary Care Provider: [name], DO  Medication Access/Adherence  Current Pharmacy:  [facility] [place], [address] [place] [address] Phone: [phone] Fax: [phone]  [place] [id number], [place] [address] [place] [address] Phone: [phone] Fax: [phone]  [place], [address] [place] [address] [place] [address] Phone: [phone] Fax: [phone]   Patient reports affordability concerns with their medications: No  Patient reports access/transportation concerns to their pharmacy: No  Patient reports adherence concerns with their medications:  No    Reports using his weekly pillbox to aid with adherence     Diabetes:   Current medications:  Metformin 1000 mg twice daily Trulicity 1.5 mg weekly   Previous medication tried: glipizide   Current glucose readings: ranging 90-120     Patient denies hypoglycemic s/sx including dizziness, shakiness, sweating.      Current medication access support: APPROVED to received Trulicity through [place] patient assistance program through 01/21/2023 - Patient reports having 7 weeks of medication remaining    Hypertension:   Current medications: lisinopril/HCTZ 20-12.5 mg - 1 tablet daily    Patient has a validated, automated, upper arm home BP cuff, but denies monitoring recently        Hyperlipidemia/ASCVD Risk Reduction   Current lipid lowering medications: pravastatin 40 mg daily  Medications tried in the past: pravastatin 10 mg   Objective:  Lab Results  Component Value Date   HGBA1C 6.8 (A) 07/31/2021    Lab Results  Component Value Date   CREATININE 1.06 11/27/2020   BUN 13 11/27/2020   NA 139 11/27/2020   K 3.9 11/27/2020   CL 105 11/27/2020   CO2 24 11/27/2020    Lab Results  Component Value Date   CHOL 213 (H) 11/27/2020   HDL 98 11/27/2020   LDLCALC 96 11/27/2020   TRIG 95 11/27/2020   CHOLHDL 2.2 11/27/2020   BP Readings from Last 3 Encounters:  07/31/21 131/75  07/19/21 135/72  03/01/21 138/75   Pulse Readings from Last 3 Encounters:  07/31/21 63  07/19/21 64  03/01/21 72     Medications Reviewed Today     Reviewed by [name], RPH-CPP (Pharmacist) on 02/20/22 at 1516  Med List Status: <None>   Medication Order Taking? Sig Documenting Provider Last Dose Status Informant  ACCU-CHEK AVIVA PLUS test strip [id number]  Check sugar up to 2 x daily [name], DO  Active Pharmacy Records  Accu-Chek FastClix Lancets MISC [id number]  Check sugar up to 2 x daily [name], DO  Active Pharmacy Records  Accu-Chek Softclix Lancets lancets [id number]  Check sugar up to 2 x daily [name], DO  Active Pharmacy Records  acetaminophen (TYLENOL) 325 MG tablet [id number]  Take 2 tablets (650 mg total) by mouth every 6 (six) hours as needed for mild pain or  headache (fever >/= 101). [name], MD  Active   Blood Glucose Monitoring Suppl (ACCU-CHEK AVIVA PLUS) w/Device KIT [id number]  Use to check blood sugar up to x 2 daily Christopher Ranger [name], DO  Active Pharmacy Records  Dulaglutide (TRULICITY) 1.5 YC/1.4GY Bon[place] Aid [id number] Yes INJECT 1.5 MG (0.5ML) UNDER THE SKIN ONCE A WEEK [name], [name], DO Taking Active   lisinopril-hydrochlorothiazide (ZESTORETIC) 20-12.5 MG tablet  [id number] Yes Take 1 tablet by mouth daily. [name], DO Taking Active   metFORMIN (GLUCOPHAGE) 1000 MG tablet [id number] Yes Take 1 tablet (1,000 mg total) by mouth 2 (two) times daily with a meal. Christopher Ranger, [name], DO Taking Active   mupirocin ointment (BACTROBAN) 2 % [id number]  Apply 1 application topically 2 (two) times daily. For 1 week as needed for folliculitis infection [name], DO  Active   pravastatin (PRAVACHOL) 40 MG tablet [id number] Yes TAKE 1 TABLET(40 MG) BY MOUTH DAILY [name], DO Taking Active   Selenium Sulfide 2.25 % SHAM [id number]  APPLY EXTERNALLY TO THE AFFECTED AREA DAILY AS NEEDED FOR IRRITATION  Patient not taking: Reported on 07/19/2021   [name], DO  Active   sildenafil (REVATIO) 20 MG tablet [id number]  Take 2-5 pills about 30 min prior to sex. [name], DO  Active               Assessment/Plan:   Counsel patient on importance of medication adherence and encourage him to continue using weekly pillbox Have advised patient to schedule follow up appointment with PCP  Encourage patient to consider obtaining Shingles vaccine from his local pharmacy   Diabetes: - Have counseled on impact of diet and exercise on blood sugar - Counseled on importance of having regular well-balanced meals and limiting carbohydrate portion sizes - Recommend to check glucose, keep log of results and have this record to review at upcoming appointments - Review with patient refill process for Trulicity from [place] assistance program and provide patient with program phone number - Identify patient overdue for refill of metformin. Encourage patient to contact the pharmacy for refill   Hypertension: - Recommended to check home blood pressure and heart rate, keep log of results and have record to review during upcoming appointments     Hyperlipidemia/ASCVD Risk Reduction: - Currently uncontrolled.  -  Review importance of medication adherence. Patient to continue using weekly pillbox     Follow Up Plan: Clinical Pharmacist to follow up with patient by telephone again on 05/20/2022 at 2:45 pm   Wallace Cullens, PharmD, Para [place], [place] [phone]

## 2022-02-20 NOTE — Patient Instructions (Signed)
Goals Addressed             This Visit's Progress    Pharmacy Goals       Our goal A1c is less than 7%. This corresponds with fasting sugars less than 130 and 2 hour after meal sugars less than 180. Please check your blood sugar and keep a record  Please check your home blood pressure, keep a log of the results and bring this with you to your medical appointments.  Our goal bad cholesterol, or LDL, is less than 70 . This is why it is important to continue taking your pravastatin 40 mg daily  Please use your weekly pillbox to organize your medications  Feel free to call me with any questions or concerns. I look forward to our next call!  Wallace Cullens, PharmD, Para March, CPP Clinical Pharmacist [facility] [phone]

## 2022-02-26 ENCOUNTER — Encounter: Payer: Self-pay | Admitting: Family Medicine

## 2022-02-26 ENCOUNTER — Ambulatory Visit: Payer: Medicare HMO | Admitting: Family Medicine

## 2022-02-26 VITALS — BP 115/70 | HR 74 | Ht 69.5 in | Wt 185.6 lb

## 2022-02-26 DIAGNOSIS — I10 Essential (primary) hypertension: Secondary | ICD-10-CM

## 2022-02-26 DIAGNOSIS — E785 Hyperlipidemia, unspecified: Secondary | ICD-10-CM | POA: Diagnosis not present

## 2022-02-26 DIAGNOSIS — E1169 Type 2 diabetes mellitus with other specified complication: Secondary | ICD-10-CM

## 2022-02-26 DIAGNOSIS — Z23 Encounter for immunization: Secondary | ICD-10-CM | POA: Diagnosis not present

## 2022-02-26 LAB — POCT GLYCOSYLATED HEMOGLOBIN (HGB A1C): Hemoglobin A1C: 5.2 % (ref 4.0–5.6)

## 2022-02-26 MED ORDER — LISINOPRIL-HYDROCHLOROTHIAZIDE 20-12.5 MG PO TABS: 1.0000 | ORAL_TABLET | Freq: Every day | ORAL | 3 refills | Status: DC

## 2022-02-26 NOTE — Progress Notes (Unsigned)
Subjective:    Patient ID: [name]., male    DOB: 1945/09/17, 77 y.o.   MRN: [id number]  [name]. is a 77 y.o. male presenting on 02/26/2022 for Diabetes   HPI  CHRONIC DM, Type 2 w/ Neuropathy See prior notes for background info - Today patient reports he is doing very well overall. No new major concerns. No further significant hypoglycemia readings.  A1c readings 6.8 and 6.8 in the past >6 months. 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 - Trulicity 1.5mg  weekly injection (on PAP for financial support, app this year submitted) - Metformin 1000mg  BID - OFF GLipizide Reports good compliance. Tolerating well w/o side-effects Currently on ACEi Lifestyle: Weight stable - Diet (improve diet) - Exercise (Limited exercise due to time - gradually improving) - Dr [name] last DM Eye exam 11/15/20 no DM Retinopathy Denies hypoglycemia, polyuria, visual changes, numbness or tingling.   CHRONIC HTN: Controlled BP Current Meds - Lisinopril-HCTZ 20-12.5mg  - whole tab pill daily Reports good compliance, took meds today. Tolerating well, w/o complaints. Admits can have episodic headache flare with BP elevated. Denies CP, dyspnea, HA, edema, dizziness / lightheadedness   Health Maintenance: ***     02/26/2022    8:30 AM 07/19/2021    3:03 PM 07/11/2020    2:03 PM  Depression screen PHQ 2/9  Decreased Interest 0 0 0  Down, Depressed, Hopeless 0 0 0  PHQ - 2 Score 0 0 0  Altered sleeping 0 0   Tired, decreased energy 0 0   Change in appetite 0 0   Feeling bad or failure about yourself  0 0   Trouble concentrating 0 0   Moving slowly or fidgety/restless 0 0   Suicidal thoughts 0 0   PHQ-9 Score 0 0   Difficult doing work/chores Not difficult at all Not difficult at all     Social History   Tobacco Use   Smoking status: Former    Types: Cigarettes    Quit date: 03/2021    Years since quitting: 0.9   Smokeless tobacco: Never   Tobacco comments:    pack of  cigarettes last 1-2 months - doesnt inhale  Vaping Use   Vaping Use: Never used  Substance Use Topics   Alcohol use: Yes    Alcohol/week: 3.0 standard drinks of alcohol    Types: 3 Cans of beer per week    Comment: 1-2 beers week    Drug use: No    Review of Systems Per HPI unless specifically indicated above     Objective:    BP 115/70   Pulse 74   Ht 5' 9.5" (1.765 m)   Wt 185 lb 9.6 oz (84.2 kg)   SpO2 99%   BMI 27.02 kg/m   Wt Readings from Last 3 Encounters:  02/26/22 185 lb 9.6 oz (84.2 kg)  07/31/21 193 lb (87.5 kg)  07/19/21 194 lb 9.6 oz (88.3 kg)    Physical Exam  Diabetic Foot Exam - Simple   Simple Foot Form Diabetic Foot exam was performed with the following findings: Yes 02/26/2022  8:48 AM  Visual Inspection See comments: Yes Sensation Testing See comments: Yes Pulse Check Posterior Tibialis and Dorsalis pulse intact bilaterally: Yes Comments Bilateral heels with thicker callus. No ulceration. R>L mild reduced monofilament sensation over heels. Otherwise intact monofilament sensation.     Results for orders placed or performed in visit on 02/26/22  POCT HgB A1C  Result Value  Ref Range   Hemoglobin A1C 5.2 4.0 - 5.6 %      Assessment & Plan:   Problem List Items Addressed This Visit     Essential hypertension   Relevant Medications   lisinopril-hydrochlorothiazide (ZESTORETIC) 20-12.5 MG tablet   Type 2 diabetes mellitus with other specified complication (HCC) - Primary   Relevant Medications   lisinopril-hydrochlorothiazide (ZESTORETIC) 20-12.5 MG tablet   Other Relevant Orders   POCT HgB A1C (Completed)   Other Visit Diagnoses     Needs flu shot       Relevant Orders   Flu Vaccine QUAD High Dose(Fluad)       Meds ordered this encounter  Medications   lisinopril-hydrochlorothiazide (ZESTORETIC) 20-12.5 MG tablet    Sig: Take 1 tablet by mouth daily.    Dispense:  90 tablet    Refill:  3     Follow up plan: Return in about  4 months (around 06/27/2022) for 4 month fasting lab only then 1 week later Annual Physical.  Future labs ordered for ***  Future labs re order ***4 month early June TSH PSA A1c Lipid CBC CMET   Nobie Putnam, DO [facility] 02/26/2022, 8:38 AM

## 2022-02-26 NOTE — Patient Instructions (Addendum)
Thank you for coming to the office today.  Recent Labs    03/01/21 0941 07/31/21 1501 02/26/22 0838  HGBA1C 6.8* 6.8* 5.2   Future we can reduce metformin from 1000 twice a day down to 500 twice a day if you would like, let me know BEFORE you run low.  BP is well controlled  Weight down 10 lbs in 6 months  DUE for FASTING BLOOD WORK (no food or drink after midnight before the lab appointment, only water or coffee without cream/sugar on the morning of)  SCHEDULE "Lab Only" visit in the morning at the clinic for lab draw in 4  MONTHS   - Make sure Lab Only appointment is at about 1 week before your next appointment, so that results will be available  For Lab Results, once available within 2-3 days of blood draw, you can can log in to MyChart online to view your results and a brief explanation. Also, we can discuss results at next follow-up visit.   Please schedule a Follow-up Appointment to: Return in about 4 months (around 06/27/2022) for 4 month fasting lab only then 1 week later Annual Physical.  If you have any other questions or concerns, please feel free to call the office or send a message through [place]. You may also schedule an earlier appointment if necessary.  Additionally, you may be receiving a survey about your experience at our office within a few days to 1 week by e-mail or mail. We value your feedback.  Nobie Putnam, DO [place]

## 2022-02-27 ENCOUNTER — Encounter: Payer: Self-pay | Admitting: Family Medicine

## 2022-02-27 ENCOUNTER — Other Ambulatory Visit: Payer: Self-pay | Admitting: Family Medicine

## 2022-02-27 DIAGNOSIS — I10 Essential (primary) hypertension: Secondary | ICD-10-CM

## 2022-02-27 DIAGNOSIS — R351 Nocturia: Secondary | ICD-10-CM

## 2022-02-27 DIAGNOSIS — Z0000 Encounter for general adult medical examination without abnormal findings: Secondary | ICD-10-CM

## 2022-02-27 DIAGNOSIS — E1169 Type 2 diabetes mellitus with other specified complication: Secondary | ICD-10-CM

## 2022-02-27 NOTE — Assessment & Plan Note (Signed)
DM with A1c at 5.2 exception control No hypoglycemia Complications - DM retinopathy hyperlipidemia- increases risk of future cardiovascular complications  - OFF Glipizide  Plan:  1. Continue current therapy - Trulicity 1.5mg  weekly (PAP), Metformin 1000mg  TWICE A DAY - Discussed option to taper down Metformin to 500mg  IR BID 2. Encourage improved lifestyle - low carb, low sugar diet, reduce portion size, continue improving regular exercise 3. Check CBG , bring log to next visit for review 4. Continue ACEi, Statin DIABETES Foot exam

## 2022-02-27 NOTE — Assessment & Plan Note (Signed)
Well-controlled HTN - Home BP readings reviewed    Plan:  1.  Continue current BP regimen Lisinopril-HCTZ 20-12.5mg  whole tab daily 2. Encourage improved lifestyle - low sodium diet, regular exercise 3. Continue monitor BP outside office, bring readings to next visit, if persistently >140/90 or new symptoms notify office sooner

## 2022-02-27 NOTE — Assessment & Plan Note (Signed)
Controlled cholesterol on statin and lifestyle \\The  10-year ASCVD risk score (Arnett DK, et al., 2019) is: 27.5%  Plan: 1. Continue current meds - Pravastatin 40mg  daily 2. Encourage improved lifestyle - low carb/cholesterol, reduce portion size, continue improving regular exercise

## 2022-05-20 ENCOUNTER — Telehealth: Payer: Medicare HMO

## 2022-05-20 ENCOUNTER — Telehealth: Payer: Self-pay | Admitting: Pharmacist

## 2022-05-20 NOTE — Telephone Encounter (Signed)
   Outreach Note  05/20/2022 Name: [name]. MRN: [id number] DOB: 20-Aug-1945  Referred by: [name], DO Reason for referral : No chief complaint on file.   Was unable to reach patient via telephone today and have left HIPAA compliant voicemail asking patient to return my call.   Follow Up Plan: Will attempt to reach patient by telephone again within the next 60 days  Estelle Grumbles, PharmD, [facility] Clinical Pharmacist [facility] [phone]

## 2022-06-19 ENCOUNTER — Telehealth: Payer: Self-pay

## 2022-06-19 NOTE — Telephone Encounter (Signed)
Samples of trulicity received from patient assistance program.  Left message informing patient.

## 2022-06-24 ENCOUNTER — Ambulatory Visit: Payer: Medicare HMO | Admitting: Pharmacist

## 2022-06-24 DIAGNOSIS — E1169 Type 2 diabetes mellitus with other specified complication: Secondary | ICD-10-CM

## 2022-06-24 DIAGNOSIS — I10 Essential (primary) hypertension: Secondary | ICD-10-CM

## 2022-06-24 NOTE — Progress Notes (Signed)
06/24/2022 Name: [name]. MRN: [id number] DOB: 11-26-1945  Chief Complaint  Patient presents with   Medication Management   Medication Adherence    [name]. is a 77 y.o. year old male who presented for a telephone visit.   They were referred to the pharmacist by their PCP for assistance in managing diabetes, hypertension, hyperlipidemia, and medication access.      Subjective:   Care Team: Primary Care Provider: [name], DO  Medication Access/Adherence  Current Pharmacy:  [facility] [phone] [place], [place] - [address] AT [facility] [address] [place] [address] Phone: [phone] Fax: [phone]  [facility] [id number] [place], [place] - [address] [place] [address] Phone: [phone] Fax: [phone]  [facility] - [place], [place] - [address] [place] [address] Phone: [phone] Fax: [phone]   Patient reports affordability concerns with their medications: No  Patient reports access/transportation concerns to their pharmacy: No  Patient reports adherence concerns with their medications:  No     Reports using his weekly pillbox to aid with adherence  Identify patient currently out of pravastatin 40 mg. From review of dispensing history in chart, note pravastatin Rx last filled 01/16/2022 for 90 day supply   Diabetes:   Current medications:  Metformin 1000 mg twice daily Trulicity 1.5 mg weekly on Mondays   Previous medication tried: glipizide   Current glucose readings: ranging 103-140     Patient denies hypoglycemic s/sx including dizziness, shakiness, sweating.      Current medication access support: APPROVED to received Trulicity through [name] patient assistance program through 01/21/2023 - Patient reports picked up latest supply of Trulicity from assistance program from office  last week     Hypertension:   Current medications: lisinopril/HCTZ 20-12.5 mg - 1 tablet daily    Patient has a validated, automated, upper arm home BP cuff, but denies monitoring recently       Hyperlipidemia/ASCVD Risk Reduction   Current lipid lowering medications: pravastatin 40 mg daily - currently out of this medication Medications tried in the past: pravastatin 10 mg   Objective:  Lab Results  Component Value Date   HGBA1C 5.2 02/26/2022    Lab Results  Component Value Date   CREATININE 1.06 11/27/2020   BUN 13 11/27/2020   NA 139 11/27/2020   K 3.9 11/27/2020   CL 105 11/27/2020   CO2 24 11/27/2020    Lab Results  Component Value Date   CHOL 213 (H) 11/27/2020   HDL 98 11/27/2020   LDLCALC 96 11/27/2020   TRIG 95 11/27/2020   CHOLHDL 2.2 11/27/2020   BP Readings from Last 3 Encounters:  02/26/22 115/70  07/31/21 131/75  07/19/21 135/72   Pulse Readings from Last 3 Encounters:  02/26/22 74  07/31/21 63  07/19/21 64     Medications Reviewed Today     Reviewed by [name], DO (Physician) on 02/27/22 at 0101  Med List Status: <None>   Medication Order Taking? Sig Documenting Provider Last Dose Status Informant  ACCU-CHEK AVIVA PLUS test strip [id number] Yes Check sugar up to 2 x daily [name], DO Taking Active Pharmacy Records  Accu-Chek FastClix Lancets MISC [id number] Yes Check sugar up to 2 x daily [name], DO Taking Active Pharmacy Records  Accu-Chek Softclix Lancets lancets [id number]  Yes Check sugar up to 2 x daily [name], DO Taking Active Pharmacy Records  acetaminophen (TYLENOL) 325 MG tablet [id number] Yes Take 2 tablets (650 mg total) by mouth every 6 (six) hours as needed for mild pain or headache (fever >/= 101). Lonia Blood, MD Taking Active   Blood Glucose Monitoring Suppl (ACCU-CHEK AVIVA PLUS) w/Device KIT [id number] Yes Use to check blood sugar up to x 2 daily  Althea Charon Netta Neat, DO Taking Active Pharmacy Records  Dulaglutide (TRULICITY) 1.5 MG/0.5ML Namon Cirri [id number] Yes INJECT 1.5 MG (0.5ML) UNDER THE SKIN ONCE A WEEK [name], Netta Neat, DO Taking Active   lisinopril-hydrochlorothiazide (ZESTORETIC) 20-12.5 MG tablet [id number]  Take 1 tablet by mouth daily. [name], DO  Active   metFORMIN (GLUCOPHAGE) 1000 MG tablet [id number] Yes Take 1 tablet (1,000 mg total) by mouth 2 (two) times daily with a meal. Althea Charon, Netta Neat, DO Taking Active   mupirocin ointment (BACTROBAN) 2 % [id number] Yes Apply 1 application topically 2 (two) times daily. For 1 week as needed for folliculitis infection [name], DO Taking Active   pravastatin (PRAVACHOL) 40 MG tablet [id number] Yes TAKE 1 TABLET(40 MG) BY MOUTH DAILY [name], DO Taking Active   Selenium Sulfide 2.25 % SHAM [id number] Yes APPLY EXTERNALLY TO THE AFFECTED AREA DAILY AS NEEDED FOR IRRITATION [name], DO Taking Active   sildenafil (REVATIO) 20 MG tablet [id number] Yes Take 2-5 pills about 30 min prior to sex. [name], DO Taking Active               Assessment/Plan:   Counsel patient on importance of medication adherence and encourage him to continue using weekly pillbox  Advise patient to schedule follow up appointment with PCP   Again encourage patient to consider obtaining Shingles vaccine from his local pharmacy   Diabetes: - Have counseled on impact of diet and exercise on blood sugar - Counseled on importance of having regular well-balanced meals and limiting carbohydrate portion sizes - Recommend to check glucose, keep log of results and have this record to review at upcoming appointments   Hypertension: - Recommended to check home blood pressure and heart rate, keep log of results and have record to review during upcoming appointments     Hyperlipidemia/ASCVD Risk Reduction: - Currently  uncontrolled.  - Identify patient currently out of pravastatin 40 mg Rx. Encourage patient to pick up refill from pharmacy today  Outreach to [facility] today to have pravastatin prescription filled for patient today - Review importance of medication adherence. Patient to continue using weekly pillbox     Follow Up Plan: Clinical Pharmacist to follow up with patient by telephone again in October 2024 for patient assistance re-enrollment   Estelle Grumbles, PharmD, [place], CPP Clinical Pharmacist [facility] [phone]

## 2022-06-24 NOTE — Patient Instructions (Signed)
Goals Addressed             This Visit's Progress    Pharmacy Goals       Our goal A1c is less than 7%. This corresponds with fasting sugars less than 130 and 2 hour after meal sugars less than 180. Please check your blood sugar and keep a record  Please check your home blood pressure, keep a log of the results and bring this with you to your medical appointments.  Our goal bad cholesterol, or LDL, is less than 70 . This is why it is important to continue taking your pravastatin 40 mg daily  Please use your weekly pillbox to organize your medications  Feel free to call me with any questions or concerns. I look forward to our next call!  Dayne Dekay Kamarie Veno, PharmD, BCACP, CPP Clinical Pharmacist [facility] [place] [phone]          

## 2022-06-27 ENCOUNTER — Other Ambulatory Visit: Payer: Self-pay | Admitting: Family Medicine

## 2022-06-27 DIAGNOSIS — E1169 Type 2 diabetes mellitus with other specified complication: Secondary | ICD-10-CM

## 2022-06-27 NOTE — Telephone Encounter (Signed)
Rx 01/18/22 #90 3RF- too soon- 1 year supply Requested Prescriptions  Pending Prescriptions Disp Refills   pravastatin (PRAVACHOL) 40 MG tablet [Pharmacy Med Name: PRAVASTATIN 40MG  TABLETS] 90 tablet 3    Sig: TAKE 1 TABLET(40 MG) BY MOUTH DAILY     Cardiovascular:  Antilipid - Statins Failed - 06/27/2022 10:58 AM      Failed - Lipid Panel in normal range within the last 12 months    Cholesterol, Total  Date Value Ref Range Status  05/10/2015 204 (H) 100 - 199 mg/dL Final   Cholesterol  Date Value Ref Range Status  11/27/2020 213 (H) <200 mg/dL Final   LDL Cholesterol (Calc)  Date Value Ref Range Status  11/27/2020 96 mg/dL (calc) Final    Comment:    Reference range: <100 . Desirable range <100 mg/dL for primary prevention;   <70 mg/dL for patients with CHD or diabetic patients  with > or = 2 CHD risk factors. [name] LDL-C is now calculated using the Martin-Hopkins  calculation, which is a validated novel method providing  better accuracy than the Friedewald equation in the  estimation of LDL-C.  Horald Pollen et al. Lenox Ahr. 1610;960(45): 2061-2068  (http://education.[employer].com/faq/FAQ164)    HDL  Date Value Ref Range Status  11/27/2020 98 > OR = 40 mg/dL Final  40/98/1191 478 >29 mg/dL Final   Triglycerides  Date Value Ref Range Status  11/27/2020 95 <150 mg/dL Final         Passed - Patient is not pregnant      Passed - Valid encounter within last 12 months    Recent Outpatient Visits           3 days ago Type 2 diabetes mellitus with other specified complication, without long-term current use of insulin (HCC)   [place] [facility] Delles, [name] A, RPH-CPP   4 months ago Type 2 diabetes mellitus with other specified complication, without long-term current use of insulin [facility])   [place] [facility] [place], Netta Neat, DO   4 months ago Type 2 diabetes mellitus with other specified complication, without long-term  current use of insulin (HCC)   [place] [facility] Delles, [name] A, RPH-CPP   5 months ago Type 2 diabetes mellitus with other specified complication, without long-term current use of insulin [facility])   [place] [facility] Delles, [name] A, RPH-CPP   6 months ago Hyperlipidemia associated with type 2 diabetes mellitus [facility])   [place] [facility] Delles, [name] A, RPH-CPP

## 2022-08-29 ENCOUNTER — Other Ambulatory Visit: Payer: Self-pay | Admitting: Family Medicine

## 2022-08-29 DIAGNOSIS — E113293 Type 2 diabetes mellitus with mild nonproliferative diabetic retinopathy without macular edema, bilateral: Secondary | ICD-10-CM

## 2022-08-30 NOTE — Telephone Encounter (Signed)
Requested medications are due for refill today.  yes  Requested medications are on the active medications list.  yes  Last refill. 07/31/2021 #180 3 rf  Future visit scheduled.   yes  Notes to clinic.  Labs are expired.    Requested Prescriptions  Pending Prescriptions Disp Refills   metFORMIN (GLUCOPHAGE) 1000 MG tablet [Pharmacy Med Name: METFORMIN 1000MG  TABLETS] 180 tablet 3    Sig: TAKE 1 TABLET(1000 MG) BY MOUTH TWICE DAILY WITH A MEAL     Endocrinology:  Diabetes - Biguanides Failed - 08/29/2022  9:29 AM      Failed - Cr in normal range and within 360 days    Creat  Date Value Ref Range Status  11/27/2020 1.06 0.70 - 1.28 mg/dL Final   Creatinine, POC  Date Value Ref Range Status  04/05/2015 0 mg/dL Final   Creatinine, Urine  Date Value Ref Range Status  07/31/2021 223 20 - 320 mg/dL Final         Failed - HBA1C is between 0 and 7.9 and within 180 days    Hemoglobin A1C  Date Value Ref Range Status  02/26/2022 5.2 4.0 - 5.6 % Final   Hgb A1c MFr Bld  Date Value Ref Range Status  11/27/2020 6.4 (H) <5.7 % of total Hgb Final    Comment:    For someone without known diabetes, a hemoglobin  A1c value between 5.7% and 6.4% is consistent with prediabetes and should be confirmed with a  follow-up test. . For someone with known diabetes, a value <7% indicates that their diabetes is well controlled. A1c targets should be individualized based on duration of diabetes, age, comorbid conditions, and other considerations. . This assay result is consistent with an increased risk of diabetes. . Currently, no consensus exists regarding use of hemoglobin A1c for diagnosis of diabetes for children. .          Failed - eGFR in normal range and within 360 days    GFR, Est African American  Date Value Ref Range Status  12/06/2019 76 > OR = 60 mL/min/1.22m2 Final   GFR, Est Non African American  Date Value Ref Range Status  12/06/2019 66 > OR = 60 mL/min/1.67m2 Final    eGFR  Date Value Ref Range Status  11/27/2020 74 > OR = 60 mL/min/1.1m2 Final    Comment:    The eGFR is based on the CKD-EPI 2021 equation. To calculate  the new eGFR from a previous Creatinine or Cystatin C result, go to https://www.kidney.org/professionals/ kdoqi/gfr%5Fcalculator          Failed - B12 Level in normal range and within 720 days    No results found for: "VITAMINB12"       Failed - Valid encounter within last 6 months    Recent Outpatient Visits           2 months ago Type 2 diabetes mellitus with other specified complication, without long-term current use of insulin [facility])   [place] [facility] Delles, [name] A, RPH-CPP   6 months ago Type 2 diabetes mellitus with other specified complication, without long-term current use of insulin [facility])   [place] [facility] [place], Netta Neat, DO   6 months ago Type 2 diabetes mellitus with other specified complication, without long-term current use of insulin [facility])   [place] [facility] Delles, [name] A, RPH-CPP   7 months ago Type 2 diabetes mellitus with other specified  complication, without long-term current use of insulin (HCC)   [place] [facility] Delles, [name] A, RPH-CPP   8 months ago Hyperlipidemia associated with type 2 diabetes mellitus [facility])   [place] [facility] Delles, [name] A, RPH-CPP              Failed - CBC within normal limits and completed in the last 12 months    WBC  Date Value Ref Range Status  11/27/2020 3.7 (L) 3.8 - 10.8 Thousand/uL Final   RBC  Date Value Ref Range Status  11/27/2020 4.32 4.20 - 5.80 Million/uL Final   Hemoglobin  Date Value Ref Range Status  11/27/2020 13.4 13.2 - 17.1 g/dL Final   HCT  Date Value Ref Range Status  11/27/2020 40.3 38.5 - 50.0 % Final   MCHC  Date Value Ref Range Status  11/27/2020 33.3 32.0 - 36.0 g/dL Final   [facility]  Date  Value Ref Range Status  11/27/2020 31.0 27.0 - 33.0 pg Final   MCV  Date Value Ref Range Status  11/27/2020 93.3 80.0 - 100.0 fL Final   No results found for: "PLTCOUNTKUC", "LABPLAT", "POCPLA" RDW  Date Value Ref Range Status  11/27/2020 12.6 11.0 - 15.0 % Final

## 2022-09-18 ENCOUNTER — Telehealth: Payer: Self-pay

## 2022-09-18 NOTE — Telephone Encounter (Signed)
Attempted to call patient to make him aware his trulicity samples were available.

## 2022-10-02 ENCOUNTER — Encounter: Payer: Self-pay | Admitting: Family Medicine

## 2022-10-02 ENCOUNTER — Ambulatory Visit (INDEPENDENT_AMBULATORY_CARE_PROVIDER_SITE_OTHER): Payer: Medicare HMO | Admitting: Family Medicine

## 2022-10-02 VITALS — BP 138/70 | HR 73 | Ht 68.0 in | Wt 191.0 lb

## 2022-10-02 DIAGNOSIS — I10 Essential (primary) hypertension: Secondary | ICD-10-CM | POA: Diagnosis not present

## 2022-10-02 DIAGNOSIS — R351 Nocturia: Secondary | ICD-10-CM | POA: Diagnosis not present

## 2022-10-02 DIAGNOSIS — Z0000 Encounter for general adult medical examination without abnormal findings: Secondary | ICD-10-CM

## 2022-10-02 DIAGNOSIS — Z23 Encounter for immunization: Secondary | ICD-10-CM | POA: Diagnosis not present

## 2022-10-02 DIAGNOSIS — E1169 Type 2 diabetes mellitus with other specified complication: Secondary | ICD-10-CM | POA: Diagnosis not present

## 2022-10-02 DIAGNOSIS — E785 Hyperlipidemia, unspecified: Secondary | ICD-10-CM | POA: Diagnosis not present

## 2022-10-02 LAB — POCT GLYCOSYLATED HEMOGLOBIN (HGB A1C): Hemoglobin A1C: 6.4 % — AB (ref 4.0–5.6)

## 2022-10-02 NOTE — Patient Instructions (Addendum)
Thank you for coming to the office today.  Recent Labs    02/26/22 0838 10/02/22 1331  HGBA1C 5.2 6.4*   Keep up the great work overall.  Blood sugar is excellent. Continue current medications.  If you run low medicines, we can refill.  Flu Shot today  Labs today  Please schedule a Follow-up Appointment to: Return in about 5 months (around 03/04/2023) for 5 month DM A1c.  If you have any other questions or concerns, please feel free to call the office or send a message through MyChart. You may also schedule an earlier appointment if necessary.  Additionally, you may be receiving a survey about your experience at our office within a few days to 1 week by e-mail or mail. We value your feedback.  Saralyn Pilar, DO [facility], [place]

## 2022-10-02 NOTE — Progress Notes (Unsigned)
Subjective:    Patient ID: Christopher Bruins., male    DOB: 03/14/1945, 77 y.o.   MRN: [id number]  Christopher Randles. is a 77 y.o. male presenting on 10/02/2022 for Annual Exam   HPI Discussed the use of AI scribe software for clinical note transcription with the patient, who gave verbal consent to proceed.  Here for Annual Physical and Lab Review  CHRONIC DM, Type 2 w/ Neuropathy See prior notes for background info - Today patient reports he is doing very well overall. No new major concerns. No further significant hypoglycemia readings.  A1c down to 6.4 DOT in Feb - Trulicity 1.5mg  weekly injection (on PAP for financial support, app this year submitted) - Metformin 1000mg  BID - OFF GLipizide Reports good compliance. Tolerating well w/o side-effects Currently on ACEi Lifestyle: Weight stable - Diet (improve diet) - Exercise (Limited exercise due to time - gradually improving) - Dr Clydene Pugh last DM Eye exam 11/15/21 no DM Retinopathy Denies hypoglycemia, polyuria, visual changes, numbness or tingling.   CHRONIC HTN: Controlled BP Current Meds - Lisinopril-HCTZ 20-12.5mg  - whole tab pill daily Reports good compliance, took meds today. Tolerating well, w/o complaints. Admits can have episodic headache flare with BP elevated. Denies CP, dyspnea, HA, edema, dizziness / lightheadedness    HYPERLIPIDEMIA: - Reports no concerns. Last lipid panel 2024, controlled  - Currently taking Pravastatin 40mg , tolerating well without side effects or myalgias   The patient's weight has fluctuated slightly over the past year, with a slight increase over the past six months. He has not been actively working on any specific diet or exercise strategies. The patient's current weight places him just under the obesity range with a BMI of 29.  The patient also reports a slight soreness in his wrist, which he attributes to driving. He has considered using a brace or splint for support.  The patient has  been compliant with his medication regimen and reports no side effects. He has not noticed any trends of increasing blood pressure, attributing a slightly elevated reading at the current visit to rushing from work.      Health Maintenance: Flu Shot today      02/26/2022    8:30 AM 07/19/2021    3:03 PM 07/11/2020    2:03 PM  Depression screen PHQ 2/9  Decreased Interest 0 0 0  Down, Depressed, Hopeless 0 0 0  PHQ - 2 Score 0 0 0  Altered sleeping 0 0   Tired, decreased energy 0 0   Change in appetite 0 0   Feeling bad or failure about yourself  0 0   Trouble concentrating 0 0   Moving slowly or fidgety/restless 0 0   Suicidal thoughts 0 0   PHQ-9 Score 0 0   Difficult doing work/chores Not difficult at all Not difficult at all     Past Medical History:  Diagnosis Date   Erectile dysfunction    Hypertension    Right-sided chest pain    Past Surgical History:  Procedure Laterality Date   ROTATOR CUFF REPAIR Right    Social History   Socioeconomic History   Marital status: Divorced    Spouse name: Not on file   Number of children: 2   Years of education: Not on file   Highest education level: Not on file  Occupational History   Occupation: bus driver  Tobacco Use   Smoking status: Former    Current packs/day: 0.00    Types: Cigarettes  Quit date: 03/2021    Years since quitting: 1.5   Smokeless tobacco: Never   Tobacco comments:    pack of cigarettes last 1-2 months - doesnt inhale  Vaping Use   Vaping status: Never Used  Substance and Sexual Activity   Alcohol use: Yes    Alcohol/week: 3.0 standard drinks of alcohol    Types: 3 Cans of beer per week    Comment: 1-2 beers week    Drug use: No   Sexual activity: Not on file  Other Topics Concern   Not on file  Social History Narrative   Working 1-2 days a week .    Social Determinants of Health   Financial Resource Strain: Low Risk  (07/19/2021)   Overall Financial Resource Strain (CARDIA)     Difficulty of Paying Living Expenses: Not hard at all  Food Insecurity: No Food Insecurity (07/19/2021)   Hunger Vital Sign    Worried About Running Out of Food in the Last Year: Never true    Ran Out of Food in the Last Year: Never true  Transportation Needs: No Transportation Needs (07/19/2021)   PRAPARE - Administrator, Civil Service (Medical): No    Lack of Transportation (Non-Medical): No  Physical Activity: Inactive (07/11/2020)   Exercise Vital Sign    Days of Exercise per Week: 0 days    Minutes of Exercise per Session: 0 min  Stress: No Stress Concern Present (07/19/2021)   [employer] of Occupational Health - Occupational Stress Questionnaire    Feeling of Stress : Not at all  Social Connections: Moderately Isolated (07/19/2021)   Social Connection and Isolation Panel [NHANES]    Frequency of Communication with Friends and Family: More than three times a week    Frequency of Social Gatherings with Friends and Family: Twice a week    Attends Religious Services: More than 4 times per year    Active Member of [employer] or Organizations: No    Attends Banker Meetings: Never    Marital Status: Divorced  Catering manager Violence: Not At Risk (08/05/2017)   Humiliation, Afraid, Rape, and Kick questionnaire    Fear of Current or Ex-Partner: No    Emotionally Abused: No    Physically Abused: No    Sexually Abused: No   Family History  Problem Relation Age of Onset   Cancer Brother        Throat   Current Outpatient Medications on File Prior to Visit  Medication Sig   ACCU-CHEK AVIVA PLUS test strip Check sugar up to 2 x daily   Accu-Chek FastClix Lancets MISC Check sugar up to 2 x daily   Accu-Chek Softclix Lancets lancets Check sugar up to 2 x daily   acetaminophen (TYLENOL) 325 MG tablet Take 2 tablets (650 mg total) by mouth every 6 (six) hours as needed for mild pain or headache (fever >/= 101).   Blood Glucose Monitoring Suppl (ACCU-CHEK AVIVA  PLUS) w/Device KIT Use to check blood sugar up to x 2 daily   Dulaglutide (TRULICITY) 1.5 MG/0.5ML SOPN INJECT 1.5 MG (0.5ML) UNDER THE SKIN ONCE A WEEK   lisinopril-hydrochlorothiazide (ZESTORETIC) 20-12.5 MG tablet Take 1 tablet by mouth daily.   metFORMIN (GLUCOPHAGE) 1000 MG tablet TAKE 1 TABLET(1000 MG) BY MOUTH TWICE DAILY WITH A MEAL   pravastatin (PRAVACHOL) 40 MG tablet TAKE 1 TABLET(40 MG) BY MOUTH DAILY   sildenafil (REVATIO) 20 MG tablet Take 2-5 pills about 30 min prior to sex.  mupirocin ointment (BACTROBAN) 2 % Apply 1 application topically 2 (two) times daily. For 1 week as needed for folliculitis infection   Selenium Sulfide 2.25 % SHAM APPLY EXTERNALLY TO THE AFFECTED AREA DAILY AS NEEDED FOR IRRITATION (Patient not taking: Reported on 10/02/2022)   No current facility-administered medications on file prior to visit.    Review of Systems  Constitutional:  Negative for activity change, appetite change, chills, diaphoresis, fatigue and fever.  HENT:  Negative for congestion and hearing loss.   Eyes:  Negative for visual disturbance.  Respiratory:  Negative for cough, chest tightness, shortness of breath and wheezing.   Cardiovascular:  Negative for chest pain, palpitations and leg swelling.  Gastrointestinal:  Negative for abdominal pain, constipation, diarrhea, nausea and vomiting.  Genitourinary:  Negative for dysuria, frequency and hematuria.  Musculoskeletal:  Negative for arthralgias and neck pain.  Skin:  Negative for rash.  Neurological:  Negative for dizziness, weakness, light-headedness, numbness and headaches.  Hematological:  Negative for adenopathy.  Psychiatric/Behavioral:  Negative for behavioral problems, dysphoric mood and sleep disturbance.    Per HPI unless specifically indicated above     Objective:    BP 138/70 (BP Location: Left Arm, Cuff Size: Normal)   Pulse 73   Ht 5\' 8"  (1.727 m)   Wt 191 lb (86.6 kg)   SpO2 97%   BMI 29.04 kg/m   Wt  Readings from Last 3 Encounters:  10/02/22 191 lb (86.6 kg)  02/26/22 185 lb 9.6 oz (84.2 kg)  07/31/21 193 lb (87.5 kg)    Physical Exam Vitals and nursing note reviewed.  Constitutional:      General: He is not in acute distress.    Appearance: He is well-developed. He is not diaphoretic.     Comments: Well-appearing, comfortable, cooperative  HENT:     Head: Normocephalic and atraumatic.  Eyes:     General:        Right eye: No discharge.        Left eye: No discharge.     Conjunctiva/sclera: Conjunctivae normal.     Pupils: Pupils are equal, round, and reactive to light.  Neck:     Thyroid: No thyromegaly.  Cardiovascular:     Rate and Rhythm: Normal rate and regular rhythm.     Pulses: Normal pulses.     Heart sounds: Normal heart sounds. No murmur heard. Pulmonary:     Effort: Pulmonary effort is normal. No respiratory distress.     Breath sounds: Normal breath sounds. No wheezing or rales.  Abdominal:     General: Bowel sounds are normal. There is no distension.     Palpations: Abdomen is soft. There is no mass.     Tenderness: There is no abdominal tenderness.  Musculoskeletal:        General: No tenderness. Normal range of motion.     Cervical back: Normal range of motion and neck supple.     Comments: Upper / Lower Extremities: - Normal muscle tone, strength bilateral upper extremities 5/5, lower extremities 5/5  Lymphadenopathy:     Cervical: No cervical adenopathy.  Skin:    General: Skin is warm and dry.     Findings: No erythema or rash.  Neurological:     Mental Status: He is alert and oriented to person, place, and time.     Comments: Distal sensation intact to light touch all extremities  Psychiatric:        Mood and Affect: Mood normal.  Behavior: Behavior normal.        Thought Content: Thought content normal.     Comments: Well groomed, good eye contact, normal speech and thoughts      Results for orders placed or performed in visit on  10/02/22  CBC with Differential/Platelet  Result Value Ref Range   WBC 3.5 (L) 3.8 - 10.8 Thousand/uL   RBC 4.31 4.20 - 5.80 Million/uL   Hemoglobin 13.5 13.2 - 17.1 g/dL   HCT 16.1 09.6 - 04.5 %   MCV 93.7 80.0 - 100.0 fL   MCH 31.3 27.0 - 33.0 pg   MCHC 33.4 32.0 - 36.0 g/dL   RDW 40.9 81.1 - 91.4 %   Platelets 179 140 - 400 Thousand/uL   MPV 9.4 7.5 - 12.5 fL   Neutro Abs 1,488 (L) 1,500 - 7,800 cells/uL   Lymphs Abs 1,589 850 - 3,900 cells/uL   Absolute Monocytes 354 200 - 950 cells/uL   Eosinophils Absolute 49 15 - 500 cells/uL   Basophils Absolute 21 0 - 200 cells/uL   Neutrophils Relative % 42.5 %   Total Lymphocyte 45.4 %   Monocytes Relative 10.1 %   Eosinophils Relative 1.4 %   Basophils Relative 0.6 %  POCT glycosylated hemoglobin (Hb A1C)  Result Value Ref Range   Hemoglobin A1C 6.4 (A) 4.0 - 5.6 %   HbA1c POC (<> result, manual entry)     HbA1c, POC (prediabetic range)     HbA1c, POC (controlled diabetic range)        Assessment & Plan:   Problem List Items Addressed This Visit     Essential hypertension    Well-controlled HTN - Home BP readings reviewed    Plan:  1.  Continue current BP regimen Lisinopril-HCTZ 20-12.5mg  whole tab daily 2. Encourage improved lifestyle - low sodium diet, regular exercise 3. Continue monitor BP outside office, bring readings to next visit, if persistently >140/90 or new symptoms notify office sooner      Hyperlipidemia associated with type 2 diabetes mellitus (HCC)    Controlled cholesterol on statin and lifestyle \\The  10-year ASCVD risk score (Arnett DK, et al., 2019) is: 37.6%  Plan: 1. Continue current meds - Pravastatin 40mg  daily 2. Encourage improved lifestyle - low carb/cholesterol, reduce portion size, continue improving regular exercise      Type 2 diabetes mellitus with other specified complication (HCC)    A1c 6.4, stable No hypoglycemia Complications - DM retinopathy hyperlipidemia- increases risk of  future cardiovascular complications  - OFF Glipizide  Plan:  1. Continue current therapy - Trulicity 1.5mg  weekly (PAP), Metformin 1000mg  TWICE A DAY - Discussed option to taper down Metformin to 500mg  IR BID 2. Encourage improved lifestyle - low carb, low sugar diet, reduce portion size, continue improving regular exercise 3. Check CBG , bring log to next visit for review 4. Continue ACEi, Statin DIABETES Foot exam Urine micro      Relevant Orders   POCT glycosylated hemoglobin (Hb A1C) (Completed)   Urine Microalbumin w/creat. ratio   Other Visit Diagnoses     Annual physical exam    -  Primary   Need for influenza vaccination       Relevant Orders   Flu Vaccine Trivalent High Dose (Fluad) (Completed)   Nocturia           Updated Health Maintenance information Fasting lab orders today pending result Encouraged improvement to lifestyle with diet and exercise Goal of weight loss  General Health Maintenance -Administer influenza vaccine today. -Order comprehensive metabolic panel, urine analysis today. -Recommend annual eye exam with [facility]. -Consider repeat colon test in 2025. -Schedule follow-up appointment for end of January/early February 2025. -Results will be communicated via MyChart.      No orders of the defined types were placed in this encounter.     Follow up plan: Return in about 5 months (around 03/04/2023) for 5 month DM A1c.  Christopher Pilar, DO [facility] 10/02/2022, 1:28 PM

## 2022-10-03 LAB — COMPLETE METABOLIC PANEL WITH GFR
AG Ratio: 1.5 (calc) (ref 1.0–2.5)
ALT: 13 U/L (ref 9–46)
AST: 17 U/L (ref 10–35)
Albumin: 4.3 g/dL (ref 3.6–5.1)
Alkaline phosphatase (APISO): 52 U/L (ref 35–144)
BUN: 12 mg/dL (ref 7–25)
CO2: 27 mmol/L (ref 20–32)
Calcium: 9.6 mg/dL (ref 8.6–10.3)
Chloride: 100 mmol/L (ref 98–110)
Creat: 1.06 mg/dL (ref 0.70–1.28)
Globulin: 2.9 g/dL (ref 1.9–3.7)
Glucose, Bld: 95 mg/dL (ref 65–99)
Potassium: 4.3 mmol/L (ref 3.5–5.3)
Sodium: 136 mmol/L (ref 135–146)
Total Bilirubin: 0.8 mg/dL (ref 0.2–1.2)
Total Protein: 7.2 g/dL (ref 6.1–8.1)
eGFR: 73 mL/min/{1.73_m2} (ref >=60)

## 2022-10-03 LAB — CBC WITH DIFFERENTIAL/PLATELET
Absolute Monocytes: 354 {cells}/uL (ref 200–950)
Basophils Absolute: 21 {cells}/uL (ref 0–200)
Basophils Relative: 0.6 %
Eosinophils Absolute: 49 {cells}/uL (ref 15–500)
Eosinophils Relative: 1.4 %
HCT: 40.4 % (ref 38.5–50.0)
Hemoglobin: 13.5 g/dL (ref 13.2–17.1)
Lymphs Abs: 1589 {cells}/uL (ref 850–3900)
MCH: 31.3 pg (ref 27.0–33.0)
MCHC: 33.4 g/dL (ref 32.0–36.0)
MCV: 93.7 fL (ref 80.0–100.0)
MPV: 9.4 fL (ref 7.5–12.5)
Monocytes Relative: 10.1 %
Neutro Abs: 1488 {cells}/uL — ABNORMAL LOW (ref 1500–7800)
Neutrophils Relative %: 42.5 %
Platelets: 179 10*3/uL (ref 140–400)
RBC: 4.31 10*6/uL (ref 4.20–5.80)
RDW: 12.6 % (ref 11.0–15.0)
Total Lymphocyte: 45.4 %
WBC: 3.5 10*3/uL — ABNORMAL LOW (ref 3.8–10.8)

## 2022-10-03 LAB — MICROALBUMIN / CREATININE URINE RATIO
Creatinine, Urine: 90 mg/dL (ref 20–320)
Microalb Creat Ratio: 2 mg/g{creat} (ref <30)
Microalb, Ur: 0.2 mg/dL

## 2022-10-03 LAB — LIPID PANEL
Cholesterol: 195 mg/dL (ref <200)
HDL: 104 mg/dL (ref >=40)
LDL Cholesterol (Calc): 75 mg/dL
Non-HDL Cholesterol (Calc): 91 mg/dL (ref <130)
Total CHOL/HDL Ratio: 1.9 (calc) (ref <5.0)
Triglycerides: 80 mg/dL (ref <150)

## 2022-10-03 LAB — TSH: TSH: 1.68 m[IU]/L (ref 0.40–4.50)

## 2022-10-03 LAB — PSA: PSA: 0.23 ng/mL (ref <=4.00)

## 2022-10-03 NOTE — Assessment & Plan Note (Signed)
Well-controlled HTN - Home BP readings reviewed    Plan:  1.  Continue current BP regimen Lisinopril-HCTZ 20-12.5mg whole tab daily 2. Encourage improved lifestyle - low sodium diet, regular exercise 3. Continue monitor BP outside office, bring readings to next visit, if persistently >140/90 or new symptoms notify office sooner 

## 2022-10-03 NOTE — Assessment & Plan Note (Signed)
Controlled cholesterol on statin and lifestyle \\The  10-year ASCVD risk score (Arnett DK, et al., 2019) is: 37.6%  Plan: 1. Continue current meds - Pravastatin 40mg  daily 2. Encourage improved lifestyle - low carb/cholesterol, reduce portion size, continue improving regular exercise

## 2022-10-03 NOTE — Assessment & Plan Note (Signed)
A1c 6.4, stable No hypoglycemia Complications - DM retinopathy hyperlipidemia- increases risk of future cardiovascular complications  - OFF Glipizide  Plan:  1. Continue current therapy - Trulicity 1.5mg  weekly (PAP), Metformin 1000mg  TWICE A DAY - Discussed option to taper down Metformin to 500mg  IR BID 2. Encourage improved lifestyle - low carb, low sugar diet, reduce portion size, continue improving regular exercise 3. Check CBG , bring log to next visit for review 4. Continue ACEi, Statin DIABETES Foot exam Urine micro

## 2022-10-17 ENCOUNTER — Ambulatory Visit: Payer: Medicare HMO

## 2022-10-17 DIAGNOSIS — Z0000 Encounter for general adult medical examination without abnormal findings: Secondary | ICD-10-CM | POA: Diagnosis not present

## 2022-10-17 NOTE — Progress Notes (Signed)
Subjective:   [name]. is a 77 y.o. male who presents for Medicare Annual/Subsequent preventive examination.  Visit Complete: Virtual  I connected with  [name]. on 10/17/22 by a audio enabled telemedicine application and verified that I am speaking with the correct person using two identifiers.  Patient Location: Home  Provider Location: Office/Clinic  I discussed the limitations of evaluation and management by telemedicine. The patient expressed understanding and agreed to proceed.  Because this visit was a virtual/telehealth visit, some criteria may be missing or patient reported. Any vitals not documented were not able to be obtained and vitals that have been documented are patient reported.  Cardiac Risk Factors include: advanced age (>98men, >37 women);diabetes mellitus;dyslipidemia;male gender;hypertension     Objective:    There were no vitals filed for this visit. There is no height or weight on file to calculate BMI.     10/17/2022    2:38 PM 07/11/2020    2:02 PM 10/22/2018   11:29 PM 10/22/2018   12:20 PM 08/05/2017    4:02 PM 05/22/2016    1:37 PM 12/08/2014    2:12 PM  Advanced Directives  Does Patient Have a Medical Advance Directive? No No No No No No No  Would patient like information on creating a medical advance directive? No - Patient declined  No - Patient declined No - Patient declined Yes (MAU/Ambulatory/Procedural Areas - Information given)  No - patient declined information    Current Medications (verified) Outpatient Encounter Medications as of 10/17/2022  Medication Sig   ACCU-CHEK AVIVA PLUS test strip Check sugar up to 2 x daily   Accu-Chek FastClix Lancets MISC Check sugar up to 2 x daily   Accu-Chek Softclix Lancets lancets Check sugar up to 2 x daily   acetaminophen (TYLENOL) 325 MG tablet Take 2 tablets (650 mg total) by mouth every 6 (six) hours as needed for mild pain or headache (fever >/= 101).   Blood Glucose Monitoring Suppl  (ACCU-CHEK AVIVA PLUS) w/Device KIT Use to check blood sugar up to x 2 daily   Dulaglutide (TRULICITY) 1.5 MG/0.5ML SOPN INJECT 1.5 MG (0.5ML) UNDER THE SKIN ONCE A WEEK   lisinopril-hydrochlorothiazide (ZESTORETIC) 20-12.5 MG tablet Take 1 tablet by mouth daily.   metFORMIN (GLUCOPHAGE) 1000 MG tablet TAKE 1 TABLET(1000 MG) BY MOUTH TWICE DAILY WITH A MEAL   mupirocin ointment (BACTROBAN) 2 % Apply 1 application topically 2 (two) times daily. For 1 week as needed for folliculitis infection   pravastatin (PRAVACHOL) 40 MG tablet TAKE 1 TABLET(40 MG) BY MOUTH DAILY   Selenium Sulfide 2.25 % SHAM APPLY EXTERNALLY TO THE AFFECTED AREA DAILY AS NEEDED FOR IRRITATION   sildenafil (REVATIO) 20 MG tablet Take 2-5 pills about 30 min prior to sex.   No facility-administered encounter medications on file as of 10/17/2022.    Allergies (verified) 2,4-d dimethylamine; Tetanus toxoid; and Tetanus-diphtheria toxoids td   History: Past Medical History:  Diagnosis Date   Erectile dysfunction    Hypertension    Right-sided chest pain    Past Surgical History:  Procedure Laterality Date   ROTATOR CUFF REPAIR Right    Family History  Problem Relation Age of Onset   Cancer Brother        Throat   Social History   Socioeconomic History   Marital status: Divorced    Spouse name: Not on file   Number of children: 2   Years of education: Not on file   Highest  education level: Not on file  Occupational History   Occupation: bus driver  Tobacco Use   Smoking status: Former    Current packs/day: 0.00    Types: Cigarettes    Quit date: 03/2021    Years since quitting: 1.5   Smokeless tobacco: Never   Tobacco comments:    pack of cigarettes last 1-2 months - doesnt inhale  Vaping Use   Vaping status: Never Used  Substance and Sexual Activity   Alcohol use: Yes    Alcohol/week: 3.0 standard drinks of alcohol    Types: 3 Cans of beer per week    Comment: 1-2 beers week    Drug use: No    Sexual activity: Not on file  Other Topics Concern   Not on file  Social History Narrative   Working 1-2 days a week .    Social Determinants of Health   Financial Resource Strain: Low Risk  (10/17/2022)   Overall Financial Resource Strain (CARDIA)    Difficulty of Paying Living Expenses: Not hard at all  Food Insecurity: No Food Insecurity (10/17/2022)   Hunger Vital Sign    Worried About Running Out of Food in the Last Year: Never true    Ran Out of Food in the Last Year: Never true  Transportation Needs: No Transportation Needs (10/17/2022)   PRAPARE - Administrator, Civil Service (Medical): No    Lack of Transportation (Non-Medical): No  Physical Activity: Sufficiently Active (10/17/2022)   Exercise Vital Sign    Days of Exercise per Week: 5 days    Minutes of Exercise per Session: 30 min  Stress: No Stress Concern Present (10/17/2022)   [employer] of Occupational Health - Occupational Stress Questionnaire    Feeling of Stress : Not at all  Social Connections: Moderately Isolated (10/17/2022)   Social Connection and Isolation Panel [NHANES]    Frequency of Communication with Friends and Family: More than three times a week    Frequency of Social Gatherings with Friends and Family: Never    Attends Religious Services: More than 4 times per year    Active Member of [employer] or Organizations: No    Attends Engineer, structural: Never    Marital Status: Divorced    Tobacco Counseling Counseling given: Not Answered Tobacco comments: pack of cigarettes last 1-2 months - doesnt inhale   Clinical Intake:  Pre-visit preparation completed: Yes  Pain : No/denies pain     Nutritional Risks: None Diabetes: Yes CBG done?: No Did pt. bring in CBG monitor from home?: No  How often do you need to have someone help you when you read instructions, pamphlets, or other written materials from your doctor or pharmacy?: 1 - Never  Interpreter Needed?:  No  Information entered by :: Kennedy Bucker, LPN   Activities of Daily Living    10/17/2022    2:38 PM  In your present state of health, do you have any difficulty performing the following activities:  Hearing? 0  Vision? 0  Difficulty concentrating or making decisions? 0  Walking or climbing stairs? 0  Dressing or bathing? 0  Doing errands, shopping? 0  Preparing Food and eating ? N  Using the Toilet? N  In the past six months, have you accidently leaked urine? N  Do you have problems with loss of bowel control? N  Managing your Medications? N  Managing your Finances? N  Housekeeping or managing your Housekeeping? N    Patient Care  Team: [name], DO as PCP - General (Family Medicine) Ronney Asters, Jackelyn Poling, RPH-CPP as Pharmacist Gustavus Bryant, LCSW as Social Worker (Licensed Clinical Social Worker)  Indicate any recent [employer] you may have received from other than [facility] providers in the past year (date may be approximate).     Assessment:   This is a routine wellness examination for Kelvion.  Hearing/Vision screen Hearing Screening - Comments:: No aids Vision Screening - Comments:: Wears glasses- Dr.Woodard   Goals Addressed             This Visit's Progress    DIET - EAT MORE FRUITS AND VEGETABLES         Depression Screen    10/17/2022    2:36 PM 02/26/2022    8:30 AM 07/19/2021    3:03 PM 07/11/2020    2:03 PM 05/29/2020   10:45 AM 12/14/2019    2:02 PM 11/26/2019    1:32 PM  PHQ 2/9 Scores  PHQ - 2 Score 0 0 0 0 0 0 0  PHQ- 9 Score 0 0 0  0      Fall Risk    10/17/2022    2:38 PM 02/26/2022    8:30 AM 07/19/2021    3:03 PM 01/16/2021   11:33 AM 07/11/2020    2:03 PM  Fall Risk   Falls in the past year? 0 0 0 0 0  Number falls in past yr: 0 0 0    Injury with Fall? 0 0 0    Risk for fall due to : No Fall Risks No Fall Risks No Fall Risks  Medication side effect  Follow up Falls prevention discussed;Falls evaluation completed  Falls evaluation completed Falls evaluation completed  Falls evaluation completed;Education provided;Falls prevention discussed    MEDICARE RISK AT HOME: Medicare Risk at Home Any stairs in or around the home?: No If so, are there any without handrails?: No Home free of loose throw rugs in walkways, pet beds, electrical cords, etc?: Yes Adequate lighting in your home to reduce risk of falls?: Yes Life alert?: No Use of a cane, walker or w/c?: No Grab bars in the bathroom?: No Shower chair or bench in shower?: No Elevated toilet seat or a handicapped toilet?: No  TIMED UP AND GO:  Was the test performed?  No    Cognitive Function:        10/17/2022    2:39 PM 07/19/2021    3:04 PM 07/11/2020    2:05 PM 08/05/2017    4:05 PM 05/22/2016    1:41 PM  6CIT Screen  What Year? 0 points 0 points 0 points 0 points 0 points  What month? 0 points 0 points 0 points 0 points 0 points  What time? 0 points 0 points 0 points 0 points 0 points  Count back from 20 0 points 0 points 0 points 0 points 0 points  Months in reverse 0 points 0 points 0 points 0 points 0 points  Repeat phrase 0 points 8 points 2 points 0 points 4 points  Total Score 0 points 8 points 2 points 0 points 4 points    Immunizations Immunization History  Administered Date(s) Administered   Fluad Quad(high Dose 65+) 02/26/2022   Fluad Trivalent(High Dose 65+) 10/02/2022   Hepb-cpg 02/02/2020, 03/06/2020   Influenza, High Dose Seasonal PF 11/07/2016, 10/28/2017, 10/02/2018, 11/05/2019, 11/21/2020   Influenza-Unspecified 12/21/2013, 09/06/2015, 10/22/2015   PFIZER(Purple Top)SARS-COV-2 Vaccination 04/23/2019, 05/14/2019, 12/27/2019   PPD Test  01/24/2020   Pneumococcal Conjugate-13 12/08/2014   Pneumococcal Polysaccharide-23 05/06/2012   Tdap 02/04/2014    TDAP status: Up to date  Flu Vaccine status: Up to date  Pneumococcal vaccine status: Up to date  Covid-19 vaccine status: Completed vaccines  Qualifies for  Shingles Vaccine? Yes   Zostavax completed No   Shingrix Completed?: No.    Education has been provided regarding the importance of this vaccine. Patient has been advised to call insurance company to determine out of pocket expense if they have not yet received this vaccine. Advised may also receive vaccine at local pharmacy or Health Dept. Verbalized acceptance and understanding.  Screening Tests Health Maintenance  Topic Date Due   COVID-19 Vaccine (4 - 2023-24 season) 09/22/2022   Zoster Vaccines- Shingrix (1 of 2) 01/01/2023 (Originally [birth year]1996)   OPHTHALMOLOGY EXAM  11/16/2022   FOOT EXAM  02/27/2023   HEMOGLOBIN A1C  04/01/2023   Diabetic kidney evaluation - eGFR measurement  10/02/2023   Diabetic kidney evaluation - Urine ACR  10/02/2023   Medicare Annual Wellness (AWV)  10/17/2023   Fecal DNA (Cologuard)  12/21/2023   DTaP/Tdap/Td (2 - Td or Tdap) 02/05/2024   Pneumonia Vaccine 5+ Years old  Completed   INFLUENZA VACCINE  Completed   Hepatitis C Screening  Completed   HPV VACCINES  Aged Out    Health Maintenance  Health Maintenance Due  Topic Date Due   COVID-19 Vaccine (4 - 2023-24 season) 09/22/2022    Colorectal cancer screening: No longer required.   Lung Cancer Screening: (Low Dose CT Chest recommended if Age 60-80 years, 20 pack-year currently smoking OR have quit w/in 15years.) does not qualify.    Additional Screening:  Hepatitis C Screening: does qualify; Completed 10/31/15  Vision Screening: Recommended annual ophthalmology exams for early detection of glaucoma and other disorders of the eye. Is the patient up to date with their annual eye exam?  Yes  Who is the provider or what is the name of the office in which the patient attends annual eye exams? Dr.Woodard If pt is not established with a provider, would they like to be referred to a provider to establish care? No .   Dental Screening: Recommended annual dental exams for proper oral  hygiene  Diabetic Foot Exam: Diabetic Foot Exam: Completed 02/26/22  Community Resource Referral / Chronic Care Management: CRR required this visit?  No   CCM required this visit?  No     Plan:     I have personally reviewed and noted the following in the patient's chart:   Medical and social history Use of alcohol, tobacco or illicit drugs  Current medications and supplements including opioid prescriptions. Patient is not currently taking opioid prescriptions. Functional ability and status Nutritional status Physical activity Advanced directives List of other physicians Hospitalizations, surgeries, and ER visits in previous 12 months Vitals Screenings to include cognitive, depression, and falls Referrals and appointments  In addition, I have reviewed and discussed with patient certain preventive protocols, quality metrics, and best practice recommendations. A written personalized care plan for preventive services as well as general preventive health recommendations were provided to patient.     Hal Hope, LPN   2/[id number]/5638   After Visit Summary: (MyChart) Due to this being a telephonic visit, the after visit summary with patients personalized plan was offered to patient via MyChart   Nurse Notes: none

## 2022-10-17 NOTE — Patient Instructions (Addendum)
[name] , Thank you for taking time to come for your Medicare Wellness Visit. I appreciate your ongoing commitment to your health goals. Please review the following plan we discussed and let me know if I can assist you in the future.   Referrals/Orders/Follow-Ups/Clinician Recommendations: none  This is a list of the screening recommended for you and due dates:  Health Maintenance  Topic Date Due   COVID-19 Vaccine (4 - 2023-24 season) 09/22/2022   Zoster (Shingles) Vaccine (1 of 2) 01/01/2023*   Eye exam for diabetics  11/16/2022   Complete foot exam   02/27/2023   Hemoglobin A1C  04/01/2023   Yearly kidney function blood test for diabetes  10/02/2023   Yearly kidney health urinalysis for diabetes  10/02/2023   Medicare Annual Wellness Visit  10/17/2023   Cologuard (Stool DNA test)  12/21/2023   DTaP/Tdap/Td vaccine (2 - Td or Tdap) 02/05/2024   Pneumonia Vaccine  Completed   Flu Shot  Completed   Hepatitis C Screening  Completed   HPV Vaccine  Aged Out  *Topic was postponed. The date shown is not the original due date.    Advanced directives: (ACP Link)Information on Advanced Care Planning can be found at [facility] Directives Advance Health Care Directives ([email or web address]   Next Medicare Annual Wellness Visit scheduled for next year: Yes   10/23/23 @ 9:45 am by video

## 2022-11-11 ENCOUNTER — Ambulatory Visit: Payer: Medicare HMO | Admitting: Pharmacist

## 2022-11-11 DIAGNOSIS — E785 Hyperlipidemia, unspecified: Secondary | ICD-10-CM

## 2022-11-11 DIAGNOSIS — E1169 Type 2 diabetes mellitus with other specified complication: Secondary | ICD-10-CM

## 2022-11-11 DIAGNOSIS — I10 Essential (primary) hypertension: Secondary | ICD-10-CM

## 2022-11-11 NOTE — Progress Notes (Signed)
11/11/2022 Name: [name]. MRN: [id number] DOB: 01/19/1946  Chief Complaint  Patient presents with   Medication Assistance   Medication Adherence   Medication Management    Christopher Brisk. is a 77 y.o. year old male who presented for a telephone visit.   They were referred to the pharmacist by their PCP for assistance in managing diabetes, hypertension, hyperlipidemia, and medication access.      Subjective:   Care Team: Primary Care Provider: [name], DO; Next Scheduled Visit: 02/17/2023  Medication Access/Adherence  Current Pharmacy:  [id number] [place], [place] - [address] AT [facility] OF [address] [place] [address] Phone: [phone] Fax: [phone]  [facility] [id number] [place], [place] - [address] [place] [address] Phone: [phone] Fax: [phone]  [facility] - [place], [place] - [address] [place] [address] Phone: [phone] Fax: [phone]   Patient reports affordability concerns with their medications: No  Patient reports access/transportation concerns to their pharmacy: No  Patient reports adherence concerns with their medications:  No      Reports using his weekly pillbox to aid with adherence Reports rarely misses dose of metformin in evening     Diabetes:   Current medications:  Metformin 1000 mg twice daily Trulicity 1.5 mg weekly on Mondays   Previous medication tried: glipizide   Current glucose readings: ranging 90-120     Patient denies hypoglycemic s/sx including dizziness, shakiness, sweating.      Current medication access support: Enrolled for Trulicity through [name] patient assistance program through 01/21/2023     Hypertension:   Current medications: lisinopril/HCTZ 20-12.5 mg - 1 tablet daily    Patient has a validated, automated, upper arm home BP  cuff,   Last checked this weekend, recalls reading ~125/80       Hyperlipidemia/ASCVD Risk Reduction   Current lipid lowering medications: pravastatin 40 mg daily Medications tried in the past: pravastatin 10 mg   Objective:  Lab Results  Component Value Date   HGBA1C 6.4 (A) 10/02/2022    Lab Results  Component Value Date   CREATININE 1.06 10/02/2022   BUN 12 10/02/2022   NA 136 10/02/2022   K 4.3 10/02/2022   CL 100 10/02/2022   CO2 27 10/02/2022    Lab Results  Component Value Date   CHOL 195 10/02/2022   HDL 104 10/02/2022   LDLCALC 75 10/02/2022   TRIG 80 10/02/2022   CHOLHDL 1.9 10/02/2022   BP Readings from Last 3 Encounters:  10/02/22 138/70  02/26/22 115/70  07/31/21 131/75   Pulse Readings from Last 3 Encounters:  10/02/22 73  02/26/22 74  07/31/21 63    Medications Reviewed Today     Reviewed by Manuela Neptune, RPH-CPP (Pharmacist) on 11/11/22 at 1642  Med List Status: <None>   Medication Order Taking? Sig Documenting Provider Last Dose Status Informant  ACCU-CHEK AVIVA PLUS test strip [id number]  Check sugar up to 2 x daily [name], DO  Active Pharmacy Records  Accu-Chek FastClix Lancets MISC [id number]  Check sugar up to 2 x daily [name], DO  Active Pharmacy Records  Accu-Chek Softclix Lancets lancets [id number]  Check sugar up to 2 x daily [name], DO  Active Pharmacy Records  acetaminophen (TYLENOL) 325 MG tablet [id number]  Take 2 tablets (650  mg total) by mouth every 6 (six) hours as needed for mild pain or headache (fever >/= 101). Lonia Blood, MD  Active   Blood Glucose Monitoring Suppl (ACCU-CHEK AVIVA PLUS) w/Device KIT [id number]  Use to check blood sugar up to x 2 daily Althea Charon Netta Neat, DO  Active Pharmacy Records  Dulaglutide (TRULICITY) 1.5 MG/0.5ML Namon Cirri [id number] Yes INJECT 1.5 MG (0.5ML) UNDER THE SKIN ONCE A WEEK [name], Netta Neat, DO Taking Active    lisinopril-hydrochlorothiazide (ZESTORETIC) 20-12.5 MG tablet [id number] Yes Take 1 tablet by mouth daily. [name], DO Taking Active   metFORMIN (GLUCOPHAGE) 1000 MG tablet [id number] Yes TAKE 1 TABLET(1000 MG) BY MOUTH TWICE DAILY WITH A MEAL Baity, Salvadore Oxford, NP Taking Active   mupirocin ointment (BACTROBAN) 2 % [id number]  Apply 1 application topically 2 (two) times daily. For 1 week as needed for folliculitis infection [name], DO  Active   pravastatin (PRAVACHOL) 40 MG tablet [id number] Yes TAKE 1 TABLET(40 MG) BY MOUTH DAILY [name], DO Taking Active   Selenium Sulfide 2.25 % SHAM [id number]  APPLY EXTERNALLY TO THE AFFECTED AREA DAILY AS NEEDED FOR IRRITATION Althea Charon Netta Neat, DO  Active   sildenafil (REVATIO) 20 MG tablet [id number]  Take 2-5 pills about 30 min prior to sex. [name], DO  Active               Assessment/Plan:   Counsel patient on importance of medication adherence and encourage him to continue using weekly pillbox     Diabetes: - Have counseled on impact of diet and exercise on blood sugar - Counseled on importance of having regular well-balanced meals and limiting carbohydrate portion sizes - Recommend to check glucose, keep log of results and have this record to review at upcoming appointments - Remind patient to follow up with [name] patient assistance program if needed for refills of Trulicity - Remind patient to follow up with [employer] as needed for refill of his diabetes testing supplies - Will collaborate with PCP and CPhT to aid patient with re-enrollment in Trulicity patient assistance program from [place] for 2025 calendar year   Hypertension: - Recommended to check home blood pressure and heart rate, keep log of results and have record to review during upcoming appointments     Hyperlipidemia/ASCVD Risk Reduction: - Currently uncontrolled.  - Encourage patient to continue  using weekly pillbox     Follow Up Plan: Clinical Pharmacist to follow up with patient by telephone again on 02/10/2023 at 4:30 PM    Estelle Grumbles, PharmD, Patsy [place], CPP Clinical Pharmacist [facility] [phone]

## 2022-11-11 NOTE — Patient Instructions (Signed)
Goals Addressed             This Visit's Progress    Pharmacy Goals       Our goal A1c is less than 7%. This corresponds with fasting sugars less than 130 and 2 hour after meal sugars less than 180. Please check your blood sugar and keep a record  Please check your home blood pressure, keep a log of the results and bring this with you to your medical appointments.  Our goal bad cholesterol, or LDL, is less than 70 . This is why it is important to continue taking your pravastatin 40 mg daily  Please use your weekly pillbox to organize your medications  Feel free to call me with any questions or concerns. I look forward to our next call!  Lenyx Boody Theordore Cisnero, PharmD, BCACP, CPP Clinical Pharmacist [facility] [place] [phone]          

## 2022-11-15 ENCOUNTER — Telehealth: Payer: Self-pay | Admitting: Pharmacy Technician

## 2022-11-15 DIAGNOSIS — Z5986 Financial insecurity: Secondary | ICD-10-CM

## 2022-11-15 NOTE — Progress Notes (Signed)
[facility] Customer service manager [facility])                                            [facility] Quality Pharmacy Team    11/15/2022  Katelyn Braxton 1945/05/30 [id number]                                      Medication Assistance Referral  Referral From:  [facility] PharmD Estelle Grumbles  Medication/Company: Trudee Kuster / [name] Patient application portion:  Mailed Provider application portion: Faxed  to Dr. Saralyn Pilar Provider address/fax verified via: Office website  Pattricia Boss, CPhT [place]  Office: [phone] Fax: [phone] Email: [email or web address]

## 2022-12-03 ENCOUNTER — Other Ambulatory Visit: Payer: Self-pay | Admitting: Family Medicine

## 2022-12-03 DIAGNOSIS — I10 Essential (primary) hypertension: Secondary | ICD-10-CM

## 2022-12-04 NOTE — Telephone Encounter (Signed)
Requested Prescriptions  Pending Prescriptions Disp Refills   lisinopril-hydrochlorothiazide (ZESTORETIC) 20-12.5 MG tablet [Pharmacy Med Name: LISINOPRIL-HCTZ 20/12.5MG  TABLETS] 90 tablet 3    Sig: TAKE 1 TABLET BY MOUTH DAILY     Cardiovascular:  ACEI + Diuretic Combos Passed - 12/03/2022  5:16 PM      Passed - Na in normal range and within 180 days    Sodium  Date Value Ref Range Status  10/02/2022 136 135 - 146 mmol/L Final  05/10/2015 141 134 - 144 mmol/L Final         Passed - K in normal range and within 180 days    Potassium  Date Value Ref Range Status  10/02/2022 4.3 3.5 - 5.3 mmol/L Final         Passed - Cr in normal range and within 180 days    Creat  Date Value Ref Range Status  10/02/2022 1.06 0.70 - 1.28 mg/dL Final   Creatinine, POC  Date Value Ref Range Status  04/05/2015 0 mg/dL Final   Creatinine, Urine  Date Value Ref Range Status  10/02/2022 90 20 - 320 mg/dL Final         Passed - eGFR is 30 or above and within 180 days    GFR, Est African American  Date Value Ref Range Status  12/06/2019 76 > OR = 60 mL/min/1.18m2 Final   GFR, Est Non African American  Date Value Ref Range Status  12/06/2019 66 > OR = 60 mL/min/1.57m2 Final   eGFR  Date Value Ref Range Status  10/02/2022 73 > OR = 60 mL/min/1.23m2 Final         Passed - Patient is not pregnant      Passed - Last BP in normal range    BP Readings from Last 1 Encounters:  10/02/22 138/70         Passed - Valid encounter within last 6 months    Recent Outpatient Visits           3 weeks ago Type 2 diabetes mellitus with other specified complication, without long-term current use of insulin (HCC)   [place] [facility] Delles, Jackelyn Poling, RPH-CPP   2 months ago Annual physical exam   [place] [facility] [name], DO   5 months ago Type 2 diabetes mellitus with other specified complication, without long-term current use of insulin  (HCC)   [place] [facility] Delles, [name] A, RPH-CPP   9 months ago Type 2 diabetes mellitus with other specified complication, without long-term current use of insulin [facility])   [place] [facility] [place], Netta Neat, DO   9 months ago Type 2 diabetes mellitus with other specified complication, without long-term current use of insulin (HCC)   [place] [facility] Delles, Jackelyn Poling, RPH-CPP       Future Appointments             In 2 months Althea Charon, Netta Neat, DO [place] [facility], [facility]

## 2022-12-16 ENCOUNTER — Other Ambulatory Visit: Payer: Self-pay | Admitting: Family Medicine

## 2022-12-16 DIAGNOSIS — E113293 Type 2 diabetes mellitus with mild nonproliferative diabetic retinopathy without macular edema, bilateral: Secondary | ICD-10-CM

## 2022-12-17 NOTE — Telephone Encounter (Signed)
Requested Prescriptions  Pending Prescriptions Disp Refills   Dulaglutide (TRULICITY) 1.5 MG/0.5ML SOAJ [Pharmacy Med Name: Trulicity Subcutaneous Solution Auto-injector 1.5 MG/0.5ML] 6 mL 0    Sig: INJECT 1.5 MG (0.5ML) UNDER THE SKIN ONCE A WEEK     Endocrinology:  Diabetes - GLP-1 Receptor Agonists Passed - 12/16/2022  9:02 AM      Passed - HBA1C is between 0 and 7.9 and within 180 days    Hemoglobin A1C  Date Value Ref Range Status  10/02/2022 6.4 (A) 4.0 - 5.6 % Final   Hgb A1c MFr Bld  Date Value Ref Range Status  11/27/2020 6.4 (H) <5.7 % of total Hgb Final    Comment:    For someone without known diabetes, a hemoglobin  A1c value between 5.7% and 6.4% is consistent with prediabetes and should be confirmed with a  follow-up test. . For someone with known diabetes, a value <7% indicates that their diabetes is well controlled. A1c targets should be individualized based on duration of diabetes, age, comorbid conditions, and other considerations. . This assay result is consistent with an increased risk of diabetes. . Currently, no consensus exists regarding use of hemoglobin A1c for diagnosis of diabetes for children. Verna Czech - Valid encounter within last 6 months    Recent Outpatient Visits           1 month ago Type 2 diabetes mellitus with other specified complication, without long-term current use of insulin [facility])   [place] [facility] Delles, Jackelyn Poling, RPH-CPP   2 months ago Annual physical exam   [place] [facility] [place], Netta Neat, DO   5 months ago Type 2 diabetes mellitus with other specified complication, without long-term current use of insulin [facility])   [place] [facility] Delles, [name] A, RPH-CPP   9 months ago Type 2 diabetes mellitus with other specified complication, without long-term current use of insulin [facility])   [place] [facility] [name], DO   10 months ago Type 2 diabetes mellitus with other specified complication, without long-term current use of insulin (HCC)   [place] [facility] Delles, Jackelyn Poling, RPH-CPP       Future Appointments             In 2 months Althea Charon, Netta Neat, DO  [facility], [facility]

## 2022-12-30 ENCOUNTER — Telehealth: Payer: Self-pay

## 2022-12-30 NOTE — Telephone Encounter (Signed)
Attempted to reach patient regarding trulicity patient assistance samples.  No voicemail.  Will send a mychart message as well.

## 2023-02-07 ENCOUNTER — Other Ambulatory Visit: Payer: Medicare HMO | Admitting: Pharmacist

## 2023-02-07 ENCOUNTER — Other Ambulatory Visit: Payer: Self-pay | Admitting: Family Medicine

## 2023-02-07 DIAGNOSIS — E1169 Type 2 diabetes mellitus with other specified complication: Secondary | ICD-10-CM

## 2023-02-07 NOTE — Progress Notes (Signed)
   02/07/2023  Patient ID: Christopher Bruins., male   DOB: January 19, 1946, 78 y.o.   MRN: [id number]  Receive a phone call from patient. Advises that he has not yet returned Trulicity patient assistance re-enrollment application for 2025 to [facility] as is waiting until has gathered latest financial documents to reflect current income.  - Will provide update to CPhT  Confirms picks up latest refill of Trulicity from assistance program from office in December.     Follow Up Plan: Clinical Pharmacist to follow up with patient by telephone again next month.  Estelle Grumbles, PharmD, Patsy [place], CPP Clinical Pharmacist [facility] [phone]

## 2023-02-07 NOTE — Telephone Encounter (Signed)
Requested Prescriptions  Pending Prescriptions Disp Refills   pravastatin (PRAVACHOL) 40 MG tablet [Pharmacy Med Name: PRAVASTATIN 40MG  TABLETS] 90 tablet 1    Sig: TAKE 1 TABLET(40 MG) BY MOUTH DAILY     Cardiovascular:  Antilipid - Statins Failed - 02/07/2023  5:32 PM      Failed - Lipid Panel in normal range within the last 12 months    Cholesterol, Total  Date Value Ref Range Status  05/10/2015 204 (H) 100 - 199 mg/dL Final   Cholesterol  Date Value Ref Range Status  10/02/2022 195 <200 mg/dL Final   LDL Cholesterol (Calc)  Date Value Ref Range Status  10/02/2022 75 mg/dL (calc) Final    Comment:    Reference range: <100 . Desirable range <100 mg/dL for primary prevention;   <70 mg/dL for patients with CHD or diabetic patients  with > or = 2 CHD risk factors. [name] LDL-C is now calculated using the Martin-Hopkins  calculation, which is a validated novel method providing  better accuracy than the Friedewald equation in the  estimation of LDL-C.  Horald Pollen et al. Lenox Ahr. 1610;960(45): 2061-2068  (http://education.[employer].com/faq/FAQ164)    HDL  Date Value Ref Range Status  10/02/2022 104 > OR = 40 mg/dL Final  40/98/1191 478 >29 mg/dL Final   Triglycerides  Date Value Ref Range Status  10/02/2022 80 <150 mg/dL Final         Passed - Patient is not pregnant      Passed - Valid encounter within last 12 months    Recent Outpatient Visits           2 months ago Type 2 diabetes mellitus with other specified complication, without long-term current use of insulin (HCC)   [facility] Delles, Jackelyn Poling, RPH-CPP   4 months ago Annual physical exam   [place] [facility] [place], Netta Neat, DO   7 months ago Type 2 diabetes mellitus with other specified complication, without long-term current use of insulin [facility])   [place] [facility] Delles, [name] A, RPH-CPP   11 months ago Type 2  diabetes mellitus with other specified complication, without long-term current use of insulin [facility])   [place] [facility] [name], DO   11 months ago Type 2 diabetes mellitus with other specified complication, without long-term current use of insulin (HCC)   [place] [facility] Delles, Jackelyn Poling, RPH-CPP       Future Appointments             In 1 week Althea Charon, Netta Neat, DO [place] [facility], [facility]

## 2023-02-07 NOTE — Patient Instructions (Signed)
Goals Addressed             This Visit's Progress    Pharmacy Goals       Our goal A1c is less than 7%. This corresponds with fasting sugars less than 130 and 2 hour after meal sugars less than 180. Please check your blood sugar and keep a record  Please check your home blood pressure, keep a log of the results and bring this with you to your medical appointments.  Our goal bad cholesterol, or LDL, is less than 70 . This is why it is important to continue taking your pravastatin 40 mg daily  Please use your weekly pillbox to organize your medications  Feel free to call me with any questions or concerns. I look forward to our next call!  Lenyx Boody Theordore Cisnero, PharmD, BCACP, CPP Clinical Pharmacist [facility] [place] [phone]          

## 2023-02-10 ENCOUNTER — Other Ambulatory Visit: Payer: Medicare HMO

## 2023-02-17 ENCOUNTER — Ambulatory Visit: Payer: Medicare HMO | Admitting: Family Medicine

## 2023-02-17 ENCOUNTER — Ambulatory Visit (INDEPENDENT_AMBULATORY_CARE_PROVIDER_SITE_OTHER): Payer: Medicare HMO | Admitting: Family Medicine

## 2023-02-17 ENCOUNTER — Other Ambulatory Visit: Payer: Self-pay | Admitting: Family Medicine

## 2023-02-17 ENCOUNTER — Encounter: Payer: Self-pay | Admitting: Family Medicine

## 2023-02-17 VITALS — BP 140/78 | HR 78 | Ht 68.0 in | Wt 193.0 lb

## 2023-02-17 DIAGNOSIS — R351 Nocturia: Secondary | ICD-10-CM

## 2023-02-17 DIAGNOSIS — E1169 Type 2 diabetes mellitus with other specified complication: Secondary | ICD-10-CM

## 2023-02-17 DIAGNOSIS — E785 Hyperlipidemia, unspecified: Secondary | ICD-10-CM

## 2023-02-17 DIAGNOSIS — I10 Essential (primary) hypertension: Secondary | ICD-10-CM

## 2023-02-17 DIAGNOSIS — E113293 Type 2 diabetes mellitus with mild nonproliferative diabetic retinopathy without macular edema, bilateral: Secondary | ICD-10-CM | POA: Diagnosis not present

## 2023-02-17 DIAGNOSIS — Z0000 Encounter for general adult medical examination without abnormal findings: Secondary | ICD-10-CM

## 2023-02-17 LAB — POCT GLYCOSYLATED HEMOGLOBIN (HGB A1C): Hemoglobin A1C: 6.4 % — AB (ref 4.0–5.6)

## 2023-02-17 MED ORDER — METFORMIN HCL 1000 MG PO TABS: 1000.0000 mg | ORAL_TABLET | Freq: Two times a day (BID) | ORAL | 3 refills | Status: DC

## 2023-02-17 NOTE — Patient Instructions (Addendum)
Thank you for coming to the office today.  Recent Labs    02/26/22 0838 10/02/22 1331 02/17/23 0856  HGBA1C 5.2 6.4* 6.4*   [facility]   Address: [address] [place], [place] [address] Phone: [phone]  Website: [email or web address]  Elevated BP today top number 140, goal is < 140  We have adjusted BP medication to increased dosage.  Please check BP at home and report readings if average < 140 / 90, we are in control if higher, then we will need to re order the higher dose of the BP medication.  DUE for FASTING BLOOD WORK (no food or drink after midnight before the lab appointment, only water or coffee without cream/sugar on the morning of)  SCHEDULE "Lab Only" visit in the morning at the clinic for lab draw in 6 MONTHS  1 YEAR   Make sure Lab Only appointment is at about 1 week before your next appointment, so that results will be available  For Lab Results, once available within 2-3 days of blood draw, you can can log in to MyChart online to view your results and a brief explanation. Also, we can discuss results at next follow-up visit.   Please schedule a Follow-up Appointment to: Return in about 6 months (around 08/17/2023) for 6 month fasting lab > 1 week later Annual Physical.  If you have any other questions or concerns, please feel free to call the office or send a message through MyChart. You may also schedule an earlier appointment if necessary.  Additionally, you may be receiving a survey about your experience at our office within a few days to 1 week by e-mail or mail. We value your feedback.  Saralyn Pilar, DO [facility], [place]

## 2023-02-17 NOTE — Progress Notes (Signed)
Subjective:    Patient ID: [name]., male    DOB: 23-Nov-1945, 78 y.o.   MRN: [id number]  Savas Elvin. is a 78 y.o. male presenting on 02/17/2023 for Diabetes   HPI  Discussed the use of AI scribe software for clinical note transcription with the patient, who gave verbal consent to proceed.  History of Present Illness     The patient, a known diabetic, presented for a routine check-up and to obtain a letter documenting his recent HbA1c result for a Department of Transportation (DOT) certification.      CHRONIC DM, Type 2 w/ Neuropathy See prior notes for background info - Today patient reports he is doing very well overall. No new major concerns. No further significant hypoglycemia readings.  A1c stable at 6.4 DOT upcoming - Trulicity 1.5mg  weekly injection (on PAP for financial support) - Metformin 1000mg  BID - OFF Glipizide Reports good compliance. Tolerating well w/o side-effects Currently on ACEi Lifestyle: Weight stable - Diet (improve diet) - Exercise (Limited exercise due to time - gradually improving) -Due for repeat Diabetic eye exam visit - he will schedule. Previously Dr Clydene Pugh last DM Eye exam 11/15/21 no DM Retinopathy Denies hypoglycemia, polyuria, visual changes, numbness or tingling.   CHRONIC HTN: Controlled BP but seems occasional elevated, like today for example. Current Meds - Lisinopril-HCTZ 20-12.5mg  - whole tab pill daily Reports good compliance, took meds today. Tolerating well, w/o complaints. Denies CP, dyspnea, HA, edema, dizziness / lightheadedness    HYPERLIPIDEMIA: - Reports no concerns. Last lipid panel 2024, controlled  - Currently taking Pravastatin 40mg , tolerating well without side effects or myalgias       10/17/2022    2:36 PM 02/26/2022    8:30 AM 07/19/2021    3:03 PM  Depression screen PHQ 2/9  Decreased Interest 0 0 0  Down, Depressed, Hopeless 0 0 0  PHQ - 2 Score 0 0 0  Altered sleeping 0 0 0  Tired, decreased  energy 0 0 0  Change in appetite 0 0 0  Feeling bad or failure about yourself  0 0 0  Trouble concentrating 0 0 0  Moving slowly or fidgety/restless 0 0 0  Suicidal thoughts 0 0 0  PHQ-9 Score 0 0 0  Difficult doing work/chores Not difficult at all Not difficult at all Not difficult at all       02/26/2022    8:30 AM  GAD 7 : Generalized Anxiety Score  Nervous, Anxious, on Edge 0  Control/stop worrying 0  Worry too much - different things 0  Trouble relaxing 0  Restless 0  Easily annoyed or irritable 0  Afraid - awful might happen 0  Total GAD 7 Score 0  Anxiety Difficulty Not difficult at all    Social History   Tobacco Use   Smoking status: Former    Current packs/day: 0.00    Types: Cigarettes    Quit date: 03/2021    Years since quitting: 1.9   Smokeless tobacco: Never   Tobacco comments:    pack of cigarettes last 1-2 months - doesnt inhale  Vaping Use   Vaping status: Never Used  Substance Use Topics   Alcohol use: Yes    Alcohol/week: 3.0 standard drinks of alcohol    Types: 3 Cans of beer per week    Comment: 1-2 beers week    Drug use: No    Review of Systems Per HPI unless specifically indicated above  Objective:    BP (!) 140/78 (BP Location: Left Arm, Cuff Size: Normal)   Pulse 78   Ht 5\' 8"  (1.727 m)   Wt 193 lb (87.5 kg)   SpO2 100%   BMI 29.35 kg/m   Wt Readings from Last 3 Encounters:  02/17/23 193 lb (87.5 kg)  10/02/22 191 lb (86.6 kg)  02/26/22 185 lb 9.6 oz (84.2 kg)    Physical Exam Vitals and nursing note reviewed.  Constitutional:      General: He is not in acute distress.    Appearance: He is well-developed. He is not diaphoretic.     Comments: Well-appearing, comfortable, cooperative  HENT:     Head: Normocephalic and atraumatic.  Eyes:     General:        Right eye: No discharge.        Left eye: No discharge.     Conjunctiva/sclera: Conjunctivae normal.  Neck:     Thyroid: No thyromegaly.  Cardiovascular:      Rate and Rhythm: Normal rate and regular rhythm.     Pulses: Normal pulses.     Heart sounds: Normal heart sounds. No murmur heard. Pulmonary:     Effort: Pulmonary effort is normal. No respiratory distress.     Breath sounds: Normal breath sounds. No wheezing or rales.  Musculoskeletal:        General: Normal range of motion.     Cervical back: Normal range of motion and neck supple.  Lymphadenopathy:     Cervical: No cervical adenopathy.  Skin:    General: Skin is warm and dry.     Findings: No erythema or rash.  Neurological:     Mental Status: He is alert and oriented to person, place, and time. Mental status is at baseline.  Psychiatric:        Behavior: Behavior normal.     Comments: Well groomed, good eye contact, normal speech and thoughts    Recent Labs    02/26/22 0838 10/02/22 1331 02/17/23 0856  HGBA1C 5.2 6.4* 6.4*     Diabetic Foot Exam - Simple   Simple Foot Form Diabetic Foot exam was performed with the following findings: Yes 02/17/2023  9:00 AM  Visual Inspection See comments: Yes Sensation Testing Intact to touch and monofilament testing bilaterally: Yes Pulse Check Posterior Tibialis and Dorsalis pulse intact bilaterally: Yes Comments Mild dry skin dermatitis. No focal ulceration or callus formation. Mild thickened great toenails. No other abnormalitiy      Results for orders placed or performed in visit on 02/17/23  POCT HgB A1C   Collection Time: 02/17/23  8:56 AM  Result Value Ref Range   Hemoglobin A1C 6.4 (A) 4.0 - 5.6 %   HbA1c POC (<> result, manual entry)     HbA1c, POC (prediabetic range)     HbA1c, POC (controlled diabetic range)        Assessment & Plan:   Problem List Items Addressed This Visit     Essential hypertension   Well-controlled HYPERTENSION previously. Today mild elevated - Home BP readings reviewed    Plan:  1.  Suggest dose increase with double dose of current BP regimen Lisinopril-HCTZ 20-12.5mg  x 2 = 40-25mg   dosing daily for now  2. Encourage improved lifestyle - low sodium diet, regular exercise 3. Continue monitor BP outside office, bring readings to next visit, if persistently >140/90 or new symptoms notify office sooner  May adjust medication if prefer higher dosing. Or consider add 2nd agent,  consider Amlodipine if indicated as next option.      Hyperlipidemia associated with type 2 diabetes mellitus (HCC)   Controlled cholesterol on statin and lifestyle  Plan: 1. Continue current meds - Pravastatin 40mg  daily 2. Encourage improved lifestyle - low carb/cholesterol, reduce portion size, continue improving regular exercise      Relevant Medications   metFORMIN (GLUCOPHAGE) 1000 MG tablet   Type 2 diabetes mellitus with other specified complication (HCC) - Primary   A1c 6.4, stable No hypoglycemia Complications - DM retinopathy hyperlipidemia- increases risk of future cardiovascular complications  - OFF Glipizide  Plan:  1. Continue current therapy - Trulicity 1.5mg  weekly (PAP), Metformin 1000mg  TWICE A DAY 2. Encourage improved lifestyle - low carb, low sugar diet, reduce portion size, continue improving regular exercise 3. Check CBG , bring log to next visit for review 4. Continue ACEi, Statin DIABETES Foot exam Recommend DM Eye Exam.      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 Clydene Pugh. -Next colon test due in November 2025. -Offered CT scan of the chest to check for blockages, patient to consider and inform decision.    Orders Placed This Encounter   Procedures   POCT HgB A1C    Meds ordered this encounter  Medications   metFORMIN (GLUCOPHAGE) 1000 MG tablet    Sig: Take 1 tablet (1,000 mg total) by mouth 2 (two) times daily with a meal.    Dispense:  180 tablet    Refill:  3    Follow up plan: Return in about 6 months (around 08/17/2023) for 6 month fasting lab > 1 week later Annual Physical.  Future labs ordered for 08/12/23  Saralyn Pilar, DO [facility] 02/17/2023, 8:58 AM

## 2023-02-17 NOTE — Assessment & Plan Note (Signed)
Well-controlled HYPERTENSION previously. Today mild elevated - Home BP readings reviewed    Plan:  1.  Suggest dose increase with double dose of current BP regimen Lisinopril-HCTZ 20-12.5mg  x 2 = 40-25mg  dosing daily for now  2. Encourage improved lifestyle - low sodium diet, regular exercise 3. Continue monitor BP outside office, bring readings to next visit, if persistently >140/90 or new symptoms notify office sooner  May adjust medication if prefer higher dosing. Or consider add 2nd agent, consider Amlodipine if indicated as next option.

## 2023-02-17 NOTE — Assessment & Plan Note (Signed)
A1c 6.4, stable No hypoglycemia Complications - DM retinopathy hyperlipidemia- increases risk of future cardiovascular complications  - OFF Glipizide  Plan:  1. Continue current therapy - Trulicity 1.5mg  weekly (PAP), Metformin 1000mg  TWICE A DAY 2. Encourage improved lifestyle - low carb, low sugar diet, reduce portion size, continue improving regular exercise 3. Check CBG , bring log to next visit for review 4. Continue ACEi, Statin DIABETES Foot exam Recommend DM Eye Exam.

## 2023-02-17 NOTE — Assessment & Plan Note (Signed)
Controlled cholesterol on statin and lifestyle  Plan: 1. Continue current meds - Pravastatin 40mg  daily 2. Encourage improved lifestyle - low carb/cholesterol, reduce portion size, continue improving regular exercise

## 2023-02-24 ENCOUNTER — Ambulatory Visit: Payer: Medicare HMO | Admitting: Internal Medicine

## 2023-02-24 ENCOUNTER — Encounter: Payer: Self-pay | Admitting: Internal Medicine

## 2023-02-24 ENCOUNTER — Ambulatory Visit: Payer: Medicare HMO | Admitting: Family Medicine

## 2023-02-24 VITALS — BP 138/78 | HR 66 | Temp 98.2°F | Ht 68.0 in | Wt 196.0 lb

## 2023-02-24 DIAGNOSIS — J069 Acute upper respiratory infection, unspecified: Secondary | ICD-10-CM

## 2023-02-24 DIAGNOSIS — R051 Acute cough: Secondary | ICD-10-CM

## 2023-02-24 LAB — POC COVID19/FLU A&B COMBO
Covid Antigen, POC: NEGATIVE
Influenza A Antigen, POC: NEGATIVE
Influenza B Antigen, POC: NEGATIVE

## 2023-02-24 MED ORDER — AZITHROMYCIN 250 MG PO TABS: ORAL_TABLET | ORAL | 0 refills | Status: DC

## 2023-02-24 MED ORDER — IPRATROPIUM BROMIDE 0.06 % NA SOLN: 2.0000 | Freq: Four times a day (QID) | NASAL | 0 refills | Status: AC

## 2023-02-24 NOTE — Patient Instructions (Signed)

## 2023-02-24 NOTE — Progress Notes (Signed)
Subjective:    Patient ID: Christopher Bruins., male    DOB: 03-16-1945, 78 y.o.   MRN: [id number]  HPI  Discussed the use of AI scribe software for clinical note transcription with the patient, who gave verbal consent to proceed.  The patient presents with nasal congestion and cough.  Nasal congestion and cough have persisted for five days. He describes his chest as 'clogged up' and notes the presence of mucus and occasional epistaxis when blowing his nose. The mucus is described as colored. He also experiences a runny nose and had a sore throat at one time. Symptoms worsen at night, particularly the coughing and breathing difficulties. No nausea, vomiting, diarrhea, fever, chills, or body aches.  He has been using Alka-Seltzer and Sudafed to manage his symptoms, which provides some relief. He does not have known allergies and is unaware of anyone around him being sick.       Review of Systems   Past Medical History:  Diagnosis Date   Erectile dysfunction    Hypertension    Right-sided chest pain     Current Outpatient Medications  Medication Sig Dispense Refill   ACCU-CHEK AVIVA PLUS test strip Check sugar up to 2 x daily 200 each 5   Accu-Chek FastClix Lancets MISC Check sugar up to 2 x daily 102 each 3   Accu-Chek Softclix Lancets lancets Check sugar up to 2 x daily 200 each 5   acetaminophen (TYLENOL) 325 MG tablet Take 2 tablets (650 mg total) by mouth every 6 (six) hours as needed for mild pain or headache (fever >/= 101).     Blood Glucose Monitoring Suppl (ACCU-CHEK AVIVA PLUS) w/Device KIT Use to check blood sugar up to x 2 daily 1 kit 0   Dulaglutide (TRULICITY) 1.5 MG/0.5ML SOAJ INJECT 1.5 MG (0.5ML) UNDER THE SKIN ONCE A WEEK 6 mL 0   lisinopril-hydrochlorothiazide (ZESTORETIC) 20-12.5 MG tablet TAKE 1 TABLET BY MOUTH DAILY 90 tablet 3   metFORMIN (GLUCOPHAGE) 1000 MG tablet Take 1 tablet (1,000 mg total) by mouth 2 (two) times daily with a meal. 180 tablet 3    mupirocin ointment (BACTROBAN) 2 % Apply 1 application topically 2 (two) times daily. For 1 week as needed for folliculitis infection 30 g 1   pravastatin (PRAVACHOL) 40 MG tablet TAKE 1 TABLET(40 MG) BY MOUTH DAILY 90 tablet 1   Selenium Sulfide 2.25 % SHAM APPLY EXTERNALLY TO THE AFFECTED AREA DAILY AS NEEDED FOR IRRITATION 180 mL 0   sildenafil (REVATIO) 20 MG tablet Take 2-5 pills about 30 min prior to sex. 60 tablet 5   No current facility-administered medications for this visit.    Allergies  Allergen Reactions   2,4-D Dimethylamine Other (See Comments)   Tetanus Toxoid Nausea And Vomiting   Tetanus-Diphtheria Toxoids Td Other (See Comments)    Family History  Problem Relation Age of Onset   Cancer Brother        Throat    Social History   Socioeconomic History   Marital status: Divorced    Spouse name: Not on file   Number of children: 2   Years of education: Not on file   Highest education level: Not on file  Occupational History   Occupation: bus driver  Tobacco Use   Smoking status: Former    Current packs/day: 0.00    Types: Cigarettes    Quit date: 03/2021    Years since quitting: 1.9   Smokeless tobacco: Never   Tobacco comments:  pack of cigarettes last 1-2 months - doesnt inhale  Vaping Use   Vaping status: Never Used  Substance and Sexual Activity   Alcohol use: Yes    Alcohol/week: 3.0 standard drinks of alcohol    Types: 3 Cans of beer per week    Comment: 1-2 beers week    Drug use: No   Sexual activity: Not on file  Other Topics Concern   Not on file  Social History Narrative   Working 1-2 days a week .    Social Drivers of Corporate investment banker Strain: Low Risk  (10/17/2022)   Overall Financial Resource Strain (CARDIA)    Difficulty of Paying Living Expenses: Not hard at all  Food Insecurity: No Food Insecurity (10/17/2022)   Hunger Vital Sign    Worried About Running Out of Food in the Last Year: Never true    Ran Out of Food  in the Last Year: Never true  Transportation Needs: No Transportation Needs (10/17/2022)   PRAPARE - Administrator, Civil Service (Medical): No    Lack of Transportation (Non-Medical): No  Physical Activity: Sufficiently Active (10/17/2022)   Exercise Vital Sign    Days of Exercise per Week: 5 days    Minutes of Exercise per Session: 30 min  Stress: No Stress Concern Present (10/17/2022)   [employer] of Occupational Health - Occupational Stress Questionnaire    Feeling of Stress : Not at all  Social Connections: Moderately Isolated (10/17/2022)   Social Connection and Isolation Panel [NHANES]    Frequency of Communication with Friends and Family: More than three times a week    Frequency of Social Gatherings with Friends and Family: Never    Attends Religious Services: More than 4 times per year    Active Member of [employer] or Organizations: No    Attends Banker Meetings: Never    Marital Status: Divorced  Catering manager Violence: Not At Risk (10/17/2022)   Humiliation, Afraid, Rape, and Kick questionnaire    Fear of Current or Ex-Partner: No    Emotionally Abused: No    Physically Abused: No    Sexually Abused: No     Constitutional: Denies fever, malaise, fatigue, headache or abrupt weight changes.  HEENT: Pt reports runny nose, nasal congestion and sore throat. Denies eye pain, eye redness, ear pain, ringing in the ears, wax buildup, runny nose, bloody nose. Respiratory: Pt reports cough. Denies difficulty breathing, shortness of breath, or sputum production.   Cardiovascular: Denies chest pain, chest tightness, palpitations or swelling in the hands or feet.  Gastrointestinal: Denies abdominal pain, bloating, constipation, diarrhea or blood in the stool.  Musculoskeletal: Denies decrease in range of motion, difficulty with gait, muscle pain or joint pain and swelling.  Neurological: Denies dizziness, difficulty with memory, difficulty with speech or  problems with balance and coordination.    No other specific complaints in a complete review of systems (except as listed in HPI above).      Objective:   Physical Exam  BP (!) 142/82 (BP Location: Left Arm, Patient Position: Sitting, Cuff Size: Normal)   Pulse 66   Temp 98.2 F (36.8 C)   Ht 5\' 8"  (1.727 m)   Wt 196 lb (88.9 kg)   SpO2 99%   BMI 29.80 kg/m   Wt Readings from Last 3 Encounters:  02/17/23 193 lb (87.5 kg)  10/02/22 191 lb (86.6 kg)  02/26/22 185 lb 9.6 oz (84.2 kg)  General: Appears his stated age, overweight, in NAD. Skin: Warm, dry and intact.  HEENT: Head: normal shape and size, no sinus tenderness noted; Eyes: sclera white, no icterus, conjunctiva pink, PERRLA and EOMs intact; Ears: Tm's gray and intact, normal light reflex; Nose: mucosa boggy and moist, septum midline, turbinates swollen; Throat/Mouth: Teeth present, mucosa pink and moist, + PND, no exudate, lesions or ulcerations noted.  Neck: No adenopathy noted. Cardiovascular: Normal rate and rhythm.  Pulmonary/Chest: Normal effort and positive vesicular breath sounds. No respiratory distress. No wheezes, rales or ronchi noted.  Musculoskeletal: No difficulty with gait.  Neurological: Alert and oriented.   BMET    Component Value Date/Time   NA 136 10/02/2022 1359   NA 141 05/10/2015 1609   K 4.3 10/02/2022 1359   CL 100 10/02/2022 1359   CO2 27 10/02/2022 1359   GLUCOSE 95 10/02/2022 1359   BUN 12 10/02/2022 1359   BUN 11 05/10/2015 1609   CREATININE 1.06 10/02/2022 1359   CALCIUM 9.6 10/02/2022 1359   GFRNONAA 66 12/06/2019 0801   GFRAA 76 12/06/2019 0801    Lipid Panel     Component Value Date/Time   CHOL 195 10/02/2022 1359   CHOL 204 (H) 05/10/2015 1609   TRIG 80 10/02/2022 1359   HDL 104 10/02/2022 1359   HDL 101 05/10/2015 1609   CHOLHDL 1.9 10/02/2022 1359   LDLCALC 75 10/02/2022 1359    CBC    Component Value Date/Time   WBC 3.5 (L) 10/02/2022 1359   RBC 4.31  10/02/2022 1359   HGB 13.5 10/02/2022 1359   HCT 40.4 10/02/2022 1359   PLT 179 10/02/2022 1359   MCV 93.7 10/02/2022 1359   MCH 31.3 10/02/2022 1359   MCHC 33.4 10/02/2022 1359   RDW 12.6 10/02/2022 1359   LYMPHSABS 1,589 10/02/2022 1359   MONOABS 0.4 10/25/2018 0524   EOSABS 49 10/02/2022 1359   BASOSABS 21 10/02/2022 1359    Hgb A1C Lab Results  Component Value Date   HGBA1C 6.4 (A) 02/17/2023            Assessment & Plan:  Assessment and Plan    Viral Upper Respiratory Infection with Cough Nasal congestion, cough with colored sputum, and sore throat for 5 days. No fever, chills, or body aches. Negative for COVID and flu. Likely viral etiology. -Start Atrovent nasal spray four times a day. -Over-the-counter Zyrtec 10mg  once a day at bedtime. -If not improved by Wednesday, start Z-Pak (Azithromycin)   Follow-up with your PCP as previously scheduled Nicki Reaper, NP

## 2023-03-10 ENCOUNTER — Other Ambulatory Visit: Payer: Medicare HMO | Admitting: Pharmacist

## 2023-03-10 DIAGNOSIS — I10 Essential (primary) hypertension: Secondary | ICD-10-CM

## 2023-03-10 DIAGNOSIS — Z7985 Long-term (current) use of injectable non-insulin antidiabetic drugs: Secondary | ICD-10-CM

## 2023-03-10 DIAGNOSIS — E1169 Type 2 diabetes mellitus with other specified complication: Secondary | ICD-10-CM

## 2023-03-10 NOTE — Progress Notes (Signed)
03/10/2023 Name: [name]. MRN: [id number] DOB: 05/28/1945  Chief Complaint  Patient presents with   Medication Management   Medication Assistance    [name]. is a 78 y.o. year old male who presented for a telephone visit.   They were referred to the pharmacist by their PCP for assistance in managing diabetes, hypertension, hyperlipidemia, and medication access.      Subjective:   Care Team: Primary Care Provider: [name], DO; Next Scheduled Visit: 08/19/2023    Medication Access/Adherence  Current Pharmacy:  [facility] #[id number] [place], [place] - [address] AT [facility] [address] [place] [address] Phone: [phone] Fax: [phone]  [facility] [id number] [place], [place] - [address] [place] [address] Phone: [phone] Fax: [phone]  [facility] - [place], [place] - [address] [place] [address] Phone: [phone] Fax: [phone]   Patient reports affordability concerns with their medications: No  Patient reports access/transportation concerns to their pharmacy: No  Patient reports adherence concerns with their medications:  No       Reports not currently using his weekly pillbox    From review of chart, note patient seen for Office Visit with NP Nicki Reaper on 02/24/2023 for nasal congestion and cough  Today patient reports symptoms resolved with completing course of azithromycin as prescribed   Diabetes:   Current medications:  Metformin 1000 mg twice daily Trulicity 1.5 mg weekly on Mondays   Previous medication tried: glipizide   Current glucose readings: ranging 90-120     Patient denies hypoglycemic s/sx including dizziness, shakiness, sweating.      Current medication access support: Collaborating with PCP and CPhT to aid patient with re-enrollment in Trulicity patient  assistance program from [place] for 2025 calendar year - Reports has not yet mailed back his application to CPhT, but plans to do so   Hypertension:   Current medications: lisinopril/HCTZ 20-12.5 mg - 1 tablet daily    Patient has a validated, automated, upper arm home BP cuff,    Patient has home upper arm BP monitor, but denies checking recently      Hyperlipidemia/ASCVD Risk Reduction   Current lipid lowering medications: pravastatin 40 mg daily Medications tried in the past: pravastatin 10 mg   Objective:  Lab Results  Component Value Date   HGBA1C 6.4 (A) 02/17/2023    Lab Results  Component Value Date   CREATININE 1.06 10/02/2022   BUN 12 10/02/2022   NA 136 10/02/2022   K 4.3 10/02/2022   CL 100 10/02/2022   CO2 27 10/02/2022    Lab Results  Component Value Date   CHOL 195 10/02/2022   HDL 104 10/02/2022   LDLCALC 75 10/02/2022   TRIG 80 10/02/2022   CHOLHDL 1.9 10/02/2022   BP Readings from Last 3 Encounters:  02/24/23 138/78  02/17/23 (!) 140/78  10/02/22 138/70   Pulse Readings from Last 3 Encounters:  02/24/23 66  02/17/23 78  10/02/22 73     Medications Reviewed Today     Reviewed by Manuela Neptune, RPH-CPP (Pharmacist) on 03/10/23 at 1648  Med List Status: <None>   Medication Order Taking? Sig Documenting Provider Last Dose Status Informant  ACCU-CHEK AVIVA PLUS test strip [id number]  Check sugar up to 2 x daily Althea Charon Netta Neat, DO  Active Pharmacy Records  Accu-Chek FastClix Lancets MISC [id number]  Check sugar up to 2 x daily [name], DO  Active Pharmacy Records  Accu-Chek Softclix Lancets lancets [id number]  Check sugar up to 2 x daily [name], DO  Active Pharmacy Records  acetaminophen (TYLENOL) 325 MG tablet [id number]  Take 2 tablets (650 mg total) by mouth every 6 (six) hours as needed for mild pain or headache (fever >/= 101). Lonia Blood, MD  Active   Blood Glucose Monitoring Suppl  (ACCU-CHEK AVIVA PLUS) w/Device KIT [id number]  Use to check blood sugar up to x 2 daily [name], DO  Active Pharmacy Records  Dulaglutide (TRULICITY) 1.5 MG/0.5ML Ivory Broad [id number] Yes INJECT 1.5 MG (0.5ML) UNDER THE SKIN ONCE A WEEK [name], Netta Neat, DO Taking Active   ipratropium (ATROVENT) 0.06 % nasal spray [id number]  Place 2 sprays into both nostrils 4 (four) times daily. For up to 5-7 days then stop. Lorre Munroe, NP  Active   lisinopril-hydrochlorothiazide (ZESTORETIC) 20-12.5 MG tablet [id number] Yes TAKE 1 TABLET BY MOUTH DAILY Althea Charon Netta Neat, DO Taking Active   metFORMIN (GLUCOPHAGE) 1000 MG tablet [id number] Yes Take 1 tablet (1,000 mg total) by mouth 2 (two) times daily with a meal. Althea Charon, Netta Neat, DO Taking Active   mupirocin ointment (BACTROBAN) 2 % [id number]  Apply 1 application topically 2 (two) times daily. For 1 week as needed for folliculitis infection [name], DO  Active   pravastatin (PRAVACHOL) 40 MG tablet [id number] Yes TAKE 1 TABLET(40 MG) BY MOUTH DAILY [name], DO Taking Active   Selenium Sulfide 2.25 % SHAM [id number]  APPLY EXTERNALLY TO THE AFFECTED AREA DAILY AS NEEDED FOR IRRITATION Althea Charon Netta Neat, DO  Active   sildenafil (REVATIO) 20 MG tablet [id number]  Take 2-5 pills about 30 min prior to sex. [name], DO  Active               Assessment/Plan:   Counsel patient on importance of medication adherence and encourage him to restart using weekly pillbox     Diabetes: - Have counseled on impact of diet and exercise on blood sugar - Counseled on importance of having regular well-balanced meals and limiting carbohydrate portion sizes - Recommend to check glucose, keep log of results and have this record to review at upcoming appointments - Patient to follow up with [facility] as needed for refill of his diabetes testing supplies - Collaborating with PCP  and CPhT to aid patient with re-enrollment in Trulicity patient assistance program from [place] for 2025 calendar year  Patient to mail completed application back to CPhT   Hypertension: - Recommended to check home blood pressure and heart rate, keep log of results and have record to review during upcoming appointments     Hyperlipidemia/ASCVD Risk Reduction: - Currently uncontrolled.  - Encourage patient to continue using weekly pillbox     Follow Up Plan: Clinical Pharmacist to follow up with patient by telephone again on 02/10/2023 at 4:30 PM    Estelle Grumbles, PharmD, Patsy [place], CPP Clinical Pharmacist [facility] [phone]

## 2023-03-10 NOTE — Patient Instructions (Signed)
Goals Addressed             This Visit's Progress    Pharmacy Goals       Our goal A1c is less than 7%. This corresponds with fasting sugars less than 130 and 2 hour after meal sugars less than 180. Please check your blood sugar and keep a record  Please check your home blood pressure, keep a log of the results and bring this with you to your medical appointments.  Our goal bad cholesterol, or LDL, is less than 70 . This is why it is important to continue taking your pravastatin 40 mg daily  Please use your weekly pillbox to organize your medications  Feel free to call me with any questions or concerns. I look forward to our next call!  Lenyx Boody Theordore Cisnero, PharmD, BCACP, CPP Clinical Pharmacist [facility] [place] [phone]          

## 2023-03-27 ENCOUNTER — Telehealth: Payer: Self-pay | Admitting: Pharmacy Technician

## 2023-03-27 DIAGNOSIS — Z5986 Financial insecurity: Secondary | ICD-10-CM

## 2023-03-27 NOTE — Progress Notes (Addendum)
 Pharmacy Medication Assistance Program Note    03/27/2023  Patient ID: [name]., male   DOB: 11/30/1945, 78 y.o.   MRN: [id number]     03/27/2023  Outreach Medication One  Initial Outreach Date (Medication One) 11/11/2022  Manufacturer Medication One Retail buyer Drugs Trulicity  Dose of Trulicity 1.5mg /0.92ml  Type of Radiographer, therapeutic Assistance  Date Application Sent to Patient 11/13/2022  Application Items Requested Application;Proof of Income;Other  Date Application Sent to Prescriber 11/13/2022  Name of Prescriber Saralyn Pilar   Incoming call received from patient in regard to [facility] application for Trulicity. HIPAA verified. Patient informs he went to sign application but was unable to find it and has requested another application be mailed to him.  Placed application in mail to patient today.  ADDENDUM 04/10/2023 Unsuccessful outreach call to patient. HIPAA compliant voicemail left. Was calling to inquire if patient has received the application that was remailed to him on 03/27/23. ADDENDUM 04/15/2023 2nd unsuccessful outreach call to patient. HIPAA compliant voicemail left. Was calling to inquire if patient has received the application that was remailed to him on 03/27/23.   Pattricia Boss, CPhT [name]  Office: [phone] Fax: [phone] Email: [email or web address]

## 2023-04-14 ENCOUNTER — Other Ambulatory Visit: Payer: Medicare HMO

## 2023-04-14 ENCOUNTER — Telehealth: Payer: Self-pay | Admitting: Pharmacist

## 2023-04-14 NOTE — Telephone Encounter (Signed)
   Outreach Note  04/14/2023 Name: Andrue Dini. MRN: [id number] DOB: 08/26/1945  Referred by: [name], DO  Was unable to reach patient via telephone today and have left HIPAA compliant voicemail asking patient to return my call.   Follow Up Plan: Will attempt to reach patient by telephone again within the next month  Estelle Grumbles, PharmD, Patsy [place], CPP Clinical Pharmacist [facility] [phone]

## 2023-04-16 ENCOUNTER — Other Ambulatory Visit (INDEPENDENT_AMBULATORY_CARE_PROVIDER_SITE_OTHER): Admitting: Pharmacist

## 2023-04-16 DIAGNOSIS — Z7984 Long term (current) use of oral hypoglycemic drugs: Secondary | ICD-10-CM

## 2023-04-16 DIAGNOSIS — E1169 Type 2 diabetes mellitus with other specified complication: Secondary | ICD-10-CM

## 2023-04-16 DIAGNOSIS — Z7985 Long-term (current) use of injectable non-insulin antidiabetic drugs: Secondary | ICD-10-CM

## 2023-04-16 DIAGNOSIS — I10 Essential (primary) hypertension: Secondary | ICD-10-CM

## 2023-04-16 NOTE — Patient Instructions (Signed)
Goals Addressed             This Visit's Progress    Pharmacy Goals       Our goal A1c is less than 7%. This corresponds with fasting sugars less than 130 and 2 hour after meal sugars less than 180. Please check your blood sugar and keep a record  Please check your home blood pressure, keep a log of the results and bring this with you to your medical appointments.  Our goal bad cholesterol, or LDL, is less than 70 . This is why it is important to continue taking your pravastatin 40 mg daily  Please use your weekly pillbox to organize your medications  Feel free to call me with any questions or concerns. I look forward to our next call!  Lenyx Boody Theordore Cisnero, PharmD, BCACP, CPP Clinical Pharmacist [facility] [place] [phone]          

## 2023-04-16 NOTE — Progress Notes (Signed)
 04/16/2023 Name: [name]. MRN: [id number] DOB: 02/22/1945  Chief Complaint  Patient presents with   Medication Management   Medication Assistance   Medication Adherence    [name]. is a 78 y.o. year old male who presented for a telephone visit.   They were referred to the pharmacist by their PCP for assistance in managing diabetes, hypertension, hyperlipidemia, and medication access.      Subjective:   Care Team: Primary Care Provider: [name], DO; Next Scheduled Visit: 08/19/2023    Medication Access/Adherence  Current Pharmacy:  [facility] #[id number] [place], [place] - [address] AT [facility] [address] [place] [address] Phone: [phone] Fax: [phone]  [facility] [id number] [place], [place] - [address] [place] [address] Phone: [phone] Fax: [phone]  [facility] - [place], [place] - [address] [place] [address] Phone: [phone] Fax: [phone]   Patient reports affordability concerns with their medications: No  Patient reports access/transportation concerns to their pharmacy: No  Patient reports adherence concerns with their medications:  No       Reports using his weekly pillbox    Diabetes:   Current medications:  Metformin 1000 mg twice daily Trulicity 1.5 mg weekly on Mondays   Previous medication tried: glipizide   Current glucose readings: ranging 90-120     Patient denies hypoglycemic s/sx including dizziness, shakiness, sweating.    Current medication access support: Collaborating with PCP and CPhT to aid patient with re-enrollment in Trulicity patient assistance program from [place] for 2025 calendar year - Reports has not yet mailed back his application to CPhT, but plans to do so today - Reports currently has 2-3 doses of Trulicity remaining   Hypertension:    Current medications: lisinopril/HCTZ 20-12.5 mg - 1 tablet daily    Patient has a validated, automated, upper arm home BP cuff,    Reports last checked on Sunday, recalls reading ~125/85     Hyperlipidemia/ASCVD Risk Reduction   Current lipid lowering medications: pravastatin 40 mg daily Medications tried in the past: pravastatin 10 mg   Objective:  Lab Results  Component Value Date   HGBA1C 6.4 (A) 02/17/2023    Lab Results  Component Value Date   CREATININE 1.06 10/02/2022   BUN 12 10/02/2022   NA 136 10/02/2022   K 4.3 10/02/2022   CL 100 10/02/2022   CO2 27 10/02/2022    Lab Results  Component Value Date   CHOL 195 10/02/2022   HDL 104 10/02/2022   LDLCALC 75 10/02/2022   TRIG 80 10/02/2022   CHOLHDL 1.9 10/02/2022   BP Readings from Last 3 Encounters:  02/24/23 138/78  02/17/23 (!) 140/78  10/02/22 138/70   Pulse Readings from Last 3 Encounters:  02/24/23 66  02/17/23 78  10/02/22 73    Medications Reviewed Today     Reviewed by Manuela Neptune, RPH-CPP (Pharmacist) on 04/16/23 at 1000  Med List Status: <None>   Medication Order Taking? Sig Documenting Provider Last Dose Status Informant  ACCU-CHEK AVIVA PLUS test strip [id number]  Check sugar up to 2 x daily [name], DO  Active Pharmacy Records  Accu-Chek FastClix Lancets MISC [id number]  Check sugar up to 2 x daily [name], DO  Active Pharmacy Records  Accu-Chek Softclix Lancets lancets [id number]  Check sugar  up to 2 x daily [name], DO  Active Pharmacy Records  acetaminophen (TYLENOL) 325 MG tablet [id number]  Take 2 tablets (650 mg total) by mouth every 6 (six) hours as needed for mild pain or headache (fever >/= 101). Lonia Blood, MD  Active   Blood Glucose Monitoring Suppl (ACCU-CHEK AVIVA PLUS) w/Device KIT [id number]  Use to check blood sugar up to x 2 daily [name], DO  Active Pharmacy Records  Dulaglutide (TRULICITY)  1.5 MG/0.5ML Ivory Broad [id number] Yes INJECT 1.5 MG (0.5ML) UNDER THE SKIN ONCE A WEEK [name], Netta Neat, DO Taking Active   ipratropium (ATROVENT) 0.06 % nasal spray [id number]  Place 2 sprays into both nostrils 4 (four) times daily. For up to 5-7 days then stop. Lorre Munroe, NP  Active   lisinopril-hydrochlorothiazide (ZESTORETIC) 20-12.5 MG tablet [id number] Yes TAKE 1 TABLET BY MOUTH DAILY Althea Charon Netta Neat, DO Taking Active   metFORMIN (GLUCOPHAGE) 1000 MG tablet [id number] Yes Take 1 tablet (1,000 mg total) by mouth 2 (two) times daily with a meal. Althea Charon, Netta Neat, DO Taking Active   mupirocin ointment (BACTROBAN) 2 % [id number]  Apply 1 application topically 2 (two) times daily. For 1 week as needed for folliculitis infection [name], DO  Active   pravastatin (PRAVACHOL) 40 MG tablet [id number] Yes TAKE 1 TABLET(40 MG) BY MOUTH DAILY [name], DO Taking Active   Selenium Sulfide 2.25 % SHAM [id number]  APPLY EXTERNALLY TO THE AFFECTED AREA DAILY AS NEEDED FOR IRRITATION Althea Charon Netta Neat, DO  Active   sildenafil (REVATIO) 20 MG tablet [id number]  Take 2-5 pills about 30 min prior to sex. [name], DO  Active               Assessment/Plan:   Encourage patient to continue using weekly pillbox     Diabetes: - Have counseled on impact of diet and exercise on blood sugar - Counseled on importance of having regular well-balanced meals and limiting carbohydrate portion sizes - Recommend to check glucose, keep log of results and have this record to review at upcoming appointments - Patient to follow up with [facility] Order as needed for refill of his diabetes testing supplies - Collaborating with PCP and CPhT to aid patient with re-enrollment in Trulicity patient assistance program from [place] for 2025 calendar year             Patient to mail completed application back to CPhT   Hypertension: - Recommended to check  home blood pressure and heart rate, keep log of results and have record to review during upcoming appointments     Hyperlipidemia/ASCVD Risk Reduction: - Encourage patient to continue using weekly pillbox     Follow Up Plan: Clinical Pharmacist to follow up with patient by telephone again on 05/19/2023 at 4:30 PM    Estelle Grumbles, PharmD, Patsy [place], CPP Clinical Pharmacist [facility] [phone]

## 2023-04-22 ENCOUNTER — Telehealth: Payer: Self-pay | Admitting: Pharmacy Technician

## 2023-04-22 DIAGNOSIS — Z5986 Financial insecurity: Secondary | ICD-10-CM

## 2023-04-22 NOTE — Progress Notes (Signed)
 Pharmacy Medication Assistance Program Note    04/22/2023  Patient ID: Christopher Riffe., male   DOB: Sep 17, 1945, 78 y.o.   MRN: [id number]     03/27/2023 04/22/2023  Outreach Medication One  Initial Outreach Date (Medication One) 11/11/2022   Manufacturer Medication One [employer] Trulicity   Dose of Trulicity 1.5mg /0.31ml   Type of Radiographer, therapeutic Assistance   Date Application Sent to Patient 11/13/2022   Application Items Requested Application;Proof of Income;Other   Date Application Sent to Prescriber 11/13/2022   Name of Prescriber Saralyn Pilar   Date Application Received From Patient  04/22/2023  Application Items Received From Patient  Application;Other  Date Application Received From Provider  03/31/2023  Date Application Submitted to Manufacturer  04/22/2023  Method Application Sent to Manufacturer  Fax    Pattricia Boss, CPhT [place]  Office: [phone] Fax: [phone] Email: [email or web address]

## 2023-04-28 ENCOUNTER — Telehealth: Payer: Self-pay | Admitting: Pharmacy Technician

## 2023-04-28 DIAGNOSIS — Z5986 Financial insecurity: Secondary | ICD-10-CM

## 2023-04-28 NOTE — Progress Notes (Signed)
 Pharmacy Medication Assistance Program Note    04/28/2023  Patient ID: Christopher Fludd., male   DOB: September 19, 1945, 78 y.o.   MRN: [id number]     03/27/2023 04/22/2023 04/28/2023  Outreach Medication One  Initial Outreach Date (Medication One) 11/11/2022    Manufacturer Medication One Print production planner Drugs Trulicity    Dose of Trulicity 1.5mg /0.48ml    Type of Radiographer, therapeutic Assistance    Date Application Sent to Patient 11/13/2022    Application Items Requested Application;Proof of Income;Other    Date Application Sent to Prescriber 11/13/2022    Name of Prescriber Saralyn Pilar    Date Application Received From Patient  04/22/2023   Application Items Received From Patient  Application;Other   Date Application Received From Provider  03/31/2023   Date Application Submitted to Manufacturer  04/22/2023   Method Application Sent to Manufacturer  Fax   Patient Assistance Determination   Approved  Approval Start Date   04/28/2023  Approval End Date   01/21/2024  Additional Outreach Contact   Provider  Contacted Provider   Message     Care coordination call placed to [name] in regard to Trulicity application.  Per [name]'s IVR system, patient is APPROVED 04/28/23-01/21/24. Initial shipment and subsequent refill shipments will process automatically and be delivered to the patient's home. Patient may call [name] to check on shipments by calling [phone].  Will route note to  [facility] Pharmacist Estelle Grumbles notifying her of this information and requesting she notify patient at their next scheduled appointment.  Pattricia Boss, CPhT [place]  Office: [phone] Fax: [phone] Email: [name]

## 2023-05-07 ENCOUNTER — Ambulatory Visit (INDEPENDENT_AMBULATORY_CARE_PROVIDER_SITE_OTHER): Admitting: Family Medicine

## 2023-05-07 ENCOUNTER — Encounter: Payer: Self-pay | Admitting: Family Medicine

## 2023-05-07 VITALS — BP 124/70 | HR 69 | Ht 68.0 in | Wt 186.1 lb

## 2023-05-07 DIAGNOSIS — M79651 Pain in right thigh: Secondary | ICD-10-CM

## 2023-05-07 NOTE — Patient Instructions (Addendum)
 Thank you for coming to the office today.  I do think the area in question is a muscle pain. Can be more of a strain and underlying issue.  START anti inflammatory topical - OTC Voltaren (generic Diclofenac) topical 2-4 times a day as needed for pain swelling of affected joint for 1-2 weeks or longer.  Recommend to start taking Tylenol Extra Strength 500mg  tabs - take 1 to 2 tabs per dose (max 1000mg ) every 6-8 hours for pain (take regularly, don't skip a dose for next 7 days), max 24 hour daily dose is 6 tablets or 3000mg . In the future you can repeat the same everyday Tylenol course for 1-2 weeks at a time.   Okay to take Ibuprofen Advil or Aleve if needed but use these with caution only short term.  Consider muscle relaxant at bedtime if needed, can call back to request.   Please schedule a Follow-up Appointment to: Return if symptoms worsen or fail to improve.  If you have any other questions or concerns, please feel free to call the office or send a message through MyChart. You may also schedule an earlier appointment if necessary.  Additionally, you may be receiving a survey about your experience at our office within a few days to 1 week by e-mail or mail. We value your feedback.  Domingo Friend, DO [facility], [place]

## 2023-05-07 NOTE — Progress Notes (Signed)
 Subjective:    Patient ID: [name]., male    DOB: 1945/05/19, 78 y.o.   MRN: [id number]  [name]. is a 78 y.o. male presenting on 05/07/2023 for Leg Pain  Patient presents for a same day appointment.  HPI  Discussed the use of AI scribe software for clinical note transcription with the patient, who gave verbal consent to proceed.  History of Present Illness   [name]. is a 78 year old male who presents with new onset right thigh pain.  He experiences aching pain in the right outer thigh, which began today while at work. Drives bus for work. The pain is moderate in intensity and intermittent, occurring throughout the day regardless of activity or position. It is localized to the right thigh and does not radiate to the back or down the leg.  No associated numbness, tingling, or loss of sensation. He has noticed some swelling in the area but no rash or skin changes. The pain is not exacerbated by specific movements such as lifting or rotating the leg, nor does it worsen with changes in position from sitting to standing.  He typically manages aches and pains with over-the-counter medications such as Aleve, Advil, or Tylenol. Currently, he is not experiencing pain, but the area remains sore from earlier in the day. He is concerned about the possibility of a blood clot but has not observed significant swelling or other symptoms typically associated with such a condition.  He works as a Midwife, which involves prolonged periods of sitting.         10/17/2022    2:36 PM 02/26/2022    8:30 AM 07/19/2021    3:03 PM  Depression screen PHQ 2/9  Decreased Interest 0 0 0  Down, Depressed, Hopeless 0 0 0  PHQ - 2 Score 0 0 0  Altered sleeping 0 0 0  Tired, decreased energy 0 0 0  Change in appetite 0 0 0  Feeling bad or failure about yourself  0 0 0  Trouble concentrating 0 0 0  Moving slowly or fidgety/restless 0 0 0  Suicidal thoughts 0 0 0  PHQ-9 Score 0 0 0   Difficult doing work/chores Not difficult at all Not difficult at all Not difficult at all       02/26/2022    8:30 AM  GAD 7 : Generalized Anxiety Score  Nervous, Anxious, on Edge 0  Control/stop worrying 0  Worry too much - different things 0  Trouble relaxing 0  Restless 0  Easily annoyed or irritable 0  Afraid - awful might happen 0  Total GAD 7 Score 0  Anxiety Difficulty Not difficult at all    Social History   Tobacco Use   Smoking status: Former    Current packs/day: 0.00    Types: Cigarettes    Quit date: 03/2021    Years since quitting: 2.1   Smokeless tobacco: Never   Tobacco comments:    pack of cigarettes last 1-2 months - doesnt inhale  Vaping Use   Vaping status: Never Used  Substance Use Topics   Alcohol use: Yes    Alcohol/week: 3.0 standard drinks of alcohol    Types: 3 Cans of beer per week    Comment: 1-2 beers week    Drug use: No    Review of Systems Per HPI unless specifically indicated above     Objective:    BP 124/70 (BP Location: Left Arm, Patient Position: Sitting, Cuff  Size: Normal)   Pulse 69   Ht 5\' 8"  (1.727 m)   Wt 186 lb 2 oz (84.4 kg)   SpO2 95%   BMI 28.30 kg/m   Wt Readings from Last 3 Encounters:  05/07/23 186 lb 2 oz (84.4 kg)  02/24/23 196 lb (88.9 kg)  02/17/23 193 lb (87.5 kg)    Physical Exam Vitals and nursing note reviewed.  Constitutional:      General: He is not in acute distress.    Appearance: Normal appearance. He is well-developed. He is not diaphoretic.     Comments: Well-appearing, comfortable, cooperative  HENT:     Head: Normocephalic and atraumatic.  Eyes:     General:        Right eye: No discharge.        Left eye: No discharge.     Conjunctiva/sclera: Conjunctivae normal.  Cardiovascular:     Rate and Rhythm: Normal rate.  Pulmonary:     Effort: Pulmonary effort is normal.  Musculoskeletal:     Right lower leg: No edema (No calf tenderness or edema or erythema).     Left lower leg:  No edema.     Comments: Right lateral upper thigh hypertonicity spasm of lateral thigh muscles. Not provoked on exam. Intact sensation to light touch without numbness in this region.  Hip flexion and extension normal. Standing and laying hip range of motion intact. Internal rotation and external rotation FADIR FABER intact without pain. Acetabular compression negative.   Skin:    General: Skin is warm and dry.     Findings: No erythema or rash.  Neurological:     Mental Status: He is alert and oriented to person, place, and time.  Psychiatric:        Mood and Affect: Mood normal.        Behavior: Behavior normal.        Thought Content: Thought content normal.     Comments: Well groomed, good eye contact, normal speech and thoughts     Results for orders placed or performed in visit on 02/24/23  POC Covid19/Flu A&B Antigen   Collection Time: 02/24/23 11:23 AM  Result Value Ref Range   Influenza A Antigen, POC Negative Negative   Influenza B Antigen, POC Negative Negative   Covid Antigen, POC Negative Negative      Assessment & Plan:   Problem List Items Addressed This Visit   None Visit Diagnoses       Acute thigh pain, right    -  Primary        Right thigh pain Onset today Acute right thigh pain likely due to muscle strain or spasm. No evidence of DVT or nerve impingement based on exam and history. - Recommend OTC topical gel (Voltaren or generic) 2-4 times daily as needed. - Continue acetaminophen as needed. - Advise caution with NSAIDs; use short term if needed. - Provided quadriceps stretching exercises. - Consider muscle relaxant at bedtime if pain persists; call if needed. - No x-rays; focus on muscle strain treatment.        No orders of the defined types were placed in this encounter.   No orders of the defined types were placed in this encounter.   Follow up plan: Return if symptoms worsen or fail to improve.   Saralyn Pilar, DO [facility] 05/07/2023, 4:27 PM

## 2023-05-17 ENCOUNTER — Other Ambulatory Visit: Payer: Self-pay | Admitting: Family Medicine

## 2023-05-17 DIAGNOSIS — I10 Essential (primary) hypertension: Secondary | ICD-10-CM

## 2023-05-19 ENCOUNTER — Other Ambulatory Visit (INDEPENDENT_AMBULATORY_CARE_PROVIDER_SITE_OTHER): Admitting: Pharmacist

## 2023-05-19 ENCOUNTER — Other Ambulatory Visit: Payer: Self-pay

## 2023-05-19 DIAGNOSIS — Z7985 Long-term (current) use of injectable non-insulin antidiabetic drugs: Secondary | ICD-10-CM

## 2023-05-19 DIAGNOSIS — E1169 Type 2 diabetes mellitus with other specified complication: Secondary | ICD-10-CM

## 2023-05-19 DIAGNOSIS — I10 Essential (primary) hypertension: Secondary | ICD-10-CM

## 2023-05-19 MED ORDER — LISINOPRIL-HYDROCHLOROTHIAZIDE 20-12.5 MG PO TABS
1.0000 | ORAL_TABLET | Freq: Every day | ORAL | 3 refills | Status: AC
Start: 2023-05-19 — End: ?

## 2023-05-19 NOTE — Patient Instructions (Signed)
Goals Addressed             This Visit's Progress    Pharmacy Goals       Our goal A1c is less than 7%. This corresponds with fasting sugars less than 130 and 2 hour after meal sugars less than 180. Please check your blood sugar and keep a record  Please check your home blood pressure, keep a log of the results and bring this with you to your medical appointments.  Our goal bad cholesterol, or LDL, is less than 70 . This is why it is important to continue taking your pravastatin 40 mg daily  Please use your weekly pillbox to organize your medications  Feel free to call me with any questions or concerns. I look forward to our next call!  Lenyx Boody Theordore Cisnero, PharmD, BCACP, CPP Clinical Pharmacist [facility] [place] [phone]          

## 2023-05-19 NOTE — Telephone Encounter (Signed)
 Too soon for refill.  Requested Prescriptions  Pending Prescriptions Disp Refills   lisinopril -hydrochlorothiazide  (ZESTORETIC ) 20-12.5 MG tablet [Pharmacy Med Name: LISINOPRIL -HCTZ 20/12.5MG  TABLETS] 90 tablet 3    Sig: TAKE 1 TABLET BY MOUTH DAILY     Cardiovascular:  ACEI + Diuretic Combos Failed - 05/19/2023  3:52 PM      Failed - Na in normal range and within 180 days    Sodium  Date Value Ref Range Status  10/02/2022 136 135 - 146 mmol/L Final  05/10/2015 141 134 - 144 mmol/L Final         Failed - K in normal range and within 180 days    Potassium  Date Value Ref Range Status  10/02/2022 4.3 3.5 - 5.3 mmol/L Final         Failed - Cr in normal range and within 180 days    Creat  Date Value Ref Range Status  10/02/2022 1.06 0.70 - 1.28 mg/dL Final   Creatinine, POC  Date Value Ref Range Status  04/05/2015 0 mg/dL Final   Creatinine, Urine  Date Value Ref Range Status  10/02/2022 90 20 - 320 mg/dL Final         Failed - eGFR is 30 or above and within 180 days    GFR, Est African American  Date Value Ref Range Status  12/06/2019 76 > OR = 60 mL/min/1.14m2 Final   GFR, Est Non African American  Date Value Ref Range Status  12/06/2019 66 > OR = 60 mL/min/1.62m2 Final   eGFR  Date Value Ref Range Status  10/02/2022 73 > OR = 60 mL/min/1.72m2 Final         Passed - Patient is not pregnant      Passed - Last BP in normal range    BP Readings from Last 1 Encounters:  05/07/23 124/70         Passed - Valid encounter within last 6 months    Recent Outpatient Visits           1 week ago Acute thigh pain, right   [facility] Raina Bunting, DO   2 months ago Acute cough   [facility] [place], Rankin Buzzard, NP       Future Appointments             In 3 months Romeo Co, Kayleen Party, DO  [facility], [facility]

## 2023-05-19 NOTE — Progress Notes (Signed)
 05/19/2023 Name: [name]. MRN: [id number] DOB: November 22, 1945  Chief Complaint  Patient presents with   Medication Management   Medication Assistance    Carmeron Gettings. is a 78 y.o. year old male who presented for a telephone visit.   They were referred to the pharmacist by their PCP for assistance in managing diabetes, hypertension, hyperlipidemia, and medication access.      Subjective:   Care Team: Primary Care Provider: Raina Bunting, DO; Next Scheduled Visit: 08/19/2023    Medication Access/Adherence  Current Pharmacy:  [id number] [place], [place] - [address] AT [facility] [address] [place] [address] Phone: [phone] Fax: [phone]  [facility] [id number] [place], [place] - [address] [place] [address] Phone: [phone] Fax: [phone]  [facility] [place], [place] - [address] [place] [address] Phone: [phone] Fax: [phone]   Patient reports affordability concerns with their medications: No  Patient reports access/transportation concerns to their pharmacy: No  Patient reports adherence concerns with their medications:  No       Reports using his weekly pillbox    Diabetes:   Current medications:  Metformin  1000 mg twice daily Trulicity  1.5 mg weekly on Mondays   Previous medication tried: glipizide    Current glucose readings: ranging 90-130     Patient denies hypoglycemic s/sx including dizziness, shakiness, sweating.    Current medication access support: Enrolled in Trulicity  patient assistance program from [employer] for 2025 calendar year    Hypertension:   Current medications: lisinopril /HCTZ 20-12.5 mg - 1 tablet daily    Patient has an automated, upper arm home BP cuff,    Denies checking home blood pressure recently  Denies signs of hypotension such as dizziness  or lightheadedness     Hyperlipidemia/ASCVD Risk Reduction   Current lipid lowering medications: pravastatin  40 mg daily Medications tried in the past: pravastatin  10 mg   Objective:  Lab Results  Component Value Date   HGBA1C 6.4 (A) 02/17/2023    Lab Results  Component Value Date   CREATININE 1.06 10/02/2022   BUN 12 10/02/2022   NA 136 10/02/2022   K 4.3 10/02/2022   CL 100 10/02/2022   CO2 27 10/02/2022    Lab Results  Component Value Date   CHOL 195 10/02/2022   HDL 104 10/02/2022   LDLCALC 75 10/02/2022   TRIG 80 10/02/2022   CHOLHDL 1.9 10/02/2022   BP Readings from Last 3 Encounters:  05/07/23 124/70  02/24/23 138/78  02/17/23 (!) 140/78   Pulse Readings from Last 3 Encounters:  05/07/23 69  02/24/23 66  02/17/23 78     Medications Reviewed Today     Reviewed by Ardis Becton, RPH-CPP (Pharmacist) on 05/19/23 at 1639  Med List Status: <None>   Medication Order Taking? Sig Documenting Provider Last Dose Status Informant  ACCU-CHEK AVIVA PLUS test strip [id number]  Check sugar up to 2 x daily Raina Bunting, DO  Active Pharmacy Records  Accu-Chek FastClix Lancets MISC [id number]  Check sugar up to 2 x daily Raina Bunting, DO  Active Pharmacy Records  Accu-Chek Softclix Lancets lancets [id number]  Check sugar up to 2 x daily Raina Bunting, DO  Active Pharmacy Records  acetaminophen  (TYLENOL ) 325 MG tablet [id number]  Take 2 tablets (650 mg total) by mouth every  6 (six) hours as needed for mild pain or headache (fever >/= 101). Abbe Abate, MD  Active   Blood Glucose Monitoring Suppl (ACCU-CHEK AVIVA PLUS) w/Device KIT [id number]  Use to check blood sugar up to x 2 daily Raina Bunting, DO  Active Pharmacy Records  Dulaglutide  (TRULICITY ) 1.5 MG/0.5ML [place] [id number] Yes INJECT 1.5 MG (0.5ML) UNDER THE SKIN ONCE A WEEK [name], Kayleen Party, DO Taking Active   ipratropium (ATROVENT ) 0.06 % nasal spray  [id number]  Place 2 sprays into both nostrils 4 (four) times daily. For up to 5-7 days then stop. Carollynn Cirri, NP  Active   lisinopril -hydrochlorothiazide  (ZESTORETIC ) 20-12.5 MG tablet [id number] Yes Take 1 tablet by mouth daily. Raina Bunting, DO Taking Active   metFORMIN  (GLUCOPHAGE ) 1000 MG tablet [id number] Yes Take 1 tablet (1,000 mg total) by mouth 2 (two) times daily with a meal. Romeo Co, Kayleen Party, DO Taking Active   mupirocin  ointment (BACTROBAN ) 2 % [id number]  Apply 1 application topically 2 (two) times daily. For 1 week as needed for folliculitis infection Raina Bunting, DO  Active   pravastatin  (PRAVACHOL ) 40 MG tablet [id number] Yes TAKE 1 TABLET(40 MG) BY MOUTH DAILY Raina Bunting, DO Taking Active   Selenium  Sulfide 2.25 % SHAM [id number]  APPLY EXTERNALLY TO THE AFFECTED AREA DAILY AS NEEDED FOR IRRITATION Romeo Co, Kayleen Party, DO  Active   sildenafil  (REVATIO ) 20 MG tablet [id number]  Take 2-5 pills about 30 min prior to sex. Raina Bunting, DO  Active               Assessment/Plan:   Encourage patient to continue using weekly pillbox     Diabetes: - Have counseled on impact of diet and exercise on blood sugar - Counseled on importance of having regular well-balanced meals and limiting carbohydrate portion sizes - Recommend to check glucose, keep log of results and have this record to review at upcoming appointments - Patient to follow up with [facility] Order as needed for refill of his diabetes testing supplies - Patient to follow up with [name] patient assistance program as needed for refills of Trulicity    Hypertension: - Recommended to check home blood pressure and heart rate, keep log of results and have record to review during upcoming appointments     Hyperlipidemia/ASCVD Risk Reduction: - Encourage patient to continue using weekly pillbox     Follow Up Plan: Clinical Pharmacist to follow up with patient  by telephone again on 11/10/2023 at 4:30 PM    Arthur Lash, PharmD, Becky Bowels, CPP Clinical Pharmacist [facility] [phone]

## 2023-05-20 ENCOUNTER — Telehealth: Payer: Self-pay | Admitting: Pharmacy Technician

## 2023-05-20 DIAGNOSIS — Z5986 Financial insecurity: Secondary | ICD-10-CM

## 2023-05-20 NOTE — Progress Notes (Signed)
   05/20/2023  Patient ID: Christopher Haws., male   DOB: Dec 11, 1945, 78 y.o.   MRN: [id number]  Incoming call received from patient. HIPAA verified. Patient wanted to inform that he had more Trulicity  onhand than he thought he had when he spoke to [facility] PharmD Arthur Lash yesterday. He informs he has 3-4 boxes.  He also informs he received a letter from [place] stating he was NOT approved.  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  Called [name] back and was informed via their IVR system that patient is approved as of 4/7. Informed him to call me back if letter dated after 4/7 but if dated before then felt confident that he could disregard it.  Will route note to [facility] PharmD Arthur Lash as Arlie Lain.  [name], CPhT [place]  Office: [phone] Fax: [phone] Email: [email or web address]

## 2023-08-12 ENCOUNTER — Other Ambulatory Visit: Payer: Self-pay

## 2023-08-15 ENCOUNTER — Other Ambulatory Visit

## 2023-08-16 LAB — COMPLETE METABOLIC PANEL WITHOUT GFR
AG Ratio: 1.5 (calc) (ref 1.0–2.5)
ALT: 12 U/L (ref 9–46)
AST: 16 U/L (ref 10–35)
Albumin: 4.1 g/dL (ref 3.6–5.1)
Alkaline phosphatase (APISO): 50 U/L (ref 35–144)
BUN: 12 mg/dL (ref 7–25)
CO2: 25 mmol/L (ref 20–32)
Calcium: 9.2 mg/dL (ref 8.6–10.3)
Chloride: 101 mmol/L (ref 98–110)
Creat: 0.99 mg/dL (ref 0.70–1.28)
Globulin: 2.8 g/dL (ref 1.9–3.7)
Glucose, Bld: 135 mg/dL — ABNORMAL HIGH (ref 65–99)
Potassium: 4.3 mmol/L (ref 3.5–5.3)
Sodium: 137 mmol/L (ref 135–146)
Total Bilirubin: 0.8 mg/dL (ref 0.2–1.2)
Total Protein: 6.9 g/dL (ref 6.1–8.1)

## 2023-08-16 LAB — CBC WITH DIFFERENTIAL/PLATELET
Absolute Lymphocytes: 1684 {cells}/uL (ref 850–3900)
Absolute Monocytes: 355 {cells}/uL (ref 200–950)
Basophils Absolute: 19 {cells}/uL (ref 0–200)
Basophils Relative: 0.5 %
Eosinophils Absolute: 41 {cells}/uL (ref 15–500)
Eosinophils Relative: 1.1 %
HCT: 41 % (ref 38.5–50.0)
Hemoglobin: 13.7 g/dL (ref 13.2–17.1)
MCH: 31.8 pg (ref 27.0–33.0)
MCHC: 33.4 g/dL (ref 32.0–36.0)
MCV: 95.1 fL (ref 80.0–100.0)
MPV: 9.3 fL (ref 7.5–12.5)
Monocytes Relative: 9.6 %
Neutro Abs: 1602 {cells}/uL (ref 1500–7800)
Neutrophils Relative %: 43.3 %
Platelets: 165 Thousand/uL (ref 140–400)
RBC: 4.31 Million/uL (ref 4.20–5.80)
RDW: 12.6 % (ref 11.0–15.0)
Total Lymphocyte: 45.5 %
WBC: 3.7 Thousand/uL — ABNORMAL LOW (ref 3.8–10.8)

## 2023-08-16 LAB — LIPID PANEL
Cholesterol: 206 mg/dL — ABNORMAL HIGH (ref <200)
HDL: 111 mg/dL (ref >=40)
LDL Cholesterol (Calc): 79 mg/dL
Non-HDL Cholesterol (Calc): 95 mg/dL (ref <130)
Total CHOL/HDL Ratio: 1.9 (calc) (ref <5.0)
Triglycerides: 79 mg/dL (ref <150)

## 2023-08-16 LAB — MICROALBUMIN / CREATININE URINE RATIO
Creatinine, Urine: 192 mg/dL (ref 20–320)
Microalb Creat Ratio: 9 mg/g{creat} (ref <30)
Microalb, Ur: 1.8 mg/dL

## 2023-08-16 LAB — HEMOGLOBIN A1C
Hgb A1c MFr Bld: 7 % — ABNORMAL HIGH (ref <5.7)
Mean Plasma Glucose: 154 mg/dL
eAG (mmol/L): 8.5 mmol/L

## 2023-08-16 LAB — PSA: PSA: 0.24 ng/mL (ref <=4.00)

## 2023-08-16 LAB — TSH: TSH: 1.58 m[IU]/L (ref 0.40–4.50)

## 2023-08-19 ENCOUNTER — Ambulatory Visit (INDEPENDENT_AMBULATORY_CARE_PROVIDER_SITE_OTHER): Payer: Self-pay | Admitting: Family Medicine

## 2023-08-19 ENCOUNTER — Encounter: Payer: Self-pay | Admitting: Family Medicine

## 2023-08-19 VITALS — BP 124/70 | HR 76 | Ht 68.0 in | Wt 186.2 lb

## 2023-08-19 DIAGNOSIS — Z23 Encounter for immunization: Secondary | ICD-10-CM | POA: Diagnosis not present

## 2023-08-19 DIAGNOSIS — Z0000 Encounter for general adult medical examination without abnormal findings: Secondary | ICD-10-CM | POA: Diagnosis not present

## 2023-08-19 DIAGNOSIS — Z7985 Long-term (current) use of injectable non-insulin antidiabetic drugs: Secondary | ICD-10-CM

## 2023-08-19 DIAGNOSIS — N529 Male erectile dysfunction, unspecified: Secondary | ICD-10-CM

## 2023-08-19 DIAGNOSIS — E1169 Type 2 diabetes mellitus with other specified complication: Secondary | ICD-10-CM

## 2023-08-19 DIAGNOSIS — Z1211 Encounter for screening for malignant neoplasm of colon: Secondary | ICD-10-CM | POA: Diagnosis not present

## 2023-08-19 DIAGNOSIS — E785 Hyperlipidemia, unspecified: Secondary | ICD-10-CM

## 2023-08-19 MED ORDER — PRAVASTATIN SODIUM 40 MG PO TABS: 40.0000 mg | ORAL_TABLET | Freq: Every day | ORAL | 3 refills | Status: AC

## 2023-08-19 MED ORDER — SILDENAFIL CITRATE 20 MG PO TABS
ORAL_TABLET | ORAL | 5 refills | Status: DC
Start: 2023-08-19 — End: 2023-12-04

## 2023-08-19 NOTE — Patient Instructions (Addendum)
 Thank you for coming to the office today.  Your provider would like to you have your annual eye exam. Please contact your current eye doctor or here are some good options for you to contact.   [facility]   Address: [address] [place], [place] [address] Phone: [phone]  Website: [email or web address]  Recent Labs    10/02/22 1331 02/17/23 0856 08/15/23 0853  HGBA1C 6.4* 6.4* 7.0*    Refilled Pravastatin   Prevnar 20 pneumonia vaccine today    Please schedule a Follow-up Appointment to: Return in about 6 months (around 02/19/2024) for 6 month DM A1c.  If you have any other questions or concerns, please feel free to call the office or send a message through MyChart. You may also schedule an earlier appointment if necessary.  Additionally, you may be receiving a survey about your experience at our office within a few days to 1 week by e-mail or mail. We value your feedback.  Marsa Officer, DO [facility], [place]

## 2023-08-19 NOTE — Progress Notes (Unsigned)
 Subjective:    Patient ID: [name]., male    DOB: [birth year], 78 y.o.   MRN: [id number]  [name]. is a 78 y.o. male presenting on 08/19/2023 for Annual Exam   HPI  Discussed the use of AI scribe software for clinical note transcription with the patient, who gave verbal consent to proceed.  History of Present Illness   Here for Annual Physical and Lab Review   CHRONIC DM, Type 2 w/ Neuropathy See prior notes for background info Doing well. No new major concerns. No further significant hypoglycemia readings.  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 Tolerating well w/o side-effects Currently on ACEi Lifestyle: Weight stable - Diet (improve diet) - Exercise (Limited exercise due to time - gradually improving) - Dr Mevelyn last DM Eye exam 11/15/21 no DM Retinopathy Denies hypoglycemia, polyuria, visual changes, numbness or tingling.   CHRONIC HTN: Controlled BP Current Meds - Lisinopril -HCTZ 20-12.5mg  - whole tab pill daily Reports good compliance, took meds today. Tolerating well, w/o complaints. Admits can have episodic headache flare with BP elevated. Denies CP, dyspnea, HA, edema, dizziness / lightheadedness    HYPERLIPIDEMIA: - Reports no concerns. Last lipid panel 2024, controlled  - Currently taking Pravastatin  40mg , tolerating well without side effects or myalgias     The patient's weight has fluctuated slightly over the past year, with a slight increase over the past six months. He has not been actively working on any specific diet or exercise strategies. The patient's current weight places him just under the obesity range with a BMI of 29.   The patient also reports a slight soreness in his wrist, which he attributes to driving. He has considered using a brace or splint for support.   The patient  has been compliant with his medication regimen and reports no side effects. He has not noticed any trends of increasing blood pressure, attributing a slightly elevated reading at the current visit to rushing from work.       Health Maintenance:  Prevnar-20 vaccine today, previously Pneumovax23 2016  Cologuard 12/20/20 - negative. Repeat 11/2023     10/17/2022    2:36 PM 02/26/2022    8:30 AM 07/19/2021    3:03 PM  Depression screen PHQ 2/9  Decreased Interest 0 0 0  Down, Depressed, Hopeless 0 0 0  PHQ - 2 Score 0 0 0  Altered sleeping 0 0 0  Tired, decreased energy 0 0 0  Change in appetite 0 0 0  Feeling bad or failure about yourself  0 0 0  Trouble concentrating 0 0 0  Moving slowly or fidgety/restless 0 0 0  Suicidal thoughts 0 0 0  PHQ-9 Score 0 0 0  Difficult doing work/chores Not difficult at all Not difficult at all Not difficult at all       02/26/2022    8:30 AM  GAD 7 : Generalized Anxiety Score  Nervous, Anxious, on Edge 0  Control/stop worrying 0  Worry too much - different things 0  Trouble relaxing 0  Restless 0  Easily annoyed or irritable 0  Afraid - awful might happen 0  Total GAD 7 Score 0  Anxiety Difficulty Not difficult at all     Past Medical History:  Diagnosis Date   Erectile dysfunction    Hypertension    Right-sided  chest pain    Past Surgical History:  Procedure Laterality Date   ROTATOR CUFF REPAIR Right    Social History   Socioeconomic History   Marital status: Divorced    Spouse name: Not on file   Number of children: 2   Years of education: Not on file   Highest education level: Not on file  Occupational History   Occupation: bus driver  Tobacco Use   Smoking status: Former    Current packs/day: 0.00    Types: Cigarettes    Quit date: 03/2021    Years since quitting: 2.4   Smokeless tobacco: Never   Tobacco comments:    pack of cigarettes last 1-2 months - doesnt inhale  Vaping Use   Vaping status: Never Used   Substance and Sexual Activity   Alcohol use: Yes    Alcohol/week: 3.0 standard drinks of alcohol    Types: 3 Cans of beer per week    Comment: 1-2 beers week    Drug use: No   Sexual activity: Not on file  Other Topics Concern   Not on file  Social History Narrative   Working 1-2 days a week .    Social Drivers of Corporate investment banker Strain: Low Risk  (10/17/2022)   Overall Financial Resource Strain (CARDIA)    Difficulty of Paying Living Expenses: Not hard at all  Food Insecurity: No Food Insecurity (10/17/2022)   Hunger Vital Sign    Worried About Running Out of Food in the Last Year: Never true    Ran Out of Food in the Last Year: Never true  Transportation Needs: No Transportation Needs (10/17/2022)   PRAPARE - Administrator, Civil Service (Medical): No    Lack of Transportation (Non-Medical): No  Physical Activity: Sufficiently Active (10/17/2022)   Exercise Vital Sign    Days of Exercise per Week: 5 days    Minutes of Exercise per Session: 30 min  Stress: No Stress Concern Present (10/17/2022)   [employer] of Occupational Health - Occupational Stress Questionnaire    Feeling of Stress : Not at all  Social Connections: Moderately Isolated (10/17/2022)   Social Connection and Isolation Panel    Frequency of Communication with Friends and Family: More than three times a week    Frequency of Social Gatherings with Friends and Family: Never    Attends Religious Services: More than 4 times per year    Active Member of [employer] or Organizations: No    Attends Banker Meetings: Never    Marital Status: Divorced  Catering manager Violence: Not At Risk (10/17/2022)   Humiliation, Afraid, Rape, and Kick questionnaire    Fear of Current or Ex-Partner: No    Emotionally Abused: No    Physically Abused: No    Sexually Abused: No   Family History  Problem Relation Age of Onset   Cancer Brother        Throat   Current Outpatient Medications on  File Prior to Visit  Medication Sig   ACCU-CHEK AVIVA PLUS test strip Check sugar up to 2 x daily   Accu-Chek FastClix Lancets MISC Check sugar up to 2 x daily   Accu-Chek Softclix Lancets lancets Check sugar up to 2 x daily   acetaminophen  (TYLENOL ) 325 MG tablet Take 2 tablets (650 mg total) by mouth every 6 (six) hours as needed for mild pain or headache (fever >/= 101).   Blood Glucose Monitoring Suppl (ACCU-CHEK AVIVA PLUS)  w/Device KIT Use to check blood sugar up to x 2 daily   Dulaglutide  (TRULICITY ) 1.5 MG/0.5ML SOAJ INJECT 1.5 MG (0.5ML) UNDER THE SKIN ONCE A WEEK   ipratropium (ATROVENT ) 0.06 % nasal spray Place 2 sprays into both nostrils 4 (four) times daily. For up to 5-7 days then stop.   lisinopril -hydrochlorothiazide  (ZESTORETIC ) 20-12.5 MG tablet Take 1 tablet by mouth daily.   metFORMIN  (GLUCOPHAGE ) 1000 MG tablet Take 1 tablet (1,000 mg total) by mouth 2 (two) times daily with a meal.   mupirocin  ointment (BACTROBAN ) 2 % Apply 1 application topically 2 (two) times daily. For 1 week as needed for folliculitis infection   Selenium  Sulfide 2.25 % SHAM APPLY EXTERNALLY TO THE AFFECTED AREA DAILY AS NEEDED FOR IRRITATION   No current facility-administered medications on file prior to visit.    Review of Systems Per HPI unless specifically indicated above     Objective:    BP 124/70 (BP Location: Right Arm, Patient Position: Sitting, Cuff Size: Normal)   Pulse 76   Ht 5' 8 (1.727 m)   Wt 186 lb 4 oz (84.5 kg)   SpO2 94%   BMI 28.32 kg/m   Wt Readings from Last 3 Encounters:  08/19/23 186 lb 4 oz (84.5 kg)  05/07/23 186 lb 2 oz (84.4 kg)  02/24/23 196 lb (88.9 kg)    Physical Exam  Results for orders placed or performed in visit on 08/12/23  TSH   Collection Time: 08/15/23  8:53 AM  Result Value Ref Range   TSH 1.58 0.40 - 4.50 mIU/L  Microalbumin / creatinine urine ratio   Collection Time: 08/15/23  8:53 AM  Result Value Ref Range   Creatinine, Urine 192 20  - 320 mg/dL   Microalb, Ur 1.8 mg/dL   Microalb Creat Ratio 9 <30 mg/g creat  PSA   Collection Time: 08/15/23  8:53 AM  Result Value Ref Range   PSA 0.24 < OR = 4.00 ng/mL  CBC with Differential/Platelet   Collection Time: 08/15/23  8:53 AM  Result Value Ref Range   WBC 3.7 (L) 3.8 - 10.8 Thousand/uL   RBC 4.31 4.20 - 5.80 Million/uL   Hemoglobin 13.7 13.2 - 17.1 g/dL   HCT 58.9 61.4 - 49.9 %   MCV 95.1 80.0 - 100.0 fL   MCH 31.8 27.0 - 33.0 pg   MCHC 33.4 32.0 - 36.0 g/dL   RDW 87.3 88.9 - 84.9 %   Platelets 165 140 - 400 Thousand/uL   MPV 9.3 7.5 - 12.5 fL   Neutro Abs 1,602 1,500 - 7,800 cells/uL   Absolute Lymphocytes 1,684 850 - 3,900 cells/uL   Absolute Monocytes 355 200 - 950 cells/uL   Eosinophils Absolute 41 15 - 500 cells/uL   Basophils Absolute 19 0 - 200 cells/uL   Neutrophils Relative % 43.3 %   Total Lymphocyte 45.5 %   Monocytes Relative 9.6 %   Eosinophils Relative 1.1 %   Basophils Relative 0.5 %  COMPLETE METABOLIC PANEL WITH GFR   Collection Time: 08/15/23  8:53 AM  Result Value Ref Range   Glucose, Bld 135 (H) 65 - 99 mg/dL   BUN 12 7 - 25 mg/dL   Creat 9.00 9.29 - 8.71 mg/dL   BUN/Creatinine Ratio SEE NOTE: 6 - 22 (calc)   Sodium 137 135 - 146 mmol/L   Potassium 4.3 3.5 - 5.3 mmol/L   Chloride 101 98 - 110 mmol/L   CO2 25 20 - 32 mmol/L  Calcium 9.2 8.6 - 10.3 mg/dL   Total Protein 6.9 6.1 - 8.1 g/dL   Albumin 4.1 3.6 - 5.1 g/dL   Globulin 2.8 1.9 - 3.7 g/dL (calc)   AG Ratio 1.5 1.0 - 2.5 (calc)   Total Bilirubin 0.8 0.2 - 1.2 mg/dL   Alkaline phosphatase (APISO) 50 35 - 144 U/L   AST 16 10 - 35 U/L   ALT 12 9 - 46 U/L  Hemoglobin A1c   Collection Time: 08/15/23  8:53 AM  Result Value Ref Range   Hgb A1c MFr Bld 7.0 (H) <5.7 %   Mean Plasma Glucose 154 mg/dL   eAG (mmol/L) 8.5 mmol/L  Lipid panel   Collection Time: 08/15/23  8:53 AM  Result Value Ref Range   Cholesterol 206 (H) <200 mg/dL   HDL 888 > OR = 40 mg/dL   Triglycerides 79  <849 mg/dL   LDL Cholesterol (Calc) 79 mg/dL (calc)   Total CHOL/HDL Ratio 1.9 <5.0 (calc)   Non-HDL Cholesterol (Calc) 95 <869 mg/dL (calc)      Assessment & Plan:   Problem List Items Addressed This Visit     ED (erectile dysfunction) of organic origin   Relevant Medications   sildenafil  (REVATIO ) 20 MG tablet   Hyperlipidemia associated with type 2 diabetes mellitus (HCC)   Relevant Medications   pravastatin  (PRAVACHOL ) 40 MG tablet   sildenafil  (REVATIO ) 20 MG tablet   Type 2 diabetes mellitus with other specified complication (HCC)   Relevant Medications   pravastatin  (PRAVACHOL ) 40 MG tablet   Other Visit Diagnoses       Annual physical exam    -  Primary     Screening for colon cancer       Relevant Orders   Cologuard     Need for Streptococcus pneumoniae vaccination       Relevant Orders   Pneumococcal conjugate vaccine 20-valent     Long-term current use of injectable noninsulin antidiabetic medication            Updated Health Maintenance information ***- Reviewed recent lab results with patient Encouraged improvement to lifestyle with diet and exercise -*** Goal of weight loss  Assessment and Plan Assessment & Plan      Orders Placed This Encounter  Procedures   Pneumococcal conjugate vaccine 20-valent   Cologuard    Meds ordered this encounter  Medications   pravastatin  (PRAVACHOL ) 40 MG tablet    Sig: Take 1 tablet (40 mg total) by mouth at bedtime.    Dispense:  90 tablet    Refill:  3    Add refills   sildenafil  (REVATIO ) 20 MG tablet    Sig: Take 2-5 pills about 30 min prior to sex.    Dispense:  60 tablet    Refill:  5     Follow up plan: Return in about 6 months (around 02/19/2024) for 6 month DM A1c.  Marsa Officer, DO [facility] 08/19/2023, 9:00 AM

## 2023-10-03 ENCOUNTER — Telehealth: Payer: Self-pay | Admitting: Pharmacist

## 2023-10-03 NOTE — Progress Notes (Signed)
   Outreach Note  10/03/2023 Name: [name]. MRN: [id number] DOB: 1945/05/03  Referred by: Edman Marsa PARAS, DO  Receive message from CPhT Kate Simcox advising that she had a voicemail from patient asking about whether he can still receive Trulicity  from [name]. Note CPhT helped patient with re-enrollment for 2025 and per review of her latest note in chart, she last spoke with him on 4/29 and also contacted [name] at that time and was advised that he was approved as of 04/28/2023.   Was unable to reach patient via telephone today.  Will collaborate CPhT Suzen Mall to request that she outreach to [employer] patient assistance on behalf of patient  Sharyle Sia, PharmD, [place], CPP Clinical Pharmacist [facility] [phone]

## 2023-10-06 ENCOUNTER — Other Ambulatory Visit: Payer: Self-pay | Admitting: Pharmacist

## 2023-10-06 DIAGNOSIS — E1169 Type 2 diabetes mellitus with other specified complication: Secondary | ICD-10-CM

## 2023-10-06 DIAGNOSIS — Z7985 Long-term (current) use of injectable non-insulin antidiabetic drugs: Secondary | ICD-10-CM

## 2023-10-06 NOTE — Patient Instructions (Signed)
Goals Addressed             This Visit's Progress    Pharmacy Goals       Our goal A1c is less than 7%. This corresponds with fasting sugars less than 130 and 2 hour after meal sugars less than 180. Please check your blood sugar and keep a record  Please check your home blood pressure, keep a log of the results and bring this with you to your medical appointments.  Our goal bad cholesterol, or LDL, is less than 70 . This is why it is important to continue taking your pravastatin 40 mg daily  Please use your weekly pillbox to organize your medications  Feel free to call me with any questions or concerns. I look forward to our next call!  Lenyx Boody Theordore Cisnero, PharmD, BCACP, CPP Clinical Pharmacist [facility] [place] [phone]          

## 2023-10-06 NOTE — Progress Notes (Signed)
   10/06/2023  Patient ID: [name]., male   DOB: 1945/07/16, 78 y.o.   MRN: [id number]  Received message from CPhT Kate Simcox on 9/12 advising that she had a voicemail from patient asking about whether he can still receive Trulicity  from [name].  - CPhT Suzen Mall reached [name] Patient Assistance Program and was advised patient's Trulicity  was not on AUTO ship- previously- it is NOW and last shipment went to MD office, so next shipment will be delivered to the office on Tues. Sept. 16th. For a 4 month supply  Outreach to patient today to follow up. Let patient know that next shipment of his Trulicity  from program is expected to arrive to office tomorrow, 9/16  Diabetes:   Current medications:  Metformin  1000 mg twice daily Trulicity  1.5 mg weekly on Mondays   Previous medication tried: glipizide    Current glucose readings: ranging 90s-120   Current medication access support: Enrolled in Trulicity  patient assistance program from [place] for 2025 calendar year  [name] limited today as patient reports that he is not home. Will follow up further as previously scheduled.    Follow Up Plan: Clinical Pharmacist to follow up with patient by telephone again on 11/10/2023 at 4:30 PM    [name], PharmD, [name], [name] Clinical Pharmacist [facility] [phone]

## 2023-10-07 ENCOUNTER — Encounter: Payer: Self-pay | Admitting: Internal Medicine

## 2023-10-07 ENCOUNTER — Ambulatory Visit (INDEPENDENT_AMBULATORY_CARE_PROVIDER_SITE_OTHER): Admitting: Internal Medicine

## 2023-10-07 VITALS — BP 132/84 | HR 67 | Ht 68.0 in | Wt 187.6 lb

## 2023-10-07 DIAGNOSIS — Z20822 Contact with and (suspected) exposure to covid-19: Secondary | ICD-10-CM | POA: Diagnosis not present

## 2023-10-07 DIAGNOSIS — R051 Acute cough: Secondary | ICD-10-CM

## 2023-10-07 DIAGNOSIS — J069 Acute upper respiratory infection, unspecified: Secondary | ICD-10-CM

## 2023-10-07 LAB — POC COVID19 BINAXNOW: SARS Coronavirus 2 Ag: NEGATIVE

## 2023-10-07 NOTE — Progress Notes (Unsigned)
 Subjective:    Patient ID: Christopher Georgina Raddle., male    DOB: 1945/11/16, 78 y.o.   MRN: [id number]  HPI  Discussed the use of AI scribe software for clinical note transcription with the patient, who gave verbal consent to proceed.  Christopher Marinaro. is a 78 year old male who presents with symptoms following exposure to COVID-19.  He has been feeling unwell for about a week after a coworker tested positive for COVID-19. He self-isolated as a precaution.  He experienced a headache when coughing, but it was not persistent. He also had nasal congestion and rhinorrhea, with occasional phlegm production when coughing. No ear pain, sore throat, shortness of breath, nausea, vomiting, diarrhea, fever, chills, or myalgia.  He reports feeling much better now and notes that his cough is almost resolved, describing it as 'back to normal'.  He took Robitussin for his symptoms, which he thought was a common cold or 'summer cold.'       Review of Systems   Past Medical History:  Diagnosis Date   Erectile dysfunction    Hypertension    Right-sided chest pain     Current Outpatient Medications  Medication Sig Dispense Refill   ACCU-CHEK AVIVA PLUS test strip Check sugar up to 2 x daily 200 each 5   Accu-Chek FastClix Lancets MISC Check sugar up to 2 x daily 102 each 3   Accu-Chek Softclix Lancets lancets Check sugar up to 2 x daily 200 each 5   acetaminophen  (TYLENOL ) 325 MG tablet Take 2 tablets (650 mg total) by mouth every 6 (six) hours as needed for mild pain or headache (fever >/= 101).     Blood Glucose Monitoring Suppl (ACCU-CHEK AVIVA PLUS) w/Device KIT Use to check blood sugar up to x 2 daily 1 kit 0   Dulaglutide  (TRULICITY ) 1.5 MG/0.5ML SOAJ INJECT 1.5 MG (0.5ML) UNDER THE SKIN ONCE A WEEK 6 mL 0   ipratropium (ATROVENT ) 0.06 % nasal spray Place 2 sprays into both nostrils 4 (four) times daily. For up to 5-7 days then stop. 15 mL 0   lisinopril -hydrochlorothiazide  (ZESTORETIC ) 20-12.5  MG tablet Take 1 tablet by mouth daily. 90 tablet 3   metFORMIN  (GLUCOPHAGE ) 1000 MG tablet Take 1 tablet (1,000 mg total) by mouth 2 (two) times daily with a meal. 180 tablet 3   mupirocin  ointment (BACTROBAN ) 2 % Apply 1 application topically 2 (two) times daily. For 1 week as needed for folliculitis infection 30 g 1   pravastatin  (PRAVACHOL ) 40 MG tablet Take 1 tablet (40 mg total) by mouth at bedtime. 90 tablet 3   Selenium  Sulfide 2.25 % SHAM APPLY EXTERNALLY TO THE AFFECTED AREA DAILY AS NEEDED FOR IRRITATION 180 mL 0   sildenafil  (REVATIO ) 20 MG tablet Take 2-5 pills about 30 min prior to sex. 60 tablet 5   No current facility-administered medications for this visit.    Allergies  Allergen Reactions   2,4-D Dimethylamine Other (See Comments)    Family History  Problem Relation Age of Onset   Cancer Brother        Throat    Social History   Socioeconomic History   Marital status: Divorced    Spouse name: Not on file   Number of children: 2   Years of education: Not on file   Highest education level: Not on file  Occupational History   Occupation: bus driver  Tobacco Use   Smoking status: Former    Current packs/day: 0.00  Types: Cigarettes    Quit date: 03/2021    Years since quitting: 2.5   Smokeless tobacco: Never   Tobacco comments:    pack of cigarettes last 1-2 months - doesnt inhale  Vaping Use   Vaping status: Never Used  Substance and Sexual Activity   Alcohol use: Yes    Alcohol/week: 3.0 standard drinks of alcohol    Types: 3 Cans of beer per week    Comment: 1-2 beers week    Drug use: No   Sexual activity: Not on file  Other Topics Concern   Not on file  Social History Narrative   Working 1-2 days a week .    Social Drivers of Corporate investment banker Strain: Low Risk  (10/17/2022)   Overall Financial Resource Strain (CARDIA)    Difficulty of Paying Living Expenses: Not hard at all  Food Insecurity: No Food Insecurity (10/17/2022)    Hunger Vital Sign    Worried About Running Out of Food in the Last Year: Never true    Ran Out of Food in the Last Year: Never true  Transportation Needs: No Transportation Needs (10/17/2022)   PRAPARE - Administrator, Civil Service (Medical): No    Lack of Transportation (Non-Medical): No  Physical Activity: Sufficiently Active (10/17/2022)   Exercise Vital Sign    Days of Exercise per Week: 5 days    Minutes of Exercise per Session: 30 min  Stress: No Stress Concern Present (10/17/2022)   [employer] of Occupational Health - Occupational Stress Questionnaire    Feeling of Stress : Not at all  Social Connections: Moderately Isolated (10/17/2022)   Social Connection and Isolation Panel    Frequency of Communication with Friends and Family: More than three times a week    Frequency of Social Gatherings with Friends and Family: Never    Attends Religious Services: More than 4 times per year    Active Member of [employer] or Organizations: No    Attends Banker Meetings: Never    Marital Status: Divorced  Catering manager Violence: Not At Risk (10/17/2022)   Humiliation, Afraid, Rape, and Kick questionnaire    Fear of Current or Ex-Partner: No    Emotionally Abused: No    Physically Abused: No    Sexually Abused: No     Constitutional: Denies fever, malaise, fatigue, headache or abrupt weight changes.  HEENT: Denies eye pain, eye redness, ear pain, ringing in the ears, wax buildup, runny nose, nasal congestion, bloody nose, or sore throat. Respiratory: Patient reports cough.  Denies difficulty breathing, shortness of breath, or sputum production.   Cardiovascular: Denies chest pain, chest tightness, palpitations or swelling in the hands or feet.  Gastrointestinal: Denies abdominal pain, bloating, constipation, diarrhea or blood in the stool.  Musculoskeletal: Denies decrease in range of motion, difficulty with gait, muscle pain or joint pain and swelling.  Skin:  Denies redness, rashes, lesions or ulcercations.  Neurological: Denies dizziness, difficulty with memory, difficulty with speech or problems with balance and coordination.    No other specific complaints in a complete review of systems (except as listed in HPI above).      Objective:   Physical Exam BP 132/84 (BP Location: Left Arm, Patient Position: Sitting, Cuff Size: Normal)   Pulse 67   Ht 5' 8 (1.727 m)   Wt 187 lb 9.6 oz (85.1 kg)   SpO2 99%   BMI 28.52 kg/m   Wt Readings from Last 3  Encounters:  08/19/23 186 lb 4 oz (84.5 kg)  05/07/23 186 lb 2 oz (84.4 kg)  02/24/23 196 lb (88.9 kg)    General: Appears his stated age, well developed, well nourished in NAD. Skin: Warm, dry and intact. No rashes noted. HEENT: Head: normal shape and size, no sinus tenderness noted; Eyes: sclera white, no icterus, conjunctiva pink, PERRLA and EOMs intact; Nose: mucosa pink and moist, septum midline; Throat/Mouth: Teeth present, mucosa pink and moist, no exudate, lesions or ulcerations noted.  Neck: No adenopathy noted. Cardiovascular: Normal rate and rhythm.  Pulmonary/Chest: Normal effort and positive vesicular breath sounds. No respiratory distress. No wheezes, rales or ronchi noted.  Neurological: Alert and oriented.   BMET    Component Value Date/Time   NA 137 08/15/2023 0853   NA 141 05/10/2015 1609   K 4.3 08/15/2023 0853   CL 101 08/15/2023 0853   CO2 25 08/15/2023 0853   GLUCOSE 135 (H) 08/15/2023 0853   BUN 12 08/15/2023 0853   BUN 11 05/10/2015 1609   CREATININE 0.99 08/15/2023 0853   CALCIUM 9.2 08/15/2023 0853   GFRNONAA 66 12/06/2019 0801   GFRAA 76 12/06/2019 0801    Lipid Panel     Component Value Date/Time   CHOL 206 (H) 08/15/2023 0853   CHOL 204 (H) 05/10/2015 1609   TRIG 79 08/15/2023 0853   HDL 111 08/15/2023 0853   HDL 101 05/10/2015 1609   CHOLHDL 1.9 08/15/2023 0853   LDLCALC 79 08/15/2023 0853    CBC    Component Value Date/Time   WBC 3.7  (L) 08/15/2023 0853   RBC 4.31 08/15/2023 0853   HGB 13.7 08/15/2023 0853   HCT 41.0 08/15/2023 0853   PLT 165 08/15/2023 0853   MCV 95.1 08/15/2023 0853   MCH 31.8 08/15/2023 0853   MCHC 33.4 08/15/2023 0853   RDW 12.6 08/15/2023 0853   LYMPHSABS 1,589 10/02/2022 1359   MONOABS 0.4 10/25/2018 0524   EOSABS 41 08/15/2023 0853   BASOSABS 19 08/15/2023 0853    Hgb A1C Lab Results  Component Value Date   HGBA1C 7.0 (H) 08/15/2023            Assessment & Plan:   Assessment and Plan    Viral URI with cough, exposure to COVID Acute cough nearly resolved, likely self-limiting viral infection or allergic reaction. Negative COVID test. -Symptoms have improved, almost resolved at this time -Continue symptomatic treatment OTC with Robitussin if needed - Provided work note for return to work.      Follow-up with your PCP as previously scheduled Angeline Laura, NP

## 2023-10-08 ENCOUNTER — Encounter: Payer: Self-pay | Admitting: Internal Medicine

## 2023-10-08 NOTE — Patient Instructions (Signed)

## 2023-10-13 ENCOUNTER — Encounter: Payer: Self-pay | Admitting: Pharmacist

## 2023-10-13 NOTE — Progress Notes (Unsigned)
   10/13/2023  Patient ID: [name]., male   DOB: 09-16-1945, 78 y.o.   MRN: [id number]  This patient is appearing on a report for being at risk of failing the adherence measure for cholesterol (statin) and hypertension (ACEi/ARB) medications this calendar year.   Medication: lisinopril /HCTZ 20-12.5 mg Last fill date: 90 for 90 day supply  Medication: pravastatin  40 mg Last fill date: 90 for 90 day supply  Left voicemail for patient to return my call at their convenience.  Sharyle Sia, PharmD, [facility] [phone]

## 2023-10-15 ENCOUNTER — Other Ambulatory Visit: Payer: Self-pay | Admitting: Pharmacist

## 2023-10-15 DIAGNOSIS — I10 Essential (primary) hypertension: Secondary | ICD-10-CM

## 2023-10-15 DIAGNOSIS — Z7985 Long-term (current) use of injectable non-insulin antidiabetic drugs: Secondary | ICD-10-CM

## 2023-10-15 DIAGNOSIS — E1169 Type 2 diabetes mellitus with other specified complication: Secondary | ICD-10-CM

## 2023-10-15 NOTE — Progress Notes (Signed)
   10/15/2023  Patient ID: [name]., male   DOB: [birth year], 78 y.o.   MRN: [id number]  This patient is appearing on a report for being at risk of failing the adherence measure for cholesterol (statin) and hypertension (ACEi/ARB) medications this calendar year.   Medication: lisinopril /HCTZ 20-12.5 mg Last fill date: 05/17/2023 for 90 day supply  Medication: pravastatin  40 mg Last fill date: 05/17/2023 for 90 day supply  Outreach to patient by telephone today. Patient admits has not been using weekly pillbox recently.  Encourage patient to restart using weekly pillbox to aid with medication adherence.  Reports will stop by [facility] today to pick up these refills. Outreach to [employer] today on behalf of patient and ask that pharmacy refill his lisinopril /HCTZ and pravastatin , as well as his metformin , prescriptions  Sharyle Sia, PharmD, [facility] [phone]

## 2023-10-23 ENCOUNTER — Ambulatory Visit: Payer: Medicare HMO

## 2023-10-23 ENCOUNTER — Telehealth: Payer: Self-pay

## 2023-10-23 NOTE — Telephone Encounter (Signed)
 Contacted patient on preferred number listed in notes for scheduled AWV. Patient stated this visit was canceled. Will call back to reschedule.

## 2023-11-10 ENCOUNTER — Telehealth: Payer: Self-pay | Admitting: Pharmacist

## 2023-11-10 ENCOUNTER — Other Ambulatory Visit

## 2023-11-10 NOTE — Progress Notes (Signed)
   Outreach Note  11/10/2023 Name: [name]. MRN: [id number] DOB: 1945-08-05  Referred by: Edman Marsa PARAS, DO  Was unable to reach patient via telephone today and have left HIPAA compliant voicemail asking patient to return my call.    Follow Up Plan: Will attempt to reach patient by telephone again within the next 2 weeks  Sharyle Sia, PharmD, JAQUELINE, CPP Clinical Pharmacist [facility] [phone]

## 2023-11-12 ENCOUNTER — Other Ambulatory Visit

## 2023-11-12 ENCOUNTER — Telehealth: Payer: Self-pay | Admitting: Pharmacist

## 2023-11-12 NOTE — Progress Notes (Signed)
   Outreach Note  11/12/2023 Name: [name]. MRN: [id number] DOB: Apr 28, 1945  Referred by: Edman Marsa PARAS, DO  Was unable to reach patient via telephone today and have left HIPAA compliant voicemail asking patient to return my call.    Follow Up Plan: Will attempt to reach patient by telephone again within the next 2 weeks  Sharyle Sia, PharmD, JAQUELINE, CPP Clinical Pharmacist [facility] [phone]

## 2023-11-13 ENCOUNTER — Telehealth: Payer: Self-pay | Admitting: Family Medicine

## 2023-11-13 NOTE — Telephone Encounter (Unsigned)
 Copied from CRM [id number]. Topic: Clinical - Prescription Issue >> Nov 13, 2023  1:20 PM Tiffini S wrote: Reason for CRM: Patient called to follow up on request for a call back from yesterday evening- advised patient can send another message to office about contacting the pharmacy  Please have pharmacy contact patient back at [phone] for patient outreach appointment.

## 2023-12-02 ENCOUNTER — Telehealth: Payer: Self-pay

## 2023-12-02 NOTE — Telephone Encounter (Signed)
 PAP: Patient assistance application for Trulicity through [employer] has been mailed to pt's home address on file. Provider portion of application will be faxed to provider's office.

## 2023-12-03 ENCOUNTER — Telehealth: Payer: Self-pay | Admitting: Family Medicine

## 2023-12-03 NOTE — Telephone Encounter (Signed)
 Left message for patient to return call OK to find out the preferred pharmacy

## 2023-12-03 NOTE — Telephone Encounter (Signed)
 Prescription Request  12/03/2023  LOV: 08/19/2023  What is the name of the medication or equipment? sildenafil  (REVATIO ) 20 MG tablet , BP MEDICATION   Have you contacted your pharmacy to request a refill? Yes   Which pharmacy would you like this sent to?  [id number] - [place], [place] - [address] AT [facility] OF [address] [place] [address] Phone: [phone] Fax: [phone]  [facility] [id number] [place], [place] - [address] [place] [address] [place] [address] Phone: [phone] Fax: [phone]  [facility] - [place], [place] - [address] [place] [address] Phone: [phone] Fax: [phone]    Patient notified that their request is being sent to the clinical staff for review and that they should receive a response within 2 business days.   Please advise at Mobile [phone] (mobile)

## 2023-12-04 ENCOUNTER — Telehealth: Payer: Self-pay | Admitting: Family Medicine

## 2023-12-04 DIAGNOSIS — N529 Male erectile dysfunction, unspecified: Secondary | ICD-10-CM

## 2023-12-04 MED ORDER — SILDENAFIL CITRATE 50 MG PO TABS: 50.0000 mg | ORAL_TABLET | Freq: Every day | ORAL | 3 refills | Status: DC | PRN

## 2023-12-04 NOTE — Telephone Encounter (Signed)
 PT. WANTED TO KNOW IF sildenafil  (REVATIO ) 20 MG tablet IS THE ONLY DOSAGES THIS COME IN? HE WANTED TO KNOW IF HE CAN GET A STRONGER DOSAGE OF sildenafil  (REVATIO ) 20 MG tablet. PT CALL BACK # IS [phone]

## 2023-12-04 NOTE — Telephone Encounter (Signed)
 Called patient. He only takes 1 sildenafil  20mg  tab. He says may need higher dose. I advised he could take more than 20mg , he prefers 1 pill, I sent rx 50mg  to his pharmacy. Use goodrx  Ciella Obi, DO [facility] 12/04/2023, 11:43 AM

## 2023-12-04 NOTE — Addendum Note (Signed)
 Addended by: EDMAN MARSA PARAS on: 12/04/2023 11:43 AM   Modules accepted: Orders

## 2023-12-20 DIAGNOSIS — E785 Hyperlipidemia, unspecified: Secondary | ICD-10-CM | POA: Diagnosis not present

## 2023-12-20 DIAGNOSIS — Z8249 Family history of ischemic heart disease and other diseases of the circulatory system: Secondary | ICD-10-CM | POA: Diagnosis not present

## 2023-12-20 DIAGNOSIS — I10 Essential (primary) hypertension: Secondary | ICD-10-CM | POA: Diagnosis not present

## 2023-12-20 DIAGNOSIS — Z833 Family history of diabetes mellitus: Secondary | ICD-10-CM | POA: Diagnosis not present

## 2023-12-20 DIAGNOSIS — Z7984 Long term (current) use of oral hypoglycemic drugs: Secondary | ICD-10-CM | POA: Diagnosis not present

## 2023-12-20 DIAGNOSIS — N529 Male erectile dysfunction, unspecified: Secondary | ICD-10-CM | POA: Diagnosis not present

## 2023-12-20 DIAGNOSIS — E119 Type 2 diabetes mellitus without complications: Secondary | ICD-10-CM | POA: Diagnosis not present

## 2023-12-29 NOTE — Telephone Encounter (Signed)
 Reached out to patient regarding PAP application Trulicity  and could not leave a v/m both contact phone numbers are full of messages.

## 2024-01-12 ENCOUNTER — Telehealth: Payer: Self-pay

## 2024-01-12 ENCOUNTER — Telehealth: Payer: Self-pay | Admitting: Family Medicine

## 2024-01-12 NOTE — Telephone Encounter (Signed)
 Tried calling patient no answer or VM    Mailbox is full. Per our records indicate 3 refills were sent to [facility] on 12/06/23. Has patient checked with pharmacy?

## 2024-01-12 NOTE — Telephone Encounter (Signed)
 Pt. Is requesting  a new prescription for  sildenafil  pt want to change dose  to 100 MG called into [facility] in [place]

## 2024-01-12 NOTE — Telephone Encounter (Signed)
 Spoke to patient, notified him we would be in the office all day and he can stop by

## 2024-01-12 NOTE — Telephone Encounter (Signed)
 Copied from CRM #[id number]. Topic: General - Other >> Jan 09, 2024  5:06 PM [place] B wrote: Reason for CRM: patient arrived at the office on 01/09/2024 at 4:40pm to pick up his medication that he was told would be left at the front office the door was locked and I called 5xs to office with no answer patient needs his rx Monday please call pt back asap  [phone]   [phone]

## 2024-01-13 ENCOUNTER — Ambulatory Visit (INDEPENDENT_AMBULATORY_CARE_PROVIDER_SITE_OTHER): Admitting: Family Medicine

## 2024-01-13 ENCOUNTER — Encounter: Payer: Self-pay | Admitting: Family Medicine

## 2024-01-13 VITALS — BP 122/80 | HR 69 | Ht 68.0 in | Wt 184.1 lb

## 2024-01-13 DIAGNOSIS — N529 Male erectile dysfunction, unspecified: Secondary | ICD-10-CM | POA: Diagnosis not present

## 2024-01-13 MED ORDER — SILDENAFIL CITRATE 100 MG PO TABS: 100.0000 mg | ORAL_TABLET | Freq: Every day | ORAL | 5 refills | Status: AC | PRN

## 2024-01-13 NOTE — Progress Notes (Signed)
 "  Subjective:    Patient ID: [name]., male    DOB: Mar 26, 1945, 78 y.o.   MRN: [id number]  [name]. is a 78 y.o. male presenting on 01/13/2024 for Erectile Dysfunction   HPI  Discussed the use of AI scribe software for clinical note transcription with the patient, who gave verbal consent to proceed.  History of Present Illness   [name]. is a 78 year old male who presents with a request for a higher dose of sildenafil  due to decreased efficacy of the current dose.  Erectile dysfunction - Sildenafil  50 mg has decreased efficacy; no longer effective for achieving satisfactory erections. - Previously experienced improved results with sildenafil  100 mg. - Requests increase in sildenafil  dose to 100 mg. - Has used a GoodRx coupon in the past to assist with medication cost. - Considering alternative treatments, including vacuum erection device, based on a friend's experience. - No prior consultation with a urologist regarding alternative therapies.          10/07/2023    2:47 PM 10/17/2022    2:36 PM 02/26/2022    8:30 AM  Depression screen PHQ 2/9  Decreased Interest 0 0 0  Down, Depressed, Hopeless 0 0 0  PHQ - 2 Score 0 0 0  Altered sleeping 0 0 0  Tired, decreased energy 0 0 0  Change in appetite 0 0 0  Feeling bad or failure about yourself  0 0 0  Trouble concentrating 0 0 0  Moving slowly or fidgety/restless 0 0 0  Suicidal thoughts 0 0 0  PHQ-9 Score 0  0  0   Difficult doing work/chores Not difficult at all Not difficult at all Not difficult at all     Data saved with a previous flowsheet row definition       10/07/2023    2:47 PM 02/26/2022    8:30 AM  GAD 7 : Generalized Anxiety Score  Nervous, Anxious, on Edge 0 0  Control/stop worrying 0 0  Worry too much - different things 0 0  Trouble relaxing 0 0  Restless 0 0  Easily annoyed or irritable 0 0  Afraid - awful might happen 0 0  Total GAD 7 Score 0 0  Anxiety Difficulty Not difficult  at all Not difficult at all    Social History[1]  Review of Systems Per HPI unless specifically indicated above     Objective:    BP 122/80 (BP Location: Right Arm, Patient Position: Sitting, Cuff Size: Normal)   Pulse 69   Ht 5' 8 (1.727 m)   Wt 184 lb 2 oz (83.5 kg)   SpO2 98%   BMI 28.00 kg/m   Wt Readings from Last 3 Encounters:  01/13/24 184 lb 2 oz (83.5 kg)  10/07/23 187 lb 9.6 oz (85.1 kg)  08/19/23 186 lb 4 oz (84.5 kg)    Physical Exam Vitals and nursing note reviewed.  Constitutional:      General: He is not in acute distress.    Appearance: Normal appearance. He is well-developed. He is not diaphoretic.     Comments: Well-appearing, comfortable, cooperative  HENT:     Head: Normocephalic and atraumatic.  Eyes:     General:        Right eye: No discharge.        Left eye: No discharge.     Conjunctiva/sclera: Conjunctivae normal.  Cardiovascular:     Rate and Rhythm: Normal rate.  Pulmonary:  Effort: Pulmonary effort is normal.  Skin:    General: Skin is warm and dry.     Findings: No erythema or rash.  Neurological:     Mental Status: He is alert and oriented to person, place, and time.  Psychiatric:        Mood and Affect: Mood normal.        Behavior: Behavior normal.        Thought Content: Thought content normal.     Comments: Well groomed, good eye contact, normal speech and thoughts     Results for orders placed or performed in visit on 10/07/23  POC COVID-19 BinaxNow   Collection Time: 10/07/23  1:45 PM  Result Value Ref Range   SARS Coronavirus 2 Ag Negative Negative      Assessment & Plan:   Problem List Items Addressed This Visit     ED (erectile dysfunction) of organic origin - Primary   Relevant Medications   sildenafil  (VIAGRA ) 100 MG tablet   Other Relevant Orders   Ambulatory referral to Urology     Erectile dysfunction Sildenafil  50 mg ineffective. Improved response with sildenafil  100 mg. Interested in additional  treatments and to discuss VED pump  - Prescribed sildenafil  100 mg, 90 pills with refills, as needed. - Provided GoodRx coupon for cost savings. - Referred to urology for VED consultation.        Orders Placed This Encounter  Procedures   Ambulatory referral to Urology    Referral Priority:   Routine    Referral Type:   Consultation    Referral Reason:   Specialty Services Required    Requested Specialty:   Urology    Number of Visits Requested:   1    Meds ordered this encounter  Medications   sildenafil  (VIAGRA ) 100 MG tablet    Sig: Take 1 tablet (100 mg total) by mouth daily as needed for erectile dysfunction.    Dispense:  90 tablet    Refill:  5    [email or web address]    Follow up plan: Return if symptoms worsen or fail to improve.  Marsa Officer, DO [facility] 01/13/2024, 3:45 PM     [1]  Social History Tobacco Use   Smoking status: Former    Current packs/day: 0.00    Types: Cigarettes    Quit date: 03/2021    Years since quitting: 2.8   Smokeless tobacco: Never   Tobacco comments:    pack of cigarettes last 1-2 months - doesnt inhale  Vaping Use   Vaping status: Never Used  Substance Use Topics   Alcohol use: Yes    Alcohol/week: 3.0 standard drinks of alcohol    Types: 3 Cans of beer per week    Comment: 1-2 beers week    Drug use: No   "

## 2024-01-13 NOTE — Patient Instructions (Addendum)
 Thank you for coming to the office today.  Ordered Sildenafil  generic viagra  100mg  90 pill = $44  Www.[email or web address] for more coupons  Referral to discuss the Pump device.  [facility] -1st floor [address] [place],  [place]  [address] Phone: [phone]  Please schedule a Follow-up Appointment to: Return if symptoms worsen or fail to improve.  If you have any other questions or concerns, please feel free to call the office or send a message through MyChart. You may also schedule an earlier appointment if necessary.  Additionally, you may be receiving a survey about your experience at our office within a few days to 1 week by e-mail or mail. We value your feedback.  Marsa Officer, DO [facility], [place]

## 2024-01-20 ENCOUNTER — Telehealth: Payer: Self-pay | Admitting: Family Medicine

## 2024-01-20 ENCOUNTER — Ambulatory Visit: Admitting: Family Medicine

## 2024-01-20 NOTE — Telephone Encounter (Signed)
 I received a packet of FMLA paperwork that has not been started by the patient. He has previously been on FMLA in the past for his chronic health conditions such as diabetes and blood pressure to allow him time to go to doctors appointments and take intermittent leave as needed.  I don't see that he has been actively under FMLA in the past 1-2 years.  Our last physical was July 2025.  He was seen recently for a different unrelated problem.  Can you call him to find out more information on why he is seeking FMLA at this time?  He will need another office visit to document this FMLA paperwork discussion so we can appropriately complete it.  He requested FMLA starting 01/06/24. So likely he is almost overdue already, so will need to get him scheduled as soon as we can if possible.  Marsa Officer, DO [facility] 01/20/2024, 7:10 PM

## 2024-01-21 LAB — COLOGUARD: COLOGUARD: NEGATIVE

## 2024-01-21 NOTE — Telephone Encounter (Signed)
"  Spoke with patient. Appointment scheduled   "

## 2024-01-23 ENCOUNTER — Ambulatory Visit: Payer: Self-pay | Admitting: Internal Medicine

## 2024-01-27 NOTE — Telephone Encounter (Signed)
 Received Provider port.PAP application for Trulicity  ([name]).

## 2024-02-03 ENCOUNTER — Ambulatory Visit: Admitting: Family Medicine

## 2024-02-17 ENCOUNTER — Ambulatory Visit: Admitting: Family Medicine

## 2024-02-17 ENCOUNTER — Ambulatory Visit: Admitting: Urology

## 2024-02-17 ENCOUNTER — Encounter: Payer: Self-pay | Admitting: Urology

## 2024-02-17 ENCOUNTER — Encounter: Payer: Self-pay | Admitting: Family Medicine

## 2024-02-17 VITALS — BP 172/78 | HR 64 | Ht 71.0 in | Wt 185.0 lb

## 2024-02-17 VITALS — BP 148/80 | HR 68 | Ht 71.0 in | Wt 190.4 lb

## 2024-02-17 DIAGNOSIS — Z7985 Long-term (current) use of injectable non-insulin antidiabetic drugs: Secondary | ICD-10-CM

## 2024-02-17 DIAGNOSIS — E113293 Type 2 diabetes mellitus with mild nonproliferative diabetic retinopathy without macular edema, bilateral: Secondary | ICD-10-CM | POA: Diagnosis not present

## 2024-02-17 DIAGNOSIS — Z0279 Encounter for issue of other medical certificate: Secondary | ICD-10-CM

## 2024-02-17 DIAGNOSIS — E782 Mixed hyperlipidemia: Secondary | ICD-10-CM | POA: Diagnosis not present

## 2024-02-17 DIAGNOSIS — I10 Essential (primary) hypertension: Secondary | ICD-10-CM | POA: Diagnosis not present

## 2024-02-17 DIAGNOSIS — N5201 Erectile dysfunction due to arterial insufficiency: Secondary | ICD-10-CM

## 2024-02-17 LAB — POCT GLYCOSYLATED HEMOGLOBIN (HGB A1C): Hemoglobin A1C: 6.9 % — AB (ref 4.0–5.6)

## 2024-02-17 MED ORDER — METFORMIN HCL 1000 MG PO TABS: 1000.0000 mg | ORAL_TABLET | Freq: Two times a day (BID) | ORAL | 3 refills | Status: AC

## 2024-02-17 MED ORDER — TADALAFIL 20 MG PO TABS: ORAL_TABLET | ORAL | 0 refills | Status: AC

## 2024-02-17 NOTE — Progress Notes (Signed)
 "  Subjective:    Patient ID: Christopher Georgina Raddle., male    DOB: [birth year], 79 y.o.   MRN: [id number]  Christopher Lamere. is a 79 y.o. male presenting on 02/17/2024 for Hypertension and Diabetes   HPI  Discussed the use of AI scribe software for clinical note transcription with the patient, who gave verbal consent to proceed.  History of Present Illness   Christopher Colin. is a 79 year old male with diabetes and hypertension who presents for FMLA paperwork related to his chronic conditions.  Diabetes mellitus, Type 2 with retinopathy, neuropathy Last A1c 7.0, due today for A1c Previously w/ Patient assistance program w/ pharmacy for Trulicity , uncertain status now - Uses Trulicity  1.5mg  weekly injections and metformin  1000mg  TWICE A DAY for glycemic control - Metformin  supply is running low needs refill - Last blood glucose check was in July; prefers finger prick method for testing and is due for repeat testing - Experiences vision issues attributed to diabetes - Last diabetic eye visit at [facility] was some time ago, overdue - He has episodic flares with abnormal blood sugar impacting his function, he is on chronic FMLA for episodes of flares of diabetes and hypertension  FMLA update Functional status and work impact - Employed by [employer] - No recent missed work days due to medical conditions. He only needs intermittent FMLA - Requires FMLA documentation for intermittent leave for medical appointments and potential sick days related to diabetes and hypertension flare up only, he can drive when not having flare up    CHRONIC HTN: Elevated BP today - Did not take blood pressure medication today but has it available at home - Uses lisinopril  and hydrochlorothiazide  for blood pressure management, has refills Current Meds - Lisinopril -HCTZ 20-12.5mg  - whole tab pill daily Reports good compliance, took meds today. Tolerating well, w/o complaints. Denies CP, dyspnea, HA, edema, dizziness /  lightheadedness    HYPERLIPIDEMIA: - Reports no concerns. Last lipid panel 2025, controlled  - Currently taking Pravastatin  40mg , tolerating well without side effects or myalgias        02/17/2024    2:17 PM 10/07/2023    2:47 PM 10/17/2022    2:36 PM  Depression screen PHQ 2/9  Decreased Interest 0 0 0  Down, Depressed, Hopeless 0 0 0  PHQ - 2 Score 0 0 0  Altered sleeping 0 0 0  Tired, decreased energy 0 0 0  Change in appetite 0 0 0  Feeling bad or failure about yourself  0 0 0  Trouble concentrating 0 0 0  Moving slowly or fidgety/restless 0 0 0  Suicidal thoughts 0 0 0  PHQ-9 Score 0 0  0   Difficult doing work/chores Not difficult at all Not difficult at all Not difficult at all     Data saved with a previous flowsheet row definition       02/17/2024    2:17 PM 10/07/2023    2:47 PM 02/26/2022    8:30 AM  GAD 7 : Generalized Anxiety Score  Nervous, Anxious, on Edge 0 0  0   Control/stop worrying 0 0  0   Worry too much - different things 0 0  0   Trouble relaxing 0 0  0   Restless 0 0  0   Easily annoyed or irritable 0 0  0   Afraid - awful might happen 0 0  0   Total GAD 7 Score 0 0 0  Anxiety Difficulty  Not difficult at all Not difficult at all Not difficult at all     Data saved with a previous flowsheet row definition    Social History[1]  Review of Systems Per HPI unless specifically indicated above     Objective:    BP (!) 148/80 (BP Location: Left Arm, Cuff Size: Normal) Comment: Without BP medication today  Pulse 68   Ht 5' 11 (1.803 m)   Wt 190 lb 6 oz (86.4 kg)   SpO2 95%   BMI 26.55 kg/m   Wt Readings from Last 3 Encounters:  02/17/24 190 lb 6 oz (86.4 kg)  02/17/24 185 lb (83.9 kg)  01/13/24 184 lb 2 oz (83.5 kg)    Physical Exam Vitals and nursing note reviewed.  Constitutional:      General: He is not in acute distress.    Appearance: He is well-developed. He is not diaphoretic.     Comments: Well-appearing, comfortable,  cooperative  HENT:     Head: Normocephalic and atraumatic.  Eyes:     General:        Right eye: No discharge.        Left eye: No discharge.     Conjunctiva/sclera: Conjunctivae normal.  Neck:     Thyroid: No thyromegaly.  Cardiovascular:     Rate and Rhythm: Normal rate and regular rhythm.     Pulses: Normal pulses.     Heart sounds: Normal heart sounds. No murmur heard. Pulmonary:     Effort: Pulmonary effort is normal. No respiratory distress.     Breath sounds: Normal breath sounds. No wheezing or rales.  Musculoskeletal:        General: Normal range of motion.     Cervical back: Normal range of motion and neck supple.  Lymphadenopathy:     Cervical: No cervical adenopathy.  Skin:    General: Skin is warm and dry.     Findings: No erythema or rash.  Neurological:     Mental Status: He is alert and oriented to person, place, and time. Mental status is at baseline.  Psychiatric:        Behavior: Behavior normal.     Comments: Well groomed, good eye contact, normal speech and thoughts     Results for orders placed or performed in visit on 02/17/24  POCT glycosylated hemoglobin (Hb A1C)   Collection Time: 02/17/24  6:23 PM  Result Value Ref Range   Hemoglobin A1C 6.9 (A) 4.0 - 5.6 %      Assessment & Plan:   Problem List Items Addressed This Visit     Diabetic retinopathy (HCC)   Relevant Medications   metFORMIN  (GLUCOPHAGE ) 1000 MG tablet   Essential hypertension   Mixed hyperlipidemia   Type 2 diabetes mellitus with other specified complication (HCC) - Primary   Relevant Medications   metFORMIN  (GLUCOPHAGE ) 1000 MG tablet   Other Visit Diagnoses       Long-term current use of injectable noninsulin antidiabetic medication         Controlled type 2 diabetes mellitus with both eyes affected by mild nonproliferative retinopathy without macular edema, without long-term current use of insulin  (HCC)       Relevant Medications   metFORMIN  (GLUCOPHAGE ) 1000 MG  tablet        Type 2 diabetes mellitus with mild nonproliferative diabetic retinopathy without macular edema, bilateral Chronic condition requiring ongoing management Episodic flares of blood sugar can impact his health he has been on FMLA previously needs  renewal today A1c today 6.9, stable well controlled - Ensure regular follow-up with ophthalmologist for diabetic retinopathy. Dr Mevelyn, will send referral to optometry - Trulicity  1.5mg  weekly, may need new orders, he was receiving through PAP, will submit new VBCI referral for pharmacy involvement to check status - Refill Metformin  1000mg  TWICE A DAY  Essential hypertension Chronic condition with current blood pressure reading of 150/80 mmHg off medication today Emphasis on  Medication adherence is crucial for effective management. - Ensure adherence to antihypertensive medication regimen. - Continue Lisinopril -hydrochlorothiazide  20-12.5mg  daily, has refills  General health maintenance Discussion of FMLA paperwork for intermittent leave due to chronic conditions Diabetes, Hypertension for flare ups only Previous FMLA has been approved for these conditions, he is overdue today for paperwork, initial date was 01/06/24 through 6 months 07/06/24. - Completed FMLA paperwork for intermittent leave. Will need final signature by patient and then fax to [employer] for FMLA - Scheduled physical exam and blood work for July.      Orders Placed This Encounter  Procedures   Ambulatory referral to Optometry    Referral Priority:   Routine    Referral Type:   Vision Training And Development Officer)    Referral Reason:   Specialty Services Required    Requested Specialty:   Optometry    Number of Visits Requested:   1   AMB Referral VBCI Care Management    Referral Priority:   Routine    Referral Type:   Consultation    Referral Reason:   Care Coordination    Number of Visits Requested:   1   POCT glycosylated hemoglobin (Hb A1C)    Meds ordered this  encounter  Medications   metFORMIN  (GLUCOPHAGE ) 1000 MG tablet    Sig: Take 1 tablet (1,000 mg total) by mouth 2 (two) times daily with a meal.    Dispense:  180 tablet    Refill:  3    Follow up plan: Return for 6 month Annual Physical AM visit with labs after.   Marsa Officer, DO [facility] 02/17/2024, 2:52 PM     [1]  Social History Tobacco Use   Smoking status: Former    Current packs/day: 0.00    Types: Cigarettes    Quit date: 03/2021    Years since quitting: 2.9   Smokeless tobacco: Never   Tobacco comments:    pack of cigarettes last 1-2 months - doesnt inhale  Vaping Use   Vaping status: Never Used  Substance Use Topics   Alcohol use: Yes    Alcohol/week: 3.0 standard drinks of alcohol    Types: 3 Cans of beer per week    Comment: 1-2 beers week    Drug use: No   "

## 2024-02-17 NOTE — Patient Instructions (Addendum)
 Thank you for coming to the office today.  FMLA paperwork today  Please call Dr Mevelyn office to schedule for Diabetic Eye Exam.  [facility]   Address: [address] [place], [place] [address] Phone: [phone]  Website: [email or web address]  A1c check today  Keep taking BP medication regularly, let us  know if issues with high BP readings  DUE for FASTING BLOOD WORK (no food or drink after midnight before the lab appointment, only water or coffee without cream/sugar on the morning of)  SCHEDULE Lab Only visit in the morning at the clinic for lab draw in 6 MONTHS   - Make sure Lab Only appointment is at about 1 week before your next appointment, so that results will be available  For Lab Results, once available within 2-3 days of blood draw, you can can log in to MyChart online to view your results and a brief explanation. Also, we can discuss results at next follow-up visit.   Please schedule a Follow-up Appointment to: Return for 6 month fasting lab > 1 week later Annual Physical.  If you have any other questions or concerns, please feel free to call the office or send a message through MyChart. You may also schedule an earlier appointment if necessary.  Additionally, you may be receiving a survey about your experience at our office within a few days to 1 week by e-mail or mail. We value your feedback.  Marsa Officer, DO [facility], [place]

## 2024-02-17 NOTE — Patient Instructions (Signed)
 [email or web address]

## 2024-02-17 NOTE — Progress Notes (Signed)
 "  02/17/2024 12:15 PM   [name]. 09-10-1945 [id number]  Referring provider: Edman Marsa PARAS, DO [address] [place],  [place] [address]  Chief Complaint  Patient presents with   Erectile Dysfunction    HPI: [name]. is a 79 y.o. male referred for evaluation of erectile dysfunction.  1-2 year history of difficulty achieving and maintaining an erection.  Initially given a trial of sildenafil  50 mg and subsequently increased to 100 mg.  Sildenafil  100 mg has been intermittently effective. Organic risk factors include diabetes, hypertension, antihypertensive medications and prior tobacco history He has a friend who has a Titan penile implant and was inquiring about the procedure.   PMH: Past Medical History:  Diagnosis Date   Erectile dysfunction    Hypertension    Right-sided chest pain     Surgical History: Past Surgical History:  Procedure Laterality Date   ROTATOR CUFF REPAIR Right     Home Medications:  Allergies as of 02/17/2024       Reactions   2,4-d Dimethylamine Other (See Comments)        Medication List        Accurate as of February 17, 2024 12:15 PM. If you have any questions, ask your nurse or doctor.          Accu-Chek Aviva Plus test strip Generic drug: glucose blood Check sugar up to 2 x daily   Accu-Chek Aviva Plus w/Device Kit Use to check blood sugar up to x 2 daily   Accu-Chek FastClix Lancets Misc Check sugar up to 2 x daily   Accu-Chek Softclix Lancets lancets Check sugar up to 2 x daily   acetaminophen  325 MG tablet Commonly known as: TYLENOL  Take 2 tablets (650 mg total) by mouth every 6 (six) hours as needed for mild pain or headache (fever >/= 101).   ipratropium 0.06 % nasal spray Commonly known as: ATROVENT  Place 2 sprays into both nostrils 4 (four) times daily. For up to 5-7 days then stop.   lisinopril -hydrochlorothiazide  20-12.5 MG tablet Commonly known as: ZESTORETIC  Take 1 tablet by mouth  daily.   metFORMIN  1000 MG tablet Commonly known as: GLUCOPHAGE  Take 1 tablet (1,000 mg total) by mouth 2 (two) times daily with a meal.   mupirocin  ointment 2 % Commonly known as: BACTROBAN  Apply 1 application topically 2 (two) times daily. For 1 week as needed for folliculitis infection   pravastatin  40 MG tablet Commonly known as: PRAVACHOL  Take 1 tablet (40 mg total) by mouth at bedtime.   Selenium  Sulfide 2.25 % Sham APPLY EXTERNALLY TO THE AFFECTED AREA DAILY AS NEEDED FOR IRRITATION   sildenafil  100 MG tablet Commonly known as: Viagra  Take 1 tablet (100 mg total) by mouth daily as needed for erectile dysfunction.   tadalafil  20 MG tablet Commonly known as: CIALIS  1 by mouth 1 hour prior to intercourse Started by: Glendia Barba, MD   Trulicity  1.5 MG/0.5ML Soaj Generic drug: Dulaglutide  INJECT 1.5 MG (0.5ML) UNDER THE SKIN ONCE A WEEK        Allergies: Allergies[1]  Family History: Family History  Problem Relation Age of Onset   Cancer Brother        Throat    Social History:  reports that he quit smoking about 2 years ago. His smoking use included cigarettes. He has never used smokeless tobacco. He reports current alcohol use of about 3.0 standard drinks of alcohol per week. He reports that he does not use drugs.  Physical Exam: BP (!) 172/78   Pulse 64   Ht 5' 11 (1.803 m)   Wt 185 lb (83.9 kg)   BMI 25.80 kg/m   Constitutional:  Alert, No acute distress. HEENT: [place] AT Respiratory: Normal respiratory effort, no increased work of breathing. Psychiatric: Normal mood and affect.   Assessment & Plan:    1.  Erectile dysfunction Sildenafil  100 mg partially effective.  He has tried tadalafil  in the past but states it was a few years ago. He was interested in a repeat trial and Rx 20 mg sent to pharmacy.  Discussed he can increase to 40 mg if needed Penile implant surgery was discussed and we currently do not have a provider in our office performing.   Discussed referral to [facility], [facility] or [facility]. Intracavernosal injections were also discussed and he was provided website literature He will call back if interested in intracavernosal injections or referral to discuss penile implant surgery   Glendia JAYSON Barba, MD  [facility] [address] [place], [place] [address] [phone]     [1]  Allergies Allergen Reactions   2,4-D Dimethylamine Other (See Comments)   "

## 2024-02-18 ENCOUNTER — Other Ambulatory Visit: Admitting: Pharmacist

## 2024-02-18 DIAGNOSIS — I10 Essential (primary) hypertension: Secondary | ICD-10-CM

## 2024-02-18 DIAGNOSIS — Z7985 Long-term (current) use of injectable non-insulin antidiabetic drugs: Secondary | ICD-10-CM

## 2024-02-18 DIAGNOSIS — E113293 Type 2 diabetes mellitus with mild nonproliferative diabetic retinopathy without macular edema, bilateral: Secondary | ICD-10-CM

## 2024-02-18 DIAGNOSIS — E782 Mixed hyperlipidemia: Secondary | ICD-10-CM

## 2024-02-18 NOTE — Patient Instructions (Signed)
"   Goals Addressed             This Visit's Progress    Pharmacy Goals       Our goal A1c is less than 7%. This corresponds with fasting sugars less than 130 and 2 hour after meal sugars less than 180. Please check your blood sugar and keep a record  Our goal bad cholesterol, or LDL, is less than 70 . This is why it is important to continue taking your pravastatin  40 mg daily  Please use your weekly pillbox to organize your medications  Check your blood pressure twice weekly, and any time you have concerning symptoms like headache, chest pain, dizziness, shortness of breath, or vision changes.   Our goal is less than 130/80.  To appropriately check your blood pressure, make sure you do the following:  1) Avoid caffeine, exercise, or tobacco products for 30 minutes before checking. Empty your bladder. 2) Sit with your back supported in a flat-backed chair. Rest your arm on something flat (arm of the chair, table, etc). 3) Sit still with your feet flat on the floor, resting, for at least 5 minutes.  4) Check your blood pressure. Take 1-2 readings.  5) Write down these readings and bring with you to any provider appointments.  Bring your home blood pressure machine with you to a provider's office for accuracy comparison at least once a year.   Make sure you take your blood pressure medications before you come to any office visit, even if you were asked to fast for labs.   Feel free to call me with any questions or concerns. I look forward to our next call!  Sharyle Sia, PharmD, JAQUELINE, CPP Clinical Pharmacist [facility] [phone]          "

## 2024-02-18 NOTE — Progress Notes (Signed)
 "  02/18/2024 Name: [name]. MRN: [id number] DOB: 06/13/1945  Chief Complaint  Patient presents with   Medication Assistance   Medication Management   Medication Adherence    [name]. is a 79 y.o. year old male who presented for a telephone visit.   They were referred to the pharmacist by their PCP for assistance in managing diabetes, hypertension, hyperlipidemia, and medication access.      Subjective:   Care Team: Primary Care Provider: Edman Marsa PARAS, DO    Medication Access/Adherence  Current Pharmacy:  [facility] [phone] [place], [place] - [address] AT [facility] OF [address] [place] [address] Phone: [phone] Fax: [phone]  [facility] [id number] [place], [place] - [address] [place] [place] [address] Phone: [phone] Fax: [phone]  [facility] [place], [place] - [address] [place] [address] Phone: [phone] Fax: [phone]   Patient reports affordability concerns with their medications: No  Patient reports access/transportation concerns to their pharmacy: No  Patient reports adherence concerns with their medications:  No       Reports using his weekly pillbox    Diabetes:   Current medications:  Metformin  1000 mg twice daily Trulicity  1.5 mg weekly on Mondays   Previous medication tried: glipizide    Current glucose readings: ranging 90-120     Patient denies hypoglycemic s/sx including dizziness, shakiness, sweating.    Current medication access support: Enrolled in Trulicity  patient assistance program from [name] for 2025 calendar year - Note [facility] has completed provider portion of 2026 re-enrollment application and mailed application to patient on 12/02/2023, but today patient denies having received this. Reports currently has 3 doses of Trulicity  remaining    Hypertension:   Current medications: lisinopril /HCTZ 20-12.5 mg - 1 tablet daily    Patient has an automated, upper arm home BP cuff   Reports last checked home blood pressure on 1/26 and recalls reading ~130/80  Attributes elevated blood pressure reading at Office Visit with PCP yesterday to not having taken his BP medication prior to the appointment   Denies signs of hypotension such as dizziness or lightheadedness     Hyperlipidemia/ASCVD Risk Reduction   Current lipid lowering medications: pravastatin  40 mg daily Medications tried in the past: pravastatin  10 mg   Objective:  Lab Results  Component Value Date   HGBA1C 6.9 (A) 02/17/2024    Lab Results  Component Value Date   CREATININE 0.99 08/15/2023   BUN 12 08/15/2023   NA 137 08/15/2023   K 4.3 08/15/2023   CL 101 08/15/2023   CO2 25 08/15/2023    Lab Results  Component Value Date   CHOL 206 (H) 08/15/2023   HDL 111 08/15/2023   LDLCALC 79 08/15/2023   TRIG 79 08/15/2023   CHOLHDL 1.9 08/15/2023   BP Readings from Last 3 Encounters:  02/17/24 (!) 148/80  02/17/24 (!) 172/78  01/13/24 122/80   Pulse Readings from Last 3 Encounters:  02/17/24 68  02/17/24 64  01/13/24 69     Medications Reviewed Today     Reviewed by Alana Sharyle LABOR, RPH-CPP (Pharmacist) on 02/18/24 at [phone]  Med List Status: <None>   Medication Order Taking? Sig Documenting Provider Last Dose Status Informant  ACCU-CHEK AVIVA PLUS test strip [id number] Yes Check sugar up to 2 x daily Edman Marsa PARAS,  DO  Active Pharmacy Records  Accu-Chek FastClix Lancets MISC [id number] Yes Check sugar up to 2 x daily Edman Marsa PARAS, DO  Active Pharmacy Records  Accu-Chek Softclix Lancets lancets [id number] Yes Check sugar up to 2 x daily Edman Marsa PARAS, DO  Active Pharmacy Records  acetaminophen  (TYLENOL ) 325 MG tablet [id number] Yes Take 2 tablets (650 mg total) by mouth every 6 (six) hours as needed for mild pain  or headache (fever >/= 101). Danton Reyes DASEN, MD  Active   Blood Glucose Monitoring Suppl (ACCU-CHEK AVIVA PLUS) w/Device KIT [id number] Yes Use to check blood sugar up to x 2 daily Edman Marsa PARAS, DO  Active Pharmacy Records  Dulaglutide  (TRULICITY ) 1.5 MG/0.5ML EMMANUEL [id number] Yes INJECT 1.5 MG (0.5ML) UNDER THE SKIN ONCE A WEEK [name], Marsa PARAS, DO  Active   ipratropium (ATROVENT ) 0.06 % nasal spray [id number] Yes Place 2 sprays into both nostrils 4 (four) times daily. For up to 5-7 days then stop. Antonette Angeline ORN, NP  Active   lisinopril -hydrochlorothiazide  (ZESTORETIC ) 20-12.5 MG tablet [id number] Yes Take 1 tablet by mouth daily. Edman Marsa PARAS, DO  Active   metFORMIN  (GLUCOPHAGE ) 1000 MG tablet [id number] Yes Take 1 tablet (1,000 mg total) by mouth 2 (two) times daily with a meal. Edman, Marsa PARAS, DO  Active   mupirocin  ointment (BACTROBAN ) 2 % [id number] Yes Apply 1 application topically 2 (two) times daily. For 1 week as needed for folliculitis infection Edman Marsa PARAS, DO  Active   pravastatin  (PRAVACHOL ) 40 MG tablet [id number] Yes Take 1 tablet (40 mg total) by mouth at bedtime. Edman Marsa PARAS, DO  Active   Selenium  Sulfide 2.25 % SHAM [id number] Yes APPLY EXTERNALLY TO THE AFFECTED AREA DAILY AS NEEDED FOR IRRITATION Edman, Marsa PARAS, DO  Active   sildenafil  (VIAGRA ) 100 MG tablet [id number] Yes Take 1 tablet (100 mg total) by mouth daily as needed for erectile dysfunction. Edman Marsa PARAS, DO  Active   tadalafil  (CIALIS ) 20 MG tablet [id number] Yes 1 by mouth 1 hour prior to intercourse Stoioff, Glendia BROCKS, MD  Active               Assessment/Plan:   Encourage patient to continue using weekly pillbox     Diabetes: - Have counseled on impact of diet and exercise on blood sugar - Counseled on importance of having regular well-balanced meals and limiting carbohydrate portion sizes - Recommend to check glucose, keep  log of results and have this record to review at upcoming appointments - Patient to follow up with [facility] Order as needed for refill of his diabetes testing supplies - Patient to follow up with [name] patient assistance program as needed for refills of Trulicity  - Schedule appointment with patient to complete [name] patient assistance application via online form on 02/23/2024 as requested   Hypertension: - Recommended to check home blood pressure and heart rate, keep log of results and have record to review during upcoming appointments     Hyperlipidemia/ASCVD Risk Reduction: - Encourage patient to continue using weekly pillbox     Follow Up Plan: Clinical Pharmacist to follow up with patient by telephone again on 02/23/2024 at 2:30 PM    Sharyle Sia, PharmD, JAQUELINE, CPP Clinical Pharmacist [facility] [phone]   "

## 2024-02-20 ENCOUNTER — Ambulatory Visit: Admitting: Family Medicine

## 2024-02-23 ENCOUNTER — Other Ambulatory Visit: Admitting: Pharmacist

## 2024-02-23 ENCOUNTER — Ambulatory Visit: Admitting: Family Medicine

## 2024-02-23 DIAGNOSIS — E113293 Type 2 diabetes mellitus with mild nonproliferative diabetic retinopathy without macular edema, bilateral: Secondary | ICD-10-CM

## 2024-02-23 DIAGNOSIS — Z7985 Long-term (current) use of injectable non-insulin antidiabetic drugs: Secondary | ICD-10-CM

## 2024-02-23 MED ORDER — TRULICITY 1.5 MG/0.5ML ~~LOC~~ SOAJ: 1.5000 mg | SUBCUTANEOUS | 5 refills | Status: AC

## 2024-02-23 NOTE — Patient Instructions (Signed)
"   Goals Addressed             This Visit's Progress    Pharmacy Goals       Our goal A1c is less than 7%. This corresponds with fasting sugars less than 130 and 2 hour after meal sugars less than 180. Please check your blood sugar and keep a record  Our goal bad cholesterol, or LDL, is less than 70 . This is why it is important to continue taking your pravastatin  40 mg daily  Please use your weekly pillbox to organize your medications  Check your blood pressure twice weekly, and any time you have concerning symptoms like headache, chest pain, dizziness, shortness of breath, or vision changes.   Our goal is less than 130/80.  To appropriately check your blood pressure, make sure you do the following:  1) Avoid caffeine, exercise, or tobacco products for 30 minutes before checking. Empty your bladder. 2) Sit with your back supported in a flat-backed chair. Rest your arm on something flat (arm of the chair, table, etc). 3) Sit still with your feet flat on the floor, resting, for at least 5 minutes.  4) Check your blood pressure. Take 1-2 readings.  5) Write down these readings and bring with you to any provider appointments.  Bring your home blood pressure machine with you to a provider's office for accuracy comparison at least once a year.   Make sure you take your blood pressure medications before you come to any office visit, even if you were asked to fast for labs.   Feel free to call me with any questions or concerns. I look forward to our next call!  Sharyle Sia, PharmD, JAQUELINE, CPP Clinical Pharmacist [facility] [phone]          "

## 2024-04-30 ENCOUNTER — Other Ambulatory Visit

## 2024-08-16 ENCOUNTER — Encounter: Admitting: Family Medicine
